# Patient Record
Sex: Male | Born: 1945 | Race: White | Hispanic: No | Smoking: Former smoker
Health system: Southern US, Community
[De-identification: ages and names within clinical notes are randomized; demographics above are authoritative.]

## PROBLEM LIST (undated history)

## (undated) DIAGNOSIS — I639 Cerebral infarction, unspecified: Secondary | ICD-10-CM

## (undated) DIAGNOSIS — F419 Anxiety disorder, unspecified: Secondary | ICD-10-CM

## (undated) DIAGNOSIS — G20 Parkinson's disease: Secondary | ICD-10-CM

## (undated) DIAGNOSIS — J449 Chronic obstructive pulmonary disease, unspecified: Secondary | ICD-10-CM

## (undated) DIAGNOSIS — G20A1 Parkinson's disease without dyskinesia, without mention of fluctuations: Secondary | ICD-10-CM

## (undated) DIAGNOSIS — F32A Depression, unspecified: Secondary | ICD-10-CM

## (undated) DIAGNOSIS — R569 Unspecified convulsions: Secondary | ICD-10-CM

## (undated) DIAGNOSIS — F329 Major depressive disorder, single episode, unspecified: Secondary | ICD-10-CM

## (undated) HISTORY — PX: SPINAL FUSION: SHX223

## (undated) HISTORY — PX: TOTAL KNEE ARTHROPLASTY: SHX125

## (undated) HISTORY — PX: OTHER SURGICAL HISTORY: SHX169

---

## 1999-01-18 ENCOUNTER — Inpatient Hospital Stay (HOSPITAL_COMMUNITY): Admission: EM | Admit: 1999-01-18 | Discharge: 1999-01-19 | Payer: Self-pay | Admitting: Emergency Medicine

## 1999-01-19 ENCOUNTER — Encounter: Payer: Self-pay | Admitting: Internal Medicine

## 1999-01-29 ENCOUNTER — Ambulatory Visit (HOSPITAL_COMMUNITY): Admission: RE | Admit: 1999-01-29 | Discharge: 1999-01-29 | Payer: Self-pay | Admitting: Internal Medicine

## 2001-04-30 ENCOUNTER — Inpatient Hospital Stay (HOSPITAL_COMMUNITY): Admission: RE | Admit: 2001-04-30 | Discharge: 2001-05-01 | Payer: Self-pay | Admitting: Neurosurgery

## 2001-04-30 ENCOUNTER — Encounter: Payer: Self-pay | Admitting: Neurosurgery

## 2008-08-29 DIAGNOSIS — N400 Enlarged prostate without lower urinary tract symptoms: Secondary | ICD-10-CM | POA: Diagnosis present

## 2009-02-01 DIAGNOSIS — F1020 Alcohol dependence, uncomplicated: Secondary | ICD-10-CM | POA: Diagnosis present

## 2013-11-28 DIAGNOSIS — G4733 Obstructive sleep apnea (adult) (pediatric): Secondary | ICD-10-CM | POA: Diagnosis present

## 2014-01-21 ENCOUNTER — Inpatient Hospital Stay: Payer: Self-pay | Admitting: Internal Medicine

## 2014-01-21 LAB — URINALYSIS, COMPLETE
Bacteria: NONE SEEN
Bilirubin,UR: NEGATIVE
Blood: NEGATIVE
Glucose,UR: NEGATIVE mg/dL (ref 0–75)
Ketone: NEGATIVE
Leukocyte Esterase: NEGATIVE
Nitrite: NEGATIVE
Ph: 6 (ref 4.5–8.0)
Protein: NEGATIVE
RBC,UR: <1 /HPF (ref 0–5)
Specific Gravity: 1.006 (ref 1.003–1.030)
Squamous Epithelial: NONE SEEN
WBC UR: 1 /HPF (ref 0–5)

## 2014-01-21 LAB — PROTIME-INR
INR: 1.1
Prothrombin Time: 14.1 secs (ref 11.5–14.7)

## 2014-01-21 LAB — CBC
HCT: 40.4 % (ref 40.0–52.0)
HGB: 13.6 g/dL (ref 13.0–18.0)
MCH: 31.9 pg (ref 26.0–34.0)
MCHC: 33.6 g/dL (ref 32.0–36.0)
MCV: 95 fL (ref 80–100)
Platelet: 168 10*3/uL (ref 150–440)
RBC: 4.25 10*6/uL — ABNORMAL LOW (ref 4.40–5.90)
RDW: 13.5 % (ref 11.5–14.5)
WBC: 7.5 10*3/uL (ref 3.8–10.6)

## 2014-01-21 LAB — COMPREHENSIVE METABOLIC PANEL
Albumin: 3.8 g/dL (ref 3.4–5.0)
Alkaline Phosphatase: 96 U/L
Anion Gap: 5 — ABNORMAL LOW (ref 7–16)
BUN: 14 mg/dL (ref 7–18)
Bilirubin,Total: 0.3 mg/dL (ref 0.2–1.0)
Calcium, Total: 8.7 mg/dL (ref 8.5–10.1)
Chloride: 107 mmol/L (ref 98–107)
Co2: 29 mmol/L (ref 21–32)
Creatinine: 1.24 mg/dL (ref 0.60–1.30)
EGFR (African American): >60
EGFR (Non-African Amer.): >60
Glucose: 143 mg/dL — ABNORMAL HIGH (ref 65–99)
Osmolality: 284 (ref 275–301)
Potassium: 3.8 mmol/L (ref 3.5–5.1)
SGOT(AST): 166 U/L — ABNORMAL HIGH (ref 15–37)
SGPT (ALT): 156 U/L — ABNORMAL HIGH
Sodium: 141 mmol/L (ref 136–145)
Total Protein: 7 g/dL (ref 6.4–8.2)

## 2014-01-21 LAB — TROPONIN I: Troponin-I: <0.02 ng/mL

## 2014-01-21 LAB — ETHANOL: Ethanol: <3 mg/dL

## 2014-01-21 IMAGING — CT CT HEAD WITHOUT CONTRAST
2 series · 16 of 30 positions shown, 20 images · non-contrast
Comparison: None.

CLINICAL DATA: Altered mental status, possible seizure.

EXAM:
CT HEAD WITHOUT CONTRAST
TECHNIQUE: Contiguous axial images were obtained from the base of the skull
through the vertex without intravenous contrast.

[Series 2: head wo · axial · 0.48mm/px · z∈[-142,-4]mm · 13 of 35 slices shown, 17 images (1 of 2)]
[im 3/35  brain]
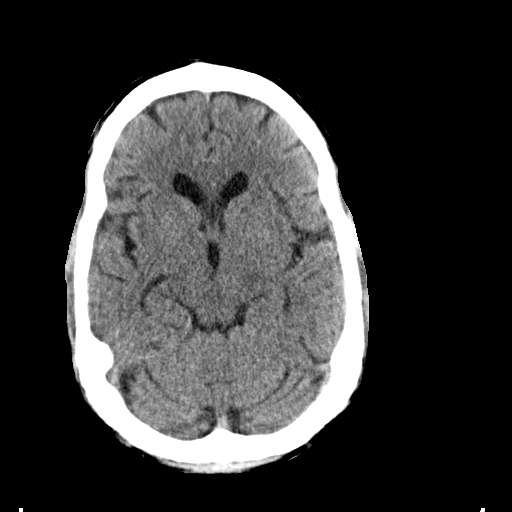
[im 3/35  bone]
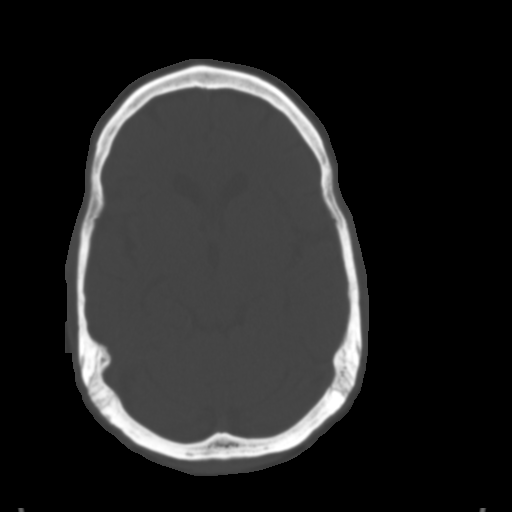
[im 5/35  brain]
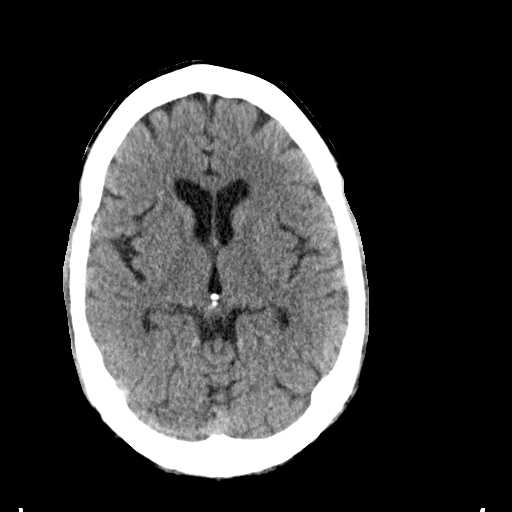
[im 8/35  brain]
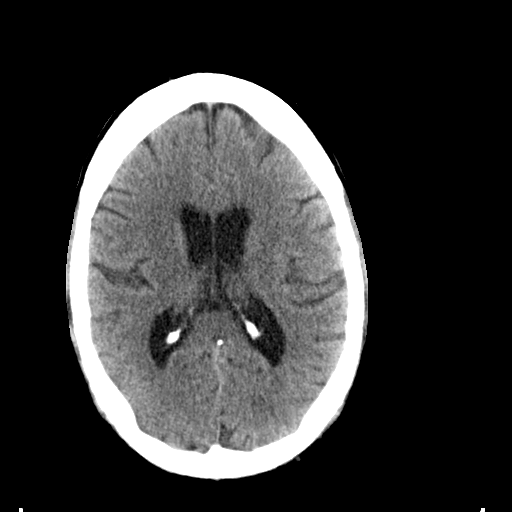
[im 10/35  brain]
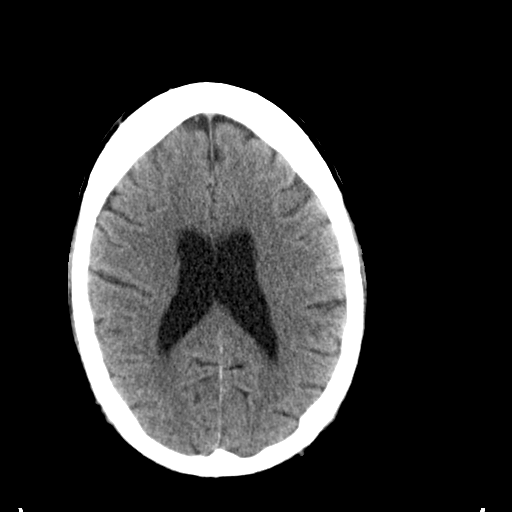
[im 13/35  brain]
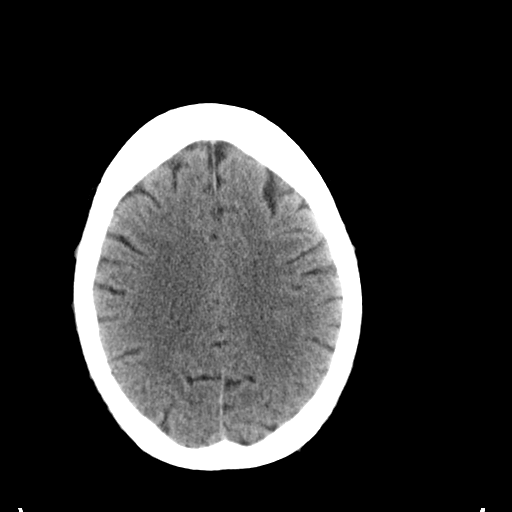
[im 13/35  bone]
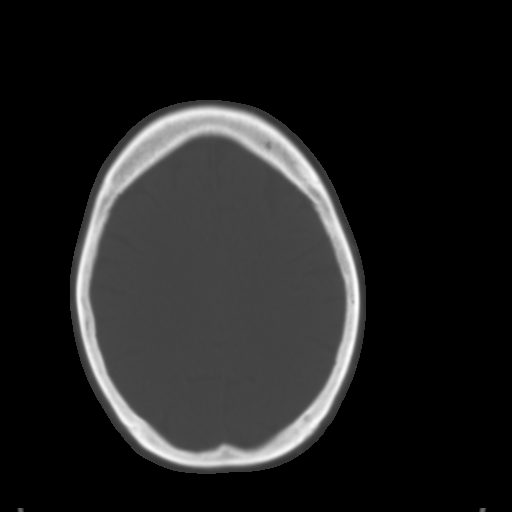
[im 15/35  brain]
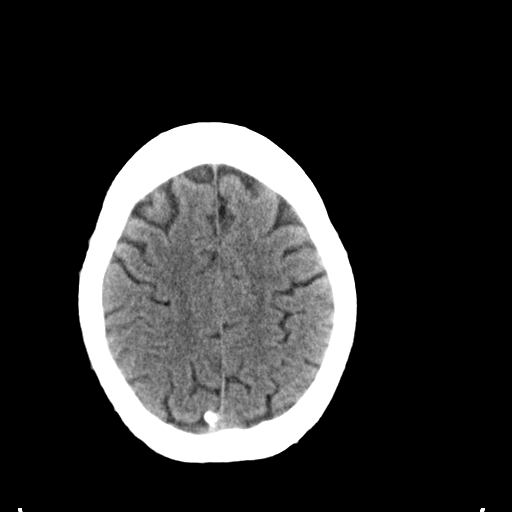
[im 18/35  brain]
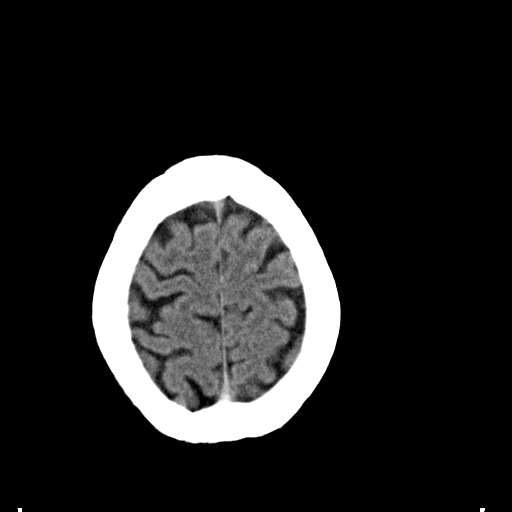
[im 20/35  brain]
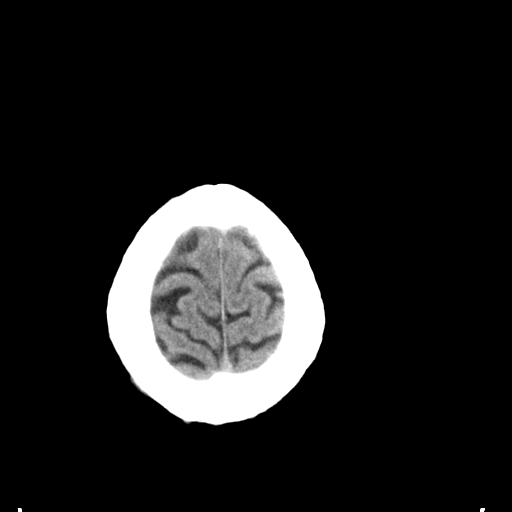
[im 22/35  brain]
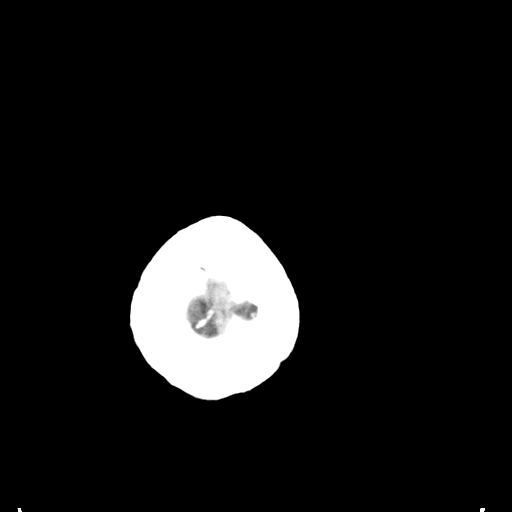
[im 22/35  bone]
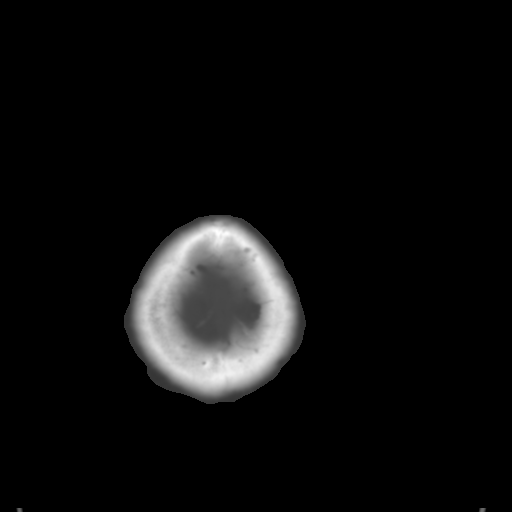
[im 25/35  brain]
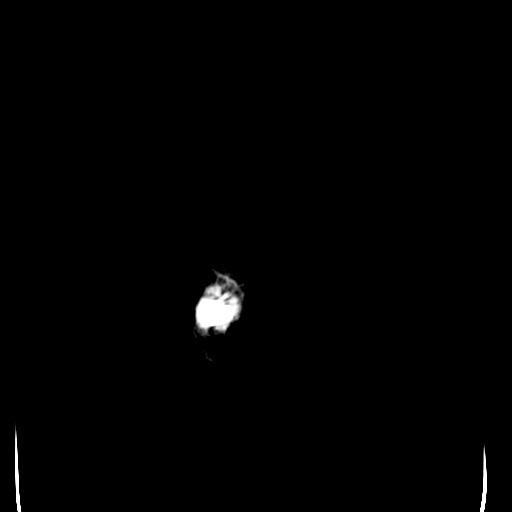
[im 27/35  brain]
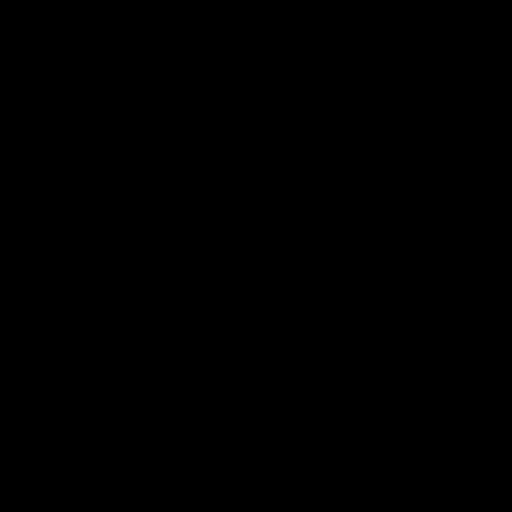
[im 30/35  brain]
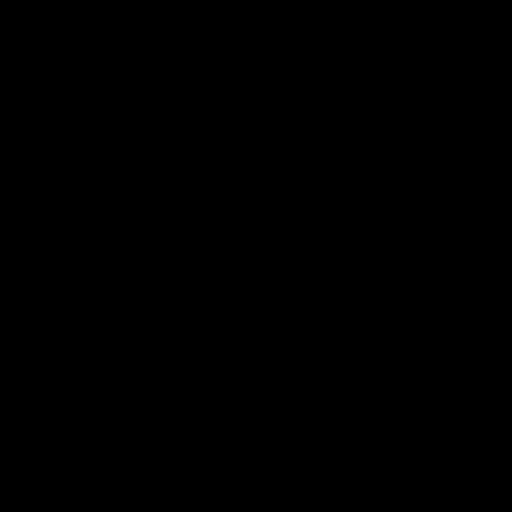
[im 32/35  brain]
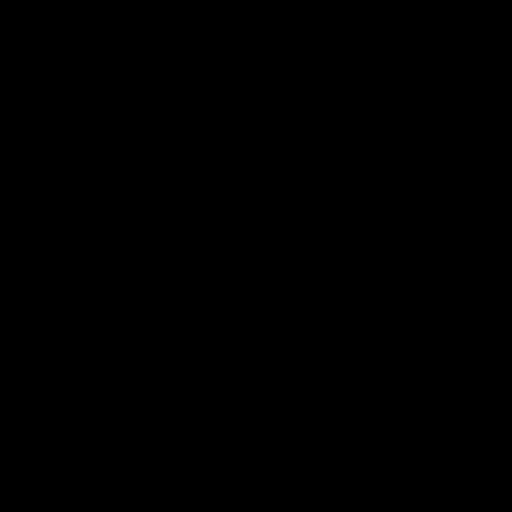
[im 32/35  bone]
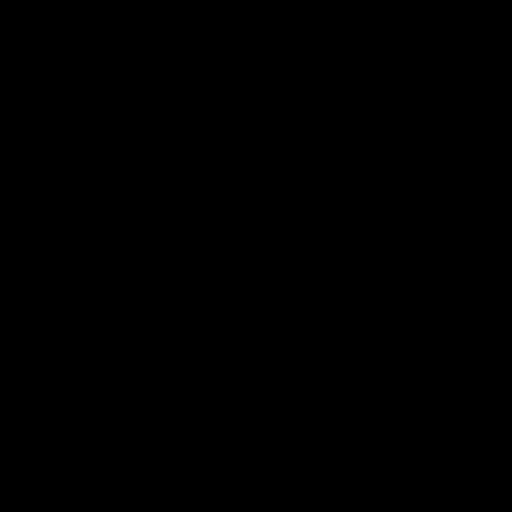

[Series 4: head wo · axial · 0.49mm/px · z∈[-196,-149]mm · 3 of 36 slices shown (2 of 2)]
[im 3/36  brain]
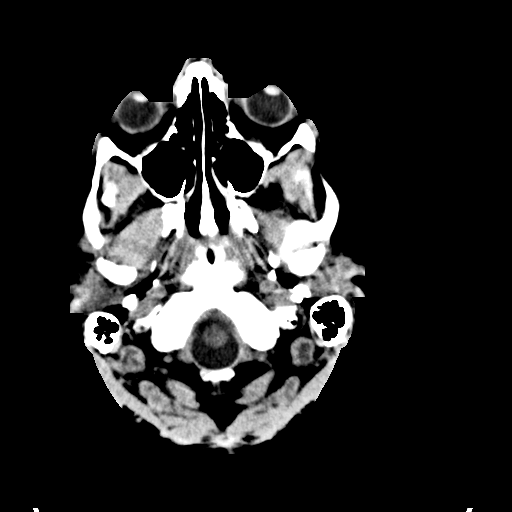
[im 8/36  brain]
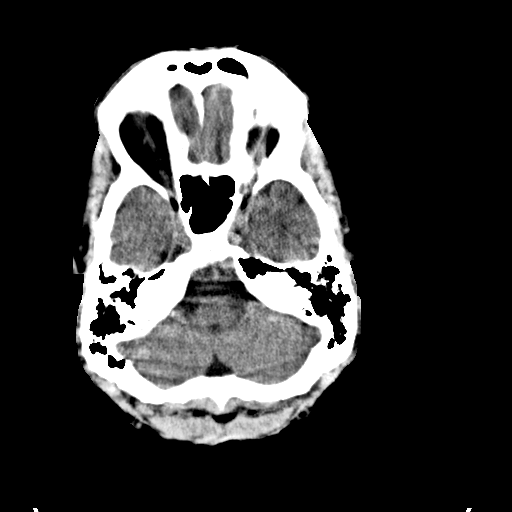
[im 13/36  brain]
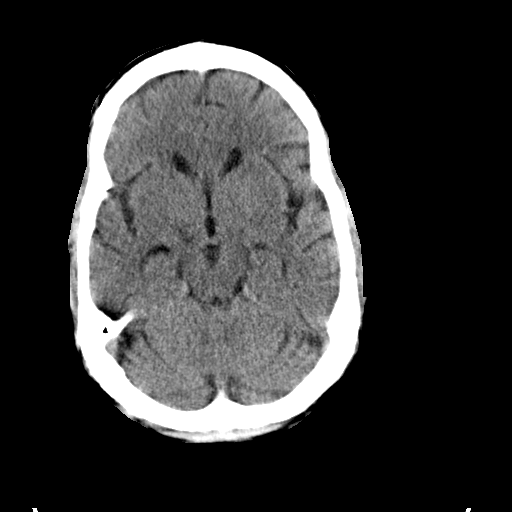

[16 of 30 positions shown; findings below may reference images not displayed]

FINDINGS: No acute intracranial abnormality. Specifically, no hemorrhage,
hydrocephalus, mass lesion, acute infarction, or significant
intracranial injury. No acute calvarial abnormality. Visualized
paranasal sinuses and mastoids clear. Orbital soft tissues
unremarkable.
IMPRESSION: Negative.

## 2014-01-22 LAB — CBC WITH DIFFERENTIAL/PLATELET
BASOS ABS: 0 10*3/uL (ref 0.0–0.1)
BASOS PCT: 0.3 %
EOS PCT: 0.4 %
Eosinophil #: 0 10*3/uL (ref 0.0–0.7)
HCT: 38.2 % — ABNORMAL LOW (ref 40.0–52.0)
HGB: 12.4 g/dL — ABNORMAL LOW (ref 13.0–18.0)
Lymphocyte #: 1.1 10*3/uL (ref 1.0–3.6)
Lymphocyte %: 10.4 %
MCH: 31.1 pg (ref 26.0–34.0)
MCHC: 32.4 g/dL (ref 32.0–36.0)
MCV: 96 fL (ref 80–100)
Monocyte #: 1.1 x10 3/mm — ABNORMAL HIGH (ref 0.2–1.0)
Monocyte %: 10.8 %
Neutrophil #: 8.1 10*3/uL — ABNORMAL HIGH (ref 1.4–6.5)
Neutrophil %: 78.1 %
Platelet: 152 10*3/uL (ref 150–440)
RBC: 3.97 10*6/uL — AB (ref 4.40–5.90)
RDW: 13.1 % (ref 11.5–14.5)
WBC: 10.4 10*3/uL (ref 3.8–10.6)

## 2014-01-22 LAB — CK TOTAL AND CKMB (NOT AT ARMC)
CK, Total: 205 U/L (ref 39–308)
CK, Total: 185 U/L (ref 39–308)
CK-MB: 5.3 ng/mL — ABNORMAL HIGH (ref 0.5–3.6)
CK-MB: 4.7 ng/mL — ABNORMAL HIGH (ref 0.5–3.6)

## 2014-01-22 LAB — BASIC METABOLIC PANEL
Anion Gap: 8 (ref 7–16)
BUN: 14 mg/dL (ref 7–18)
CALCIUM: 8.2 mg/dL — AB (ref 8.5–10.1)
CO2: 24 mmol/L (ref 21–32)
Chloride: 113 mmol/L — ABNORMAL HIGH (ref 98–107)
Creatinine: 0.94 mg/dL (ref 0.60–1.30)
EGFR (African American): >60
EGFR (Non-African Amer.): >60
GLUCOSE: 102 mg/dL — AB (ref 65–99)
Osmolality: 289 (ref 275–301)
POTASSIUM: 3.7 mmol/L (ref 3.5–5.1)
Sodium: 145 mmol/L (ref 136–145)

## 2014-01-22 LAB — HEPATIC FUNCTION PANEL A (ARMC)
Albumin: 3.1 g/dL — ABNORMAL LOW (ref 3.4–5.0)
Alkaline Phosphatase: 75 U/L
Bilirubin, Direct: <0.1 mg/dL (ref 0.0–0.2)
Bilirubin,Total: 0.4 mg/dL (ref 0.2–1.0)
SGOT(AST): 105 U/L — ABNORMAL HIGH (ref 15–37)
SGPT (ALT): 117 U/L — ABNORMAL HIGH
Total Protein: 5.8 g/dL — ABNORMAL LOW (ref 6.4–8.2)

## 2014-01-22 LAB — TROPONIN I: TROPONIN-I: 0.04 ng/mL

## 2014-01-22 LAB — AMMONIA: Ammonia, Plasma: 31 mcmol/L (ref 11–32)

## 2014-01-22 LAB — PHENYTOIN LEVEL, TOTAL: Dilantin: 10.4 ug/mL (ref 10.0–20.0)

## 2014-01-22 LAB — TSH: THYROID STIMULATING HORM: 2.33 u[IU]/mL

## 2014-01-22 LAB — CK: CK, Total: 255 U/L (ref 39–308)

## 2014-01-23 LAB — HEPATIC FUNCTION PANEL A (ARMC)
Albumin: 3 g/dL — ABNORMAL LOW (ref 3.4–5.0)
Alkaline Phosphatase: 69 U/L
Bilirubin, Direct: <0.1 mg/dL (ref 0.0–0.2)
Bilirubin,Total: 0.6 mg/dL (ref 0.2–1.0)
SGOT(AST): 84 U/L — ABNORMAL HIGH (ref 15–37)
SGPT (ALT): 86 U/L — ABNORMAL HIGH
Total Protein: 5.7 g/dL — ABNORMAL LOW (ref 6.4–8.2)

## 2014-01-23 LAB — CK TOTAL AND CKMB (NOT AT ARMC)
CK, Total: 193 U/L (ref 39–308)
CK-MB: 2.8 ng/mL (ref 0.5–3.6)

## 2014-01-23 LAB — TROPONIN I

## 2014-01-23 LAB — HEMOGLOBIN: HGB: 12.8 g/dL — ABNORMAL LOW (ref 13.0–18.0)

## 2014-01-23 IMAGING — NM NM LUNG SCAN
2 series · 16 of 16 positions shown · non-contrast
Comparison: Chest radiograph [DATE].

CLINICAL DATA: Hypoxia.

EXAM:
NUCLEAR MEDICINE VENTILATION - PERFUSION LUNG SCAN
TECHNIQUE: Ventilation images were obtained in multiple projections using
inhaled aerosol technetium 99 M DTPA. Perfusion images were obtained
in multiple projections after intravenous injection of [UH] MAA.
RADIOPHARMACEUTICALS:  40.5 mCi [UH] DTPA aerosol and 4.0 mCi
[UH] MAA

[Series 1000: lung perfusion · 1.95mm/px · 4 acquisitions, 8 frames shown]
[im 1/4]
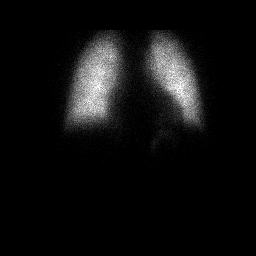
[im 1/4]
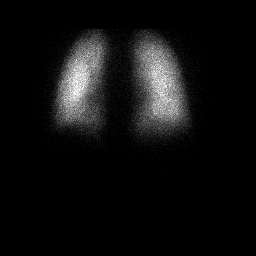
[im 2/4]
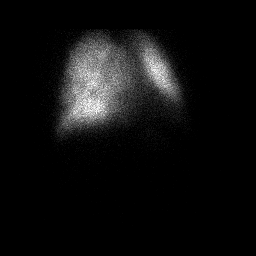
[im 2/4]
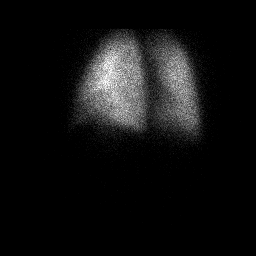
[im 3/4]
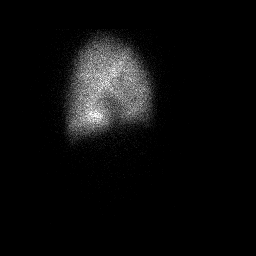
[im 3/4]
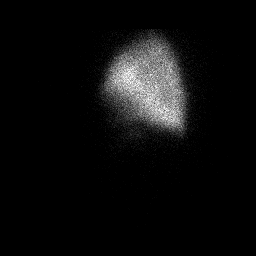
[im 4/4]
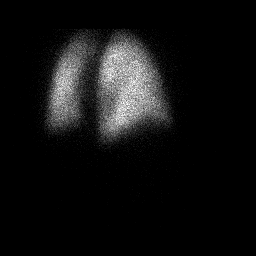
[im 4/4]
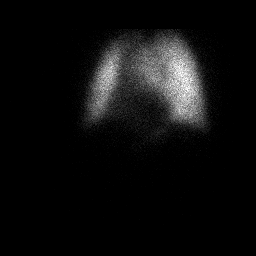

[Series 1000: lung ventilation · 3.90mm/px · 4 acquisitions, 8 frames shown]
[im 1/4]
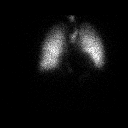
[im 1/4]
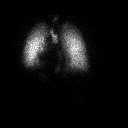
[im 2/4]
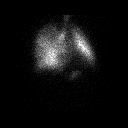
[im 2/4]
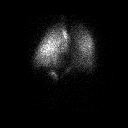
[im 3/4  full-range]
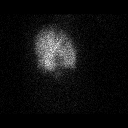
[im 3/4  full-range]
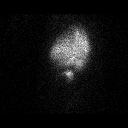
[im 4/4]
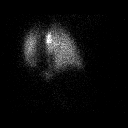
[im 4/4]
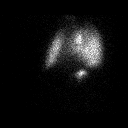

[16 of 16 positions shown; findings below may reference images not displayed]

FINDINGS: Ventilation: Mild patchiness without a focal ventilation defect.

Perfusion: Mild patchiness but no wedge shaped peripheral perfusion
defects to suggest acute pulmonary embolism.
IMPRESSION: Mild patchiness on ventilation and perfusion imaging is likely
related to COPD. No definite pulmonary embolus.

## 2014-01-23 IMAGING — CR DG CHEST 2V
1 series · 4 of 4 positions shown · non-contrast
Comparison: None.

CLINICAL DATA: Hypoxia.

EXAM:
CHEST  2 VIEW

[Series 1: dxr chest pa (or ap) and lateral · 0.14mm/px · 4 of 4 slices shown]
[im 1/4]
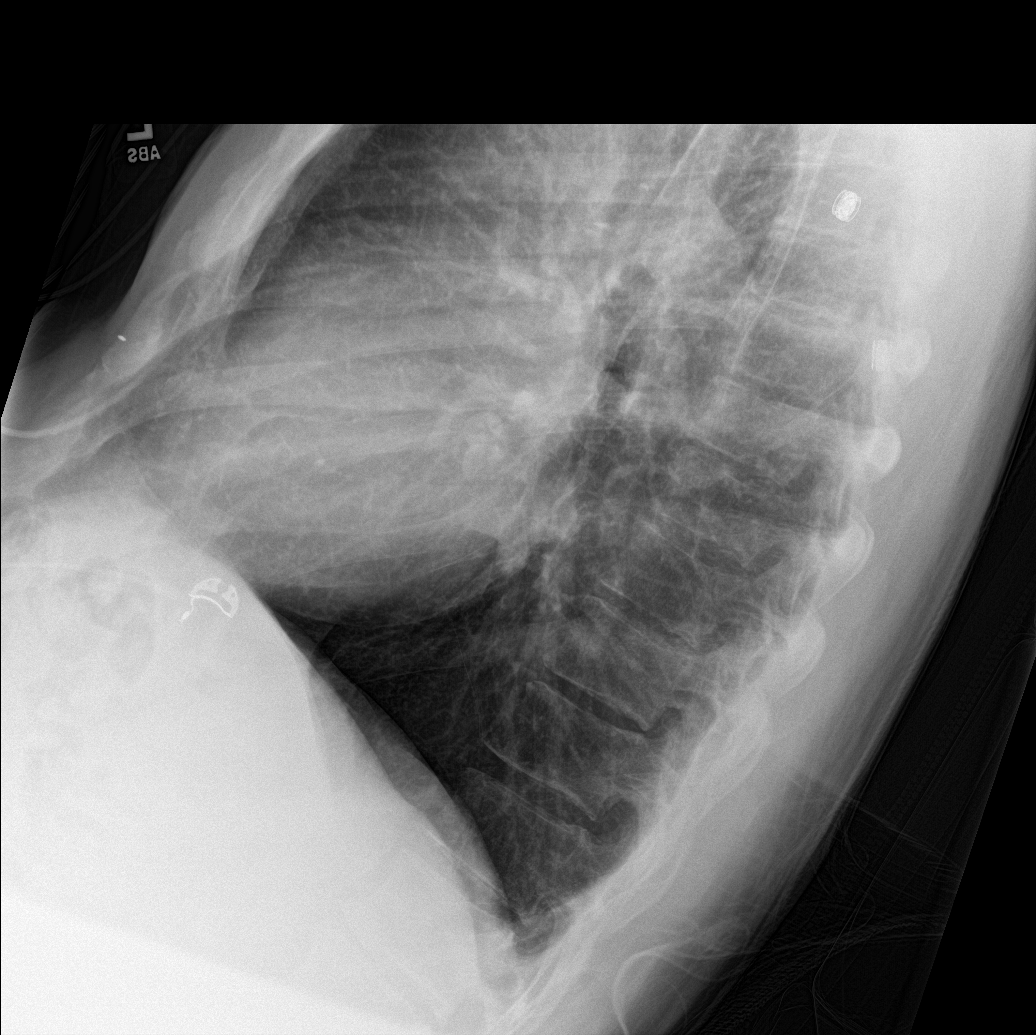
[im 2/4]
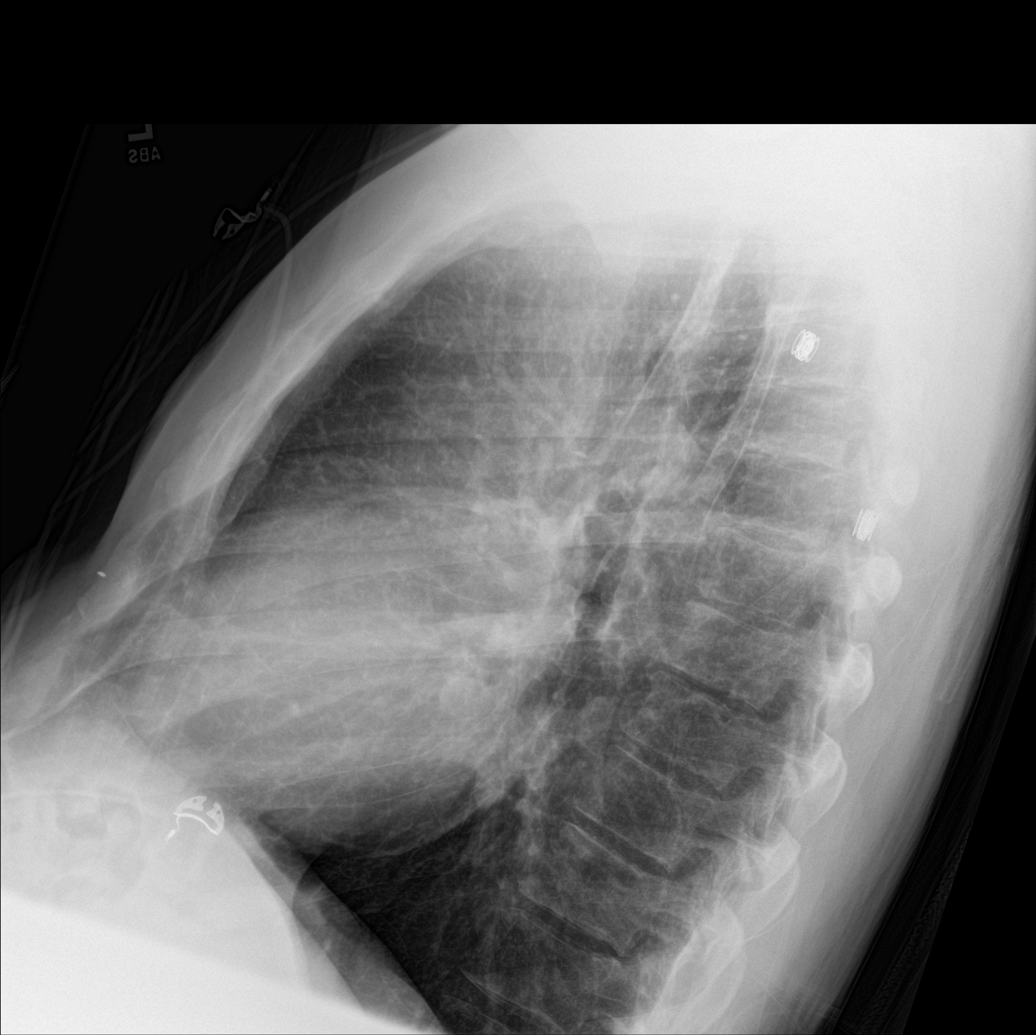
[im 3/4]
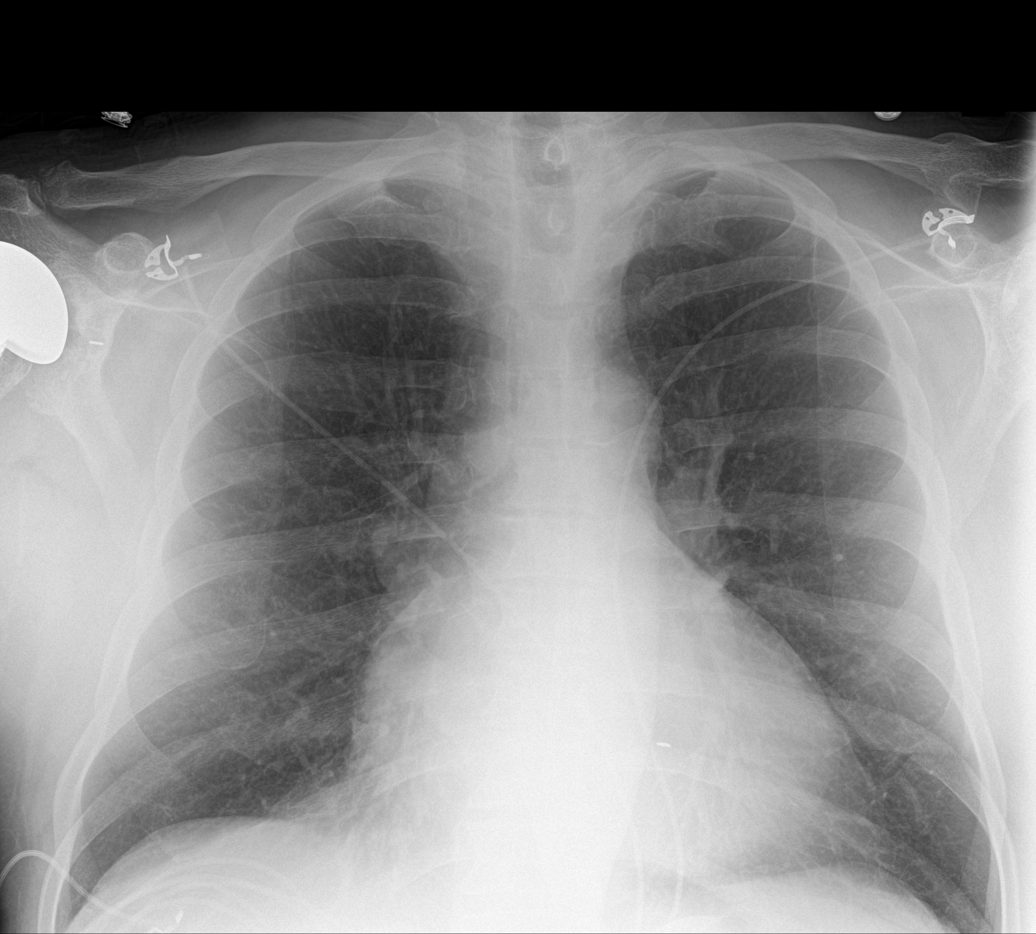
[im 4/4]
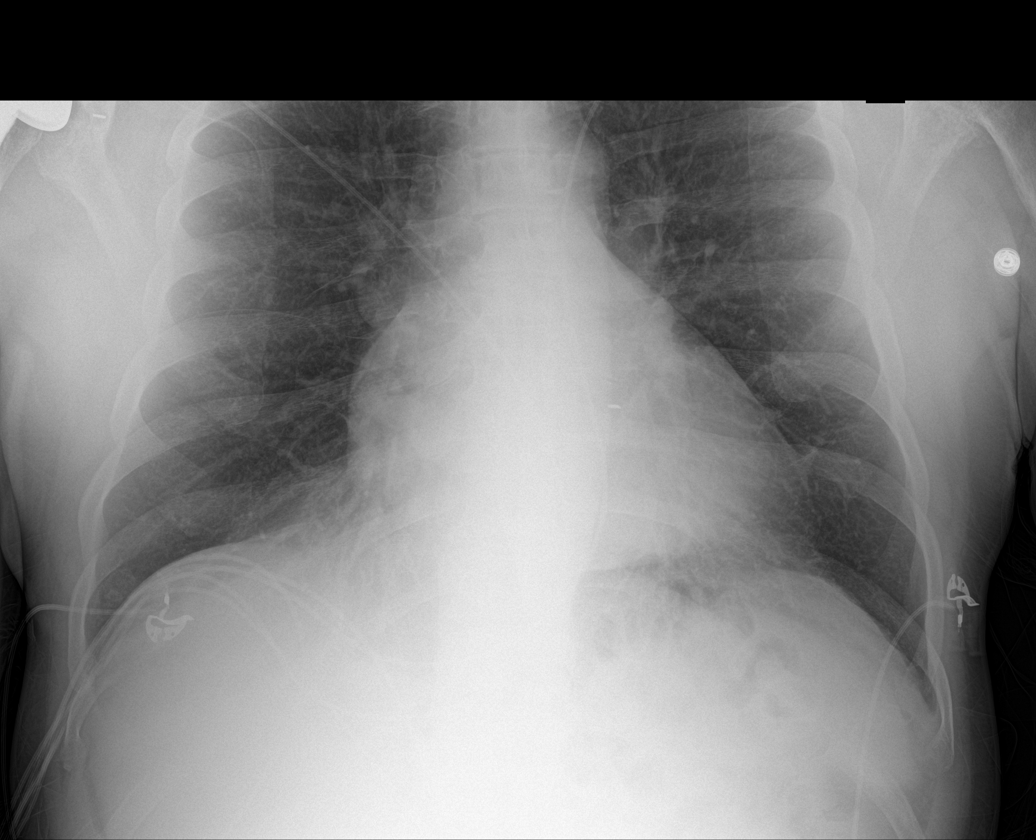

[4 of 4 positions shown; findings below may reference images not displayed]

FINDINGS: The cardiac silhouette is mildly enlarged. There is a 6 mm
radiopaque foreign body projecting in the anterior chest wall left
of midline. The lungs are hyperinflated. No confluent airspace
opacity, pulmonary edema, pleural effusion, or pneumothorax is
identified. Prior right shoulder arthroplasty is partially
visualized.
IMPRESSION: Hyperinflation without evidence of active cardiopulmonary disease.

## 2014-01-23 IMAGING — US ABDOMEN ULTRASOUND LIMITED
1 series · 14 of 25 positions shown · non-contrast
Comparison: None.

CLINICAL DATA: Elevated serum transaminase levels.

EXAM:
US ABDOMEN LIMITED - RIGHT UPPER QUADRANT

[Series 1: abdomen ultrasound limited · 0.28mm/px · 14 of 54 slices shown]
[im 1/54]
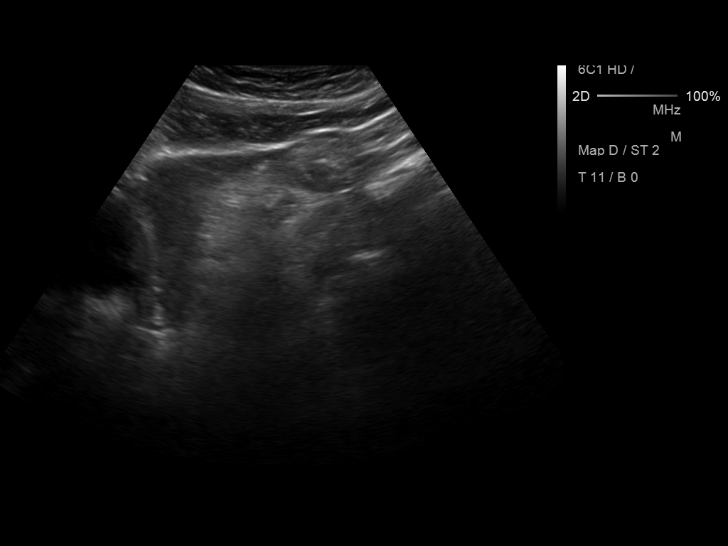
[im 5/54]
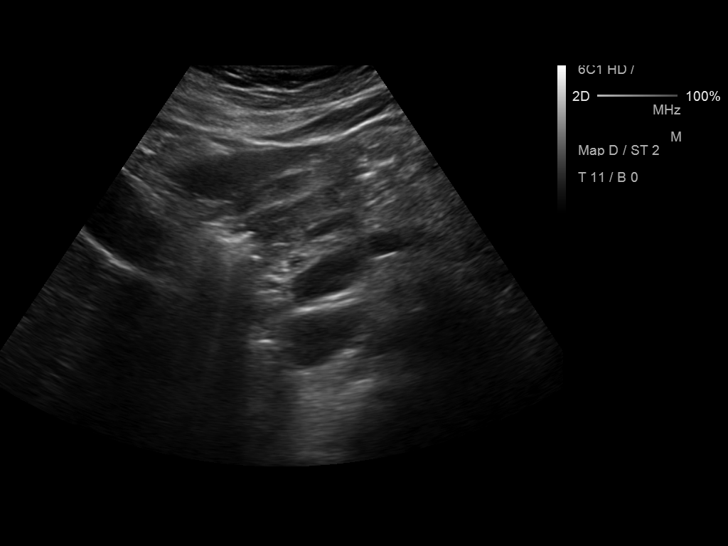
[im 9/54]
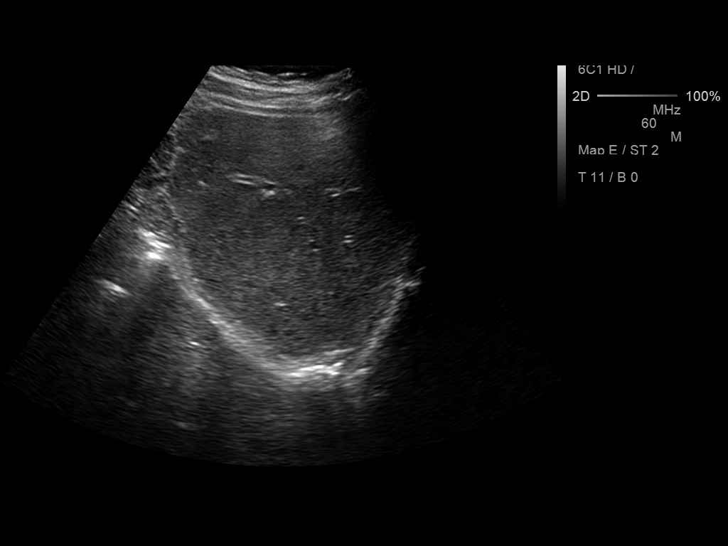
[im 14/54]
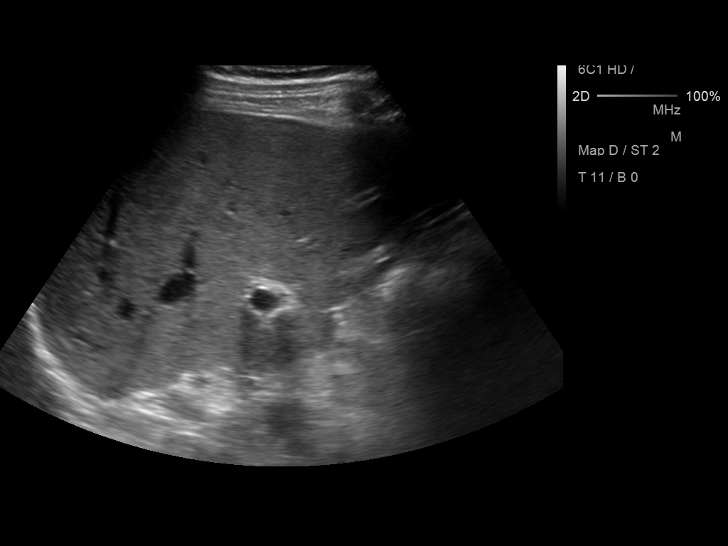
[im 18/54]
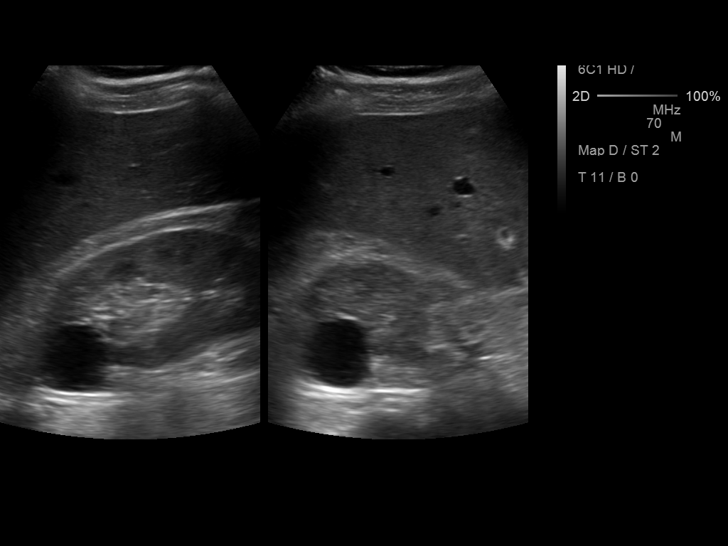
[im 20/54]
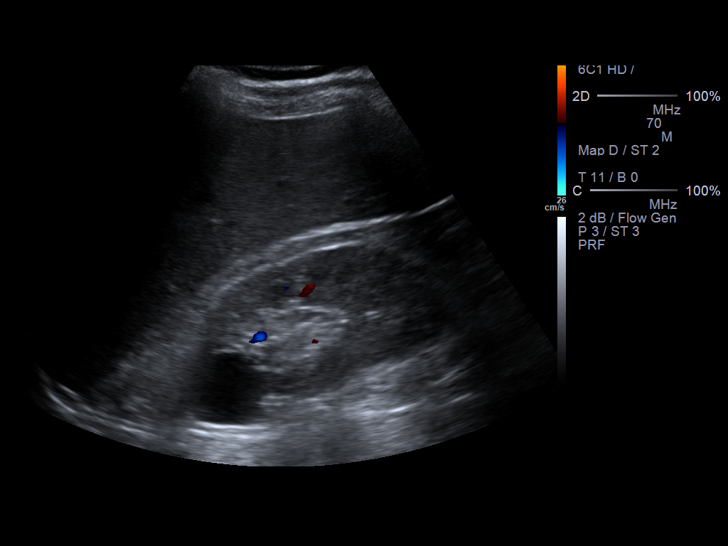
[im 25/54]
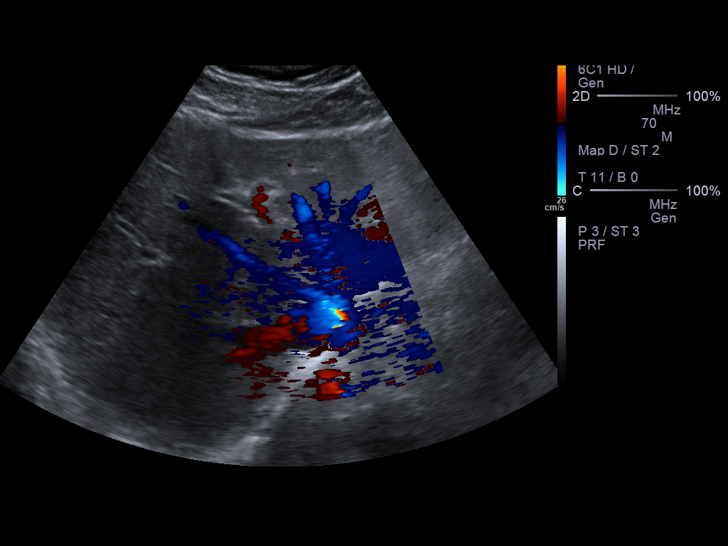
[im 29/54]
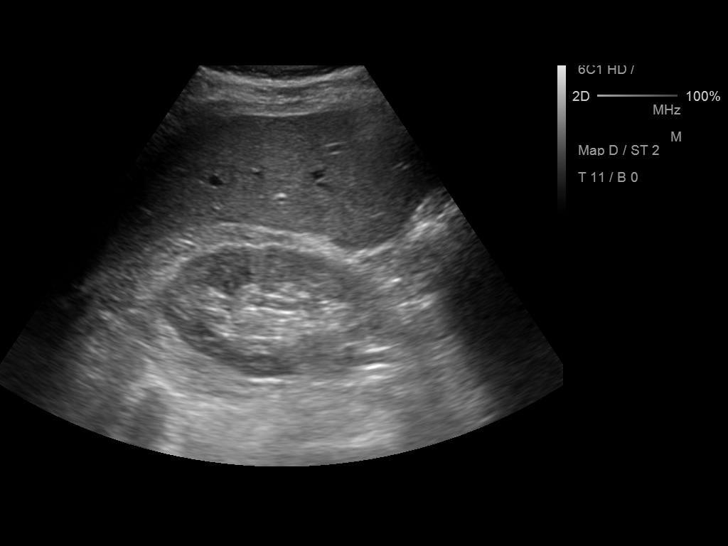
[im 34/54]
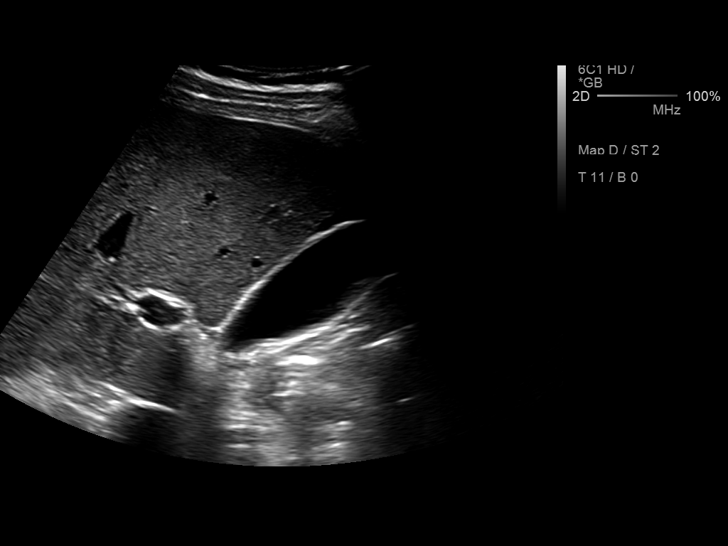
[im 36/54]
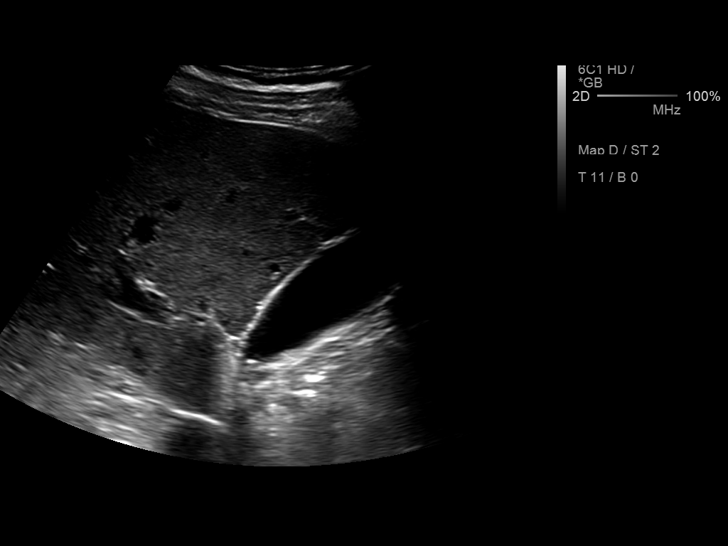
[im 40/54]
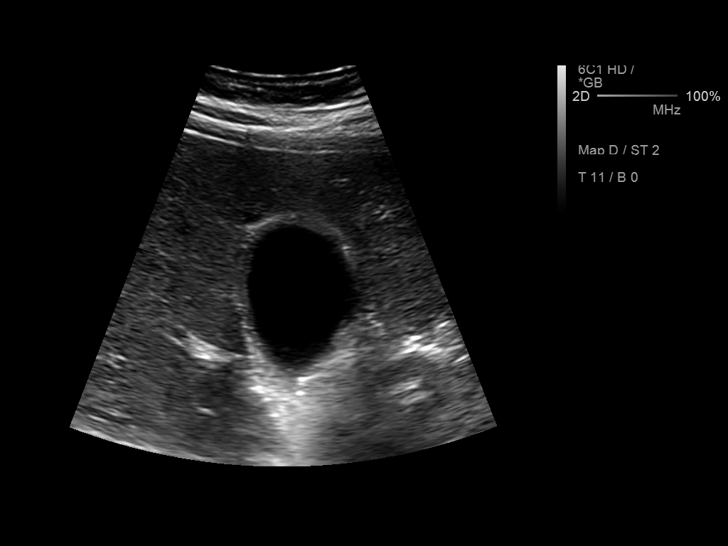
[im 45/54]
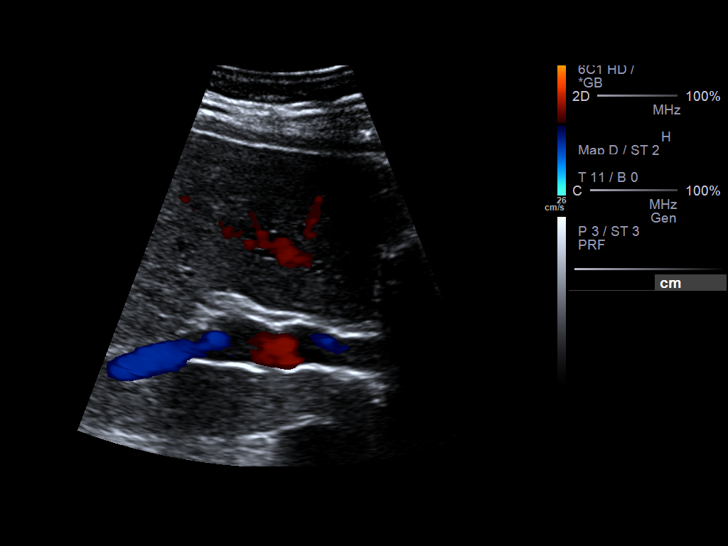
[im 49/54]
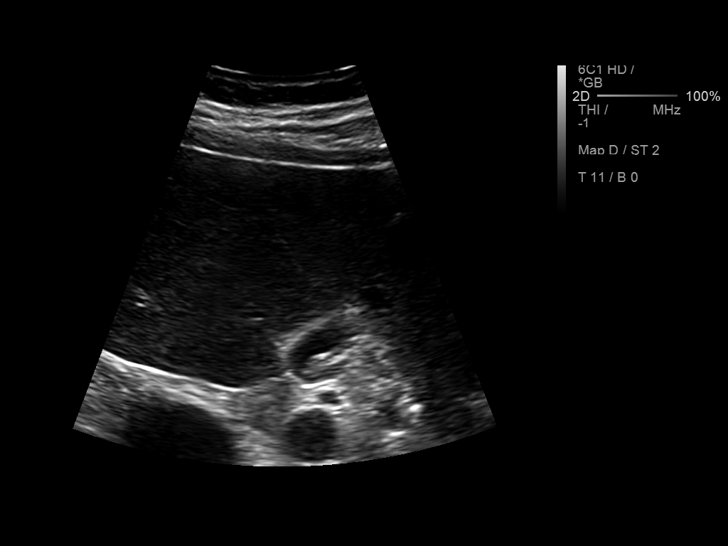
[im 54/54]
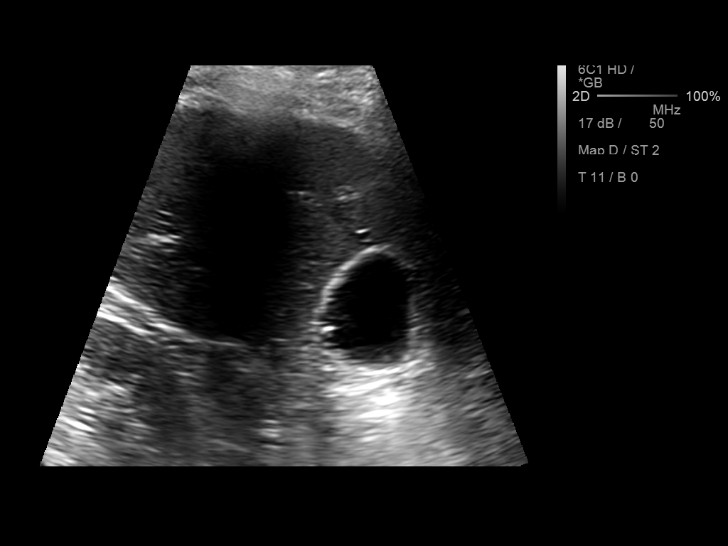

[14 of 25 positions shown; findings below may reference images not displayed]

FINDINGS: Gallbladder:

Multiple nonmobile echogenic foci involving the gallbladder wall,
the largest approximating 4 mm at the gallbladder neck. No shadowing
gallstones. No gallbladder wall thickening or pericholecystic fluid.
Negative sonographic Murphy sign according to the ultrasound
technologist.

Common bile duct:

Diameter: Approximately 3 mm.

Liver:

Normal size and echotexture without focal parenchymal abnormality.
Patent portal vein with hepatopetal flow.

Other findings:

3 cm simple cyst arising from the upper pole of the right kidney.
IMPRESSION: 1. Multiple gallbladder polyps. No evidence of cholelithiasis or
cholecystitis.
2. Incidental 3 cm simple cyst arising from the upper pole of the
right kidney.
3. Otherwise normal examination.

## 2014-02-04 ENCOUNTER — Emergency Department: Payer: Self-pay | Admitting: Emergency Medicine

## 2014-02-04 LAB — DRUG SCREEN, URINE

## 2014-02-04 LAB — CBC
HCT: 45.2 % (ref 40.0–52.0)
HGB: 14.7 g/dL (ref 13.0–18.0)
MCH: 31.1 pg (ref 26.0–34.0)
MCHC: 32.5 g/dL (ref 32.0–36.0)
MCV: 96 fL (ref 80–100)
PLATELETS: 167 10*3/uL (ref 150–440)
RBC: 4.72 10*6/uL (ref 4.40–5.90)
RDW: 13.3 % (ref 11.5–14.5)
WBC: 8.1 10*3/uL (ref 3.8–10.6)

## 2014-02-04 LAB — URINALYSIS, COMPLETE
Bacteria: NONE SEEN
Bilirubin,UR: NEGATIVE
Blood: NEGATIVE
Glucose,UR: NEGATIVE mg/dL (ref 0–75)
Ketone: NEGATIVE
LEUKOCYTE ESTERASE: NEGATIVE
NITRITE: NEGATIVE
PH: 6 (ref 4.5–8.0)
Protein: NEGATIVE
RBC,UR: NONE SEEN /HPF (ref 0–5)
Specific Gravity: 1.004 (ref 1.003–1.030)
Squamous Epithelial: NONE SEEN
WBC UR: <1 /HPF (ref 0–5)

## 2014-02-04 LAB — COMPREHENSIVE METABOLIC PANEL
ALBUMIN: 3.9 g/dL (ref 3.4–5.0)
Alkaline Phosphatase: 78 U/L
Anion Gap: 7 (ref 7–16)
BILIRUBIN TOTAL: 0.3 mg/dL (ref 0.2–1.0)
BUN: 15 mg/dL (ref 7–18)
CREATININE: 1.12 mg/dL (ref 0.60–1.30)
Calcium, Total: 8.6 mg/dL (ref 8.5–10.1)
Chloride: 107 mmol/L (ref 98–107)
Co2: 29 mmol/L (ref 21–32)
EGFR (African American): >60
EGFR (Non-African Amer.): >60
GLUCOSE: 97 mg/dL (ref 65–99)
OSMOLALITY: 286 (ref 275–301)
Potassium: 4 mmol/L (ref 3.5–5.1)
SGOT(AST): 58 U/L — ABNORMAL HIGH (ref 15–37)
SGPT (ALT): 42 U/L
SODIUM: 143 mmol/L (ref 136–145)
Total Protein: 7.1 g/dL (ref 6.4–8.2)

## 2014-02-04 LAB — TROPONIN I: Troponin-I: <0.02 ng/mL

## 2014-02-05 LAB — TSH: Thyroid Stimulating Horm: 2.37 u[IU]/mL

## 2014-02-05 LAB — ACETAMINOPHEN LEVEL

## 2014-02-05 LAB — SALICYLATE LEVEL: Salicylates, Serum: <1.7 mg/dL

## 2014-02-05 LAB — ETHANOL

## 2014-02-15 ENCOUNTER — Emergency Department: Payer: Self-pay | Admitting: Emergency Medicine

## 2014-02-15 LAB — COMPREHENSIVE METABOLIC PANEL
ALK PHOS: 71 U/L
AST: 76 U/L — AB (ref 15–37)
Albumin: 3.7 g/dL (ref 3.4–5.0)
Anion Gap: 5 — ABNORMAL LOW (ref 7–16)
BUN: 17 mg/dL (ref 7–18)
Bilirubin,Total: 0.2 mg/dL (ref 0.2–1.0)
Calcium, Total: 8.9 mg/dL (ref 8.5–10.1)
Chloride: 110 mmol/L — ABNORMAL HIGH (ref 98–107)
Co2: 28 mmol/L (ref 21–32)
Creatinine: 1.09 mg/dL (ref 0.60–1.30)
EGFR (African American): >60
EGFR (Non-African Amer.): >60
Glucose: 103 mg/dL — ABNORMAL HIGH (ref 65–99)
Osmolality: 287 (ref 275–301)
Potassium: 4.3 mmol/L (ref 3.5–5.1)
SGPT (ALT): 58 U/L
Sodium: 143 mmol/L (ref 136–145)
Total Protein: 6.8 g/dL (ref 6.4–8.2)

## 2014-02-15 LAB — CBC
HCT: 42.2 % (ref 40.0–52.0)
HGB: 14 g/dL (ref 13.0–18.0)
MCH: 31.2 pg (ref 26.0–34.0)
MCHC: 33.1 g/dL (ref 32.0–36.0)
MCV: 94 fL (ref 80–100)
Platelet: 160 10*3/uL (ref 150–440)
RBC: 4.48 10*6/uL (ref 4.40–5.90)
RDW: 13.2 % (ref 11.5–14.5)
WBC: 5.9 10*3/uL (ref 3.8–10.6)

## 2014-02-15 LAB — SALICYLATE LEVEL: Salicylates, Serum: <1.7 mg/dL

## 2014-02-15 LAB — ACETAMINOPHEN LEVEL: Acetaminophen: <2 ug/mL

## 2014-02-15 LAB — ETHANOL

## 2014-03-07 ENCOUNTER — Emergency Department: Payer: Self-pay | Admitting: Student

## 2014-03-07 IMAGING — CT CT HEAD WITHOUT CONTRAST
1 series · 16 of 30 positions shown, 20 images · non-contrast
Comparison: [DATE]

CLINICAL DATA: anxiety attack accompanied by son. Pt has a h/o
same. Pt has had Ativan 1mg an hour apart with no improvement. Pt
cannot answer questions app. diff with speech. Pt has a h/o same and
comes to ED for Ativan and Geodon and is d/c. hx: HTN, SZ, CVA.

EXAM:
CT HEAD WITHOUT CONTRAST
TECHNIQUE: Contiguous axial images were obtained from the base of the skull
through the vertex without intravenous contrast.

[Series 2: head wo · axial · 0.45mm/px · z∈[+240,+384]mm · 16 of 36 slices shown, 20 images]
[im 2/36  brain]
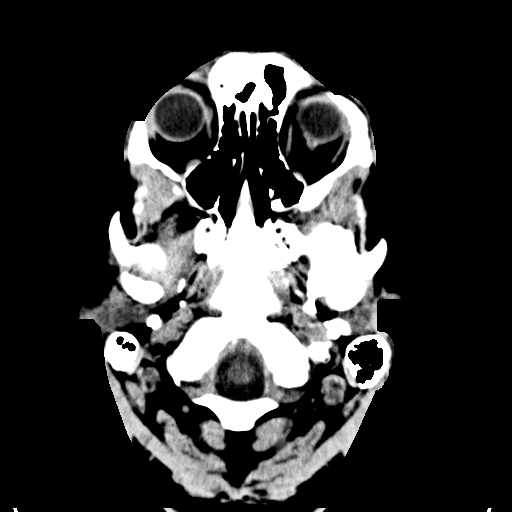
[im 2/36  bone]
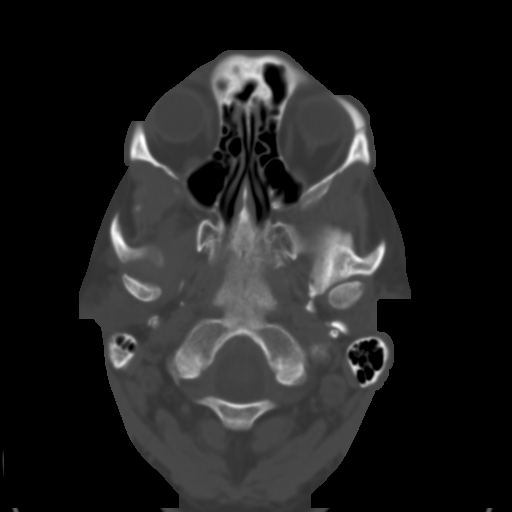
[im 4/36  brain]
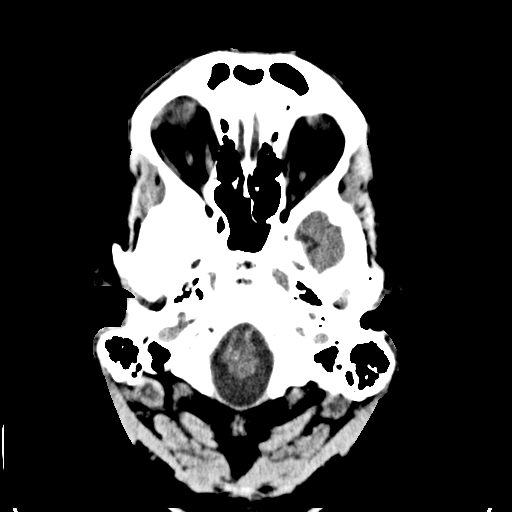
[im 7/36  brain]
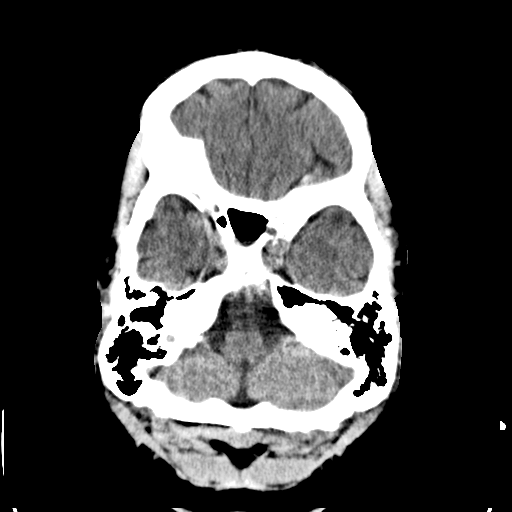
[im 9/36  brain]
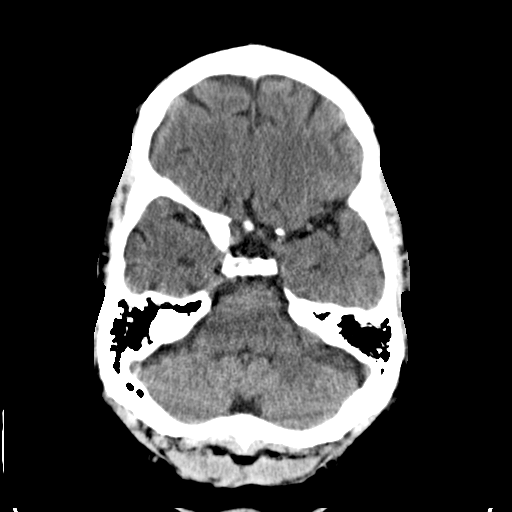
[im 10/36  brain]
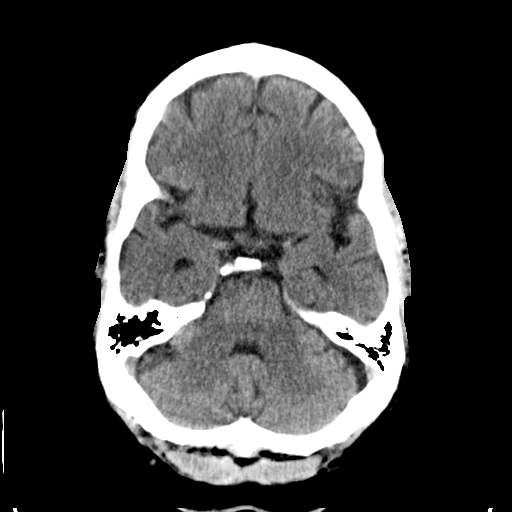
[im 10/36  bone]
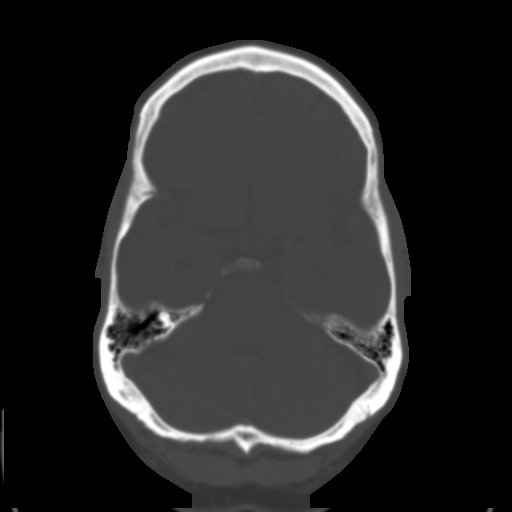
[im 13/36  brain]
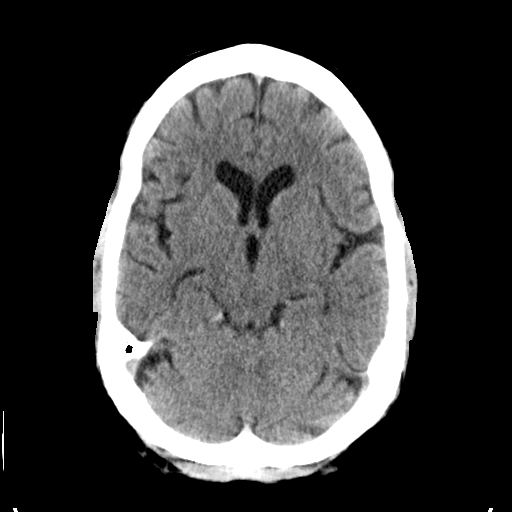
[im 15/36  brain]
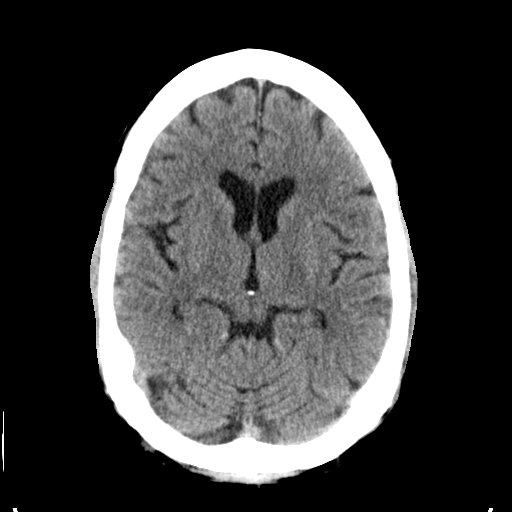
[im 17/36  brain]
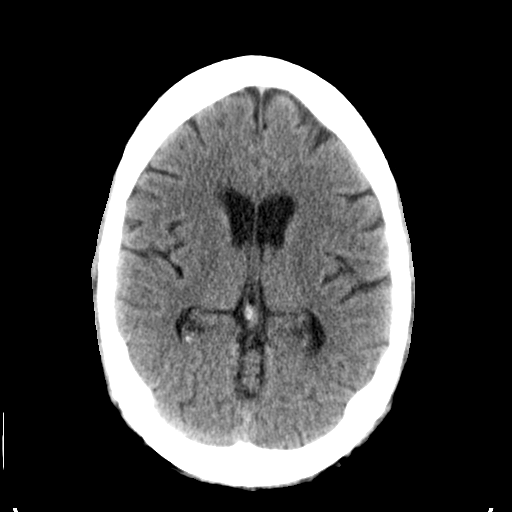
[im 19/36  brain]
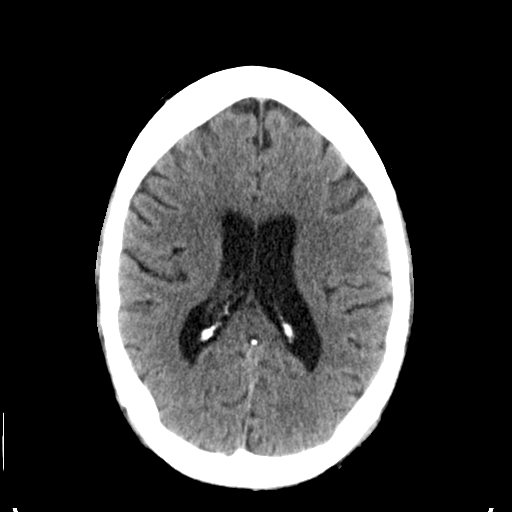
[im 19/36  bone]
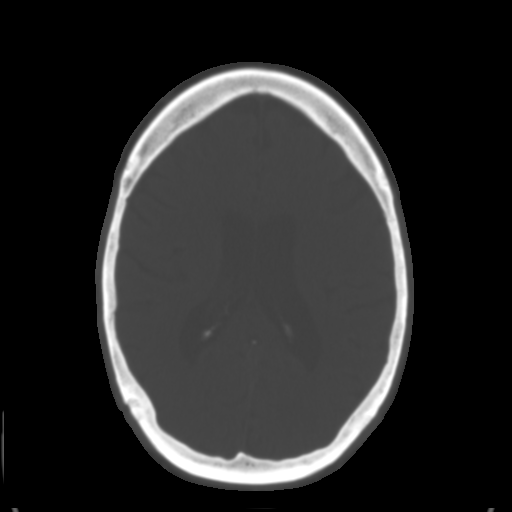
[im 21/36  brain]
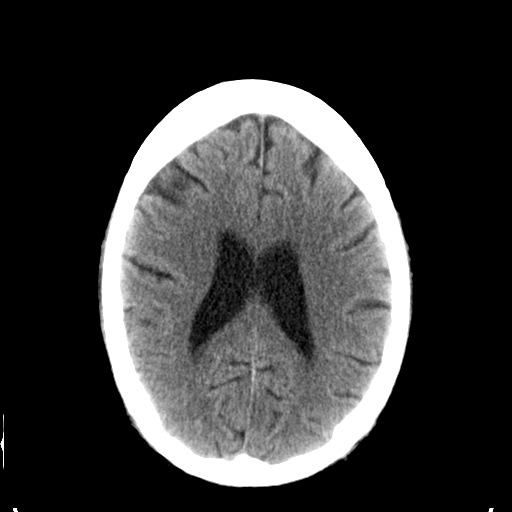
[im 23/36  brain]
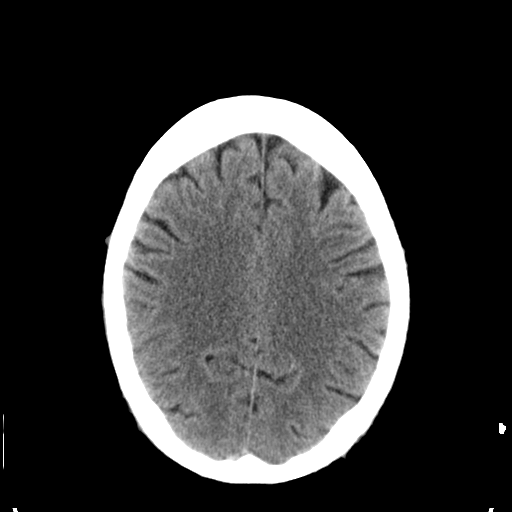
[im 26/36  brain]
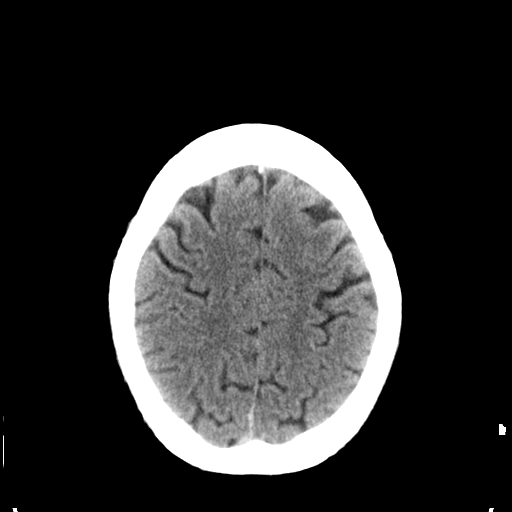
[im 27/36  brain]
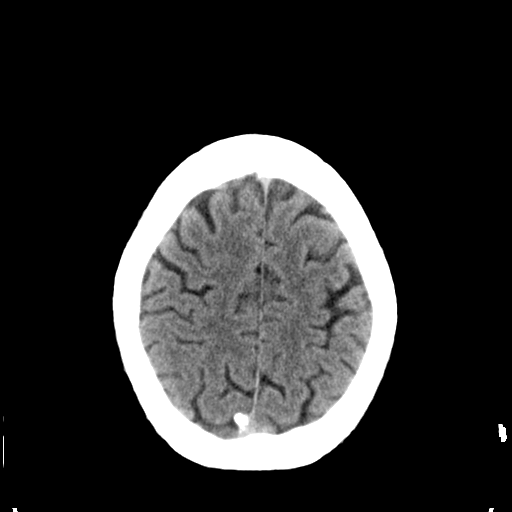
[im 27/36  bone]
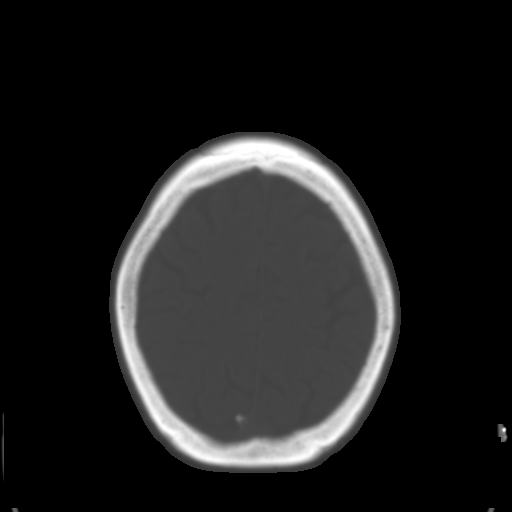
[im 29/36  brain]
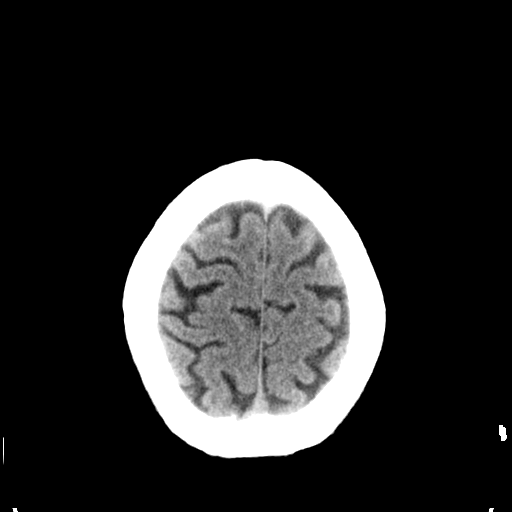
[im 32/36  brain]
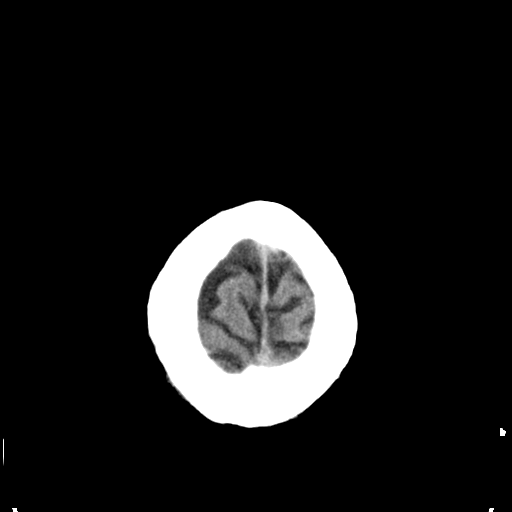
[im 34/36  brain]
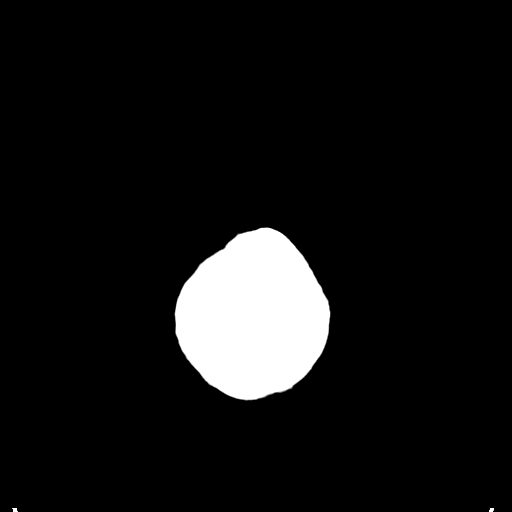

[16 of 30 positions shown; findings below may reference images not displayed]

FINDINGS: Atherosclerotic and physiologic intracranial calcifications. Mild
atrophy. There is no evidence of acute intracranial hemorrhage,
brain edema, mass lesion, acute infarction, mass effect, or midline
shift. Acute infarct may be inapparent on noncontrast CT. No other
intra-axial abnormalities are seen, and the ventricles and sulci are
within normal limits in size and symmetry. No abnormal extra-axial
fluid collections or masses are identified. No significant calvarial
abnormality.
IMPRESSION: 1. Negative for bleed or other acute intracranial process.

## 2014-05-20 NOTE — H&P (Signed)
PATIENT NAME:  Ian Gill, Ian Gill MR#:  [id number] DATE OF BIRTH:  1945-05-19  DATE OF ADMISSION:  01/21/2014  PRIMARY CARE PHYSICIAN: Nonlocal.  REFERRING PHYSICIAN: Kathreen Devoid. Paduchowski, MD  CHIEF COMPLAINT: Altered mental status.  HISTORY OF PRESENT ILLNESS: Ian Gill is a 69 year old male with a history of hypertension, hyperlipidemia, history of seizures since his childhood, had a recent stroke about 2-1/2 months back while living in [place]. The patient was taken to the local hospital, where the patient was diagnosed with a CVA. The patient's family does not know the details of the stroke. During that time, the patient had an episode of seizures. The patient was started on Keppra and Dilantin. The patient started having wobbly gait. Concerning this, the patient was taken back to the Emergency Department. The patient was found to have Dilantin toxicity. The patient was taken off of Dilantin and the patient was moved to live with his family in the [place] area. The patient is noted to have some mild memory issues; however, has been functioning well. In his usual state of health until today, 6 in the evening, when the patient started to complain of not feeling well. Found to be somewhat confused. Concerning this, the patient was brought to the Emergency Department. CT, head, without contrast was unremarkable. The patient has mild elevation of the LFTs of 150s and 160s. The patient unable to explain the discomfort; however, seems to be extremely uncomfortable. When patient came to the Emergency Department, received 1 mg of Ativan and continues to be uncomfortable, restless, holding his head. No obvious signs of infection are noted. The patient does not have any neurological deficits. Concerning this, the patient was admitted to medicine service and while transferring patient to the floor, patient had an episode of seizures and became completely unresponsive. Patient was brought back to the Emergency  Department. The patient had a tongue bite, was extremely agitated, required 3 mg of Ativan, 5 mg of Haldol, and was given 1 g of phenytoin, with improvement with the agitation. Per family, the patient has been on bupropion for unknown period of time. Per family, patient did not have any seizures in between for many years. However, in the last 5 years, patient started to have multiple episodes of seizures. As mentioned above, we do not have the duration. Patient is on buspirone.   PAST MEDICAL HISTORY: 1.  Gastroesophageal reflux disease. 2.  Hypertension. 3.  Depression.  4.  Seizures. 5.  History of CVA. 6.  Hyperlipidemia.  PAST SURGICAL HISTORY: 1.  Right shoulder surgery. 2.  Spinal fusion. 3.  Left knee surgery.  ALLERGIES: 1.  WELLBUTRIN. 2.  IVP DYE.   HOME MEDICATIONS: 1.  Venlafaxine extended release 75 mg daily.  2.  Ranitidine 150 mg daily. 3.  Quetiapine 25 mg daily. 4.  Multivitamin 1 tablet daily. 5.  Mirtazapine 15 mg daily. 6.  Keppra 1000 mg 2 times a day. 7.  Fish oil 1200 mg 2 times a day. 8.  Enalapril 20 mg once a day. 9.  Plavix 75 mg once a day. 10.  Clonazepam 0.5 mg 2 times a day. 11.  Cholecalciferol 2000 units 1 capsule once a day. 12.  Buspirone 5 mg 2 times a day. 13.  Atorvastatin 20 mg once a day. 14.  Atenolol 50 mg 2 times a day. 15.  Aspirin 81 mg once a day.  SOCIAL HISTORY: Quit smoking and drinking about 40 years back. Denies using any illicit drugs. Works as an  accountant. The patient was independent of ADLs and IADLs about 2-1/2 months back.  FAMILY HISTORY: Hypertension and cancers.  REVIEW OF SYSTEMS: Could not be obtained from the patient.  PHYSICAL EXAMINATION: GENERAL: This is a well-built, well-nourished, age-appropriate male lying down in the bed, restless. VITAL SIGNS: Temperature 98.7, pulse 94, blood pressure 127/105, respiratory rate of 22, oxygen saturation 95% on room air. HEENT: Head normocephalic, atraumatic. There is  no scleral icterus. Conjunctivae normal. Pupils equal and react to light. Mucous membranes are moist. Has a tongue bite with mild bleeding. No pharyngeal erythema. Ears: Does not have any ear infection in both ears.  NECK: Supple. No lymphadenopathy. No JVD. No carotid bruit. No thyromegaly.  CHEST: There is no focal tenderness. LUNGS: Bilaterally clear to auscultation. HEART: S1, S2 regular. No murmurs are heard. ABDOMEN: Bowel sounds plus. Soft, nontender, nondistended. No hepatosplenomegaly. EXTREMITIES: No pedal edema. Pulses 2+. SKIN: No rash or lesions. MUSCULOSKELETAL: Good range of motion in all extremities. NEUROLOGIC: Patient is alert restless, does not answer any of the questions, nodded appropriately to some of the questions. Moving all 4 extremities. No cranial nerve abnormalities. Could not examine the sensory. Could not examine the motor power in the extremities, but seems to be 5/5 as the patient is extremely agitated.   LABORATORY DATA: CBC is completely within normal limits. Troponin less than 0.02. CMP is completely within normal limits. CT, head, without contrast: Negative for any acute intracranial process. UA negative for nitrites and leukocyte esterase.  ASSESSMENT AND PLAN: Mr. Ian Gill, a 69 year old male, comes with altered mental status. 1.  Altered mental status. Highly concerning about this is from the seizures. The patient received 3 mg of Ativan, and started the patient on Dilantin. Keep the patient on seizure precautions. 2.  Seizures. Concern about buspirone decreasing the seizure threshold. Continue with the Dilantin. Continue with the Keppra. Consult neurology in the morning. Keep the patient on seizure precautions. 3.  Hypertension. Continue with atenolol. 4.  Anxiety. Continue the clonazepam and mirtazapine as well as quetiapine.  TIME SPENT: 70 minutes.   ____________________________ Susa GriffinsPadmaja Aravind Chrismer, MD pv:ST D: 01/21/2014 23:41:58 ET T: 01/21/2014  23:51:31 ET JOB#: [id number]  cc: Susa GriffinsPadmaja Tametra Ahart, MD, <Dictator> Susa GriffinsPADMAJA Jermany Rimel MD ELECTRONICALLY SIGNED 01/23/2014 22:13

## 2014-05-24 NOTE — Consult Note (Signed)
PATIENT NAME:  Ian Gill, Ian Gill MR#:  [id number] DATE OF BIRTH:  [birth year]  DATE OF CONSULTATION:  01/22/2014  REFERRING PHYSICIAN:   CONSULTING PHYSICIAN:  Weston SettleShervin Derold Dorsch, MD  REQUESTING PHYSICIAN: DrClerance Lav.. Padmaja Vasireddy.   REASON FOR CONSULTATION: Seizure.   HISTORY OF PRESENT ILLNESS: The patient is a 69 year old white male who was having couple of hours of confusion, disorientation, and then strange internal feeling causing him to rock back and forth at the house where he lives with his son. They brought him over to the hospital for mental status changes and while in the Emergency Room he had a generalized tonic-clonic seizure with a tongue bite. The patient has a history of seizures dating back to the age of 69. He claims that a neurologist in [place] by the name of Dr. Sandria ManlyLove had found some scar tissue in his brain but does not know the location or details. He had been seizure-free for quite a number of years and then had some breakthroughs over the last 10 years including recently having more frequent ones. At some point he was on a combination of Dilantin and Keppra and became Dilantin toxic with levels in the 40s and had ataxia, dizziness and nausea, so he was taken off the Dilantin. He was only on Keppra monotherapy, but his Keppra dose was not adjusted when they took him off the Dilantin.  Of note the patient was on buspirone for anxiety at home and not bupropion as asserted by the records which increases the seizure risk. He had been on clonazepam for anxiety as well and has admittedly reduced his dosage down several days before the seizure occurred. About two months ago he had an MRI of the brain by an outpatient neurologist. We do not have access to those records. This occurred in [place] when he was found down at home. No prior prodromal focal neurological symptoms before he was found down. He was told he had a possible stroke in [place]; however, CT scan of the brain on this admission  was reviewed be by me personally and there is no evidence of any old stroke of any kind including cortical or subcortical. I suspect that he had a postictal state and was found down at home due to a generalized seizure that was unwitnessed. The patient has never had coronary artery disease or stenting, and yet he is on the combination of aspirin plus Plavix.    LABORATORY DATA:  His labs upon admission included a normal CBC and basic metabolic panel. Urinalysis is normal. Coagulation profile is normal. AST and ALT were elevated at 166 and 156, and they have come down, but still slightly elevated at 105 and 117 respectively. Albumin is 3.1. CK level is 205 and then now is down to 185. Troponin I is negative. TSH is normal. They did load him with Dilantin after he had a seizure in the ER and his Dilantin level this morning is 10.4.   PAST MEDICAL HISTORY:  1.  Gastroesophageal reflux disease.  2.  Hypertension.  3.  Depression and anxiety.  4.  Seizure disorder.  5.  Hyperlipidemia.   PAST SURGICAL HISTORY:  1.  Right shoulder surgery.  2.  Spinal fusion.  3.  Left knee surgery.   CURRENT MEDICATIONS:  In the hospital:  1.  Aspirin 81 mg daily.  2.  Atenolol 50 mg daily.  3.  Lipitor 20 mg daily.  4.  Clonazepam 0.5 mg b.i.d.  5.  Plavix 75 mg daily.  6.  Enalapril 20 mg daily.  7.  Lovenox 40 mg subcutaneously daily.  8.  Keppra 1000 mg b.i.d.  9.  Remeron 15 mg daily.  10.  Seroquel 25 mg daily.  11.  Dilantin 100 mg IV q.12 hours.   ALLERGIES: INCLUDE: 1.  WELLBUTRIN. 2.  IV DYE.   SOCIAL HISTORY: Denies any alcohol, illicit drug use or smoking,   FAMILY HISTORY: Negative for any seizure disorder.   REVIEW OF SYSTEMS:  Reveals no fever, no meningismus. No diplopia. No dysphagia, no chest pain or shortness of breath. No diarrhea or constipation. All other review of systems are negative.   PHYSICAL EXAMINATION:  VITAL SIGNS: Blood pressure is 140/71, pulse of 70, temperature  99.1.  HEART: Regular rate and rhythm, S1, S2. No murmurs.  LUNGS: Clear to auscultation.  NECK: There are no carotid bruits.  NEUROLOGIC: He is awake and alert. Language is fluent. Comprehension, naming and repetition are intact. Pupils are equal and reactive. Extraocular movements are intact. Face is symmetrical. Tongue is midline with a laceration of the left side. Strength is 5/5 bilaterally in the upper and lower extremities in all muscle groups. Reflexes are +2 and symmetrical. Sensation is intact to all modalities. Coordination is fully intact. There is no Babinski sign. There is no Hoffmann sign. There are no skin rashes. Gait testing was deferred at this time because he is in the Intensive Care Unit.   IMPRESSION AND PLAN:  1.  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 I did ask the family to obtain the MRI of the brain's CD for review on an outpatient basis, which would be very useful. At this point, I will maximize his Keppra for better efficacy and I will increase it to 1500 mg b.i.d.  I will discontinue the Dilantin as it is not something that we want to continue long-term in this patient. I will also order an EEG to assess his cerebral activity.  His cutting back of clonazepam may have been a factor in having a breakthrough seizure as well. I do not see any evidence of an old stroke to cause seizures on his CT scan and I suspect that he simply had a postictal state and was found down in the apartment due to that and not due to a stroke. His neurological examination is not consistent with a stroke, either. The patient is on combination of two antiplatelet agents for secondary stroke prevention.  This is no better  than either agent alone and increases his risk of hemorrhage significantly. He does not have any history of coronary artery disease and stenting. At this point I will discontinue aspirin and continue Plavix monotherapy.   2.  I think the AST and ALT elevations may be related to muscle related enzyme elevation from seizure.     ____________________________ Weston Settle, MD se:at D: 01/22/2014 12:46:36 ET T: 01/22/2014 13:39:54 ET JOB#: [id number]  cc: Weston Settle, MD, <Dictator> Weston Settle MD ELECTRONICALLY SIGNED 03/08/2014 14:44

## 2014-05-28 NOTE — Discharge Summary (Signed)
PATIENT NAME:  Ian Gill, Amilio R MR#:  [id number] DATE OF BIRTH:  [birth year]  DATE OF ADMISSION:  01/21/2014 DATE OF DISCHARGE:  01/24/2014  ADMITTING DIAGNOSES:  1.  Altered mental status likely due to seizures.    2.  Seizures.  3.  Hypertension.  4.  Anxiety.   DISCHARGE DIAGNOSES:  1.  Recurrent seizures.  2.  Altered mental status, likely postictal, resolved.   3.  Elevated transaminases of unclear etiology at present.  4.  Bradycardia to atenolol, resolved.  5.  Hypertension.  6.  Gallbladder polyps on ultrasound.  7.  Acute respiratory failure with hypoxia due to chronic obstructive pulmonary disease exacerbation.  7.  Acute bronchitis, resolving.  8.  History of hypertension.  9.  GERD.  10.  Anxiety and depression.  11.  Seizure disorder.  12.  Cerebrovascular accident.  13.  Coronary artery disease, status post stent.  14.  Hyperlipidemia.  15.  Obstructive sleep apnea on continuous positive airway pressure at home.   DISCHARGE CONDITION: Stable.   DISCHARGE MEDICATIONS:  The patient is to continue:  1.  Atorvastatin 10 mg p.o. daily.  2.  Cholecalciferol 2000 units once daily.  3.  Clonazepam 0.5 mg twice daily.  4.  Plavix 75 mg p.o. daily.  5.  Fish oil 1200 mg p.o. daily.  6.  Multivitamin.  7.  Platinum 500/300/250 mcg 1 tablet once daily.  8.  Seroquel 25 mg at bedtime.  9.  Ranitidine 150 mg p.o. daily.  10.  Keppra 750 mg p.o. 2 tablets twice daily.  11.  Venlafaxine extended release 75 mg p.o. every second day.  12.  Remeron 15 mg 1/2 tablet once daily at bedtime.  13.  Buspirone 10 mg p.o. twice daily.   14.  Atenolol 25 mg p.o. twice daily.  15.  Prednisone taper 50 mg p.o. once on 01/25/2014, then taper x 10 mg once daily until stopped.  16.  Enalapril 20 mg p.o. twice daily.  17.  Budesonide/formoterol 160/4.5 two puffs twice daily.  18.  Tiotropium 1 inhalation once daily.  19.  Levofloxacin 500 mg once daily to complete course.  20.   Combivent Respimat 1 inhalation 4 times daily as needed.  21.  Ativan 1 mg 3 times daily as needed.   Patient is not to take aspirin unless recommended by primary care physician or cardiologist.   Home health with physical therapy nurse to help with inhalation therapy which is new for patient.   Home oxygen, none.   DIET: Salt 2 grams, low-fat, low-cholesterol, regular consistency.   ACTIVITY LIMITATIONS: As tolerated.   REFERRAL: To home health physical therapy.   FOLLOWUP APPOINTMENTS: With Dr. Beverely RisenFozia Khan, in 2 days after discharge; Dr. Marlowe KaysEly's group in 2 days after discharge, call for evaluation;  Dr. Reche Dixonharles Peterson, in 1 week after discharge.   CONSULTANTS: Care management, social work,  Dr. Nicholas LoseEshraghi, neurology, and Dr. Mellody DrownMatthew Smith, neurology.   RADIOLOGIC STUDIES: Had a positive CT of head without contrast on 01/21/2014, was negative, chest x-ray PA and lateral 01/23/2014, revealing hyperinflation without evidence of acute cardiopulmonary disease; lung VQ scan 01/23/2014, showed mild patchiness on ventilation and perfusion imaging, likely related to COPD, no definite pulmonary embolus; ultrasound of abdomen, limited survey, 01/23/2014, revealing multiple gallbladder polyps, no evidence of cholelithiasis or cholecystitis, incidental 3 cm simple cyst arising from the upper pole of the right kidney noted; otherwise normal examination.   HOSPITAL COURSE: The patient is 69 year old male with history  of seizures who presents to the hospital with complaints of altered mental status. Please refer to Dr. Heron Nay admission note on the 01/21/2014. Apparently in the beginning of admission, patient was complaining of not feeling well and was found to be somewhat confused and was brought to Emergency Room for further evaluation. In the Emergency Room, he had a seizure episode and was admitted to the hospital for further evaluation.   On arrival to the Emergency Room, the patient's vital signs,  temperature was 98.7, pulse was 94, respiration rate was 22, blood pressure 127/105, saturation was 95% on room air.   PHYSICAL EXAM: Unremarkable.   The patient's lab data done on arrival to the hospital showed the glucose level of 143, otherwise BMP was normal. The patient's alcohol level was less than 3. The patient's liver enzymes showed elevation of AST, as well as ALT to 166, and 156; otherwise, unremarkable. The patient's ammonia level was checked on the 01/22/2014, was normal at 31. Cardiac enzymes were normal. TSH was normal at 2.33. The patient's CBC, white blood cell count was 7.5, hemoglobin was 13.6, platelet count was 168,000; coagulation panel was unremarkable. Urinalysis was normal. EKG showed of poor data quality, sinus rhythm with marked sinus arrhythmia at 80 beats per minute, nonspecific T wave abnormality. The patient was admitted to the hospital for further evaluation.   He was consulted by neurologist, Dr. Nicholas Lose, who saw patient in consultation on the 01/22/2014. Dr. 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 He did not recommend repeat MRI since it was done recently just 2 months ago; he recommended to advance his Keppra to 105 mg twice daily dose, and discontinue Dilantin which was given in the Emergency Room since he did not want this to be continued long-term in this patient. He also recommended to get an encephalogram to assess his cerebral activity. He also recommended to cut back clonazepam as according to Dr. Nicholas Lose this may be a factor in having breakthrough seizures as well, and in regards to his altered mental status Dr. Nicholas Lose felt that patient very likely had a postictal state as he was found down in his apartment due to this, not due to seizures.  Neurological examination was not considering this a stroke either and so because of that his combination 2 antiplatelet agents was not necessary. They recommended to continue the Plavix monotherapy and discontinue aspirin therapy.   In regards to elevation in AST as well as ALT levels, he felt that it could have been related to muscle related enzyme elevation due to the seizure. The patient's medications were advanced to 105 mg p.o. daily dose of Keppra and Dilantin was discontinued. With this his seizures were well controlled and there no recorded instances. The patient did have an electroencephalogram while he was in the hospital and the electroencephalogram was read by Dr. Mellody Drown on the 01/23/2014. It read abnormal awake as well as drowsy and the encephalogram, there was evidence of epilepsy, bifrontal sharp waves seen during this EEG.  The patient's altered mental status was likely also posictal state.   In regards to elevated transaminases, they were also unclear etiology; however, patient's transaminases were followed while he was in the hospital, the patient's CK levels were checked, but they were completely within  normal limits. Not explaining his AST, as well as ALT elevation. With IV fluids and conservative therapy, the patient's AST, as well as ALT, improved from a level of 166 and 156, respectively; to 84 and 86 respectively. It is recommended to follow the patient's liver function tests and get a hepatologist  consultation for liver enzyme abnormalities, although we felt that the patient's mild elevation of AST, as well as ALT, could have been related to his medications such as atorvastatin. The patient was noted to be bradycardic to Tylenol and atenolol dose was decreased, and to have bradycardia resolved with decreasing doses of atenolol.   For hypertension, the patient's blood pressure was noted to be also elevated possibly due to staying in the hospital; however, Enalapril was advised  to be advanced and on the day of discharge patient is on 10 mg p.o. twice daily dose of enalapril dose.   In regards to gallbladder polyps, which were noted on ultrasound, the patient is to follow up with  surgeon to make decisions about a gallbladder surgery if needed.   The patient was noted to be hypoxic on the day of attempted discharge. Chest x-ray did not show any abnormalities although patient's lung exam revealed wheezes as well as some rhonchi. It was felt that the patient had acute respiratory failure with hypoxia due to COPD  exacerbation since his CT scan did not show any significant abnormalities. The patient was started on antibiotic therapy as well as inhalation therapy and steroids with which  he improved in the next few days. The patient is to continue steroids as well as inhalation therapy. He is being discharged in stable condition with the above-mentioned medications and follow-up.   In regards to hypoxia, it was felt that the patient's hypoxia should be very well controlled at nighttime especially, and so we advised patient's oxygenation to be checked as outpatient, in the outpatient setting, especially at nighttime, which could exacerbate his seizure activity.   On the day of discharge, temperature was 97.8, pulse was 75, respiration was 18, blood pressure 157/79, saturation was 92% to 97%, on room air at rest, as well as on exertion.   TIME SPENT: Was 50 minutes.    ____________________________ Katharina Caper, MD rv:nt D: 01/30/2014 16:13:03 ET T: 01/30/2014 22:21:16 ET JOB#: [id number]  cc: Katharina Caper, MD, <Dictator> Lyndon Code, MD Carmie End, MD Reche Dixon, MD  Parthenia Tellefsen MD ELECTRONICALLY SIGNED 02/09/2014 10:11

## 2014-10-14 ENCOUNTER — Encounter: Payer: Self-pay | Admitting: Emergency Medicine

## 2014-10-14 ENCOUNTER — Emergency Department
Admission: EM | Admit: 2014-10-14 | Discharge: 2014-10-14 | Disposition: A | Discharge status: Home / Self Care | Payer: Medicare Other | Attending: Emergency Medicine | Admitting: Emergency Medicine

## 2014-10-14 DIAGNOSIS — F1310 Sedative, hypnotic or anxiolytic abuse, uncomplicated: Secondary | ICD-10-CM | POA: Insufficient documentation

## 2014-10-14 DIAGNOSIS — R569 Unspecified convulsions: Secondary | ICD-10-CM | POA: Diagnosis present

## 2014-10-14 DIAGNOSIS — Z87891 Personal history of nicotine dependence: Secondary | ICD-10-CM | POA: Insufficient documentation

## 2014-10-14 HISTORY — DX: Major depressive disorder, single episode, unspecified: F32.9

## 2014-10-14 HISTORY — DX: Depression, unspecified: F32.A

## 2014-10-14 HISTORY — DX: Unspecified convulsions: R56.9

## 2014-10-14 HISTORY — DX: Chronic obstructive pulmonary disease, unspecified: J44.9

## 2014-10-14 HISTORY — DX: Anxiety disorder, unspecified: F41.9

## 2014-10-14 LAB — CBC WITH DIFFERENTIAL/PLATELET
Basophils Absolute: 0 10*3/uL (ref 0–0.1)
Basophils Relative: 1 %
EOS PCT: 3 %
Eosinophils Absolute: 0.2 10*3/uL (ref 0–0.7)
HCT: 42.3 % (ref 40.0–52.0)
Hemoglobin: 14.6 g/dL (ref 13.0–18.0)
LYMPHS ABS: 1.2 10*3/uL (ref 1.0–3.6)
LYMPHS PCT: 21 %
MCH: 32.2 pg (ref 26.0–34.0)
MCHC: 34.4 g/dL (ref 32.0–36.0)
MCV: 93.5 fL (ref 80.0–100.0)
Monocytes Absolute: 0.9 10*3/uL (ref 0.2–1.0)
Monocytes Relative: 16 %
Neutro Abs: 3.3 10*3/uL (ref 1.4–6.5)
Neutrophils Relative %: 59 %
PLATELETS: 131 10*3/uL — AB (ref 150–440)
RBC: 4.52 MIL/uL (ref 4.40–5.90)
RDW: 13 % (ref 11.5–14.5)
WBC: 5.6 10*3/uL (ref 3.8–10.6)

## 2014-10-14 LAB — COMPREHENSIVE METABOLIC PANEL
ALK PHOS: 43 U/L (ref 38–126)
ALT: 29 U/L (ref 17–63)
AST: 70 U/L — ABNORMAL HIGH (ref 15–41)
Albumin: 3.9 g/dL (ref 3.5–5.0)
Anion gap: 9 (ref 5–15)
BUN: 17 mg/dL (ref 6–20)
CALCIUM: 9.3 mg/dL (ref 8.9–10.3)
CO2: 25 mmol/L (ref 22–32)
CREATININE: 1.16 mg/dL (ref 0.61–1.24)
Chloride: 105 mmol/L (ref 101–111)
Glucose, Bld: 126 mg/dL — ABNORMAL HIGH (ref 65–99)
Potassium: 3.7 mmol/L (ref 3.5–5.1)
Sodium: 139 mmol/L (ref 135–145)
Total Bilirubin: 0.8 mg/dL (ref 0.3–1.2)
Total Protein: 6.5 g/dL (ref 6.5–8.1)

## 2014-10-14 LAB — URINALYSIS COMPLETE WITH MICROSCOPIC (ARMC ONLY)
BILIRUBIN URINE: NEGATIVE
Bacteria, UA: NONE SEEN
GLUCOSE, UA: NEGATIVE mg/dL
HGB URINE DIPSTICK: NEGATIVE
Leukocytes, UA: NEGATIVE
NITRITE: NEGATIVE
Protein, ur: NEGATIVE mg/dL
SPECIFIC GRAVITY, URINE: 1.017 (ref 1.005–1.030)
Squamous Epithelial / LPF: NONE SEEN
pH: 6 (ref 5.0–8.0)

## 2014-10-14 LAB — ETHANOL: Alcohol, Ethyl (B): <5 mg/dL (ref <5)

## 2014-10-14 LAB — URINE DRUG SCREEN, QUALITATIVE (ARMC ONLY)
Amphetamines, Ur Screen: NOT DETECTED
Barbiturates, Ur Screen: NOT DETECTED
Benzodiazepine, Ur Scrn: POSITIVE — AB
CANNABINOID 50 NG, UR ~~LOC~~: NOT DETECTED
COCAINE METABOLITE, UR ~~LOC~~: NOT DETECTED
MDMA (ECSTASY) UR SCREEN: NOT DETECTED
Methadone Scn, Ur: NOT DETECTED
Opiate, Ur Screen: NOT DETECTED
PHENCYCLIDINE (PCP) UR S: NOT DETECTED
Tricyclic, Ur Screen: NOT DETECTED

## 2014-10-14 MED ORDER — LEVETIRACETAM 500 MG PO TABS
1500.0000 mg | ORAL_TABLET | Freq: Once | ORAL | Status: AC
  Administered 2014-10-14: 1500 mg via ORAL
  Filled 2014-10-14: qty 3

## 2014-10-14 MED ORDER — CLONAZEPAM 0.5 MG PO TABS
0.5000 mg | ORAL_TABLET | Freq: Once | ORAL | Status: AC
  Administered 2014-10-14: 0.5 mg via ORAL

## 2014-10-14 MED ORDER — CLONAZEPAM 0.5 MG PO TABS
ORAL_TABLET | ORAL | Status: AC
  Administered 2014-10-14: 0.5 mg via ORAL
  Filled 2014-10-14: qty 1

## 2014-10-14 MED ORDER — LEVETIRACETAM 500 MG PO TABS: 1000.0000 mg | ORAL_TABLET | Freq: Once | ORAL | Status: DC

## 2014-10-14 NOTE — Discharge Instructions (Signed)

## 2014-10-14 NOTE — ED Provider Notes (Signed)
[facility] Emergency Department Provider Note  Time seen: 3:13 PM  I have reviewed the triage vital signs and the nursing notes.   HISTORY  Chief Complaint Seizures    HPI Ian Gill is a 69 y.o. male with a past medical history of depression, anxiety, COPD, seizure disorder on Keppra and Depakote who presents to the emergency department after a seizure. According to the son patient had what appears to be a tonic-clonic seizure with incontinence. Patient has a history of seizures and is usually postictal for several hours. Son states the patient has a history of prescription medication abuse, and they have a bed for the [facility] [place] [facility]. The son hopes that if everything checks out okay they will be able to take the patient to [facility]. Patient denies missing any medication doses, but is not completely sure. Denies any recent illnesses, nausea, vomiting, diarrhea, fever.     Past Medical History  Diagnosis Date  . Seizures   . Depression   . Anxiety   . COPD (chronic obstructive pulmonary disease)     There are no active problems to display for this patient.   Past Surgical History  Procedure Laterality Date  . Total knee arthroplasty    . Shoulder surgery    . Spinal fusion      No current outpatient prescriptions on file.  Allergies Wellbutrin  History reviewed. No pertinent family history.  Social History Social History  Substance Use Topics  . Smoking status: Former Games developer  . Smokeless tobacco: Never Used  . Alcohol Use: No    Review of Systems Constitutional: Negative for fever Cardiovascular: Negative for chest pain. Respiratory: Negative for shortness of breath. Gastrointestinal: Negative for abdominal pain Genitourinary: Negative for dysuria. Neurological: Negative for headache 10-point ROS otherwise  negative.  ____________________________________________   PHYSICAL EXAM:  VITAL SIGNS: ED Triage Vitals  Enc Vitals Group     BP 10/14/14 1458 122/73 mmHg     Pulse Rate 10/14/14 1458 85     Resp 10/14/14 1458 18     Temp 10/14/14 1458 98.6 F (37 C)     Temp Source 10/14/14 1458 Oral     SpO2 10/14/14 1458 88 %     Weight 10/14/14 1458 240 lb (108.863 kg)     Height 10/14/14 1458 6' (1.829 m)     Head Cir --      Peak Flow --      Pain Score --      Pain Loc --      Pain Edu? --      Excl. in GC? --     Constitutional: Alert and oriented. Well appearing and in no distress. Speaking without difficulty. Follows all commands. Eyes: Normal exam, PERRL, EOMI. ENT   Head: Normocephalic and atraumatic   Mouth/Throat: Mucous membranes are moist. Cardiovascular: Normal rate, regular rhythm.  Respiratory: Normal respiratory effort without tachypnea nor retractions. Breath sounds are clear and equal bilaterally. No wheezes/rales/rhonchi. Gastrointestinal: Soft and nontender. No distention.  Musculoskeletal: Nontender with normal range of motion in all extremities.  Neurologic:  Normal speech and language. No gross focal neurologic deficits are appreciated. Speech is normal. Skin:  Skin is warm, dry and intact.  Psychiatric: Mood and affect are normal. Speech and behavior are normal. Patient exhibits appropriate insight and judgment.  ____________________________________________    EKG   EKG reviewed and interpreted by myself shows sinus rhythm at  81 bpm, narrow QRS, normal axis, normal intervals, nonspecific ST changes are present. No ST elevations noted. Occasional PACs.  ____________________________________________    INITIAL IMPRESSION / ASSESSMENT AND PLAN / ED COURSE  Pertinent labs & imaging results that were available during my care of the patient were reviewed by me and considered in my medical decision making (see chart for details).  Patient status post  likely tonic-clonic seizure. Appears well currently, following commands, answering all questions appropriately. His only complaint is of fatigue. We will check labs, load with Keppra, and monitor closely in the emergency department.  Labs are largely within normal limits. We'll discharge patient home into the care of his son who is going to bring the patient a [facility] where he has a bed in a rehabilitation facility tomorrow morning.  ____________________________________________   FINAL CLINICAL IMPRESSION(S) / ED DIAGNOSES  Seizure   Minna Antis, MD 10/14/14 1821

## 2014-10-14 NOTE — ED Notes (Signed)
Per EMS pt had full tonic clonic seizure with loss of bowel and bladder control. EMS states patient has a hx of seizures and is usually postictal for approx 3-4 hrs where he is monitored at the hospital. EMS states per family is it normal for pt to be moaning and grimacing during his post-ictal state. Pt states he is taking Depakote and Keppra and has been compliant with his medications.

## 2014-10-14 NOTE — ED Notes (Signed)
NAD noted at this time. Pt taken to lobby via wheelchair by his son. Pt denies comments/concerns at this time.

## 2015-07-11 ENCOUNTER — Emergency Department: Payer: Medicare Other

## 2015-07-11 ENCOUNTER — Encounter: Payer: Self-pay | Admitting: Emergency Medicine

## 2015-07-11 ENCOUNTER — Emergency Department
Admission: EM | Admit: 2015-07-11 | Discharge: 2015-07-11 | Disposition: A | Discharge status: Home / Self Care | Payer: Medicare Other | Attending: Emergency Medicine | Admitting: Emergency Medicine

## 2015-07-11 DIAGNOSIS — I951 Orthostatic hypotension: Secondary | ICD-10-CM | POA: Diagnosis not present

## 2015-07-11 DIAGNOSIS — Y939 Activity, unspecified: Secondary | ICD-10-CM | POA: Insufficient documentation

## 2015-07-11 DIAGNOSIS — S92302A Fracture of unspecified metatarsal bone(s), left foot, initial encounter for closed fracture: Secondary | ICD-10-CM | POA: Diagnosis not present

## 2015-07-11 DIAGNOSIS — Z8669 Personal history of other diseases of the nervous system and sense organs: Secondary | ICD-10-CM | POA: Diagnosis not present

## 2015-07-11 DIAGNOSIS — Z87891 Personal history of nicotine dependence: Secondary | ICD-10-CM | POA: Insufficient documentation

## 2015-07-11 DIAGNOSIS — R296 Repeated falls: Secondary | ICD-10-CM

## 2015-07-11 DIAGNOSIS — J449 Chronic obstructive pulmonary disease, unspecified: Secondary | ICD-10-CM | POA: Insufficient documentation

## 2015-07-11 DIAGNOSIS — R42 Dizziness and giddiness: Secondary | ICD-10-CM | POA: Insufficient documentation

## 2015-07-11 DIAGNOSIS — F329 Major depressive disorder, single episode, unspecified: Secondary | ICD-10-CM | POA: Insufficient documentation

## 2015-07-11 DIAGNOSIS — W1839XA Other fall on same level, initial encounter: Secondary | ICD-10-CM | POA: Insufficient documentation

## 2015-07-11 DIAGNOSIS — Y999 Unspecified external cause status: Secondary | ICD-10-CM | POA: Insufficient documentation

## 2015-07-11 DIAGNOSIS — Y92009 Unspecified place in unspecified non-institutional (private) residence as the place of occurrence of the external cause: Secondary | ICD-10-CM | POA: Insufficient documentation

## 2015-07-11 DIAGNOSIS — Z8673 Personal history of transient ischemic attack (TIA), and cerebral infarction without residual deficits: Secondary | ICD-10-CM | POA: Insufficient documentation

## 2015-07-11 DIAGNOSIS — S99922A Unspecified injury of left foot, initial encounter: Secondary | ICD-10-CM | POA: Diagnosis present

## 2015-07-11 HISTORY — DX: Cerebral infarction, unspecified: I63.9

## 2015-07-11 LAB — BASIC METABOLIC PANEL
Anion gap: 9 (ref 5–15)
BUN: 26 mg/dL — ABNORMAL HIGH (ref 6–20)
CALCIUM: 9.1 mg/dL (ref 8.9–10.3)
CHLORIDE: 107 mmol/L (ref 101–111)
CO2: 24 mmol/L (ref 22–32)
CREATININE: 1.07 mg/dL (ref 0.61–1.24)
GFR calc Af Amer: >60 mL/min (ref >60)
GFR calc non Af Amer: >60 mL/min (ref >60)
GLUCOSE: 96 mg/dL (ref 65–99)
Potassium: 4.3 mmol/L (ref 3.5–5.1)
Sodium: 140 mmol/L (ref 135–145)

## 2015-07-11 LAB — CBC WITH DIFFERENTIAL/PLATELET
BASOS PCT: 0 %
Basophils Absolute: 0 10*3/uL (ref 0–0.1)
EOS ABS: 0 10*3/uL (ref 0–0.7)
Eosinophils Relative: 1 %
HEMATOCRIT: 42.8 % (ref 40.0–52.0)
HEMOGLOBIN: 14.7 g/dL (ref 13.0–18.0)
LYMPHS ABS: 1 10*3/uL (ref 1.0–3.6)
Lymphocytes Relative: 15 %
MCH: 32.1 pg (ref 26.0–34.0)
MCHC: 34.3 g/dL (ref 32.0–36.0)
MCV: 93.7 fL (ref 80.0–100.0)
MONO ABS: 1.6 10*3/uL — AB (ref 0.2–1.0)
MONOS PCT: 22 %
NEUTROS PCT: 62 %
Neutro Abs: 4.4 10*3/uL (ref 1.4–6.5)
Platelets: 126 10*3/uL — ABNORMAL LOW (ref 150–440)
RBC: 4.57 MIL/uL (ref 4.40–5.90)
RDW: 14.1 % (ref 11.5–14.5)
WBC: 7.1 10*3/uL (ref 3.8–10.6)

## 2015-07-11 LAB — TROPONIN I: Troponin I: <0.03 ng/mL (ref <0.031)

## 2015-07-11 LAB — URINALYSIS COMPLETE WITH MICROSCOPIC (ARMC ONLY)
Bacteria, UA: NONE SEEN
Bilirubin Urine: NEGATIVE
Glucose, UA: NEGATIVE mg/dL
Hgb urine dipstick: NEGATIVE
Leukocytes, UA: NEGATIVE
Nitrite: NEGATIVE
PH: 5 (ref 5.0–8.0)
Protein, ur: NEGATIVE mg/dL
RBC / HPF: NONE SEEN RBC/hpf (ref 0–5)
Specific Gravity, Urine: 1.026 (ref 1.005–1.030)
Squamous Epithelial / LPF: NONE SEEN

## 2015-07-11 IMAGING — CT CT HEAD W/O CM
3 series · 16 of 47 positions shown, 19 images · non-contrast
Comparison: CT scan dated [DATE]

CLINICAL DATA: Multiple recent falls. History of stroke.
Progressive balance difficulty.

EXAM:
CT HEAD WITHOUT CONTRAST
TECHNIQUE: Contiguous axial images were obtained from the base of the skull
through the vertex without intravenous contrast.

[Series 2: head wo · axial · 0.42mm/px · z∈[+2,+142]mm · 10 of 34 slices shown, 13 images]
[im 3/34  brain]
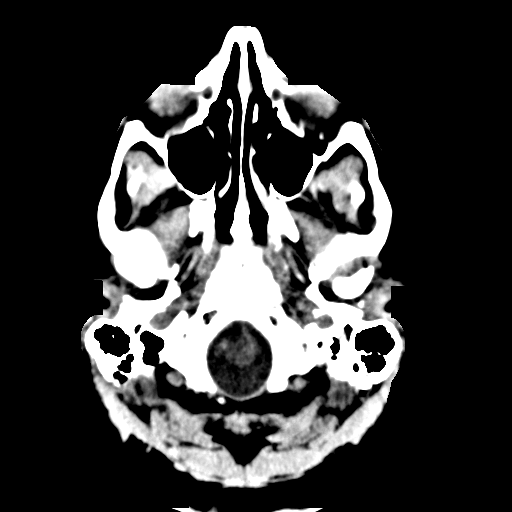
[im 3/34  bone]
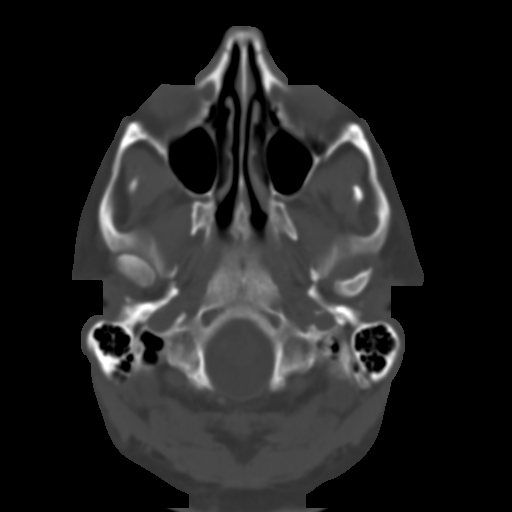
[im 6/34  brain]
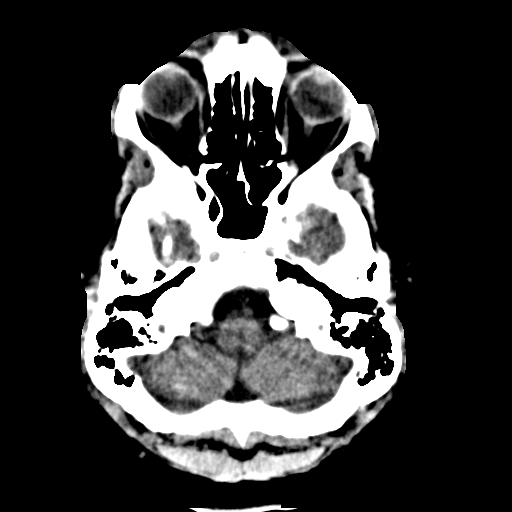
[im 10/34  brain]
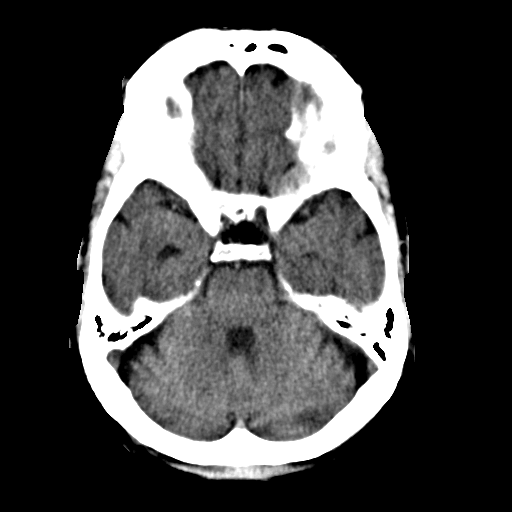
[im 12/34  brain]
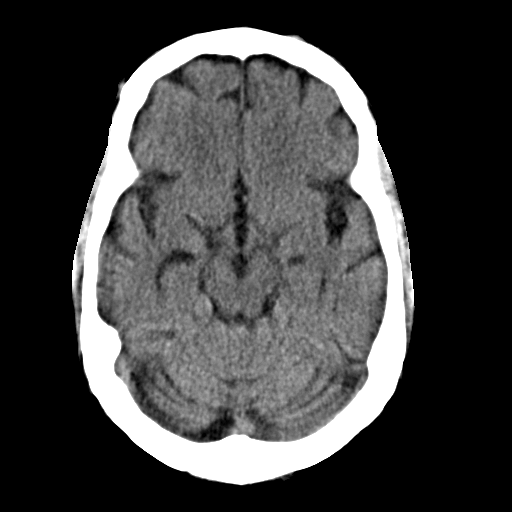
[im 15/34  brain]
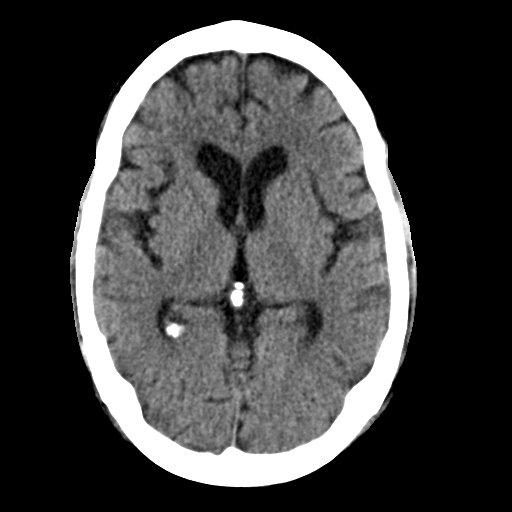
[im 15/34  bone]
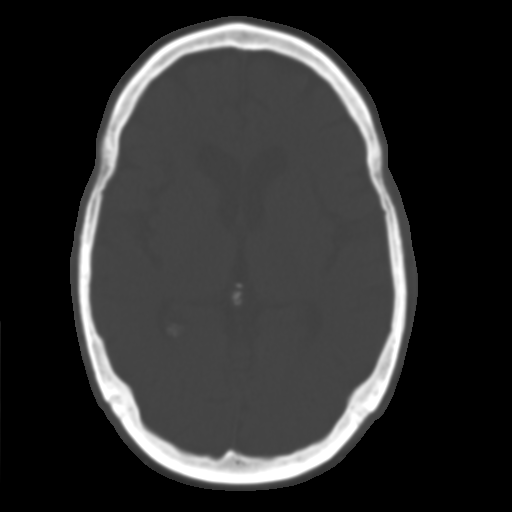
[im 19/34  brain]
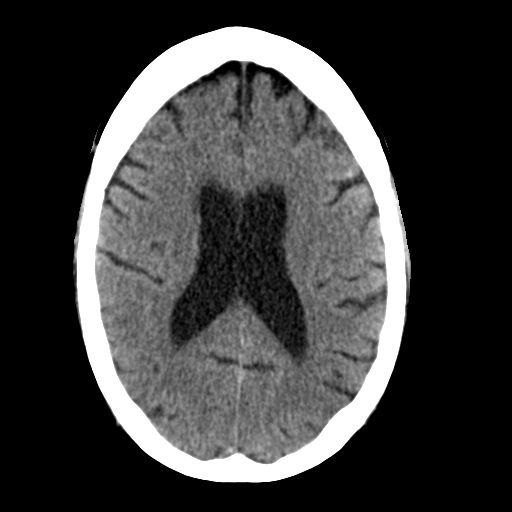
[im 22/34  brain]
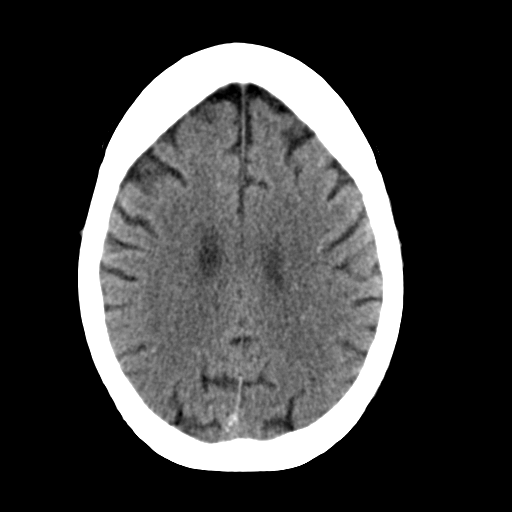
[im 26/34  brain]
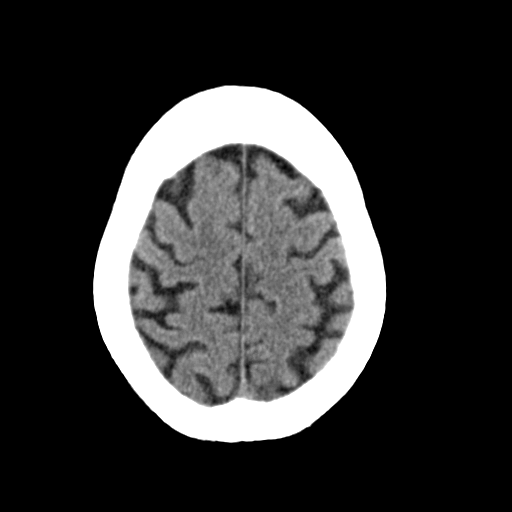
[im 28/34  brain]
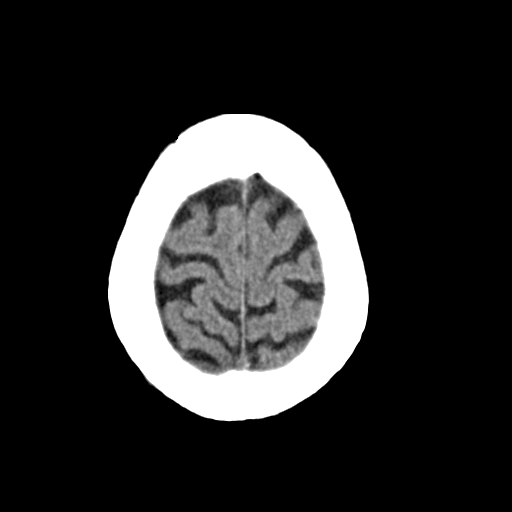
[im 28/34  bone]
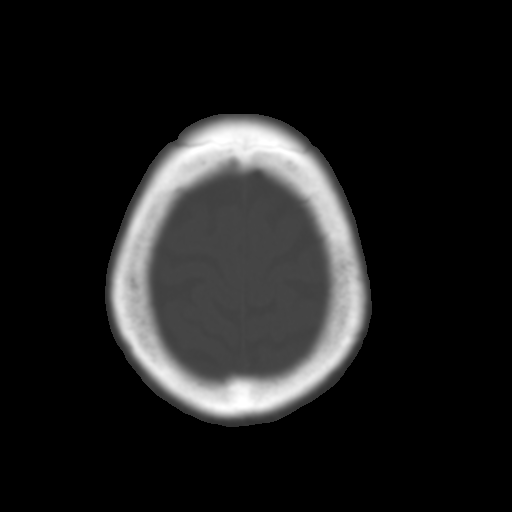
[im 31/34  brain]
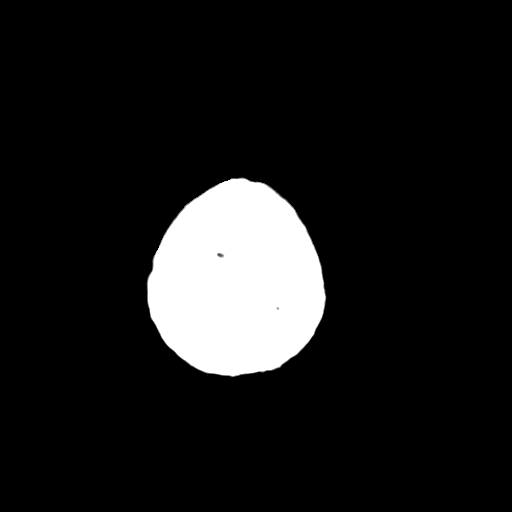

[Series 4: coronal soft tissue · coronal · 0.31mm/px · 3 of 66 slices shown]
[im 22/66  brain]
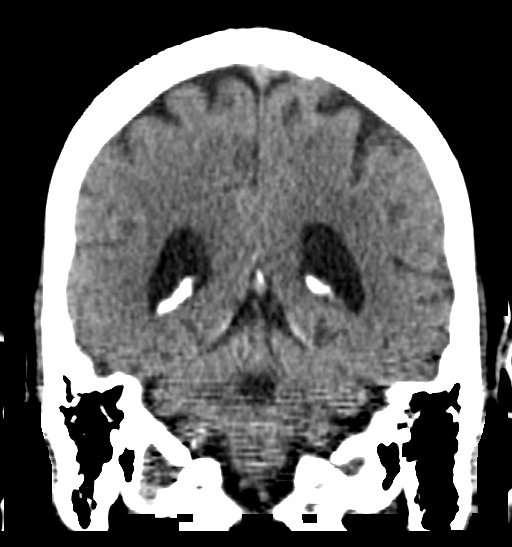
[im 29/66  brain]
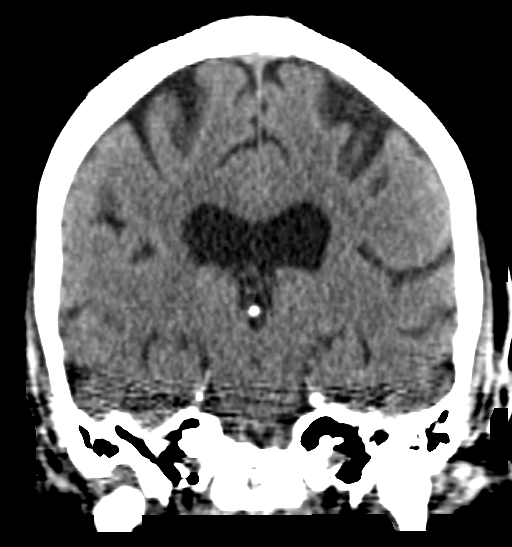
[im 37/66  brain]
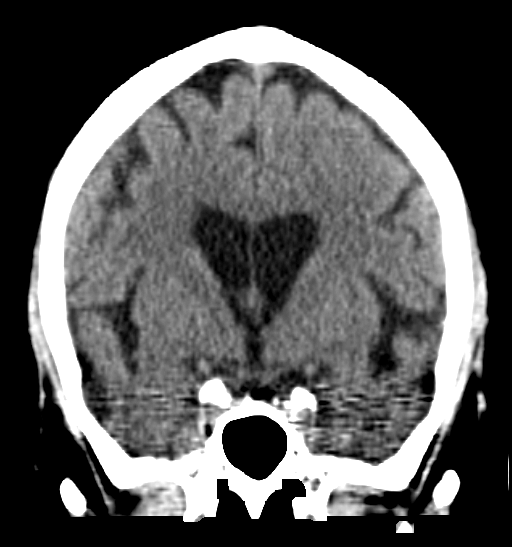

[Series 5: sagittal soft tissue · sagittal · 0.33mm/px · 3 of 66 slices shown]
[im 22/66  brain]
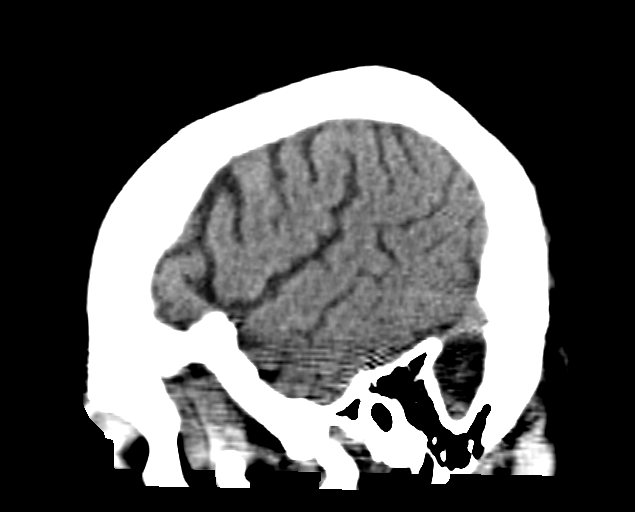
[im 33/66  brain]
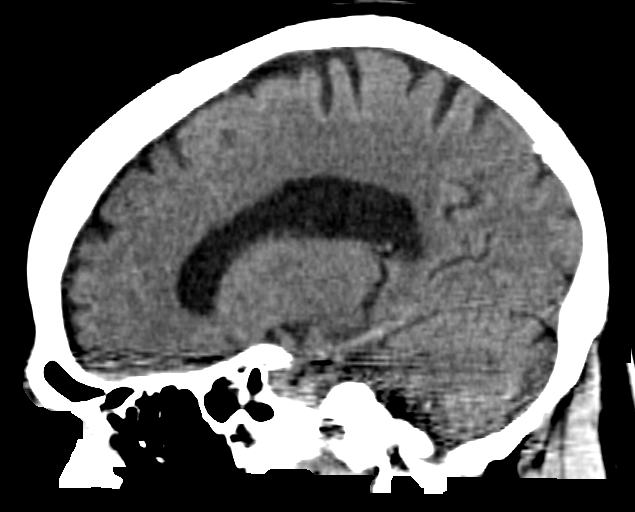
[im 44/66  brain]
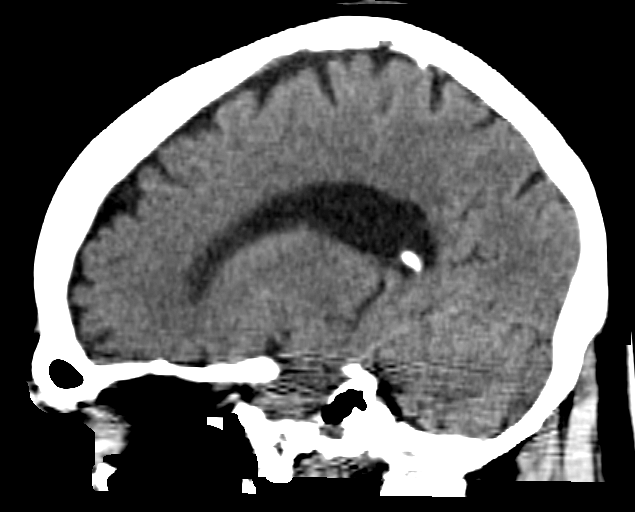

[16 of 47 positions shown; findings below may reference images not displayed]

FINDINGS: No mass lesion. No midline shift. No acute hemorrhage or hematoma.
No extra-axial fluid collections. No evidence of acute infarction.
IMPRESSION: Normal exam.  No change since the prior study.

## 2015-07-11 IMAGING — CR DG FOOT COMPLETE 3+V*L*
3 series · 3 of 3 positions shown · non-contrast
Comparison: None.

CLINICAL DATA: Fall at home.  Pain.

EXAM:
LEFT FOOT - COMPLETE 3+ VIEW

[foot ap]
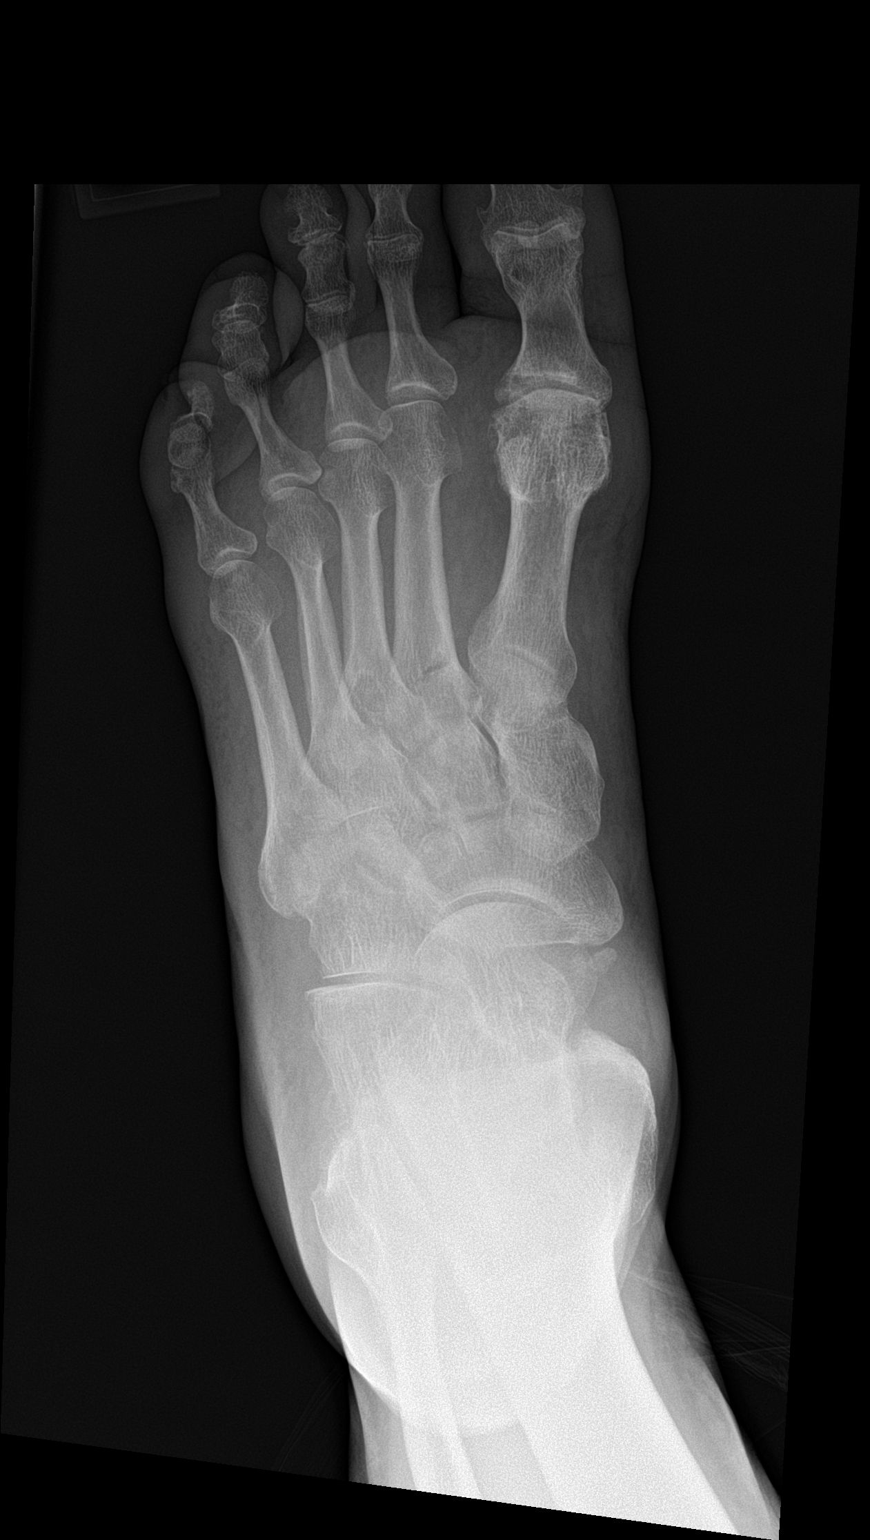

[foot obl]
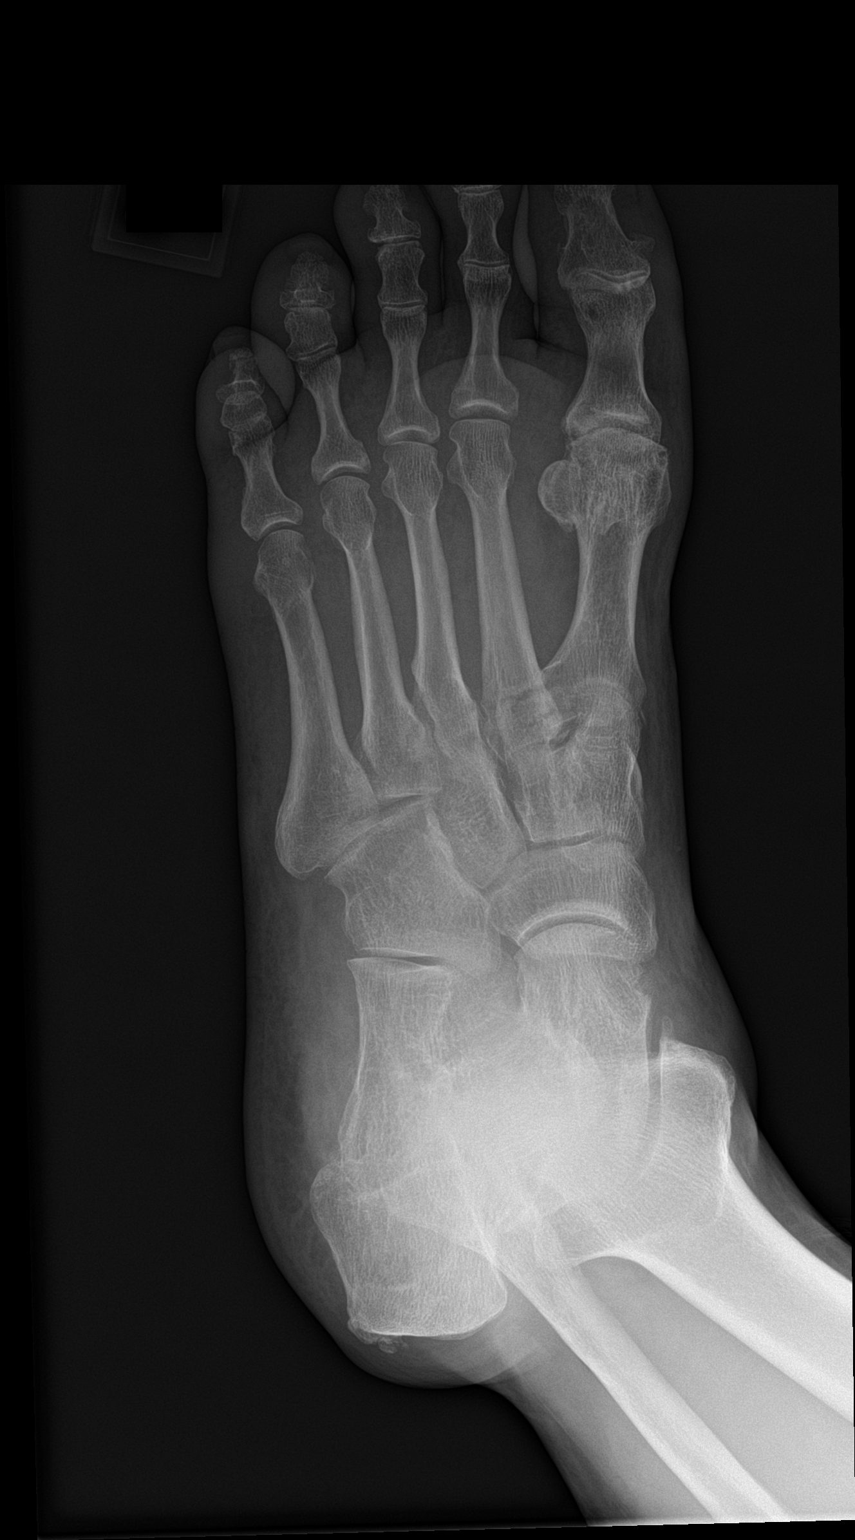

[foot lat]
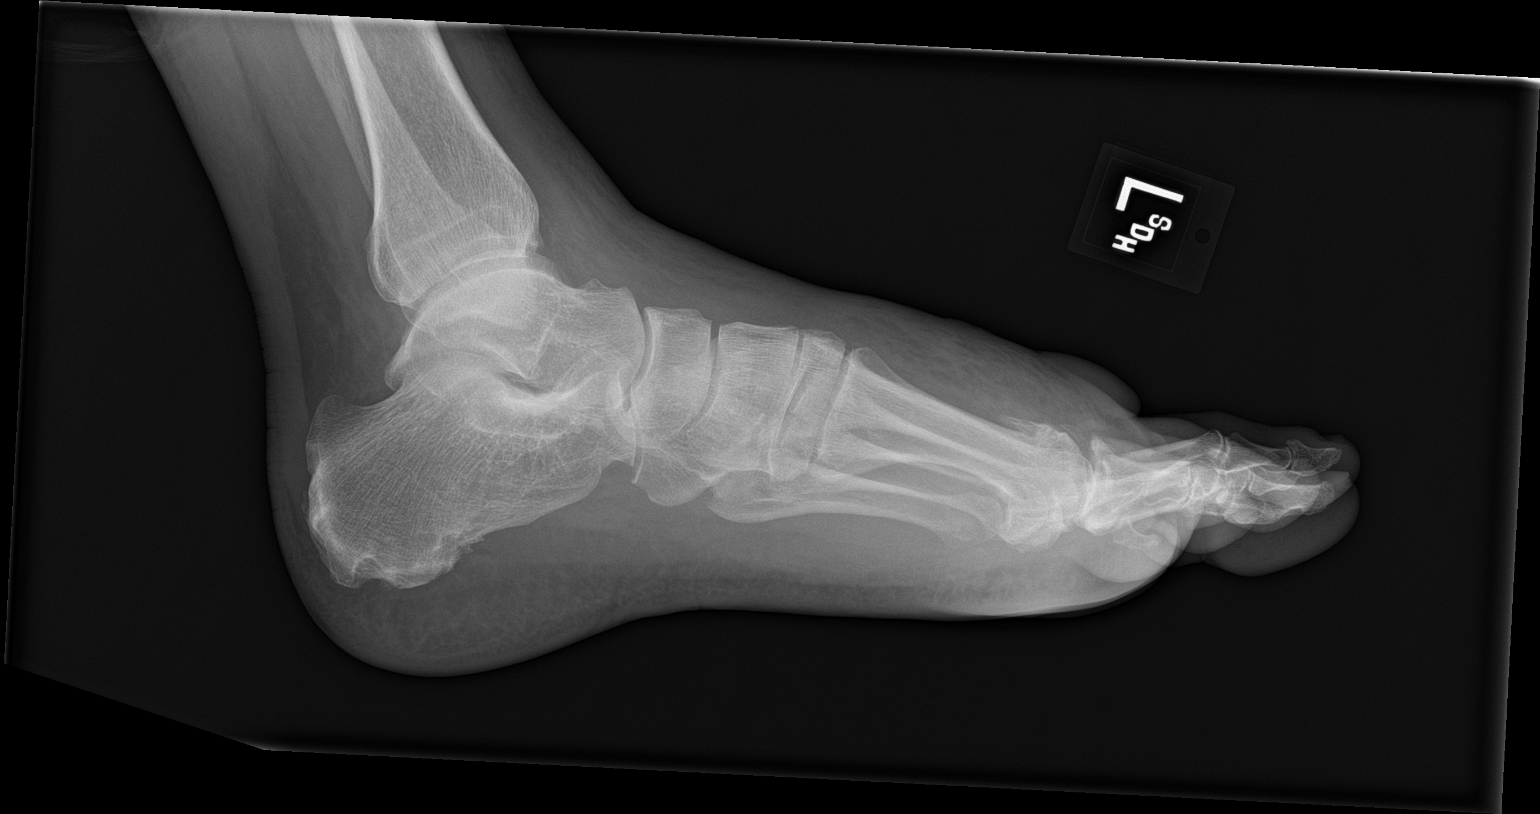

[3 of 3 positions shown; findings below may reference images not displayed]

FINDINGS: Soft tissue swelling dorsally. Suspected proximal second, fourth,
and possibly third metatarsal fractures. These appear nondisplaced.
IMPRESSION: Suspected proximal metatarsal fractures. These appear nondisplaced.
Soft tissue swelling.

## 2015-07-11 MED ORDER — VITAMIN D (CHOLECALCIFEROL) 25 MCG (1000 UT) PO TABS: 1.0000 | ORAL_TABLET | Freq: Every day | ORAL | Status: DC

## 2015-07-11 MED ORDER — CLOPIDOGREL BISULFATE 75 MG PO TABS: 75.0000 mg | ORAL_TABLET | Freq: Every day | ORAL | Status: AC

## 2015-07-11 MED ORDER — ATORVASTATIN CALCIUM 20 MG PO TABS: 20.0000 mg | ORAL_TABLET | Freq: Every day | ORAL | Status: DC

## 2015-07-11 MED ORDER — ATENOLOL 50 MG PO TABS: 50.0000 mg | ORAL_TABLET | Freq: Every day | ORAL | Status: DC

## 2015-07-11 MED ORDER — OMEGA 3 1000 MG PO CAPS: 1.0000 | ORAL_CAPSULE | Freq: Every day | ORAL | Status: DC

## 2015-07-11 MED ORDER — MELATONIN 10 MG PO TABS: 1.0000 | ORAL_TABLET | Freq: Every day | ORAL | Status: DC

## 2015-07-11 MED ORDER — DIVALPROEX SODIUM 250 MG PO DR TAB: 750.0000 mg | DELAYED_RELEASE_TABLET | Freq: Two times a day (BID) | ORAL | Status: DC

## 2015-07-11 MED ORDER — SODIUM CHLORIDE 0.9 % IV BOLUS (SEPSIS)
1000.0000 mL | Freq: Once | INTRAVENOUS | Status: AC
  Administered 2015-07-11: 1000 mL via INTRAVENOUS

## 2015-07-11 MED ORDER — RANITIDINE HCL 150 MG PO TABS: 150.0000 mg | ORAL_TABLET | Freq: Every day | ORAL | Status: DC

## 2015-07-11 MED ORDER — LISINOPRIL 30 MG PO TABS: 30.0000 mg | ORAL_TABLET | Freq: Every day | ORAL | Status: DC

## 2015-07-11 MED ORDER — LEVETIRACETAM 500 MG PO TABS: 1500.0000 mg | ORAL_TABLET | Freq: Two times a day (BID) | ORAL | Status: DC

## 2015-07-11 MED ORDER — SERTRALINE HCL 100 MG PO TABS: 150.0000 mg | ORAL_TABLET | Freq: Every day | ORAL | Status: DC

## 2015-07-11 NOTE — Progress Notes (Signed)
CSW met with patient and his son. Introduced herself and her role. CSW informed patient and family that patient does not qualify for SNF placement due to Medicare requirements of having a 3 night inpatient qualifying stay. Inquired if they could afford private pay for SNF ($8,000 per month fee). Per son they cannot afford to pay for SNF privately. Reported they would explore options RNCM provided. There are no other needs at this time. CSW is signing off.   Ernest Pine, MSW, [place] Social Work Department [phone]

## 2015-07-11 NOTE — Progress Notes (Signed)
PT Cancellation Note  Patient Details Name: Ian MortonWilliam R Runnels MRN: [id number] DOB: 30-Sep-1945   Cancelled Treatment:    Reason Eval/Treat Not Completed: Other (comment). PT discussed with CSW and referring MD. Patient does not qualify for admission and will be discharged home regardless of recommendation from PT. MD and CSW informed PT that evaluation was not needed. PT will sign off.   Kerin RansomPatrick A Jenavi Beedle, PT, DPT    07/11/2015, 4:00 PM

## 2015-07-11 NOTE — ED Notes (Signed)
Pt presents to ED after falling at home yesterday. Pt presents with swollen, blistered and bruised left foot. Pt states his twisted his foot when he fell. Pt states he lost his balance. Pt son states pt balance has been worse the past couple of days. Pt also fell this morning and hit back. Pt denies hitting head.

## 2015-07-11 NOTE — ED Provider Notes (Signed)
[facility] Emergency Department Provider Note   ____________________________________________  Time seen:  I have reviewed the triage vital signs and the triage nursing note.  HISTORY  Chief Complaint Fall   Historian Patient and his son  HPI Ian Gill is a 70 y.o. male who has had multiple falls at home since he used to live in [place] alone, several weeks ago his son moved him down in with his family, however he does waiver gone during the day. The patient has had multiple falls. He states that he gets dizzy lightheaded and his legs give out. No reported head injuries, does have a history of a stroke several years ago. Denies urinary symptoms. He did fall onto his left foot a few days ago and it is very swollen and painful. No recent seizures. No trouble breathing. No fevers.    Past Medical History  Diagnosis Date  . Seizures (HCC)   . Depression   . Anxiety   . COPD (chronic obstructive pulmonary disease) (HCC)   . Stroke [facility])     There are no active problems to display for this patient.   Past Surgical History  Procedure Laterality Date  . Total knee arthroplasty    . Shoulder surgery    . Spinal fusion      Current Outpatient Rx  Name  Route  Sig  Dispense  Refill  . atenolol (TENORMIN) 50 MG tablet   Oral   Take 50 mg by mouth daily.         Marland Kitchen atorvastatin (LIPITOR) 20 MG tablet   Oral   Take 1 tablet by mouth daily.         . clopidogrel (PLAVIX) 75 MG tablet   Oral   Take 1 tablet by mouth daily.         . divalproex (DEPAKOTE) 250 MG DR tablet   Oral   Take 3-4 tablets by mouth 2 (two) times daily. 3 tablets in the morning and 4 tablets at night.         . levETIRAcetam (KEPPRA) 500 MG tablet   Oral   Take 3 tablets by mouth 2 (two) times daily.         Marland Kitchen lisinopril (PRINIVIL,ZESTRIL) 30 MG tablet   Oral   Take 1 tablet by mouth daily.         . Melatonin 10 MG TABS   Oral   Take 1 tablet by  mouth at bedtime.         . Omega 3 1000 MG CAPS   Oral   Take 1 capsule by mouth daily.         . ranitidine (ZANTAC) 150 MG tablet   Oral   Take 150 mg by mouth daily.          . sertraline (ZOLOFT) 100 MG tablet   Oral   Take 1.5 tablets by mouth daily.         . traMADol (ULTRAM) 50 MG tablet   Oral   Take 1 tablet by mouth daily.         . Vitamin D, Cholecalciferol, 1000 units TABS   Oral   Take 1 tablet by mouth daily.           Allergies Wellbutrin  No family history on file.  Social History Social History  Substance Use Topics  . Smoking status: Former Games developer  . Smokeless tobacco: Never Used  . Alcohol Use: No    Review  of Systems  Constitutional: Negative for fever. Eyes: Negative for visual changes. ENT: Negative for sore throat. Cardiovascular: Negative for chest pain. Respiratory: Negative for shortness of breath. Gastrointestinal: Negative for abdominal pain, vomiting and diarrhea. Genitourinary: Negative for dysuria. Musculoskeletal: Negative for back pain.Left foot pain and swelling. Skin: Negative for rash. Neurological: Negative for headache. 10 point Review of Systems otherwise negative ____________________________________________   PHYSICAL EXAM:  VITAL SIGNS: ED Triage Vitals  Enc Vitals Group     BP 07/11/15 0843 130/72 mmHg     Pulse Rate 07/11/15 0843 72     Resp 07/11/15 0843 18     Temp 07/11/15 0843 98.5 F (36.9 C)     Temp Source 07/11/15 0843 Oral     SpO2 07/11/15 0843 96 %     Weight 07/11/15 0843 230 lb (104.327 kg)     Height 07/11/15 0843 6' (1.829 m)     Head Cir --      Peak Flow --      Pain Score 07/11/15 0849 10     Pain Loc --      Pain Edu? --      Excl. in GC? --      Constitutional: Alert and oriented. Well appearing and in no distress. HEENT   Head: Normocephalic and atraumatic.      Eyes: Conjunctivae are normal. PERRL. Normal extraocular movements.      Ears:         Nose: No  congestion/rhinnorhea.   Mouth/Throat: Mucous membranes are moist.   Neck: No stridor. Cardiovascular/Chest: Normal rate, regular rhythm.  No murmurs, rubs, or gallops. Respiratory: Normal respiratory effort without tachypnea nor retractions. Breath sounds are clear and equal bilaterally. No wheezes/rales/rhonchi. Gastrointestinal: Soft. No distention, no guarding, no rebound. Nontender.    Genitourinary/rectal:Deferred Musculoskeletal: Left foot swelling and ecchymosis, normal cap refill however, motor and sensory intact. There is a blister on the skin. Tender left foot. Neurologic:  The bit of a poor historian. Normal speech and language. No gross or focal neurologic deficits are appreciated. Skin:  Skin is warm, dry and intact. No rash noted. Psychiatric: Mood and affect are normal. Speech and behavior are normal. Patient exhibits appropriate insight and judgment.  ____________________________________________   EKG I, Governor Rooksebecca Jena Tegeler, MD, the attending physician have personally viewed and interpreted all ECGs.  64 bpm. Normal sinus rhythm. Narrow QRS. Normal axis. Nonspecific T-wave ____________________________________________  LABS (pertinent positives/negatives)  Labs Reviewed  URINALYSIS COMPLETEWITH MICROSCOPIC ([facility] ONLY) - Abnormal; Notable for the following:    Color, Urine AMBER (*)    APPearance HAZY (*)    Ketones, ur TRACE (*)    All other components within normal limits  BASIC METABOLIC PANEL - Abnormal; Notable for the following:    BUN 26 (*)    All other components within normal limits  CBC WITH DIFFERENTIAL/PLATELET - Abnormal; Notable for the following:    Platelets 126 (*)    Monocytes Absolute 1.6 (*)    All other components within normal limits  TROPONIN I    ____________________________________________  RADIOLOGY All Xrays were viewed by me. Imaging interpreted by Radiologist.  CT head noncontrast: Normal exam. No change since the prior  study.   Left foot complete:  IMPRESSION: Suspected proximal metatarsal fractures. These appear nondisplaced. Soft tissue swelling. __________________________________________  PROCEDURES  Procedure(s) performed: None  Critical Care performed: None  ____________________________________________   ED COURSE / ASSESSMENT AND PLAN  Pertinent labs & imaging results that were available during  my care of the patient were reviewed by me and considered in my medical decision making (see chart for details).   This patient is new to the area, due to the family feels like he is not really safe to be living on his own in [place] anymore. It sounds like he doesn't really have dementia, but is a bit of a poor historian and has a history of a stroke.  I think the source of his falling is orthostatic hypotension and he was given IV fluids here and his repeat orthostatics are normal.  In terms of trauma from the left foot injury, the x-ray does show likely metatarsal fractures, and clinically this fits and he will be placed with a postop shoe. I discussed with Dr. Rosita Kea orthopedics who recommended follow-up with podiatry.   The family is concerned that they are gone during the day and the patient is having trouble walking and may need nursing home placement.  Social work did discuss with the family that he does not have a qualifying admission that would ensure payment, and so any nursing home placement would be out of pocket.  The patient has had physical therapy it sounds like, and is referred to follow up with primary care physician Dr.Hande.  I did discuss with them potentially using diapers during the days that he is not getting up on his own to minimize chance of falls. I did fill out a prescription for a wheelchair which may help him somewhat at home.  Home health information was provided by social work which they can look into it and follow-up with a primary care physician as  well.    CONSULTATIONS:  Social work  Patient / Family / Caregiver informed of clinical course, medical decision-making process, and agree with plan.   I discussed return precautions, follow-up instructions, and discharged instructions with patient and/or family.   ___________________________________________   FINAL CLINICAL IMPRESSION(S) / ED DIAGNOSES   Final diagnoses:  Orthostatic hypotension  Metatarsal fracture, left, closed, initial encounter  Falls frequently              Note: This dictation was prepared with Dragon dictation. Any transcriptional errors that result from this process are unintentional   Governor Rooks, MD 07/11/15 [phone]

## 2015-07-11 NOTE — ED Notes (Signed)
Report given to Jerrie, RN.  

## 2015-07-11 NOTE — ED Provider Notes (Signed)
Patient evaluated and plan for discharge by Dr. Shaune PollackLord on previous shift. However prior to leaving, the patient reports that he just moved from [place] and that Dr. Shaune PollackLord and had planned to write him a new prescriptions for his home medications she has run out of her to establish primary care in this area. I confirmed this with Dr. Shaune PollackLord. The pharmacy technician has verified his home prescriptions and updated them in the computer, and so I have reprinted these for the patient so he can continue managing his chronic medical issues without interruption while he awaits primary care follow-up here.  Ian CheekPhillip Keyvin Rison, MD 07/11/15 [phone]

## 2015-07-11 NOTE — ED Notes (Signed)
Pt taken to CT at this time.

## 2015-07-11 NOTE — Care Management Note (Signed)
Case Management Note  Patient Details  Name: Ian Gill MRN: [id number] Date of Birth: [birth year]  Subjective/Objective:   Spoke to pt. And his son at bedside. The son is a Chartered certified accountantlocal police officer who has the pt. Staying at his home. The pt. Was in [place] last year and they were hoping for the same. I have explained to them [facility] is all that Medicare will cover since the pt. Does not have a 3 day qualifying stay. He currently gets PT at the son's home, but they do not remember the company.  I suspect it is [employer]. I have supplied them with a copy of the [facility] personal [facility], and let them know that medicare will not cover the personal care.  It would be an out of pocket expense.       CSW is here in the ER and will reinforce this information to the pt. And his son.         Action/Plan:   Expected Discharge Date:                  Expected Discharge Plan:     In-House Referral:     Discharge planning Services     Post Acute Care Choice:    Choice offered to:     DME Arranged:    DME Agency:     HH Arranged:    HH Agency:     Status of Service:     Medicare Important Message Given:    Date Medicare IM Given:    Medicare IM give by:    Date Additional Medicare IM Given:    Additional Medicare Important Message give by:     If discussed at Long Length of Stay Meetings, dates discussed:    Additional Comments:  Berna BueCheryl Makinzi Prieur, RN 07/11/2015, 2:03 PM

## 2015-07-11 NOTE — Care Management Note (Signed)
Case Management Note  Patient Details  Name: Ian Gill MRN: [id number] Date of Birth: December 23, 1945  Subjective/Objective:      Conferrred with Dr Shaune PollackLord who confirms there is no reason to qualify the pt. Even to admit in Observation status. Reaffirmed this with the pt. And his son. The son says [facility] as well as another facility. There is a wait apparently for the type of acomadation they are looking for.     I have made a f/u appt. With Dr Saunders Glancehandes office for Tuesday next week at 2pm, as this was the earliest available.           Action/Plan:   Expected Discharge Date:                  Expected Discharge Plan:     In-House Referral:     Discharge planning Services     Post Acute Care Choice:    Choice offered to:     DME Arranged:    DME Agency:     HH Arranged:    HH Agency:     Status of Service:     Medicare Important Message Given:    Date Medicare IM Given:    Medicare IM give by:    Date Additional Medicare IM Given:    Additional Medicare Important Message give by:     If discussed at Long Length of Stay Meetings, dates discussed:    Additional Comments:  Berna BueCheryl Yulianna Folse, RN 07/11/2015, 2:23 PM

## 2015-07-11 NOTE — Discharge Instructions (Signed)
You were evaluated for multiple falls and found to have low blood pressure upon standing called orthostatic hypotension. He was given IV fluids and blood pressures were improved. Make sure you drinking plenty fluids to avoid dehydration.  Your left foot x-ray shows 2 metatarsal fractures, and you can be weightbearing as tolerated, and continue to wear your postop shoe. Recommend follow-up with podiatry, call Dr. Ether Griffins.  The rest of your exam and evaluation are reassuring.  We discussed, I understand there is risk of falling when your home. When family members are not home, you should probably wear a diaper and not get up on your own. I am going to write a prescription for a wheelchair which may help getting around at home.  Return to emergency department for any worsening condition: Confusion, altered mental status, new pain, passing out, or any other symptoms concerning to you.   Metatarsal Fracture A metatarsal fracture is a break in a metatarsal bone. Metatarsal bones connect your toe bones to your ankle bones. CAUSES This type of fracture may be caused by:  A sudden twisting of your foot.  A fall onto your foot.  Overuse or repetitive exercise. RISK FACTORS This condition is more likely to develop in people who:  Play contact sports.  Have a bone disease.  Have a low calcium level. SYMPTOMS Symptoms of this condition include:  Pain that is worse when walking or standing.  Pain when pressing on the foot or moving the toes.  Swelling.  Bruising on the top or bottom of the foot.  A foot that appears shorter than the other one. DIAGNOSIS This condition is diagnosed with a physical exam. You may also have imaging tests, such as:  X-rays.  A CT scan.  MRI. TREATMENT Treatment for this condition depends on its severity and whether a bone has moved out of place. Treatment may involve:  Rest.  Wearing foot support such as a cast, splint, or boot for several  weeks.  Using crutches.  Surgery to move bones back into the right position. Surgery is usually needed if there are many pieces of broken bone or bones that are very out of place (displaced fracture).  Physical therapy. This may be needed to help you regain full movement and strength in your foot. You will need to return to your health care provider to have X-rays taken until your bones heal. Your health care provider will look at the X-rays to make sure that your foot is healing well. HOME CARE INSTRUCTIONS  If You Have a Cast:  Do not stick anything inside the cast to scratch your skin. Doing that increases your risk of infection.  Check the skin around the cast every day. Report any concerns to your health care provider. You may put lotion on dry skin around the edges of the cast. Do not apply lotion to the skin underneath the cast.  Keep the cast clean and dry. If You Have a Splint or a Supportive Boot:  Wear it as directed by your health care provider. Remove it only as directed by your health care provider.  Loosen it if your toes become numb and tingle, or if they turn cold and blue.  Keep it clean and dry. Bathing  Do not take baths, swim, or use a hot tub until your health care provider approves. Ask your health care provider if you can take showers. You may only be allowed to take sponge baths for bathing.  If your health care provider  approves bathing and showering, cover the cast or splint with a watertight plastic bag to protect it from water. Do not let the cast or splint get wet. Managing Pain, Stiffness, and Swelling  If directed, apply ice to the injured area (if you have a splint, not a cast).  Put ice in a plastic bag.  Place a towel between your skin and the bag.  Leave the ice on for 20 minutes, 2-3 times per day.  Move your toes often to avoid stiffness and to lessen swelling.  Raise (elevate) the injured area above the level of your heart while you are  sitting or lying down. Driving  Do not drive or operate heavy machinery while taking pain medicine.  Do not drive while wearing foot support on a foot that you use for driving. Activity  Return to your normal activities as directed by your health care provider. Ask your health care provider what activities are safe for you.  Perform exercises as directed by your health care provider or physical therapist. Safety  Do not use the injured foot to support your body weight until your health care provider says that you can. Use crutches as directed by your health care provider. General Instructions  Do not put pressure on any part of the cast or splint until it is fully hardened. This may take several hours.  Do not use any tobacco products, including cigarettes, chewing tobacco, or e-cigarettes. Tobacco can delay bone healing. If you need help quitting, ask your health care provider.  Take medicines only as directed by your health care provider.  Keep all follow-up visits as directed by your health care provider. This is important. SEEK MEDICAL CARE IF:  You have a fever.  Your cast, splint, or boot is too loose or too tight.  Your cast, splint, or boot is damaged.  Your pain medicine is not helping.  You have pain, tingling, or numbness in your foot that is not going away. SEEK IMMEDIATE MEDICAL CARE IF:  You have severe pain.  You have tingling or numbness in your foot that is getting worse.  Your foot feels cold or becomes numb.  Your foot changes color.   This information is not intended to replace advice given to you by your health care provider. Make sure you discuss any questions you have with your health care provider.   Document Released: 10/05/2001 Document Revised: 05/30/2014 Document Reviewed: 11/09/2013 Elsevier Interactive Patient Education 2016 Elsevier Inc.  Hypotension As your heart beats, it forces blood through your body. This force is called blood  pressure. If you have hypotension, you have low blood pressure. When your blood pressure is too low, you may not get enough blood to your brain. You may feel weak, feel lightheaded, have a fast heartbeat, or even pass out (faint). HOME CARE  Drink enough fluids to keep your pee (urine) clear or pale yellow.  Take all medicines as told by your doctor.  Get up slowly after sitting or lying down.  Wear support stockings as told by your doctor.  Maintain a healthy diet by including foods such as fruits, vegetables, nuts, whole grains, and lean meats. GET HELP IF:  You are throwing up (vomiting) or have watery poop (diarrhea).  You have a fever for more than 2-3 days.  You feel more thirsty than usual.  You feel weak and tired. GET HELP RIGHT AWAY IF:   You pass out (faint).  You have chest pain or a fast or  irregular heartbeat.  You lose feeling in part of your body.  You cannot move your arms or legs.  You have trouble speaking.  You get sweaty or feel lightheaded. MAKE SURE YOU:   Understand these instructions.  Will watch your condition.  Will get help right away if you are not doing well or get worse.   This information is not intended to replace advice given to you by your health care provider. Make sure you discuss any questions you have with your health care provider.   Document Released: 04/09/2009 Document Revised: 09/15/2012 Document Reviewed: 07/16/2012 Elsevier Interactive Patient Education [employer] Elsevier Inc.

## 2015-07-29 ENCOUNTER — Emergency Department
Admission: EM | Admit: 2015-07-29 | Discharge: 2015-07-29 | Disposition: A | Discharge status: Home / Self Care | Payer: Medicare Other | Attending: Emergency Medicine | Admitting: Emergency Medicine

## 2015-07-29 DIAGNOSIS — G40909 Epilepsy, unspecified, not intractable, without status epilepticus: Secondary | ICD-10-CM | POA: Insufficient documentation

## 2015-07-29 DIAGNOSIS — F329 Major depressive disorder, single episode, unspecified: Secondary | ICD-10-CM | POA: Insufficient documentation

## 2015-07-29 DIAGNOSIS — G20 Parkinson's disease: Secondary | ICD-10-CM | POA: Insufficient documentation

## 2015-07-29 DIAGNOSIS — J449 Chronic obstructive pulmonary disease, unspecified: Secondary | ICD-10-CM | POA: Insufficient documentation

## 2015-07-29 DIAGNOSIS — R569 Unspecified convulsions: Secondary | ICD-10-CM | POA: Diagnosis present

## 2015-07-29 DIAGNOSIS — Z8673 Personal history of transient ischemic attack (TIA), and cerebral infarction without residual deficits: Secondary | ICD-10-CM | POA: Diagnosis not present

## 2015-07-29 DIAGNOSIS — Z87891 Personal history of nicotine dependence: Secondary | ICD-10-CM | POA: Diagnosis not present

## 2015-07-29 HISTORY — DX: Parkinson's disease: G20

## 2015-07-29 HISTORY — DX: Parkinson's disease without dyskinesia, without mention of fluctuations: G20.A1

## 2015-07-29 LAB — CBC WITH DIFFERENTIAL/PLATELET
BASOS ABS: 0 10*3/uL (ref 0–0.1)
Basophils Relative: 0 %
EOS ABS: 0.1 10*3/uL (ref 0–0.7)
EOS PCT: 1 %
HCT: 43.5 % (ref 40.0–52.0)
Hemoglobin: 15.1 g/dL (ref 13.0–18.0)
LYMPHS PCT: 17 %
Lymphs Abs: 1.1 10*3/uL (ref 1.0–3.6)
MCH: 32.8 pg (ref 26.0–34.0)
MCHC: 34.7 g/dL (ref 32.0–36.0)
MCV: 94.4 fL (ref 80.0–100.0)
MONO ABS: 1 10*3/uL (ref 0.2–1.0)
Monocytes Relative: 15 %
Neutro Abs: 4.3 10*3/uL (ref 1.4–6.5)
Neutrophils Relative %: 67 %
PLATELETS: 143 10*3/uL — AB (ref 150–440)
RBC: 4.61 MIL/uL (ref 4.40–5.90)
RDW: 14.4 % (ref 11.5–14.5)
WBC: 6.5 10*3/uL (ref 3.8–10.6)

## 2015-07-29 LAB — BASIC METABOLIC PANEL
ANION GAP: 10 (ref 5–15)
BUN: 28 mg/dL — AB (ref 6–20)
CALCIUM: 9 mg/dL (ref 8.9–10.3)
CO2: 23 mmol/L (ref 22–32)
Chloride: 105 mmol/L (ref 101–111)
Creatinine, Ser: 1.2 mg/dL (ref 0.61–1.24)
GFR calc Af Amer: >60 mL/min (ref >60)
GFR, EST NON AFRICAN AMERICAN: 59 mL/min — AB (ref >60)
GLUCOSE: 116 mg/dL — AB (ref 65–99)
Potassium: 3.8 mmol/L (ref 3.5–5.1)
SODIUM: 138 mmol/L (ref 135–145)

## 2015-07-29 LAB — VALPROIC ACID LEVEL: VALPROIC ACID LVL: 99 ug/mL (ref 50.0–100.0)

## 2015-07-29 NOTE — ED Notes (Signed)
Witnessed seizure at home. Per family lasted about 8 minutes. They state this one lasted longer than usual.

## 2015-07-29 NOTE — ED Provider Notes (Signed)
[facility] Emergency Department Provider Note   ____________________________________________  Time seen: Approximately 450 PM  I have reviewed the triage vital signs and the nursing notes.   HISTORY  Chief Complaint Seizures    HPI Ian Gill is a 70 y.o. male with a history of seizures and Parkinson's disease was presenting to emergency Department after seizure. The seizure was witnessed by his family was described as generalized tonic-clonic seizure which lasted up to 8 minutes. The appearance of his seizure was characteristic of his typical seizures except for lasted slightly longer. His last seizure was 2 months ago. He says that he has "several seizures a year." He says the last time he had an adjustment of his medications for his seizures was about 8 months ago. [facility]. Denies feeling ill lately. Denies any pain at this time. Says that he just feels "drained." Family reports the patient did not fall or suffer any trauma as a result of the seizure. They also report that he has a history of COPD and has been on oxygen in the past but says has been "borderline" with his oxygen saturations and so does not use oxygen anymore.   Past Medical History  Diagnosis Date  . Seizures (HCC)   . Depression   . Anxiety   . COPD (chronic obstructive pulmonary disease) (HCC)   . Stroke (HCC)   . Parkinson's disease (HCC)     There are no active problems to display for this patient.   Past Surgical History  Procedure Laterality Date  . Total knee arthroplasty    . Shoulder surgery    . Spinal fusion      Current Outpatient Rx  Name  Route  Sig  Dispense  Refill  . atenolol (TENORMIN) 50 MG tablet   Oral   Take 1 tablet (50 mg total) by mouth daily.   60 tablet   0   . atorvastatin (LIPITOR) 20 MG tablet   Oral   Take 1 tablet (20 mg total) by mouth daily.   60 tablet   0   . clopidogrel (PLAVIX) 75 MG tablet    Oral   Take 1 tablet (75 mg total) by mouth daily.   60 tablet   0   . divalproex (DEPAKOTE) 250 MG DR tablet   Oral   Take 3-4 tablets (750-1,000 mg total) by mouth 2 (two) times daily. 3 tablets in the morning and 4 tablets at night.   220 tablet   2   . levETIRAcetam (KEPPRA) 500 MG tablet   Oral   Take 3 tablets (1,500 mg total) by mouth 2 (two) times daily.   180 tablet   2   . lisinopril (PRINIVIL,ZESTRIL) 30 MG tablet   Oral   Take 1 tablet (30 mg total) by mouth daily.   60 tablet   0   . Melatonin 10 MG TABS   Oral   Take 1 tablet by mouth at bedtime.   60 tablet   0   . Omega 3 1000 MG CAPS   Oral   Take 1 capsule (1,000 mg total) by mouth daily.   90 each   0   . ranitidine (ZANTAC) 150 MG tablet   Oral   Take 1 tablet (150 mg total) by mouth daily.   60 tablet   0   . sertraline (ZOLOFT) 100 MG tablet   Oral   Take 1.5 tablets (150 mg  total) by mouth daily.   90 tablet   0   . traMADol (ULTRAM) 50 MG tablet   Oral   Take 1 tablet by mouth daily.         . Vitamin D, Cholecalciferol, 1000 units TABS   Oral   Take 1 tablet by mouth daily.   60 tablet   0     Allergies Wellbutrin  No family history on file.  Social History Social History  Substance Use Topics  . Smoking status: Former Games developermoker  . Smokeless tobacco: Never Used  . Alcohol Use: No    Review of Systems Constitutional: No fever/chills Eyes: No visual changes. ENT: No sore throat. Cardiovascular: Denies chest pain. Respiratory: Denies shortness of breath. Gastrointestinal: No abdominal pain.  No nausea, no vomiting.  No diarrhea.  No constipation. Genitourinary: Negative for dysuria. Musculoskeletal: Negative for back pain. Skin: Negative for rash. Neurological: Negative for headaches, focal weakness or numbness.  10-point ROS otherwise negative.  ____________________________________________   PHYSICAL EXAM:  VITAL SIGNS: ED Triage Vitals  Enc Vitals  Group     BP 07/29/15 1650 149/75 mmHg     Pulse Rate 07/29/15 1650 91     Resp 07/29/15 1650 16     Temp 07/29/15 1650 98.1 F (36.7 C)     Temp Source 07/29/15 1650 Oral     SpO2 07/29/15 1650 91 %     Weight 07/29/15 1650 227 lb (102.967 kg)     Height 07/29/15 1650 6' (1.829 m)     Head Cir --      Peak Flow --      Pain Score 07/29/15 1651 0     Pain Loc --      Pain Edu? --      Excl. in GC? --     Constitutional: Alert and oriented. Well appearing and in no acute distress. Eyes: Conjunctivae are normal. PERRL. EOMI. Head: Atraumatic. Nose: No congestion/rhinnorhea. Mouth/Throat: Mucous membranes are moist.   Neck: No stridor.   Cardiovascular: Normal rate, regular rhythm. Grossly normal heart sounds.   Respiratory: Normal respiratory effort.  No retractions. Lungs CTAB. Gastrointestinal: Soft and nontender. No distention.  Musculoskeletal: No lower extremity tenderness nor edema.  No joint effusions. Neurologic:  Normal speech and language. No gross focal neurologic deficits are appreciated.  Skin:  Skin is warm, dry and intact. No rash noted. Psychiatric: Mood and affect are normal. Speech and behavior are normal.  ____________________________________________   LABS (all labs ordered are listed, but only abnormal results are displayed)  Labs Reviewed  CBC WITH DIFFERENTIAL/PLATELET - Abnormal; Notable for the following:    Platelets 143 (*)    All other components within normal limits  BASIC METABOLIC PANEL - Abnormal; Notable for the following:    Glucose, Bld 116 (*)    BUN 28 (*)    GFR calc non Af Amer 59 (*)    All other components within normal limits  VALPROIC ACID LEVEL   ____________________________________________  EKG  ED ECG REPORT I, Arelia LongestSchaevitz,  David M, the attending physician, personally viewed and interpreted this ECG.   Date: 07/29/2015  EKG Time: 1647  Rate: 92  Rhythm: normal sinus rhythm  Axis: Normal  Intervals:none  ST&T  Change: No ST segment elevation or depression. Biphasic T waves in lead V2 through V4. No significant change from previous EKG of this past June 2017.Marland Kitchen. No ST segment elevation or depression.  ____________________________________________  RADIOLOGY   ____________________________________________  PROCEDURES   ____________________________________________   INITIAL IMPRESSION / ASSESSMENT AND PLAN / ED COURSE  Pertinent labs & imaging results that were available during my care of the patient were reviewed by me and considered in my medical decision making (see chart for details).  ----------------------------------------- 7:12 PM on 07/29/2015 -----------------------------------------  Patient found to be therapeutic on his valproic acid. Pulse rate of 77. I believe the pulse document notation of 30 is an error. Patient continues without any pain. Says that he feels a strange aura but denies any confusion at this time. I spoke with neurology, Dr. Loretha Brasil, says did not change any of the patient's medications at this time. Recommends follow-up as an outpatient with his neurologist at [facility]. I did speak with the patient and offered him admission because of the strange aura he is feeling. Denies any focal weakness at this time. He says that he would rather go home and return for any worsening of his symptoms or repeat seizures. Unclear cause of the patient's seizure beyond his ongoing seizure disease. Family at the bedside. Family as well as the patient understand the plan and are willing to comply. ____________________________________________   FINAL CLINICAL IMPRESSION(S) / ED DIAGNOSES  Seizure.    NEW MEDICATIONS STARTED DURING THIS VISIT:  New Prescriptions   No medications on file     Note:  This document was prepared using Dragon voice recognition software and may include unintentional dictation errors.    Myrna Blazer, MD 07/29/15 [id number]

## 2015-07-29 NOTE — Discharge Instructions (Signed)

## 2016-05-14 ENCOUNTER — Ambulatory Visit: Payer: Medicare Other | Attending: Internal Medicine

## 2016-05-14 DIAGNOSIS — G4733 Obstructive sleep apnea (adult) (pediatric): Secondary | ICD-10-CM | POA: Diagnosis not present

## 2016-05-14 DIAGNOSIS — G4761 Periodic limb movement disorder: Secondary | ICD-10-CM | POA: Insufficient documentation

## 2016-05-14 DIAGNOSIS — G4710 Hypersomnia, unspecified: Secondary | ICD-10-CM | POA: Diagnosis present

## 2016-06-02 ENCOUNTER — Ambulatory Visit: Payer: Medicare Other | Attending: Neurology | Admitting: Speech Pathology

## 2016-06-02 ENCOUNTER — Encounter: Payer: Self-pay | Admitting: Physical Therapy

## 2016-06-02 ENCOUNTER — Ambulatory Visit: Payer: Medicare Other | Admitting: Occupational Therapy

## 2016-06-02 ENCOUNTER — Ambulatory Visit: Payer: Medicare Other | Admitting: Physical Therapy

## 2016-06-02 DIAGNOSIS — G20A1 Parkinson's disease without dyskinesia, without mention of fluctuations: Secondary | ICD-10-CM

## 2016-06-02 DIAGNOSIS — M6281 Muscle weakness (generalized): Secondary | ICD-10-CM | POA: Insufficient documentation

## 2016-06-02 DIAGNOSIS — G20 Parkinson's disease: Secondary | ICD-10-CM | POA: Diagnosis not present

## 2016-06-02 DIAGNOSIS — R262 Difficulty in walking, not elsewhere classified: Secondary | ICD-10-CM

## 2016-06-02 NOTE — Therapy (Signed)
[place] [facility] [address] [place], [place], [address] Phone: [phone]   Fax:  [phone]  Patient Details  Name: Ian MortonWilliam R Nessler MRN: [id number] Date of Birth: 01-26-1946 Referring Provider:  Lonell FaceShah, Hemang K, MD  Encounter Date: 06/02/2016   Leandrew KoyanagiAbernathy, Susie 06/02/2016, 2:36 PM  [place] [facility] [address] [place], [place], [address] Phone: [phone]   Fax:  [phone]   SPEECH LANGUAGE PATHOLOGY SCREENING  The patient was scheduled for LSVT LOUD and BIG.  The patient is not demonstrating need for LSVT LOUD.  His speech is fully intelligible and audible.  He is able to project his voice with loudness when needed.  He reports no difficulty being understood by others.  The patient was informed of vocal symptoms to be aware of.  The patient does not require skilled speech therapy at this time.  Dollene PrimroseSusan G Cia Garretson, MS/CCC- SLP

## 2016-06-02 NOTE — Therapy (Signed)
[place] [facility] MAIN [facility] SERVICES [address] [place], [place], [address] Phone: [phone]   Fax:  [phone]  Physical Therapy Evaluation  Patient Details  Name: Ian Gill MRN: [id number] Date of Birth: 1945-12-29 Referring Provider: Cristopher Peru K  Encounter Date: 06/02/2016      PT End of Session - 06/02/16 1320    Visit Number 1   Number of Visits 17   Date for PT Re-Evaluation 06/30/16   PT Start Time 1300   PT Stop Time 1400   PT Time Calculation (min) 60 min   Equipment Utilized During Treatment Gait belt   Activity Tolerance Patient tolerated treatment well;Patient limited by fatigue      Past Medical History:  Diagnosis Date  . Anxiety   . COPD (chronic obstructive pulmonary disease) (HCC)   . Depression   . Parkinson's disease (HCC)   . Seizures (HCC)   . Stroke [facility])     Past Surgical History:  Procedure Laterality Date  . Shoulder surgery    . SPINAL FUSION    . TOTAL KNEE ARTHROPLASTY      There were no vitals filed for this visit.       Subjective Assessment - 06/02/16 1310    Subjective Patient has a history of falls and his balance is unsteady.    Pertinent History Patient is having falls that started 2 years ago. Patient moved into [place] apartments in August 2018. He began using the rollator 1 year ago. He has had falls and stumbles and walks too fast and runs into things. He has episods of stumbling 3- 4 times a week.    Limitations Standing;Walking   How long can you stand comfortably? 10 minutes   How long can you walk comfortably? 10 minutes   Patient Stated Goals to walk without getting fatigued .    Currently in Pain? No/denies   Multiple Pain Sites No            [facility] PT Assessment - 06/02/16 1315      Assessment   Medical Diagnosis LSVT BIG AND LOUD   Referring Provider [facility], [facility] K   Onset Date/Surgical Date 03/24/16   Hand Dominance Right   Prior Therapy no     Precautions   Precautions Fall     Restrictions   Weight Bearing Restrictions No     Balance Screen   Has the patient fallen in the past 6 months Yes   Has the patient had a decrease in activity level because of a fear of falling?  Yes   Is the patient reluctant to leave their home because of a fear of falling?  No     Home Environment   Living Environment Assisted living   Home Biochemist, clinical - 4 wheels     Prior Function   Level of Independence Independent with household mobility with device   Vocation Retired   Leisure TV     Cognition   Overall Cognitive Status Within Functional Limits for tasks assessed   Attention Focused       PAIN: Patient has no reports of pain  POSTURE: forward flexed psoture   PROM/AROM: WFL BUE and BLE  STRENGTH:  Graded on a 0-5 scale Muscle Group Left Right                          Hip Flex 5/5 4/5  Hip Abd 5/5 4/5  Hip  Add 5/5 4/5  Hip Ext 5/5 4/5  Hip IR/ER 5/5 4/5  Knee Flex 5/5 5/5  Knee Ext 5/5 5/5  Ankle DF 5/5 5/5  Ankle PF 5/5 5/5   SENSATION: WFL BUE and BLE    FUNCTIONAL MOBILITY: independent but not safe with use of hands for ascending or descending and has uncontrolled sit to stand transfers    BALANCE: Poor dynamic standing balance and fair static standing balance   GAIT: Ambulates with rollator   OUTCOME MEASURES: TEST Outcome Interpretation  5 times sit<>stand 26.80sec >43 yo, >15 sec indicates increased risk for falls  10 meter walk test       1.01          m/s <1.0 m/s indicates increased risk for falls; limited community ambulator  Timed up and Go   20.69              sec <14 sec indicates increased risk for falls  6 minute walk test 500 feet               Feet 1000 feet is community Financial controller 38/56 <36/56 (100% risk for falls), 37-45 (80% risk for falls); 46-51 (>50% risk for falls); 52-55 (lower risk <25% of falls)  9 Hole Peg Test L:  35.86              R: 37/94  Normal  for >71 years male:  Left 25.79  Right 25.95                         PT Education - 06/02/16 1319    Education provided Yes   Education Details plan of care   Person(s) Educated Patient   Methods Explanation   Comprehension Verbalized understanding             PT Long Term Goals - 06/02/16 1411      PT LONG TERM GOAL #1   Title Patient will be independent in home exercise program to improve strength/mobility for better functional independence with ADLs.   Time 4   Period Weeks   Status New     PT LONG TERM GOAL #2   Title Patient (> 15 years old) will complete five times sit to stand test in < 15 seconds indicating an increased LE strength and improved balance.   Baseline 26.80 sec   Time 4   Period Weeks   Status New     PT LONG TERM GOAL #3   Title Patient will increase Berg Balance score by > 6 points to demonstrate decreased fall risk during functional activities.   Baseline 38/56   Time 4   Period Weeks   Status New     PT LONG TERM GOAL #4   Title Patient will increase six minute walk test distance to >1000 for progression to community ambulator and improve gait ability   Baseline 500 feet   Time 4   Period Weeks   Status New     PT LONG TERM GOAL #5   Title Patient will increase 10 meter walk test to >1.67m/s as to improve gait speed for better community ambulation and to reduce fall risk   Baseline 1.01 m/sec   Time 4   Period Weeks   Status New     Additional Long Term Goals   Additional Long Term Goals Yes     PT LONG TERM GOAL #7   Title Patient will  improve coordination B hands to improve ability to use phone and computer indicated by a decrease of 9 hole peg test of 5 seconds.   Baseline left 35.86, 37. 94 right sec   Time 4   Period Weeks   Status New               Plan - 06/02/16 1404    Clinical Impression Statement Patient presents with history of frequent falls, and  stumbling with ambulation using a  rollator. He has poor dynamic standing balance and fair static standing balance. He transfers from sit to stand with unsafe mobiility using rollator and his legs for stability to control his speed. He has difficulty with gait distances including intermediate and long distances and has uneven path, poor posture with flexed trunk and shuffling feet with increased distances due to fatigue. He has decreased coordinaiton of BUE hands with decreased 9 hole peg test; and also has increased falls risk indicated by decreased outcome measures. He will benefit from skilled PT to improve balance and coordination and safety with mobility.    Rehab Potential Good   Clinical Impairments Affecting Rehab Potential This patient presents with 2, personal factors/ comorbidities including falls, and current situation., and 4  body elements including body structures and functions, activity limitations and or participation restrictions:  unsteady gait, unsteady transfers, decreased standing balance, decreased gait.  Patient's condition is evolving.   PT Frequency 4x / week   PT Duration 4 weeks   PT Treatment/Interventions Gait training;Stair training;Neuromuscular re-education;Balance training;Therapeutic exercise;Therapeutic activities;Patient/family education   PT Next Visit Plan LSVT BIG   PT Home Exercise Plan LSVT BIG   Consulted and Agree with Plan of Care Patient      Patient will benefit from skilled therapeutic intervention in order to improve the following deficits and impairments:  Abnormal gait, Decreased balance, Decreased endurance, Decreased mobility, Difficulty walking, Cardiopulmonary status limiting activity, Decreased activity tolerance, Decreased safety awareness, Decreased strength, Postural dysfunction, Decreased coordination, Obesity  Visit Diagnosis: Difficulty in walking, not elsewhere classified - Plan: PT plan of care cert/re-cert  Muscle weakness (generalized) - Plan: PT plan of care  cert/re-cert      G-Codes - 06/02/16 1413    Functional Assessment Tool Used (Outpatient Only) 5 x sit tostand, tug, 6 MW, berg balance   Functional Limitation Mobility: Walking and moving around   Mobility: Walking and Moving Around Current Status ([id number]) At least 60 percent but less than 80 percent impaired, limited or restricted   Mobility: Walking and Moving Around Goal Status ([id number]) At least 40 percent but less than 60 percent impaired, limited or restricted       Problem List There are no active problems to display for this patient.  Ezekiel InaKristine S Mashonda Broski, PT, DPT [place], [place] 06/02/2016, 4:02 PM  [facility] [address] [place], [place], [address] Phone: [phone]   Fax:  [phone]  Name: Hedwig MortonWilliam R Salvetti MRN: [id number] Date of Birth: [birth year]

## 2016-06-09 ENCOUNTER — Encounter: Payer: No Typology Code available for payment source | Admitting: Speech Pathology

## 2016-06-09 ENCOUNTER — Ambulatory Visit: Payer: Medicare Other | Admitting: Physical Therapy

## 2016-06-09 ENCOUNTER — Encounter: Payer: No Typology Code available for payment source | Admitting: Occupational Therapy

## 2016-06-09 ENCOUNTER — Encounter: Payer: Self-pay | Admitting: Physical Therapy

## 2016-06-09 DIAGNOSIS — R262 Difficulty in walking, not elsewhere classified: Secondary | ICD-10-CM

## 2016-06-09 DIAGNOSIS — M6281 Muscle weakness (generalized): Secondary | ICD-10-CM

## 2016-06-09 DIAGNOSIS — G20 Parkinson's disease: Secondary | ICD-10-CM | POA: Diagnosis not present

## 2016-06-09 NOTE — Therapy (Addendum)
[facility] MAIN [facility] SERVICES [address] [place], [place], [address] Phone: [phone]   Fax:  [phone]  Physical Therapy Treatment  Patient Details  Name: Ian Gill MRN: [id number] Date of Birth: Oct 20, 1945 Referring Provider: Cristopher Peru K  Encounter Date: 06/09/2016      PT End of Session - 06/09/16 1314    Visit Number 2   Number of Visits 17   Date for PT Re-Evaluation 06/30/16   PT Start Time 0103   PT Stop Time 0200   PT Time Calculation (min) 57 min   Equipment Utilized During Treatment Gait belt   Activity Tolerance Patient tolerated treatment well;Patient limited by fatigue   Behavior During Therapy [facility] for tasks assessed/performed      Past Medical History:  Diagnosis Date  . Anxiety   . COPD (chronic obstructive pulmonary disease) (HCC)   . Depression   . Parkinson's disease (HCC)   . Seizures (HCC)   . Stroke [facility])     Past Surgical History:  Procedure Laterality Date  . Shoulder surgery    . SPINAL FUSION    . TOTAL KNEE ARTHROPLASTY      There were no vitals filed for this visit.      Subjective Assessment - 06/09/16 1312    Subjective Patient has a history of falls and his balance is unsteady. He is ready to begin his LSVT BIG exercises today.    Pertinent History Patient is having falls that started 2 years ago. Patient moved into [place] apartments in August 2018. He began using the rollator 1 year ago. He has had falls and stumbles and walks too fast and runs into things. He has episods of stumbling 3- 4 times a week.    Limitations Standing;Walking   How long can you stand comfortably? 10 minutes   How long can you walk comfortably? 10 minutes   Patient Stated Goals to walk without getting fatigued .    Currently in Pain? No/denies   Multiple Pain Sites No     Treatment:   Patient seen for Adapted LSVT Daily Session Maximal Daily Exercises for facilitation/coordination of movement  Sustained movements are designed to rescale the amplitude of movement output for generalization to daily functional activities .Performed as follows for 1 set of 10 repetitions each multidirectional sustained movements  1) Floor to ceiling  2) Side to side multidirectional Repetitive movements performed in standing and are designed to provide retraining effort needed for sustained muscle activation in tasks  3) Step and reach forward  4) Step and reach backwards  5) Step and reach sideways 6) Rock and reach forward/backward  7) Rock and reach sideways Functional tasks: 1. Sit to stand functional component task with supervision 5 reps and simulated activities for:  2. Coordination activities for BUE hands to perform more accurate fine motor tasks such as brushing his teeth and phone use including pegs in peg board 3. Putting on and off coat with cues to push his left UE with increased amphlitude 4. Discussed buttons with shirt but unable to perform due to time constraint 5. Discussed stepping activity for raising his legs up high for improved dynamic standing balance but unable to perform due to time constraint.  Gait training with Rollator and cues for correct distance from AD and correct posture for 200 feet.  Patient responds well to verbal and tactile cues to correct form and technique and needs adapted program for stability and  CGA  for safety with activities.  Cues to increase intensity and amplitude of movements throughout session. Patient has many loss of balance and sits without planning to sit due to loss of balance.                            PT Education - 06/09/16 1313    Education provided Yes   Education Details LSVT BIG   Person(s) Educated Patient   Methods Explanation   Comprehension Verbalized understanding             PT Long Term Goals - 06/02/16 1411      PT LONG TERM GOAL #1   Title Patient will be independent in home exercise program  to improve strength/mobility for better functional independence with ADLs.   Time 4   Period Weeks   Status New     PT LONG TERM GOAL #2   Title Patient (> 54 years old) will complete five times sit to stand test in < 15 seconds indicating an increased LE strength and improved balance.   Baseline 26.80 sec   Time 4   Period Weeks   Status New     PT LONG TERM GOAL #3   Title Patient will increase Berg Balance score by > 6 points to demonstrate decreased fall risk during functional activities.   Baseline 38/56   Time 4   Period Weeks   Status New     PT LONG TERM GOAL #4   Title Patient will increase six minute walk test distance to >1000 for progression to community ambulator and improve gait ability   Baseline 500 feet   Time 4   Period Weeks   Status New     PT LONG TERM GOAL #5   Title Patient will increase 10 meter walk test to >1.66m/s as to improve gait speed for better community ambulation and to reduce fall risk   Baseline 1.01 m/sec   Time 4   Period Weeks   Status New     Additional Long Term Goals   Additional Long Term Goals Yes     PT LONG TERM GOAL #7   Title Patient will improve coordination B hands to improve ability to use phone and computer indicated by a decrease of 9 hole peg test of 5 seconds.   Baseline left 35.86, 37. 94 right sec   Time 4   Period Weeks   Status New               Plan - 06/09/16 1315    Clinical Impression Statement Patient seen for Adapted LSVT Daily Session Maximal Daily Exercises for facilitation/coordination of movement Sustained movements are designed to rescale the amplitude of movement output for generalization to daily functional activities .Performed as follows for 1 set of 10 repetitions each multidirectional sustained movements Max cueing needed to appropriately perform LSVT tasks with leg, hand, and head position. Decreased coordination demonstrated requiring consistent verbal cueing to correct form. Cognitive  understanding of task was delayed. Patient will continue to benefti from skilled PT for LSVT BIG to reach goals.    Rehab Potential Good   Clinical Impairments Affecting Rehab Potential This patient presents with 2, personal factors/ comorbidities including falls, and current situation., and 4  body elements including body structures and functions, activity limitations and or participation restrictions:  unsteady gait, unsteady transfers, decreased standing balance, decreased gait.  Patient's condition is evolving.   PT Frequency  4x / week   PT Duration 4 weeks   PT Treatment/Interventions Gait training;Stair training;Neuromuscular re-education;Balance training;Therapeutic exercise;Therapeutic activities;Patient/family education   PT Next Visit Plan LSVT BIG   PT Home Exercise Plan LSVT BIG   Consulted and Agree with Plan of Care Patient      Patient will benefit from skilled therapeutic intervention in order to improve the following deficits and impairments:  Abnormal gait, Decreased balance, Decreased endurance, Decreased mobility, Difficulty walking, Cardiopulmonary status limiting activity, Decreased activity tolerance, Decreased safety awareness, Decreased strength, Postural dysfunction, Decreased coordination, Obesity  Visit Diagnosis: Difficulty in walking, not elsewhere classified  Muscle weakness (generalized)  Parkinson's disease (HCC)     Problem List There are no active problems to display for this patient. Ezekiel InaKristine S Dionisios Ricci, PT, DPT  [place] 06/09/2016, 1:39 PM  [facility] MAIN [facility] [address] [place], [place], [address] Phone: [phone]   Fax:  [phone]  Name: Hedwig MortonWilliam R Discher MRN: [id number] Date of Birth: 05/29/1945

## 2016-06-10 ENCOUNTER — Encounter: Payer: No Typology Code available for payment source | Admitting: Speech Pathology

## 2016-06-10 ENCOUNTER — Encounter: Payer: Self-pay | Admitting: Physical Therapy

## 2016-06-10 ENCOUNTER — Encounter: Payer: No Typology Code available for payment source | Admitting: Occupational Therapy

## 2016-06-10 ENCOUNTER — Ambulatory Visit: Payer: Medicare Other | Admitting: Physical Therapy

## 2016-06-10 DIAGNOSIS — G20 Parkinson's disease: Secondary | ICD-10-CM | POA: Diagnosis not present

## 2016-06-10 DIAGNOSIS — M6281 Muscle weakness (generalized): Secondary | ICD-10-CM

## 2016-06-10 DIAGNOSIS — R262 Difficulty in walking, not elsewhere classified: Secondary | ICD-10-CM

## 2016-06-10 NOTE — Therapy (Addendum)
[facility] MAIN [facility] SERVICES [address] [place], [place], [address] Phone: [phone]   Fax:  [phone]  Physical Therapy Treatment  Patient Details  Name: Ian Gill MRN: [id number] Date of Birth: [birth year] Referring Provider: Lonell Face  Encounter Date: 06/10/2016      PT End of Session - 06/10/16 1310    Visit Number 3   Number of Visits 17   Date for PT Re-Evaluation 06/30/16   PT Start Time 0102   PT Stop Time 0200   PT Time Calculation (min) 58 min   Equipment Utilized During Treatment Gait belt   Activity Tolerance Patient tolerated treatment well;Patient limited by fatigue   Behavior During Therapy [facility] for tasks assessed/performed      Past Medical History:  Diagnosis Date  . Anxiety   . COPD (chronic obstructive pulmonary disease) (HCC)   . Depression   . Parkinson's disease (HCC)   . Seizures (HCC)   . Stroke [facility])     Past Surgical History:  Procedure Laterality Date  . Shoulder surgery    . SPINAL FUSION    . TOTAL KNEE ARTHROPLASTY      There were no vitals filed for this visit.      Subjective Assessment - 06/10/16 1309    Subjective Patient has a history of falls and his balance is unsteady. He is ready to begin his LSVT BIG exercises today. He forgot to do his HEP last night.    Pertinent History Patient is having falls that started 2 years ago. Patient moved into [place] apartments in August 2018. He began using the rollator 1 year ago. He has had falls and stumbles and walks too fast and runs into things. He has episods of stumbling 3- 4 times a week.    Limitations Standing;Walking   How long can you stand comfortably? 10 minutes   How long can you walk comfortably? 10 minutes   Patient Stated Goals to walk without getting fatigued .    Currently in Pain? No/denies   Multiple Pain Sites No      Treatment:   Patient seen for Adapted LSVT Daily Session Maximal Daily Exercises for  facilitation/coordination of movement Sustained movements are designed to rescale the amplitude of movement output for generalization to daily functional activities .Performed as follows for 1 set of 10 repetitions each multidirectional sustained movements  1) Floor to ceiling  2) Side to side multidirectional Repetitive movements performed in standing and are designed to provide retraining effort needed for sustained muscle activation in tasks  3) Step and reach forward  4) Step and reach backwards  5) Step and reach sideways 6) Rock and reach forward/backward  7) Rock and reach sideways Functional tasks: 1. Sit to stand functional component task with supervision 5 reps and simulated activities for:  2. Coordination activities for BUE hands to perform more accurate fine motor tasks such as brushing his teeth and phone use including pegs in peg board 3. Putting on and off shirt with cues to push his left UE with increased amphlitude, and to begin with LUE into sleeve 4. Performed buttons with shirt x 5 with finger flicks 5. Discussed stepping activity for raising his legs up high for improved dynamic standing balance but unable to perform due to time constraint.  Gait training with Rollator and cues for correct distance from AD and correct posture for 200 feet.  Cuing is needed to stretch the leg out in seated  side reaching.Fatigue with sit to stand but demonstrating more control Max cueing needed to appropriately perform LSVT tasks with leg, hand, and head position.                           PT Education - 06/10/16 1310    Education provided Yes   Education Details LSVT BIG HEP   Person(s) Educated Patient   Methods Explanation   Comprehension Verbalized understanding             PT Long Term Goals - 06/02/16 1411      PT LONG TERM GOAL #1   Title Patient will be independent in home exercise program to improve strength/mobility for better functional  independence with ADLs.   Time 4   Period Weeks   Status New     PT LONG TERM GOAL #2   Title Patient (> 72 years old) will complete five times sit to stand test in < 15 seconds indicating an increased LE strength and improved balance.   Baseline 26.80 sec   Time 4   Period Weeks   Status New     PT LONG TERM GOAL #3   Title Patient will increase Berg Balance score by > 6 points to demonstrate decreased fall risk during functional activities.   Baseline 38/56   Time 4   Period Weeks   Status New     PT LONG TERM GOAL #4   Title Patient will increase six minute walk test distance to >1000 for progression to community ambulator and improve gait ability   Baseline 500 feet   Time 4   Period Weeks   Status New     PT LONG TERM GOAL #5   Title Patient will increase 10 meter walk test to >1.4848m/s as to improve gait speed for better community ambulation and to reduce fall risk   Baseline 1.01 m/sec   Time 4   Period Weeks   Status New     Additional Long Term Goals   Additional Long Term Goals Yes     PT LONG TERM GOAL #7   Title Patient will improve coordination B hands to improve ability to use phone and computer indicated by a decrease of 9 hole peg test of 5 seconds.   Baseline left 35.86, 37. 94 right sec   Time 4   Period Weeks   Status New               Plan - 06/10/16 1310    Clinical Impression Statement Patient seen for Adapted LSVT Daily Session Maximal Daily Exercises for facilitation/coordination of movement Sustained movements are designed to rescale the amplitude of movement output for generalization to daily functional activities .Performed as follows for 1 set of 10 repetitions each multidirectional sustained movements Max cueing needed to appropriately perform LSVT tasks with leg, hand, and head position. Decreased coordination demonstrated requiring consistent verbal cueing to correct form. Cognitive understanding of task was delayed. Patient will  continue to benefit from skilled PT for LSVT BIG to reach goals.   Rehab Potential Good   Clinical Impairments Affecting Rehab Potential This patient presents with 2, personal factors/ comorbidities including falls, and current situation., and 4  body elements including body structures and functions, activity limitations and or participation restrictions:  unsteady gait, unsteady transfers, decreased standing balance, decreased gait.  Patient's condition is evolving.   PT Frequency 4x / week   PT Duration 4 weeks  PT Treatment/Interventions Gait training;Stair training;Neuromuscular re-education;Balance training;Therapeutic exercise;Therapeutic activities;Patient/family education   PT Next Visit Plan LSVT BIG   PT Home Exercise Plan LSVT BIG   Consulted and Agree with Plan of Care Patient      Patient will benefit from skilled therapeutic intervention in order to improve the following deficits and impairments:  Abnormal gait, Decreased balance, Decreased endurance, Decreased mobility, Difficulty walking, Cardiopulmonary status limiting activity, Decreased activity tolerance, Decreased safety awareness, Decreased strength, Postural dysfunction, Decreased coordination, Obesity  Visit Diagnosis: Difficulty in walking, not elsewhere classified  Muscle weakness (generalized)  Parkinson's disease (HCC)     Problem List There are no active problems to display for this patient. Ezekiel Ina, PT, DPT [place], [place] 06/10/2016, 1:14 PM  [facility] MAIN [facility] SERVICES [address] [place], [place], [address] Phone: [phone]   Fax:  [phone]  Name: ACEYN KATHOL MRN: [id number] Date of Birth: January 06, 1946

## 2016-06-11 ENCOUNTER — Encounter: Payer: Self-pay | Admitting: Physical Therapy

## 2016-06-11 ENCOUNTER — Encounter: Payer: No Typology Code available for payment source | Admitting: Occupational Therapy

## 2016-06-11 ENCOUNTER — Encounter: Payer: No Typology Code available for payment source | Admitting: Speech Pathology

## 2016-06-11 ENCOUNTER — Ambulatory Visit: Payer: Medicare Other | Admitting: Physical Therapy

## 2016-06-11 DIAGNOSIS — M6281 Muscle weakness (generalized): Secondary | ICD-10-CM

## 2016-06-11 DIAGNOSIS — G20 Parkinson's disease: Secondary | ICD-10-CM

## 2016-06-11 DIAGNOSIS — R262 Difficulty in walking, not elsewhere classified: Secondary | ICD-10-CM

## 2016-06-11 NOTE — Therapy (Addendum)
[place] [facility] [address] [place], [place], [address] Phone: [phone]   Fax:  [phone]  Physical Therapy Treatment  Patient Details  Name: Ian MortonWilliam R Gill MRN: [id number] Date of Birth: 02/18/1945 Referring Provider: Lonell FaceSHAH, HEMANG K  Encounter Date: 06/11/2016      PT End of Session - 06/11/16 1305    Visit Number 4   Number of Visits 17   Date for PT Re-Evaluation 06/30/16   PT Start Time 0101   PT Stop Time 0200   PT Time Calculation (min) 59 min   Equipment Utilized During Treatment Gait belt   Activity Tolerance Patient tolerated treatment well;Patient limited by fatigue   Behavior During Therapy [facility] for tasks assessed/performed      Past Medical History:  Diagnosis Date  . Anxiety   . COPD (chronic obstructive pulmonary disease) (HCC)   . Depression   . Parkinson's disease (HCC)   . Seizures (HCC)   . Stroke [facility](HCC)     Past Surgical History:  Procedure Laterality Date  . Shoulder surgery    . SPINAL FUSION    . TOTAL KNEE ARTHROPLASTY      There were no vitals filed for this visit.      Subjective Assessment - 06/11/16 1304    Subjective Patient has a history of falls and his balance is unsteady. He is ready to begin his LSVT BIG exercises today. He did  his HEP last night.    Pertinent History Patient is having falls that started 2 years ago. Patient moved into [place] apartments in August 2018. He began using the rollator 1 year ago. He has had falls and stumbles and walks too fast and runs into things. He has episods of stumbling 3- 4 times a week.    Limitations Standing;Walking   How long can you stand comfortably? 10 minutes   How long can you walk comfortably? 10 minutes   Patient Stated Goals to walk without getting fatigued .    Currently in Pain? No/denies   Multiple Pain Sites No      Treatment:  Patient seen for Adapted  LSVT Daily Session Maximal Daily Exercises for  facilitation/coordination of movement Sustained movements are designed to rescale the amplitude of movement output for generalization to daily functional activities .Performed as follows for 1 set of 10 repetitions each multidirectional sustained movements  1) Floor to ceiling  2) Side to side multidirectional Repetitive movements performed in standing and are designed to provide retraining effort needed for sustained muscle activation in tasks  3) Step and reach forward  4) Step and reach backwards  5) Step and reach sideways 6) Rock and reach forward/backward  7) Rock and reach sideways Functional tasks: 1. Sit to stand functional component task with supervision 5 reps, cues to stand up straight and begin with hands up;   Simulated activities for:  2. Coordination activities for BUE hands to perform more accurate fine motor tasks such as brushing his teeth and phone use including pegs in peg board, unable to perform due to time constraints 3. Putting on and off shirt with cues to push his left UE with increased amphlitude, and to begin with LUE into sleeve and "whip shirt around back" and mod assist from PT , except one without assistance 4. Performed buttons with shirt x 5 with finger flicks x 3 5. TAPPING  activity for raising his legs up high for improved dynamic standing balance and getting legs  up on his step stool for getting into bed  Gait training with Rollator and cues for correct distance from AD and correct posture for 200 feet.  Cuing is needed to stretch the leg out in seated side reaching, patient was able to identify when he did not have correct positions and correct start and finish positions. Fatigue with sit to stand but demonstrating more control .  Patient needs 2 - 3 repetitions before he is able to perform the exercises with correct technique. Max cueing needed to appropriately perform LSVT tasks with leg, hand, and head  position.                            PT Education - 06/11/16 1305    Education provided Yes   Education Details LSVT BIG   Person(s) Educated Patient   Methods Explanation   Comprehension Verbalized understanding;Returned demonstration;Verbal cues required             PT Long Term Goals - 06/02/16 1411      PT LONG TERM GOAL #1   Title Patient will be independent in home exercise program to improve strength/mobility for better functional independence with ADLs.   Time 4   Period Weeks   Status New     PT LONG TERM GOAL #2   Title Patient (> 62 years old) will complete five times sit to stand test in < 15 seconds indicating an increased LE strength and improved balance.   Baseline 26.80 sec   Time 4   Period Weeks   Status New     PT LONG TERM GOAL #3   Title Patient will increase Berg Balance score by > 6 points to demonstrate decreased fall risk during functional activities.   Baseline 38/56   Time 4   Period Weeks   Status New     PT LONG TERM GOAL #4   Title Patient will increase six minute walk test distance to >1000 for progression to community ambulator and improve gait ability   Baseline 500 feet   Time 4   Period Weeks   Status New     PT LONG TERM GOAL #5   Title Patient will increase 10 meter walk test to >1.15m/s as to improve gait speed for better community ambulation and to reduce fall risk   Baseline 1.01 m/sec   Time 4   Period Weeks   Status New     Additional Long Term Goals   Additional Long Term Goals Yes     PT LONG TERM GOAL #7   Title Patient will improve coordination B hands to improve ability to use phone and computer indicated by a decrease of 9 hole peg test of 5 seconds.   Baseline left 35.86, 37. 94 right sec   Time 4   Period Weeks   Status New               Plan - 06/11/16 1306    Clinical Impression Statement Patient continues to demonstrates some incoordination of movement with select  exercises such as side stepping and stepping backwards with adapted LSVT BIG. Patient responds well to verbal and tactile cues to correct form and technique. Patient is able to catch mistakes in technique with incorrect positions and needs cues to  remember the start and finish positions. Patient's motor control of LE is improving with muscle fatigue. He has complaints of left shoulder tightness present and mild pain  in back and left shoulder   Patient will continue to benefit from skilled PT to improve strength, balance and gait.    Rehab Potential Good   Clinical Impairments Affecting Rehab Potential This patient presents with 2, personal factors/ comorbidities including falls, and current situation., and 4  body elements including body structures and functions, activity limitations and or participation restrictions:  unsteady gait, unsteady transfers, decreased standing balance, decreased gait.  Patient's condition is evolving.   PT Frequency 4x / week   PT Duration 4 weeks   PT Treatment/Interventions Gait training;Stair training;Neuromuscular re-education;Balance training;Therapeutic exercise;Therapeutic activities;Patient/family education   PT Next Visit Plan LSVT BIG   PT Home Exercise Plan LSVT BIG   Consulted and Agree with Plan of Care Patient      Patient will benefit from skilled therapeutic intervention in order to improve the following deficits and impairments:  Abnormal gait, Decreased balance, Decreased endurance, Decreased mobility, Difficulty walking, Cardiopulmonary status limiting activity, Decreased activity tolerance, Decreased safety awareness, Decreased strength, Postural dysfunction, Decreased coordination, Obesity  Visit Diagnosis: Difficulty in walking, not elsewhere classified  Muscle weakness (generalized)  Parkinson's disease (HCC)     Problem List There are no active problems to display for this patient.  Ezekiel Ina, PT, DPT [place], [place]  S 06/11/2016, 1:08 PM  [facility] MAIN [facility] SERVICES [address] [place], [place], [address] Phone: [phone]   Fax:  [phone]  Name: Ian Gill MRN: [id number] Date of Birth: [birth year]

## 2016-06-12 ENCOUNTER — Encounter: Payer: No Typology Code available for payment source | Admitting: Speech Pathology

## 2016-06-12 ENCOUNTER — Encounter: Payer: Self-pay | Admitting: Physical Therapy

## 2016-06-12 ENCOUNTER — Ambulatory Visit: Payer: Medicare Other | Admitting: Physical Therapy

## 2016-06-12 ENCOUNTER — Encounter: Payer: No Typology Code available for payment source | Admitting: Occupational Therapy

## 2016-06-12 DIAGNOSIS — R262 Difficulty in walking, not elsewhere classified: Secondary | ICD-10-CM

## 2016-06-12 DIAGNOSIS — M6281 Muscle weakness (generalized): Secondary | ICD-10-CM

## 2016-06-12 DIAGNOSIS — G20 Parkinson's disease: Secondary | ICD-10-CM | POA: Diagnosis not present

## 2016-06-12 NOTE — Therapy (Signed)
[facility] MAIN [facility] SERVICES [address] [place], [place], [address] Phone: [phone]   Fax:  [phone]  Physical Therapy Treatment  Patient Details  Name: Ian Gill MRN: [id number] Date of Birth: 1945-04-28 Referring Provider: Cristopher Peru K  Encounter Date: 06/11/2016      PT End of Session - 06/12/16 1311    Visit Number 5   Number of Visits 17   Date for PT Re-Evaluation 06/30/16   Authorization Type 5/10   PT Start Time 0104   PT Stop Time 0200   PT Time Calculation (min) 56 min   Equipment Utilized During Treatment Gait belt   Activity Tolerance Patient tolerated treatment well;Patient limited by fatigue   Behavior During Therapy [facility] for tasks assessed/performed      Past Medical History:  Diagnosis Date  . Anxiety   . COPD (chronic obstructive pulmonary disease) (HCC)   . Depression   . Parkinson's disease (HCC)   . Seizures (HCC)   . Stroke [facility])     Past Surgical History:  Procedure Laterality Date  . Shoulder surgery    . SPINAL FUSION    . TOTAL KNEE ARTHROPLASTY      There were no vitals filed for this visit.      Subjective Assessment - 06/12/16 1310    Subjective Patient has a history of falls and his balance is unsteady. He is ready to begin his LSVT BIG exercises today. He did  his HEP last night.    Pertinent History Patient is having falls that started 2 years ago. Patient moved into [place] apartments in August 2018. He began using the rollator 1 year ago. He has had falls and stumbles and walks too fast and runs into things. He has episods of stumbling 3- 4 times a week.    Limitations Standing;Walking   How long can you stand comfortably? 10 minutes   How long can you walk comfortably? 10 minutes   Patient Stated Goals to walk without getting fatigued .    Currently in Pain? No/denies   Multiple Pain Sites No                                 PT Education -  06/12/16 1311    Education provided Yes   Education Details LSVT BIG   Person(s) Educated Patient   Methods Explanation   Comprehension Verbalized understanding             PT Long Term Goals - 06/02/16 1411      PT LONG TERM GOAL #1   Title Patient will be independent in home exercise program to improve strength/mobility for better functional independence with ADLs.   Time 4   Period Weeks   Status New     PT LONG TERM GOAL #2   Title Patient (> 60 years old) will complete five times sit to stand test in < 15 seconds indicating an increased LE strength and improved balance.   Baseline 26.80 sec   Time 4   Period Weeks   Status New     PT LONG TERM GOAL #3   Title Patient will increase Berg Balance score by > 6 points to demonstrate decreased fall risk during functional activities.   Baseline 38/56   Time 4   Period Weeks   Status New     PT LONG TERM GOAL #4   Title  Patient will increase six minute walk test distance to >1000 for progression to community ambulator and improve gait ability   Baseline 500 feet   Time 4   Period Weeks   Status New     PT LONG TERM GOAL #5   Title Patient will increase 10 meter walk test to >1.2750m/s as to improve gait speed for better community ambulation and to reduce fall risk   Baseline 1.01 m/sec   Time 4   Period Weeks   Status New     Additional Long Term Goals   Additional Long Term Goals Yes     PT LONG TERM GOAL #7   Title Patient will improve coordination B hands to improve ability to use phone and computer indicated by a decrease of 9 hole peg test of 5 seconds.   Baseline left 35.86, 37. 94 right sec   Time 4   Period Weeks   Status New               Plan - 06/12/16 1312    Clinical Impression Statement Patient is able to get his coat on correctly and faster with new method and using large amplitude motions. Patient is able to perform his exercises with modeling and verbal correction and cueing to step in  the correct position. Patient need cueing for BIG arm swing, Patient needs cueing for BIG swing and adding dual cognitive loads and distracters.Patient will benefit from continued skilled PT to improve balance, and mobility.    Rehab Potential Good   Clinical Impairments Affecting Rehab Potential This patient presents with 2, personal factors/ comorbidities including falls, and current situation., and 4  body elements including body structures and functions, activity limitations and or participation restrictions:  unsteady gait, unsteady transfers, decreased standing balance, decreased gait.  Patient's condition is evolving.   PT Frequency 4x / week   PT Duration 4 weeks   PT Treatment/Interventions Gait training;Stair training;Neuromuscular re-education;Balance training;Therapeutic exercise;Therapeutic activities;Patient/family education   PT Next Visit Plan LSVT BIG   PT Home Exercise Plan LSVT BIG   Consulted and Agree with Plan of Care Patient      Patient will benefit from skilled therapeutic intervention in order to improve the following deficits and impairments:  Abnormal gait, Decreased balance, Decreased endurance, Decreased mobility, Difficulty walking, Cardiopulmonary status limiting activity, Decreased activity tolerance, Decreased safety awareness, Decreased strength, Postural dysfunction, Decreased coordination, Obesity  Visit Diagnosis: Difficulty in walking, not elsewhere classified  Muscle weakness (generalized)  Parkinson's disease (HCC)     Problem List There are no active problems to display for this patient.   Ezekiel InaMansfield, Lanisa Ishler S 06/12/2016, 1:21 PM  [facility] [address] [place], [place], [address] Phone: [phone]   Fax:  [phone]  Name: Ian Gill MRN: [id number] Date of Birth: July 14, 1945

## 2016-06-12 NOTE — Therapy (Signed)
[facility] MAIN [facility] SERVICES [address] [place], [place], [address] Phone: [phone]   Fax:  [phone]  Physical Therapy Treatment  Patient Details  Name: Ian Gill MRN: [id number] Date of Birth: November 16, 1945 Referring Provider: Cristopher Peru K  Encounter Date: 06/12/2016      PT End of Session - 06/12/16 1311    Visit Number 5   Number of Visits 17   Date for PT Re-Evaluation 06/30/16   Authorization Type 5/10   PT Start Time 0104   PT Stop Time 0200   PT Time Calculation (min) 56 min   Equipment Utilized During Treatment Gait belt   Activity Tolerance Patient tolerated treatment well;Patient limited by fatigue   Behavior During Therapy [facility] for tasks assessed/performed      Past Medical History:  Diagnosis Date  . Anxiety   . COPD (chronic obstructive pulmonary disease) (HCC)   . Depression   . Parkinson's disease (HCC)   . Seizures (HCC)   . Stroke [facility])     Past Surgical History:  Procedure Laterality Date  . Shoulder surgery    . SPINAL FUSION    . TOTAL KNEE ARTHROPLASTY      There were no vitals filed for this visit.      Subjective Assessment - 06/12/16 1310    Subjective Patient has a history of falls and his balance is unsteady. He is ready to begin his LSVT BIG exercises today. He did  his HEP last night. He feels that he is able to get up  off the couch better.   Pertinent History Patient is having falls that started 2 years ago. Patient moved into [place] apartments in August 2018. He began using the rollator 1 year ago. He has had falls and stumbles and walks too fast and runs into things. He has episods of stumbling 3- 4 times a week.    Limitations Standing;Walking   How long can you stand comfortably? 10 minutes   How long can you walk comfortably? 10 minutes   Patient Stated Goals to walk without getting fatigued .    Currently in Pain? No/denies   Multiple Pain Sites No       Treatment   Patient seen for Adapted LSVT Daily Session Maximal Daily Exercises for facilitation/coordination of movement Sustained movements are designed to rescale the amplitude of movement output for generalization to daily functional activities .Performed as follows for 1 set of 10 repetitions each multidirectional sustained movements  1) Floor to ceiling , needs cues to count to 10 2) Side to side multidirectional Repetitive movements performed in standing and are designed to provide retraining effort needed for sustained muscle activation in tasks , needs cues to rotate forearm palm up 3) Step and reach forward , needs cues to reach UE out to the side and not forward 4) Step and reach backwards , needs cues to step backwards and not forward with LE and to start and finish with UE in correct position 5) Step and reach sideways, needs cues to turn his head 6) Rock and reach forward/backward , needs cues for start position and to not move his LE's in this exercise 7) Rock and reach sideways, needs cues to not move his feet and to rotate backwards Functional tasks: 1. Sit to stand functional component task with supervision 5 reps, cues to stand up straight and begin with hands up and not to use LE's to stabilize on the back of the chair  Simulated activities for:  2. Coordination activities for BUE hands to perform more accurate fine motor tasks such as brushing his teeth and phone use including turning small pegs in peg board 3. Putting on  shirt with cues to push his left UE with increased amphlitude, and to begin with LUE into sleeve and "whip shirt around back" and no assist from PT 5 out of 5 times Patient is having an easier time finding the RUE arm sleeve.  4. Performedbuttons with shirt x 5 with finger flicks x 3 5. TAPPING  activity for raising his legs up high for improved dynamic standing balance and getting legs up on his step stool for getting into bed, practicing on first  step and second step of stairs x 10 x 2 repetitions     Cuing is needed to stretch the leg out in seated side reaching, turning his head with side stepping, swinging his arm in rock and reach, keeping his feet even for stepping backwards, and cuing for correct body position to be safe while standing and holding onto plinth.  Fatigue with sit to stand but demonstrating more control .  Patient needs 2 - 3 repetitions before he is able to perform the exercises with correct technique. Patient needs max cueing needed to appropriately perform LSVT tasks with leg, hand, and head position. Patient needs safety cues for safe sitting and standing transfers during treatment due to frequent seated rest periods due to his need to rest his back . Patient has great difficulty today getting his shirt off each time before he attempts to put it back on.                         PT Education - 06/12/16 1311    Education provided Yes   Education Details LSVT BIG   Person(s) Educated Patient   Methods Explanation   Comprehension Verbalized understanding             PT Long Term Goals - 06/02/16 1411      PT LONG TERM GOAL #1   Title Patient will be independent in home exercise program to improve strength/mobility for better functional independence with ADLs.   Time 4   Period Weeks   Status New     PT LONG TERM GOAL #2   Title Patient (> 72 years old) will complete five times sit to stand test in < 15 seconds indicating an increased LE strength and improved balance.   Baseline 26.80 sec   Time 4   Period Weeks   Status New     PT LONG TERM GOAL #3   Title Patient will increase Berg Balance score by > 6 points to demonstrate decreased fall risk during functional activities.   Baseline 38/56   Time 4   Period Weeks   Status New     PT LONG TERM GOAL #4   Title Patient will increase six minute walk test distance to >1000 for progression to community ambulator and improve gait  ability   Baseline 500 feet   Time 4   Period Weeks   Status New     PT LONG TERM GOAL #5   Title Patient will increase 10 meter walk test to >1.39m/s as to improve gait speed for better community ambulation and to reduce fall risk   Baseline 1.01 m/sec   Time 4   Period Weeks   Status New     Additional Long Term  Goals   Additional Long Term Goals Yes     PT LONG TERM GOAL #7   Title Patient will improve coordination B hands to improve ability to use phone and computer indicated by a decrease of 9 hole peg test of 5 seconds.   Baseline left 35.86, 37. 94 right sec   Time 4   Period Weeks   Status New               Plan - 06/12/16 1312    Clinical Impression Statement Patient ambulated into the clinic with spc. He used a plinth to support himself today with adapted LSVT BIG exercises, instead of the rollator for support. Patient is able to get his shirt on correctly and a little bit faster with new method and using large amplitude motions and putting in the LUE first 5 out of 5 times. Last visit was 1 out of 5 times.  Patient is able to perform his exercises with modeling, verbal correction and cueing to step in the correct position and to remember start and finish positions. .Patient will benefit from continued skilled PT to improve balance, and mobility.    Rehab Potential Good   Clinical Impairments Affecting Rehab Potential This patient presents with 2, personal factors/ comorbidities including falls, and current situation., and 4  body elements including body structures and functions, activity limitations and or participation restrictions:  unsteady gait, unsteady transfers, decreased standing balance, decreased gait.  Patient's condition is evolving.   PT Frequency 4x / week   PT Duration 4 weeks   PT Treatment/Interventions Gait training;Stair training;Neuromuscular re-education;Balance training;Therapeutic exercise;Therapeutic activities;Patient/family education   PT  Next Visit Plan LSVT BIG   PT Home Exercise Plan LSVT BIG   Consulted and Agree with Plan of Care Patient      Patient will benefit from skilled therapeutic intervention in order to improve the following deficits and impairments:  Abnormal gait, Decreased balance, Decreased endurance, Decreased mobility, Difficulty walking, Cardiopulmonary status limiting activity, Decreased activity tolerance, Decreased safety awareness, Decreased strength, Postural dysfunction, Decreased coordination, Obesity  Visit Diagnosis: Difficulty in walking, not elsewhere classified  Muscle weakness (generalized)     Problem List There are no active problems to display for this patient.  Ezekiel InaKristine S Henretter Piekarski, PT, DPT [place], Barkley BrunsKristine S 06/12/2016, 1:15 PM  [facility] [address] [place], [place], [address] Phone: [phone]   Fax:  [phone]  Name: Ian Gill MRN: [id number] Date of Birth: 1945/08/10

## 2016-06-16 ENCOUNTER — Encounter: Payer: No Typology Code available for payment source | Admitting: Speech Pathology

## 2016-06-16 ENCOUNTER — Ambulatory Visit: Payer: No Typology Code available for payment source | Admitting: Physical Therapy

## 2016-06-16 ENCOUNTER — Encounter: Payer: No Typology Code available for payment source | Admitting: Occupational Therapy

## 2016-06-17 ENCOUNTER — Ambulatory Visit: Payer: Medicare Other | Admitting: Physical Therapy

## 2016-06-17 ENCOUNTER — Encounter: Payer: No Typology Code available for payment source | Admitting: Speech Pathology

## 2016-06-17 ENCOUNTER — Encounter: Payer: Self-pay | Admitting: Physical Therapy

## 2016-06-17 ENCOUNTER — Encounter: Payer: No Typology Code available for payment source | Admitting: Occupational Therapy

## 2016-06-17 DIAGNOSIS — G20 Parkinson's disease: Secondary | ICD-10-CM | POA: Diagnosis not present

## 2016-06-17 DIAGNOSIS — R262 Difficulty in walking, not elsewhere classified: Secondary | ICD-10-CM

## 2016-06-17 DIAGNOSIS — M6281 Muscle weakness (generalized): Secondary | ICD-10-CM

## 2016-06-17 NOTE — Therapy (Signed)
[facility] MAIN [facility] SERVICES [address] [place], [place], [address] Phone: [phone]   Fax:  [phone]  Physical Therapy Treatment  Patient Details  Name: Ian Gill MRN: [id number] Date of Birth: September 18, 1945 Referring Provider: Lonell Face  Encounter Date: 06/17/2016      PT End of Session - 06/17/16 1312    Visit Number 6   Number of Visits 17   Date for PT Re-Evaluation 06/30/16   Authorization Type 6/10   PT Start Time 0105   PT Stop Time 0200   PT Time Calculation (min) 55 min   Equipment Utilized During Treatment Gait belt   Activity Tolerance Patient tolerated treatment well;Patient limited by fatigue   Behavior During Therapy [facility] for tasks assessed/performed      Past Medical History:  Diagnosis Date  . Anxiety   . COPD (chronic obstructive pulmonary disease) (HCC)   . Depression   . Parkinson's disease (HCC)   . Seizures (HCC)   . Stroke [facility])     Past Surgical History:  Procedure Laterality Date  . Shoulder surgery    . SPINAL FUSION    . TOTAL KNEE ARTHROPLASTY      There were no vitals filed for this visit.      Subjective Assessment - 06/17/16 1311    Subjective Patient has a history of falls and his balance is unsteady.  He did  his HEP over the weekend.     Pertinent History Patient is having falls that started 2 years ago. Patient moved into [place] apartments in August 2018. He began using the rollator 1 year ago. He has had falls and stumbles and walks too fast and runs into things. He has episods of stumbling 3- 4 times a week.    Limitations Standing;Walking   How long can you stand comfortably? 10 minutes   How long can you walk comfortably? 10 minutes   Patient Stated Goals to walk without getting fatigued .    Currently in Pain? No/denies   Multiple Pain Sites No      Treatment   Patient seen for Adapted LSVT Daily Session Maximal Daily Exercises for  facilitation/coordination of movement Sustained movements are designed to rescale the amplitude of movement output for generalization to daily functional activities .Performed as follows for 1 set of 10 repetitions each multidirectional sustained movements  1) Floor to ceiling , needs cues to count to 10 2) Side to side multidirectional Repetitive movements performed in standing and are designed to provide retraining effort needed for sustained muscle activation in tasks , needs cues to rotate forearm palm up 3) Step and reach forward , needs cues to reach UE out to the side and not forward 4) Step and reach backwards , needs cues to step backwards and not forward with LE and to start and finish with UE in correct position 5) Step and reach sideways, needs cues to turn his head 6) Rock and reach forward/backward , needs cues for start position and to not move his LE's in this exercise 7) Rock and reach sideways, needs cues to not move his feet and to rotate backwards Functional tasks: 1. Sit to stand functional component task with supervision 5 reps, cues to stand up straight and begin with hands up and not to use LE's to stabilize on the back of the chair Simulated activities for:  2. Coordination activities for BUE hands to perform more accurate fine motor tasks such as brushing  his teeth and phone use including turning small pegs in peg board 3. Putting on  shirt with cues to push his left UE with increased amphlitude, and to begin with LUE into sleeve and "whip shirt around back" and no assist from PT 5 out of 5 times Patient is having an easier time finding the RUE arm sleeve. He is able to perform task independently and also in seated position. The shirt gets stuck to him due to sweating and he has found a way to get his shirt off easier, over his head.  4. Performedbuttons with shirt x 5 with finger flicks x 3, much quicker and with much less difficulty.  5. TAPPING activity for raising his  legs up high for improved dynamic standing balance and getting legs up on his step stool for getting into bed, practicing on first step and second step of stairs x 10 x 2 repetitions  Gait training with BIG arm swing x 200 feet and cues for big arm swing with cues to not go so fast Patient is more comfortable with sitting during functional task using a chair with a back to support his low back. He is able to perform buttons and putting on and off his shirt without help, using finger flicks . He is needing less modelying during standing and seated exercises but does need initial modelying for first repetition and then he is able to correctly perform the exercise with minimal verbal cuing.                            PT Education - 06/17/16 1312    Education provided Yes   Education Details start and end positions for LSVT BIG   Person(s) Educated Patient   Methods Explanation   Comprehension Verbalized understanding             PT Long Term Goals - 06/02/16 1411      PT LONG TERM GOAL #1   Title Patient will be independent in home exercise program to improve strength/mobility for better functional independence with ADLs.   Time 4   Period Weeks   Status New     PT LONG TERM GOAL #2   Title Patient (> 72 years old) will complete five times sit to stand test in < 15 seconds indicating an increased LE strength and improved balance.   Baseline 26.80 sec   Time 4   Period Weeks   Status New     PT LONG TERM GOAL #3   Title Patient will increase Berg Balance score by > 6 points to demonstrate decreased fall risk during functional activities.   Baseline 38/56   Time 4   Period Weeks   Status New     PT LONG TERM GOAL #4   Title Patient will increase six minute walk test distance to >1000 for progression to community ambulator and improve gait ability   Baseline 500 feet   Time 4   Period Weeks   Status New     PT LONG TERM GOAL #5   Title Patient will  increase 10 meter walk test to >1.53m/s as to improve gait speed for better community ambulation and to reduce fall risk   Baseline 1.01 m/sec   Time 4   Period Weeks   Status New     Additional Long Term Goals   Additional Long Term Goals Yes     PT LONG TERM GOAL #7  Title Patient will improve coordination B hands to improve ability to use phone and computer indicated by a decrease of 9 hole peg test of 5 seconds.   Baseline left 35.86, 37. 94 right sec   Time 4   Period Weeks   Status New               Plan - 06/17/16 1314    Clinical Impression Statement Patient needs cues for correct positions of standing exercises; palm up for side stepping, head turns with side stepping, toe up during backward stepping, and correct start and finish positions. His performance improves with practice.  He continues to need assist with which exercises are held for a count of 10. Patient is able to get his shirt on correctly and faster with new method and using large amplitude motions. Patient is able to perform his exercises with modeling and verbal correction and cueing to step in the correct position. .Patient will benefit from continued skilled PT to improve balance, and mobility.    Rehab Potential Good   Clinical Impairments Affecting Rehab Potential This patient presents with 2, personal factors/ comorbidities including falls, and current situation., and 4  body elements including body structures and functions, activity limitations and or participation restrictions:  unsteady gait, unsteady transfers, decreased standing balance, decreased gait.  Patient's condition is evolving.   PT Frequency 4x / week   PT Duration 4 weeks   PT Treatment/Interventions Gait training;Stair training;Neuromuscular re-education;Balance training;Therapeutic exercise;Therapeutic activities;Patient/family education   PT Next Visit Plan LSVT BIG   PT Home Exercise Plan LSVT BIG   Consulted and Agree with Plan of  Care Patient      Patient will benefit from skilled therapeutic intervention in order to improve the following deficits and impairments:  Abnormal gait, Decreased balance, Decreased endurance, Decreased mobility, Difficulty walking, Cardiopulmonary status limiting activity, Decreased activity tolerance, Decreased safety awareness, Decreased strength, Postural dysfunction, Decreased coordination, Obesity  Visit Diagnosis: Difficulty in walking, not elsewhere classified  Muscle weakness (generalized)     Problem List There are no active problems to display for this patient. Ezekiel InaKristine S Audyn Dimercurio, PT, DPT  [place], [place] S 06/17/2016, 1:48 PM  [place] [facility] [address] [place], [place], [address] Phone: [phone]   Fax:  [phone]  Name: Ian Gill MRN: [id number] Date of Birth: [birth year]

## 2016-06-18 ENCOUNTER — Encounter: Payer: No Typology Code available for payment source | Admitting: Occupational Therapy

## 2016-06-18 ENCOUNTER — Encounter: Payer: Self-pay | Admitting: Physical Therapy

## 2016-06-18 ENCOUNTER — Ambulatory Visit: Payer: Medicare Other | Admitting: Physical Therapy

## 2016-06-18 ENCOUNTER — Encounter: Payer: No Typology Code available for payment source | Admitting: Speech Pathology

## 2016-06-18 DIAGNOSIS — G20 Parkinson's disease: Secondary | ICD-10-CM | POA: Diagnosis not present

## 2016-06-18 DIAGNOSIS — M6281 Muscle weakness (generalized): Secondary | ICD-10-CM

## 2016-06-18 DIAGNOSIS — R262 Difficulty in walking, not elsewhere classified: Secondary | ICD-10-CM

## 2016-06-18 NOTE — Therapy (Signed)
[facility] MAIN [facility] [address] [place], [place], [address] Phone: [phone]   Fax:  [phone]  Physical Therapy Treatment  Patient Details  Name: Ian Gill MRN: [id number] Date of Birth: Oct 17, 1945 Referring Provider: Cristopher PeruSHAH, HEMANG K  Encounter Date: 06/18/2016      PT End of Session - 06/18/16 1245    Visit Number 7   Number of Visits 17   Date for PT Re-Evaluation 06/30/16   Authorization Type 7/10   PT Start Time 1248   PT Stop Time 1405   PT Time Calculation (min) 77 min   Equipment Utilized During Treatment Gait belt   Activity Tolerance Patient tolerated treatment well;Patient limited by fatigue   Behavior [facility] Therapy [facility] for tasks assessed/performed      Past Medical History:  Diagnosis Date  . Anxiety   . COPD (chronic obstructive pulmonary disease) (HCC)   . Depression   . Parkinson's disease (HCC)   . Seizures (HCC)   . Stroke [facility](HCC)     Past Surgical History:  Procedure Laterality Date  . Shoulder surgery    . SPINAL FUSION    . TOTAL KNEE ARTHROPLASTY      There were no vitals filed for this visit.      Subjective Assessment - 06/18/16 1244    Subjective Patient states his back hurts if he walks too much but states he has not had an increase in back pain since starting this program. Patient states he has not been using his walker this week. (Patient arrives to clinic ambulating without AD.)   Pertinent History Patient is having falls that started 2 years ago. Patient moved into [place] apartments in August 2018. He began using the rollator 1 year ago. He has had falls and stumbles and walks too fast and runs into things. He has episods of stumbling 3- 4 times a week.    Limitations Standing;Walking   How long can you stand comfortably? 10 minutes   How long can you walk comfortably? 10 minutes   Patient Stated Goals to walk without getting fatigued .    Currently in Pain? No/denies     Patient arrives to clinic this date ambulating without AD.    LSVT: Patient seen for adapted LSVT Daily Session Maximal Daily Exercises for facilitation/coordination of movement Maximum Sustained Movements are designed to rescale the amplitude of movement output for generalization to daily functional activities. Performed as follows for 1 set of 10 repetitions each multidirectional sustained movements: 1) Floor to ceiling - patient needed occasional modeling cue to reach forward 2) Side to side multidirectional movement performed in sitting- modeling and tactile for left forearm supination able to improve somewhat with cuing 3) Step and reach forward- patient did well with this exercise 4) Step and reach backwards- patient had difficulty with this activity as he was very limited in the amount of forward trunk flexion and with bringing his arm back into extension.  5) Step and reach sideways- modeling for supination of forearm and for initial sequencing but then patient was able to perform without further cuing 6) Rock and reach forward/backward- patient needed modeling with this activity.  7) Rock and reach sideways- patient needed help with initial set-up for this exercise as he was trying to hold onto chair in front of himself instead of behind him. Patient needed modeling and cuing for head turn and for supination of forearm especially on the left.   Note: Patient needed several  short, sitting rest breaks during above exercises secondary to fatigue. Patient did well with counting out loud for exercise reps. His voice was clear and audible.   Functional Component Task: 1.Sit to stand functional component task with supervision 10 reps; modeling and cuing for hand placement/movement but patient had difficulty with proper arm/hand movements Simulated activities for:  2. Coordination activities for BUE hands to perform more accurate fine motor tasks such as brushing his teeth and phone use  including turning small pegs in peg board; patient did fairly well with this task. Patient did drop pegs multiple times but was able to self-correct. 3. Putting on shirt with cues to push his left UE with increased amplitude, and to begin with LUE into sleeve and "whip shirt around back" and assist from PT 2 out of 5 times Patient struggled the most with being able to bring the shirt across his back and then with placing his right arm initially into the sleeve. Once his fingers were at the top opening of the sleeve he did well with pushing his arm through.  He is able to perform task with supervision to CGA in unsupported sitting on the mat table. Patient pulled shirt over his head to doff shirt and this method consistently worked well for the patient and he was able to perform doffing independently but still needs some assist with donning. Attempted alternate method of putting left arm in sleeve and then he would hold the shirt material by the opposite shoulder, tuck his head and pull the shirt over the back of his head and that way he was able to get the shirt farther across his back and neck so that he had an easier time grabbing the shirt on the opposite side.  4. Performedbuttons with shirt x 5 with  finger flicks once. Patient performed this task in arm chair with back support.  5. TAPPING activity for raising his legs up high for improved dynamic standing balance and getting legs up on his step stool for getting into bed, practicing on first step and second step of stairs x 10 x 2 repetitions without UEs support with one small loss of balance that he was able to self-correct.   Gait Training: Functional mobility without assistive device with BIG arm swing x 340 feet and cues for big arm swing with cues to maintain erect posture and to not lean forward and accelerate when he gets fatigued. Patient required one seated rest break secondary to fatigue.   Daily Homework Activity: 5 reps of don/doff a  jacket instead of a shirt trying to flip opposite shoulder of shirt over back of his flexed head instead of trying to swing his body to the opposite side and fling the shirt over the back.          PT Education - 06/18/16 1245    Education provided Yes   Education Details Discussed LSVT BIG homework activity, reviewed BIG exercises   Person(s) Educated Patient   Methods Explanation;Demonstration;Verbal cues   Comprehension Verbalized understanding;Returned demonstration             PT Long Term Goals - 06/02/16 1411      PT LONG TERM GOAL #1   Title Patient will be independent in home exercise program to improve strength/mobility for better functional independence with ADLs.   Time 4   Period Weeks   Status New     PT LONG TERM GOAL #2   Title Patient (> 72 years old) will  complete five times sit to stand test in < 15 seconds indicating an increased LE strength and improved balance.   Baseline 26.80 sec   Time 4   Period Weeks   Status New     PT LONG TERM GOAL #3   Title Patient will increase Berg Balance score by > 6 points to demonstrate decreased fall risk during functional activities.   Baseline 38/56   Time 4   Period Weeks   Status New     PT LONG TERM GOAL #4   Title Patient will increase six minute walk test distance to >1000 for progression to community ambulator and improve gait ability   Baseline 500 feet   Time 4   Period Weeks   Status New     PT LONG TERM GOAL #5   Title Patient will increase 10 meter walk test to >1.67m/s as to improve gait speed for better community ambulation and to reduce fall risk   Baseline 1.01 m/sec   Time 4   Period Weeks   Status New     Additional Long Term Goals   Additional Long Term Goals Yes     PT LONG TERM GOAL #7   Title Patient will improve coordination B hands to improve ability to use phone and computer indicated by a decrease of 9 hole peg test of 5 seconds.   Baseline left 35.86, 37. 94 right sec    Time 4   Period Weeks   Status New               Plan - 06/18/16 1245    Clinical Impression Statement Patient continues to require modeling and limited verbal cuing to perform LSVT BIG exercises. Patient has difficulty with right forearm supination during majority of the exercises and with forward trunk bending during backwards step exercise. Patient did well with buttoning activity but struggled with donning shirt. Patient does well with doffing shirt. Patient tried a variation to help with donning shirt and this method seemed to work well for patient and this was given for his daily homework task this date. Patient would benefit from contined participation in the LSVT BIG program to further address goals and functional deficits.    Rehab Potential Good   Clinical Impairments Affecting Rehab Potential This patient presents with 2, personal factors/ comorbidities including falls, and current situation., and 4  body elements including body structures and functions, activity limitations and or participation restrictions:  unsteady gait, unsteady transfers, decreased standing balance, decreased gait.  Patient's condition is evolving.   PT Frequency 4x / week   PT Duration 4 weeks   PT Treatment/Interventions Gait training;Stair training;Neuromuscular re-education;Balance training;Therapeutic exercise;Therapeutic activities;Patient/family education   PT Next Visit Plan LSVT BIG   PT Home Exercise Plan LSVT BIG   Consulted and Agree with Plan of Care Patient      Patient will benefit from skilled therapeutic intervention in order to improve the following deficits and impairments:  Abnormal gait, Decreased balance, Decreased endurance, Decreased mobility, Difficulty walking, Cardiopulmonary status limiting activity, Decreased activity tolerance, Decreased safety awareness, Decreased strength, Postural dysfunction, Decreased coordination, Obesity  Visit Diagnosis: Difficulty in walking, not  elsewhere classified  Muscle weakness (generalized)     Problem List There are no active problems to display for this patient.   Murphy,Dorriea 06/18/2016, 3:17 PM  [place] [facility] MAIN [facility] SERVICES [address] [place], [place], [address] Phone: [phone]   Fax:  [phone]  Name: Ian Gill MRN: [id number] Date of Birth: 1945/02/04

## 2016-06-19 ENCOUNTER — Encounter: Payer: No Typology Code available for payment source | Admitting: Occupational Therapy

## 2016-06-19 ENCOUNTER — Ambulatory Visit: Payer: No Typology Code available for payment source | Admitting: Physical Therapy

## 2016-06-19 ENCOUNTER — Encounter: Payer: No Typology Code available for payment source | Admitting: Speech Pathology

## 2016-06-24 ENCOUNTER — Encounter: Payer: Self-pay | Admitting: Physical Therapy

## 2016-06-24 ENCOUNTER — Encounter: Payer: No Typology Code available for payment source | Admitting: Speech Pathology

## 2016-06-24 ENCOUNTER — Ambulatory Visit: Payer: Medicare Other | Admitting: Physical Therapy

## 2016-06-24 DIAGNOSIS — M6281 Muscle weakness (generalized): Secondary | ICD-10-CM

## 2016-06-24 DIAGNOSIS — G20 Parkinson's disease: Secondary | ICD-10-CM | POA: Diagnosis not present

## 2016-06-24 DIAGNOSIS — R262 Difficulty in walking, not elsewhere classified: Secondary | ICD-10-CM

## 2016-06-24 NOTE — Therapy (Signed)
[facility] MAIN [facility] SERVICES [address] [place], [place], [address] Phone: [phone]   Fax:  [phone]  Physical Therapy Treatment  Patient Details  Name: Ian Gill MRN: [id number] Date of Birth: 1945-10-24 Referring Provider: Cristopher Peru K  Encounter Date: 06/24/2016      PT End of Session - 06/24/16 1309    Visit Number 8   Number of Visits 17   Date for PT Re-Evaluation 06/30/16   Authorization Type 8/10   PT Start Time 0101   PT Stop Time 0200   PT Time Calculation (min) 59 min   Equipment Utilized During Treatment Gait belt   Activity Tolerance Patient tolerated treatment well;Patient limited by fatigue   Behavior During Therapy [facility] for tasks assessed/performed      Past Medical History:  Diagnosis Date  . Anxiety   . COPD (chronic obstructive pulmonary disease) (HCC)   . Depression   . Parkinson's disease (HCC)   . Seizures (HCC)   . Stroke [facility])     Past Surgical History:  Procedure Laterality Date  . Shoulder surgery    . SPINAL FUSION    . TOTAL KNEE ARTHROPLASTY      There were no vitals filed for this visit.      Subjective Assessment - 06/24/16 1308    Subjective Patient states his back hurts if he walks too much but states he has not had an increase in back pain since starting this program. Patient states he has not been using his walker this week. (Patient arrives to clinic ambulating without AD.)   Pertinent History Patient is having falls that started 2 years ago. Patient moved into [place] apartments in August 2018. He began using the rollator 1 year ago. He has had falls and stumbles and walks too fast and runs into things. He has episods of stumbling 3- 4 times a week.    Limitations Standing;Walking   How long can you stand comfortably? 10 minutes   How long can you walk comfortably? 10 minutes   Patient Stated Goals to walk without getting fatigued .    Currently in Pain? No/denies    Multiple Pain Sites No        Treatment   Patient seen for Adapted LSVT Daily Session Maximal Daily Exercises for facilitation/coordination of movement Sustained movements are designed to rescale the amplitude of movement output for generalization to daily functional activities .Performed as follows for 1 set of 10 repetitions each multidirectional sustained movements  1) Floor to ceiling , needs cues to count to 10 and to hold for 10 counts 2) Side to side multidirectional Repetitive movements performed in standing and are designed to provide retraining effort needed for sustained muscle activation in tasks , needs cues to rotate forearm palm up 3) Step and reach forward , needs cues to reach UE out to the side and not forward 4) Step and reach backwards , needs cues to step backwards and not forward with LE and to start and finish with UE in correct position 5) Step and reach sideways, needs cues to turn his head and not to rotate his body but only to step sideways, and cues to keep palm up 6) Rock and reach forward/backward , needs cues for start position and to not move his LE's in this exercise and only to swing his ue's after he has shifted his weight. 7) Rock and reach sideways, needs cues to not move his feet and to  rotate backwards Functional tasks: 1. Sit to stand functional component task with supervision 5 reps, cues to stand up straight and begin with hands up and not to use LE's to stabilize on the back of the chair; and cues to stand all the way up straight. Simulated activities for:  2. Coordination activities for BUE hands to perform more accurate fine motor tasks such as brushing his teeth and phone use including turning small pegs in peg board 3. Putting on  shirt with cues to push his left UE with increased amphlitude, and to begin with LUE into sleeve and "whip shirt around back" and no assist from PT 5 out of 5 times Patient is having an easier time finding the RUE arm  sleeve. He is having difficulty with his shirt sticking to the other shirt 4. Performedbuttons with shirt x 5 with finger flicks x 3 5. TAPPING activity for raising his legs up high for improved dynamic standing balance and getting legs up on his step stool for getting into bed, practicing on first step and second step of stairs x 10 x 2 repetitions ; cues not to go to fast and to regain his balance after each set of tapping.    Cuing is needed to stretch the leg out in seated side reaching, turning his head with side stepping, swinging his arm in rock and reach, keeping his feet even for stepping backwards, and cuing for correct body position to be safe while standing and holding onto plinth. Patient has discontinues steps with stepping backwards and with side stepping. Fatigue with sit to stand but demonstrating more control. Patient needs seated rest periods due to back tiredness and back pain that decreases with sitting and time.  Patient needs 2 - 3 repetitions before he is able to perform the exercises with correct technique. Patient needs max cueing needed to appropriately perform LSVT tasks with leg, hand, and head position. Patient needs safety cues for safe sitting and standing transfers during treatment due to frequent seated rest periods due to his need to rest his back . Patient has great difficulty today getting his shirt off each time before he attempts to put it back on.                          PT Education - 06/24/16 1309    Education provided Yes   Education Details discussed frequency of LSVT and intensity   Person(s) Educated Patient   Methods Explanation   Comprehension Verbalized understanding;Returned demonstration;Verbal cues required             PT Long Term Goals - 06/02/16 1411      PT LONG TERM GOAL #1   Title Patient will be independent in home exercise program to improve strength/mobility for better functional independence with ADLs.    Time 4   Period Weeks   Status New     PT LONG TERM GOAL #2   Title Patient (> 72 years old) will complete five times sit to stand test in < 15 seconds indicating an increased LE strength and improved balance.   Baseline 26.80 sec   Time 4   Period Weeks   Status New     PT LONG TERM GOAL #3   Title Patient will increase Berg Balance score by > 6 points to demonstrate decreased fall risk during functional activities.   Baseline 38/56   Time 4   Period Weeks   Status  New     PT LONG TERM GOAL #4   Title Patient will increase six minute walk test distance to >1000 for progression to community ambulator and improve gait ability   Baseline 500 feet   Time 4   Period Weeks   Status New     PT LONG TERM GOAL #5   Title Patient will increase 10 meter walk test to >1.66m/s as to improve gait speed for better community ambulation and to reduce fall risk   Baseline 1.01 m/sec   Time 4   Period Weeks   Status New     Additional Long Term Goals   Additional Long Term Goals Yes     PT LONG TERM GOAL #7   Title Patient will improve coordination B hands to improve ability to use phone and computer indicated by a decrease of 9 hole peg test of 5 seconds.   Baseline left 35.86, 37. 94 right sec   Time 4   Period Weeks   Status New               Plan - 06/24/16 1310    Clinical Impression Statement Patient is able to button his shirt correctly and faster with new method of finger flick motions. Patient is able to perform his exercises with modeling and verbal correction and cueing to step in the correct position. Patient need cueing for BIG arm swing, Patient needs cueing for BIG swing. , Patient needs cueing for BIG swing and adding dual cognitive loads and distracters.Patient will benefit from continued skilled PT to improve balance, and mobility.ding dual cognitive loads and distracters.Patient will benefit from continued skilled PT to improve balance, and mobility.   Rehab  Potential Good   Clinical Impairments Affecting Rehab Potential This patient presents with 2, personal factors/ comorbidities including falls, and current situation., and 4  body elements including body structures and functions, activity limitations and or participation restrictions:  unsteady gait, unsteady transfers, decreased standing balance, decreased gait.  Patient's condition is evolving.   PT Frequency 4x / week   PT Duration 4 weeks   PT Treatment/Interventions Gait training;Stair training;Neuromuscular re-education;Balance training;Therapeutic exercise;Therapeutic activities;Patient/family education   PT Next Visit Plan LSVT BIG   PT Home Exercise Plan LSVT BIG   Consulted and Agree with Plan of Care Patient      Patient will benefit from skilled therapeutic intervention in order to improve the following deficits and impairments:  Abnormal gait, Decreased balance, Decreased endurance, Decreased mobility, Difficulty walking, Cardiopulmonary status limiting activity, Decreased activity tolerance, Decreased safety awareness, Decreased strength, Postural dysfunction, Decreased coordination, Obesity  Visit Diagnosis: Difficulty in walking, not elsewhere classified  Muscle weakness (generalized)     Problem List There are no active problems to display for this patient. Ezekiel Ina, PT, DPT  [place], [place] 06/24/2016, 1:13 PM  [place] [facility] MAIN [facility] SERVICES [address] [place], [place], [address] Phone: [phone]   Fax:  [phone]  Name: Ian Gill MRN: [id number] Date of Birth: Jul 22, 1945

## 2016-06-25 ENCOUNTER — Encounter: Payer: No Typology Code available for payment source | Admitting: Speech Pathology

## 2016-06-25 ENCOUNTER — Encounter: Payer: Self-pay | Admitting: Physical Therapy

## 2016-06-25 ENCOUNTER — Ambulatory Visit: Payer: Medicare Other | Admitting: Physical Therapy

## 2016-06-25 DIAGNOSIS — R262 Difficulty in walking, not elsewhere classified: Secondary | ICD-10-CM

## 2016-06-25 DIAGNOSIS — G20 Parkinson's disease: Secondary | ICD-10-CM | POA: Diagnosis not present

## 2016-06-25 DIAGNOSIS — M6281 Muscle weakness (generalized): Secondary | ICD-10-CM

## 2016-06-25 NOTE — Therapy (Signed)
[facility] MAIN [facility] [address] [place], [place], [address] Phone: [phone]   Fax:  [phone]  Physical Therapy Treatment  Patient Details  Name: Ian Gill MRN: [id number] Date of Birth: February 01, 1945 Referring Provider: Lonell FaceSHAH, HEMANG K  Encounter Date: 06/25/2016      PT End of Session - 06/25/16 1311    Visit Number 9   Number of Visits 17   Date for PT Re-Evaluation 06/30/16   Authorization Type 9/10   PT Start Time 0102   PT Stop Time 0159   PT Time Calculation (min) 57 min   Equipment Utilized During Treatment Gait belt   Activity Tolerance Patient tolerated treatment well;Patient limited by fatigue   Behavior [facility] for tasks assessed/performed      Past Medical History:  Diagnosis Date  . Anxiety   . COPD (chronic obstructive pulmonary disease) (HCC)   . Depression   . Parkinson's disease (HCC)   . Seizures (HCC)   . Stroke [facility](HCC)     Past Surgical History:  Procedure Laterality Date  . Shoulder surgery    . SPINAL FUSION    . TOTAL KNEE ARTHROPLASTY      There were no vitals filed for this visit.      Subjective Assessment - 06/25/16 1309    Subjective Patient reports that he feels that his walking is getting much better.    Pertinent History Patient is having falls that started 2 years ago. Patient moved into [address] [place] apartments in August 2018. He began using the rollator 1 year ago. He has had falls and stumbles and walks too fast and runs into things. He has episods of stumbling 3- 4 times a week.    Limitations Standing;Walking   How long can you stand comfortably? 10 minutes   How long can you walk comfortably? 10 minutes   Patient Stated Goals to walk without getting fatigued .    Currently in Pain? No/denies   Multiple Pain Sites No      Treatment   Patient seen for  LSVT Daily Session Maximal Daily Exercises due to improving standing balance,  for  facilitation/coordination of movement Sustained movements are designed to rescale the amplitude of movement output for generalization to daily functional activities .Performed as follows for 1 set of 10 repetitions each multidirectional sustained movements. He is able to perform exercises better after several modeling and VC for correct technique and he is not needing the adapted exercises today  1) Floor to ceiling , needs cues to count to 10 and to hold for 10 counts 2) Side to side multidirectional Repetitive movements performed in standing and are designed to provide retraining effort needed for sustained muscle activation in tasks , needs cues to rotate forearm palm up 3) Step and reach forward , needs cues to reach UE out to the side and not forward,  (not adapted ) 4) Step and reach backwards , needs cues to step backwards and not forward with LE and to start and finish with UE in correct position, ( not adapted) 5) Step and reach sideways, needs cues to turn his head and not to rotate his body but only to step sideways, and cues to keep palm up, (not adapted) 6) Rock and reach forward/backward , needs cues for start position and to not move his LE's in this exercise and only to swing his ue's after he has shifted his weight.(not adapted) 7) Rock and reach sideways, needs  cues to not move his feet and to rotate backwards Functional tasks:(not adapted) 1. Sit to stand functional component task with supervision 5 reps, cues to stand up straight and begin with hands up and not to use LE's to stabilize on the back of the chair; and cues to stand all the way up straight. Simulated activities for:  2. Coordination activities for BUE hands to perform more accurate fine motor tasks such as brushing his teeth and phone use including turning small pegs in peg board 3. Putting on shirt with cues to push his left UE with increased amphlitude, and to begin with LUE into sleeve and "whip shirt around back"  and noassist from PT 5 out of 5 times Patient is having an easier time finding the RUE arm sleeve. He is having difficulty with his shirt sticking to the other shirt 4. Performedbuttons with shirt x 5 with finger flicks x 3 5. TAPPING activity for raising his legs up high for improved dynamic standing balance and getting legs up on his step stool for getting into bed, practicing on first step and second step of stairs x 10 x 2 repetitions ; cues not to go to fast and to regain his balance after each set of tapping.    Cuing is needed to stretch the leg out in seated side reaching, turning his head with side stepping, swinging his arm in rock and reach, keeping his feet even for stepping backwards, and cuing for correct body position to be safe while standing and holding onto plinth. Patient has discontinues steps with stepping backwards and with side stepping. Fatigue with sit to stand but demonstrating more control. Patient needs seated rest periods due to back tiredness and back pain that decreases with sitting and time.  Patient needs 2 - 3 repetitions before he is able to perform the exercises with correct technique. Patient needs max cueing needed to appropriately perform LSVT tasks with leg, hand, and head position. Patient needs safety cues for safe sitting and standing transfers during treatment due to frequent seated rest periods due to his need to rest his back . Patient has great difficulty today getting his shirt off each time before he attempts to put it back on.                           PT Education - 06/25/16 1310    Education provided Yes   Education Details not walking too fast   Person(s) Educated Patient   Methods Explanation   Comprehension Verbalized understanding             PT Long Term Goals - 06/02/16 1411      PT LONG TERM GOAL #1   Title Patient will be independent in home exercise program to improve strength/mobility for better  functional independence with ADLs.   Time 4   Period Weeks   Status New     PT LONG TERM GOAL #2   Title Patient (> 28 years old) will complete five times sit to stand test in < 15 seconds indicating an increased LE strength and improved balance.   Baseline 26.80 sec   Time 4   Period Weeks   Status New     PT LONG TERM GOAL #3   Title Patient will increase Berg Balance score by > 6 points to demonstrate decreased fall risk during functional activities.   Baseline 38/56   Time 4   Period Weeks  Status New     PT LONG TERM GOAL #4   Title Patient will increase six minute walk test distance to >1000 for progression to community ambulator and improve gait ability   Baseline 500 feet   Time 4   Period Weeks   Status New     PT LONG TERM GOAL #5   Title Patient will increase 10 meter walk test to >1.13m/s as to improve gait speed for better community ambulation and to reduce fall risk   Baseline 1.01 m/sec   Time 4   Period Weeks   Status New     Additional Long Term Goals   Additional Long Term Goals Yes     PT LONG TERM GOAL #7   Title Patient will improve coordination B hands to improve ability to use phone and computer indicated by a decrease of 9 hole peg test of 5 seconds.   Baseline left 35.86, 37. 94 right sec   Time 4   Period Weeks   Status New               Plan - 06/25/16 1311    Clinical Impression Statement Patient continues to demonstrates less incoordination of movement with select exercises such as rock and reach and stepping backwards. Patient responds well to verbal and tactile cues to correct form and technique. Patient is able to catch mistakes in technique with incorrect positions and is able remember the start and finish positions. Motor control of LE much improved. Muscle fatiguPatient continues to demonstrates less incoordination of movement with select exercises such as rock and reach and stepping backwards. Patient responds well to verbal and  tactile cues to correct form and technique. Patient is able to catch mistakes in technique with incorrect positions and is able remember the start and finish positions. Motor control of LE much improved. Muscle fatigue but no major pain complaints.. Patient will continue to benefit from skilled PT to improve strength, balance and gait.e but no major pain complaints.. Patient will continue to benefit from skilled PT to improve strength, balance and gait.   Rehab Potential Good   Clinical Impairments Affecting Rehab Potential This patient presents with 2, personal factors/ comorbidities including falls, and current situation., and 4  body elements including body structures and functions, activity limitations and or participation restrictions:  unsteady gait, unsteady transfers, decreased standing balance, decreased gait.  Patient's condition is evolving.   PT Frequency 4x / week   PT Duration 4 weeks   PT Treatment/Interventions Gait training;Stair training;Neuromuscular re-education;Balance training;Therapeutic exercise;Therapeutic activities;Patient/family education   PT Next Visit Plan LSVT BIG   PT Home Exercise Plan LSVT BIG   Consulted and Agree with Plan of Care Patient      Patient will benefit from skilled therapeutic intervention in order to improve the following deficits and impairments:  Abnormal gait, Decreased balance, Decreased endurance, Decreased mobility, Difficulty walking, Cardiopulmonary status limiting activity, Decreased activity tolerance, Decreased safety awareness, Decreased strength, Postural dysfunction, Decreased coordination, Obesity  Visit Diagnosis: Difficulty in walking, not elsewhere classified  Muscle weakness (generalized)     Problem List There are no active problems to display for this patient. Ezekiel Ina, PT, DPT  [place], [place] 06/25/2016, 1:57 PM  [facility] MAIN [facility] SERVICES [address]  [place], [place], [address] Phone: [phone]   Fax:  [phone]  Name: Ian Gill MRN: [id number] Date of Birth: December 14, 1945

## 2016-06-26 ENCOUNTER — Ambulatory Visit: Payer: Medicare Other | Admitting: Physical Therapy

## 2016-06-26 ENCOUNTER — Encounter: Payer: No Typology Code available for payment source | Admitting: Speech Pathology

## 2016-06-26 ENCOUNTER — Encounter: Payer: No Typology Code available for payment source | Admitting: Occupational Therapy

## 2016-06-26 ENCOUNTER — Encounter: Payer: Self-pay | Admitting: Physical Therapy

## 2016-06-26 DIAGNOSIS — G20 Parkinson's disease: Secondary | ICD-10-CM | POA: Diagnosis not present

## 2016-06-26 DIAGNOSIS — M6281 Muscle weakness (generalized): Secondary | ICD-10-CM

## 2016-06-26 DIAGNOSIS — R262 Difficulty in walking, not elsewhere classified: Secondary | ICD-10-CM

## 2016-06-26 NOTE — Therapy (Signed)
[place] MAIN [facility] SERVICES [address] [place], [place], [address] Phone: [phone]   Fax:  [phone]  Physical Therapy Treatment/ Progress Note  Patient Details  Name: Ian Gill MRN: [id number] Date of Birth: 1945-08-15 Referring Provider: Vladimir Crofts  Encounter Date: 06/26/2016      PT End of Session - 06/26/16 1306    Visit Number 10   Number of Visits 17   Date for PT Re-Evaluation 06/30/16   Authorization Type 10/10   PT Start Time 0102   PT Stop Time 0200   PT Time Calculation (min) 58 min   Equipment Utilized During Treatment Gait belt   Activity Tolerance Patient tolerated treatment well;Patient limited by fatigue   Behavior During Therapy WFL for tasks assessed/performed      Past Medical History:  Diagnosis Date  . Anxiety   . COPD (chronic obstructive pulmonary disease) ([place])   . Depression   . Parkinson's disease ([place])   . Seizures ([place])   . Stroke [facility])     Past Surgical History:  Procedure Laterality Date  . Shoulder surgery    . SPINAL FUSION    . TOTAL KNEE ARTHROPLASTY      There were no vitals filed for this visit.      Subjective Assessment - 06/26/16 1307    Subjective Patient reports that he feels that his walking is getting much better.    Pertinent History Patient is having falls that started 2 years ago. Patient moved into [facility] apartments in August 2018. He began using the rollator 1 year ago. He has had falls and stumbles and walks too fast and runs into things. He has episods of stumbling 3- 4 times a week.    Limitations Standing;Walking   How long can you stand comfortably? 10 minutes   How long can you walk comfortably? 10 minutes   Patient Stated Goals to walk without getting fatigued .    Currently in Pain? No/denies   Multiple Pain Sites No      Treatment  Outcome measures performed and goals reassessed with all goals improving  Patient seen for  LSVT  Daily Session Maximal Daily Exercises due to improving standing balance,  for facilitation/coordination of movement Sustained movements are designed to rescale the amplitude of movement output for generalization to daily functional activities .Performed as follows for 1 set of 10 repetitions each multidirectional sustained movements. He is able to perform exercises better after several modeling and VC for correct technique and he is not needing the adapted exercises today  1) Floor to ceiling , needs cues to count to 10 and to hold for 10 counts 2) Side to side multidirectional Repetitive movements performed in standing and are designed to provide retraining effort needed for sustained muscle activation in tasks , needs cues to rotate forearm palm up 3) Step and reach forward , needs cues to reach UE out to the side and not forward,  (not adapted ) 4) Step and reach backwards , needs cues to step backwards and not forward with LE and to start and finish with UE in correct position, ( not adapted) 5) Step and reach sideways, needs cues to turn his head and not to rotate his body but only to step sideways, and cues to keep palm up, (not adapted) 6) Rock and reach forward/backward , needs cues for start position and to not move his LE's in this exercise and only to swing his ue's after  he has shifted his weight.(not adapted) 7) Rock and reach sideways, needs cues to not move his feet and to rotate backwards Functional tasks:(not adapted) 1. Sit to stand functional component task with supervision 5 reps, cues to stand up straight and begin with hands up and not to use LE's to stabilize on the back of the chair; and cues to stand all the way up straight. Simulated activities for:  2. Coordination activities for BUE hands to perform more accurate fine motor tasks such as brushing his teeth and phone use including turning small pegs in peg board 3. Putting on shirt with cues to push his left UE with  increased amphlitude, and to begin with LUE into sleeve and "whip shirt around back" and noassist from PT 5 out of 5 times Patient is having an easier time finding the RUE arm sleeve. He is having difficulty with his shirt sticking to the other shirt 4. Performedbuttons with shirt x 5 with finger flicks x 3 5. TAPPING activity for raising his legs up high for improved dynamic standing balance and getting legs up on his step stool for getting into bed, practicing on first step and second step of stairs x 10 x 2 repetitions ; cues not to go to fast and to regain his balance after each set of tapping.    Patient is able to perform the LSVT BIG without using UE support. He made progress towards all goals. He did have one loss of balance with fwd stepping and was able to catch himself on the wall with UE.                            PT Education - 06/26/16 1307    Education provided Yes   Education Details walking with Big arm swing   Person(s) Educated Patient   Methods Explanation   Comprehension Verbalized understanding             PT Long Term Goals - 06/26/16 1308      PT LONG TERM GOAL #1   Title Patient will be independent in home exercise program to improve strength/mobility for better functional independence with ADLs.   Baseline patient is performing LSVT BIG   Time 4   Period Weeks   Status On-going     PT LONG TERM GOAL #2   Title Patient (> 50 years old) will complete five times sit to stand test in < 15 seconds indicating an increased LE strength and improved balance.   Baseline 17.29 sec   Time 4   Period Weeks   Status Partially Met     PT LONG TERM GOAL #3   Title Patient will increase Berg Balance score by > 6 points to demonstrate decreased fall risk during functional activities.   Baseline 42/56 berg   Time 4   Period Weeks   Status Partially Met     PT LONG TERM GOAL #4   Title Patient will increase six minute walk test  distance to >1000 for progression to community ambulator and improve gait ability   Baseline 800 feet   Time 4   Period Weeks   Status Partially Met     PT LONG TERM GOAL #5   Title Patient will increase 10 meter walk test to >1.55m/s as to improve gait speed for better community ambulation and to reduce fall risk   Baseline 1.25 m/ sec   Time 4   Period  Weeks   Status Partially Met     PT LONG TERM GOAL #7   Title Patient will improve coordination B hands to improve ability to use phone and computer indicated by a decrease of 9 hole peg test of 5 seconds.   Baseline left 34.73 sec, right 27.72   Period Weeks   Status Partially Met               Plan - 07-06-2016 1330    Clinical Impression Statement Patient is able to perform outcome measures and is making progress towards goals and has decreased his falls risk. Patient is able to perform LSVT BIG exercises and dynamic standing balance exercises. Patient will benefit from skilled PT to improve falls risk and mobility   Rehab Potential Good   Clinical Impairments Affecting Rehab Potential This patient presents with 2, personal factors/ comorbidities including falls, and current situation., and 4  body elements including body structures and functions, activity limitations and or participation restrictions:  unsteady gait, unsteady transfers, decreased standing balance, decreased gait.  Patient's condition is evolving.   PT Frequency 4x / week   PT Duration 4 weeks   PT Treatment/Interventions Gait training;Stair training;Neuromuscular re-education;Balance training;Therapeutic exercise;Therapeutic activities;Patient/family education   PT Next Visit Plan LSVT BIG   PT Home Exercise Plan LSVT BIG   Consulted and Agree with Plan of Care Patient      Patient will benefit from skilled therapeutic intervention in order to improve the following deficits and impairments:  Abnormal gait, Decreased balance, Decreased endurance, Decreased  mobility, Difficulty walking, Cardiopulmonary status limiting activity, Decreased activity tolerance, Decreased safety awareness, Decreased strength, Postural dysfunction, Decreased coordination, Obesity  Visit Diagnosis: Difficulty in walking, not elsewhere classified  Muscle weakness (generalized)       G-Codes - 2016/07/06 1332    Functional Assessment Tool Used (Outpatient Only) 5 x sit tostand, tug, 6 MW, berg balance   Functional Limitation Mobility: Walking and moving around   Mobility: Walking and Moving Around Current Status (P9509) At least 60 percent but less than 80 percent impaired, limited or restricted   Mobility: Walking and Moving Around Goal Status (T2671) At least 40 percent but less than 60 percent impaired, limited or restricted      Problem List There are no active problems to display for this patient.  Alanson Puls, PT, DPT [place], [place] S 2016/07/06, 1:38 PM  [place] MAIN [facility] SERVICES [address] [place], [place], [address] Phone: [phone]   Fax:  [phone]  Name: SHAAN RHOADS MRN: [id number] Date of Birth: August 14, 1945

## 2016-06-27 ENCOUNTER — Encounter: Payer: No Typology Code available for payment source | Admitting: Occupational Therapy

## 2016-06-27 ENCOUNTER — Encounter: Payer: No Typology Code available for payment source | Admitting: Speech Pathology

## 2016-06-30 ENCOUNTER — Encounter: Payer: Self-pay | Admitting: Physical Therapy

## 2016-06-30 ENCOUNTER — Ambulatory Visit: Payer: Medicare Other | Attending: Neurology | Admitting: Physical Therapy

## 2016-06-30 ENCOUNTER — Encounter: Payer: No Typology Code available for payment source | Admitting: Occupational Therapy

## 2016-06-30 ENCOUNTER — Encounter: Payer: No Typology Code available for payment source | Admitting: Speech Pathology

## 2016-06-30 DIAGNOSIS — G20 Parkinson's disease: Secondary | ICD-10-CM | POA: Insufficient documentation

## 2016-06-30 DIAGNOSIS — M6281 Muscle weakness (generalized): Secondary | ICD-10-CM | POA: Insufficient documentation

## 2016-06-30 DIAGNOSIS — R262 Difficulty in walking, not elsewhere classified: Secondary | ICD-10-CM | POA: Diagnosis not present

## 2016-06-30 NOTE — Therapy (Signed)
[place] MAIN [facility] [place] SERVICES [address] [place], [place], [address] Phone: [phone]   Fax:  [phone]  Physical Therapy Treatment  Patient Details  Name: Ian Gill MRN: [id number] Date of Birth: [birth year] Referring Provider: Vladimir Crofts  Encounter Date: 06/30/2016      PT End of Session - 06/30/16 1306    Visit Number 11   Number of Visits 17   Date for PT Re-Evaluation 06/30/16   Authorization Type 1/10   PT Start Time 0100   PT Stop Time 0200   PT Time Calculation (min) 60 min   Equipment Utilized During Treatment Gait belt   Activity Tolerance Patient tolerated treatment well;Patient limited by fatigue   Behavior During Therapy [facility] for tasks assessed/performed      Past Medical History:  Diagnosis Date  . Anxiety   . COPD (chronic obstructive pulmonary disease) ([place])   . Depression   . Parkinson's disease ([place])   . Seizures ([place])   . Stroke [facility])     Past Surgical History:  Procedure Laterality Date  . Shoulder surgery    . SPINAL FUSION    . TOTAL KNEE ARTHROPLASTY      There were no vitals filed for this visit.      Subjective Assessment - 06/30/16 1304    Subjective Patient states his back hurts if he walks too much but states he has not had an increase in back pain since. Patient states he has not been using his walker this week.   Pertinent History Patient is having falls that started 2 years ago. Patient moved into [address] in August 2018. He began using the rollator 1 year ago. He has had falls and stumbles and walks too fast and runs into things. He has episods of stumbling 3- 4 times a week.    Limitations Standing;Walking   How long can you stand comfortably? 10 minutes   How long can you walk comfortably? 10 minutes   Patient Stated Goals to walk without getting fatigued .    Currently in Pain? Yes   Pain Score 4    Pain Location Back   Pain Orientation Left   Pain  Descriptors / Indicators Aching   Pain Type Chronic pain   Pain Onset More than a month ago   Pain Frequency Constant   Multiple Pain Sites No        LSVT: Patient seen forLSVT Daily Session Maximal Daily Exercises for facilitation/coordination of movement Maximum Sustained Movements are designed to rescale the amplitude of movement output for generalization to daily functional activities. Performed as follows for 1 set of 10 repetitions each multidirectional sustained movements: 1) Floor to ceiling - patient needed occasional modeling cue to reach forward 2) Side to side multidirectional movement performed in sitting- modeling and tactile for left forearm supination and to stretch out LE 3) Step and reach forward- patient needed modeling cue to reach out to the side and not forward 4) Step and reach backwards- patient  was very limited in the amount of forward trunk flexion and with bringing his arm back into extension, and raised his toe 50% of the time 5) Step and reach sideways- modeling for supination of forearm and for initial sequencing but then patient was able to perform without further cuing 6) Rock and reach forward/backward- patient needed modeling with this activity. Patient needs cues to alternate his UE swinging movements 7) Rock and reach sideways- Patient needed modeling and  cuing for head turn and for supination of forearm especially on the left. He needs cues to finish this exercise.   Patient needed several short, sitting rest breaks during above exercises secondary to fatigue. Patient did well with counting out loud for exercise reps. His voice was clear and audible. Patient is trying to perform exercises by memory and not look at his written hand out.   Functional Component Task: 1.Sit to stand functional component task with supervision 10 reps; modeling and cuing for hand placement/movement but patient had difficulty with proper arm/hand movements Simulated  activities for:  2. Coordination activities for BUE hands to perform more accurate fine motor tasks such as brushing his teeth and phone use including turning small pegs in peg board; patient did fairly well with this task. Patient did drop pegs multiple times but was able to self-correct. 3. Putting on shirt with cues to push his right  UE with increased amplitude, and to begin with RUE into sleeve and "whip shirt around back" and no assist from PT  5 times Patient struggled the most with being able to bring the shirt across his back and then with placing his right arm initially into the sleeve. Once his fingers were at the top opening of the sleeve he did well with pushing his arm through.  He is able to perform task with supervision in unsupported sitting on the mat table. Patient pulled shirt over his head to doff shirt and this method consistently worked well for the patient and he was able to perform doffing independently but still needs some assist with donning. Attempted alternate method of putting left arm in sleeve and then he would hold the shirt material by the opposite shoulder, tuck his head and pull the shirt over the back of his head and that way he was able to get the shirt farther across his back and neck so that he had an easier time grabbing the shirt on the opposite side.He is able to wear his watch and he has learned to rotate his wrist 90 deg to get past the snag of watch catching in the shirt.   4. Performedbuttons with shirt x 5 with  finger flicks once. Patient performed this task in arm chair with back support.  5. Tapping activity for raising his legs up high for improved dynamic standing balance and getting legs up on his step stool for getting into bed, practicing on first step and second step of stairs x 10 x 2 repetitions without UEs support with one small loss of balance that he was able to self-correct.   Gait Training: Functional mobility without assistive device with  BIG arm swing x 300 feet and cues for big arm swing with cues to maintain erect posture and to not lean forward and accelerate when he gets fatigued. Patient required one seated rest break secondary to fatigue.   Daily Homework Activity: reaching big to open doors                            PT Education - 06/30/16 1305    Education provided Yes   Education Details LSVT BIG   Person(s) Educated Patient   Methods Explanation   Comprehension Verbalized understanding;Returned demonstration;Verbal cues required             PT Long Term Goals - 06/26/16 1308      PT LONG TERM GOAL #1   Title Patient will be  independent in home exercise program to improve strength/mobility for better functional independence with ADLs.   Baseline patient is performing LSVT BIG   Time 4   Period Weeks   Status On-going     PT LONG TERM GOAL #2   Title Patient (> 24 years old) will complete five times sit to stand test in < 15 seconds indicating an increased LE strength and improved balance.   Baseline 17.29 sec   Time 4   Period Weeks   Status Partially Met     PT LONG TERM GOAL #3   Title Patient will increase Berg Balance score by > 6 points to demonstrate decreased fall risk during functional activities.   Baseline 42/56 berg   Time 4   Period Weeks   Status Partially Met     PT LONG TERM GOAL #4   Title Patient will increase six minute walk test distance to >1000 for progression to community ambulator and improve gait ability   Baseline 800 feet   Time 4   Period Weeks   Status Partially Met     PT LONG TERM GOAL #5   Title Patient will increase 10 meter walk test to >1.61ms as to improve gait speed for better community ambulation and to reduce fall risk   Baseline 1.25 m/ sec   Time 4   Period Weeks   Status Partially Met     PT LONG TERM GOAL #7   Title Patient will improve coordination B hands to improve ability to use phone and computer indicated by a  decrease of 9 hole peg test of 5 seconds.   Baseline left 34.73 sec, right 27.72   Period Weeks   Status Partially Met               Plan - 06/30/16 1306    Clinical Impression Statement Min cueing needed to appropriately perform LSVT tasks with leg, hand, and head position. Decreased coordination demonstrated requiring consistent verbal cueing to correct form.. Patient continues to demonstrate some in coordination of movement with select exercises such as rock side ways and stepping backwards. Patient responds well to verbal and tactile cues to correct form and technique. SBA for safety with activities and cues to increase intensity and amplitude of movements throughout session.   Rehab Potential Good   Clinical Impairments Affecting Rehab Potential This patient presents with 2, personal factors/ comorbidities including falls, and current situation., and 4  body elements including body structures and functions, activity limitations and or participation restrictions:  unsteady gait, unsteady transfers, decreased standing balance, decreased gait.  Patient's condition is evolving.   PT Frequency 4x / week   PT Duration 4 weeks   PT Treatment/Interventions Gait training;Stair training;Neuromuscular re-education;Balance training;Therapeutic exercise;Therapeutic activities;Patient/family education   PT Next Visit Plan LSVT BIG   PT Home Exercise Plan LSVT BIG   Consulted and Agree with Plan of Care Patient      Patient will benefit from skilled therapeutic intervention in order to improve the following deficits and impairments:  Abnormal gait, Decreased balance, Decreased endurance, Decreased mobility, Difficulty walking, Cardiopulmonary status limiting activity, Decreased activity tolerance, Decreased safety awareness, Decreased strength, Postural dysfunction, Decreased coordination, Obesity  Visit Diagnosis: Difficulty in walking, not elsewhere classified  Muscle weakness  (generalized)     Problem List There are no active problems to display for this patient. KAlanson Puls PT, DPT  [place] 06/30/2016, 1:20 PM  [facility]  SERVICES [place], [place], [address] Phone: [phone]   Fax:  [phone]  Name: Ian Gill MRN: [id number] Date of Birth: 13-Feb-1945

## 2016-07-01 ENCOUNTER — Encounter: Payer: No Typology Code available for payment source | Admitting: Occupational Therapy

## 2016-07-01 ENCOUNTER — Ambulatory Visit: Payer: Medicare Other | Admitting: Physical Therapy

## 2016-07-01 ENCOUNTER — Encounter: Payer: Self-pay | Admitting: Physical Therapy

## 2016-07-01 ENCOUNTER — Encounter: Payer: No Typology Code available for payment source | Admitting: Speech Pathology

## 2016-07-01 DIAGNOSIS — R262 Difficulty in walking, not elsewhere classified: Secondary | ICD-10-CM

## 2016-07-01 DIAGNOSIS — M6281 Muscle weakness (generalized): Secondary | ICD-10-CM

## 2016-07-01 NOTE — Therapy (Addendum)
[place] MAIN [facility] SERVICES [address] [place], [place], [address] Phone: [phone]   Fax:  [phone]  Physical Therapy Treatment  Patient Details  Name: Ian Gill MRN: [id number] Date of Birth: [birth year] Referring Provider: Vladimir Crofts  Encounter Date: 07/01/2016      PT End of Session - 07/01/16 1308    Visit Number 12   Number of Visits 17   Date for PT Re-Evaluation 06/30/16   Authorization Type 2/10   PT Start Time 0102   PT Stop Time 0200   PT Time Calculation (min) 58 min   Equipment Utilized During Treatment Gait belt   Activity Tolerance Patient tolerated treatment well;Patient limited by fatigue   Behavior During Therapy [facility] for tasks assessed/performed      Past Medical History:  Diagnosis Date  . Anxiety   . COPD (chronic obstructive pulmonary disease) ([place])   . Depression   . Parkinson's disease ([place])   . Seizures ([place])   . Stroke [facility])     Past Surgical History:  Procedure Laterality Date  . Shoulder surgery    . SPINAL FUSION    . TOTAL KNEE ARTHROPLASTY      There were no vitals filed for this visit.      Subjective Assessment - 07/01/16 1307    Subjective Patient states his back hurts mildly today,  Patient states he has not been using his walker this week.   Pertinent History Patient is having falls that started 2 years ago. Patient moved into [place] apartments in August 2018. He began using the rollator 1 year ago. He has had falls and stumbles and walks too fast and runs into things. He has episods of stumbling 3- 4 times a week.    Limitations Standing;Walking   How long can you stand comfortably? 10 minutes   How long can you walk comfortably? 10 minutes   Patient Stated Goals to walk without getting fatigued .    Currently in Pain? Yes   Pain Score 1    Pain Location Back   Pain Orientation Lower;Left   Pain Descriptors / Indicators Sore   Pain Type Chronic pain   Pain  Onset More than a month ago   Pain Frequency Constant   Multiple Pain Sites No      Treatment: Patient seen forLSVT Daily Session Maximal Daily Exercises for facilitation/coordination of movement Maximum Sustained Movements are designed to rescale the amplitude of movement output for generalization to daily functional activities. Performed as follows for 1 set of 10 repetitions each multidirectional sustained movements: 1) Floor to ceiling - patient needed occasional modeling cue to reach forward 2) Side to side multidirectional movementperformed insitting-modeling and tactile for left forearm supination and to stretch out LE 3) Step and reach forward- patient needed modeling cue to reach out to the side and not forward 4) Step and reach backwards-patient  was very limited in the amount of forward trunk flexion and with bringing his arm back into extension, and raised his toe 50% of the time, needs cues for start position 5) Step and reach sideways-modeling for supination of forearm and for initial sequencing but then patient was able to perform without further cuing, needs cues to turn his head 6) Rock and reach forward/backward-patient needed modeling with this activity. Patient needs cues to alternate his UE swinging movements  7) Rock and reach sideways-Patient needed modeling and cuing for head turn and for supination of forearm especially on  the left. He needs cues to finish this exercise.   Patient needed several short, sitting rest breaks during above exercises secondary to fatigue. Patient did well with counting out loud for exercise reps. His voice was clear and audible. Patient is trying to perform exercises by memory and not look at his written hand out.   Functional Component Task: 1.Sit to stand functional component task with supervision 10 reps; modeling and cuing for hand placement/movement but patient had difficulty with proper arm/hand movements Simulated activities  for:  2. Coordination activities for BUE hands to perform more accurate fine motor tasks such as brushing his teeth and phone use including turning small pegs in peg board; patient did fairly well with this task. Patient did drop pegs multiple times but was able to self-correct. 3. Putting on shirt with cues to push his right  UE with increased amplitude, and to begin with RUE into sleeve and "whip shirt around back" and no assist from PT  5 times Patient struggled the most with being able to bring the shirt across his back and then with placing his right arm initially into the sleeve. Once his fingers were at the top opening of the sleeve he did well with pushing his arm through. He is able to perform task with supervision in unsupported sitting on the mat table. Patient pulled shirt over his head to doff shirt and this method consistently worked well for the patient and he was able to perform doffing independently but still needs some assist with donning. Attempted alternate method of putting left arm in sleeve and then he would hold the shirt material by the opposite shoulder, tuck his head and pull the shirt over the back of his head and that way he was able to get the shirt farther across his back and neck so that he had an easier time grabbing the shirt on the opposite side.He is able to wear his watch and he has learned to rotate his wrist 90 deg to get past the snag of watch catching in the shirt.   4. Performedbuttons with shirt x 5 with finger flicks once. Patient performed this task in arm chair with back support.  5. Tapping activity for raising his legs up high for improved dynamic standing balance and getting legs up on his step stool for getting into bed, practicing on first step and second step of stairs x 10 x 2 repetitions without UEs support with one small loss of balance that he was able to self-correct.   Gait Training: Functional mobility without assistive device with BIG arm swing  x 300 feet and cues for big arm swing with cues to maintain erect posture and to not lean forward and accelerate when he gets fatigued. Patient required one seated rest break secondary to fatigue.   Daily Homework Activity: turning papers Big                           PT Education - 07/01/16 1308    Education provided Yes   Education Details LSVT BIG   Person(s) Educated Patient   Methods Explanation   Comprehension Verbalized understanding             PT Long Term Goals - 06/26/16 1308      PT LONG TERM GOAL #1   Title Patient will be independent in home exercise program to improve strength/mobility for better functional independence with ADLs.   Baseline patient is  performing LSVT BIG   Time 4   Period Weeks   Status On-going     PT LONG TERM GOAL #2   Title Patient (> 26 years old) will complete five times sit to stand test in < 15 seconds indicating an increased LE strength and improved balance.   Baseline 17.29 sec   Time 4   Period Weeks   Status Partially Met     PT LONG TERM GOAL #3   Title Patient will increase Berg Balance score by > 6 points to demonstrate decreased fall risk during functional activities.   Baseline 42/56 berg   Time 4   Period Weeks   Status Partially Met     PT LONG TERM GOAL #4   Title Patient will increase six minute walk test distance to >1000 for progression to community ambulator and improve gait ability   Baseline 800 feet   Time 4   Period Weeks   Status Partially Met     PT LONG TERM GOAL #5   Title Patient will increase 10 meter walk test to >1.20ms as to improve gait speed for better community ambulation and to reduce fall risk   Baseline 1.25 m/ sec   Time 4   Period Weeks   Status Partially Met     PT LONG TERM GOAL #7   Title Patient will improve coordination B hands to improve ability to use phone and computer indicated by a decrease of 9 hole peg test of 5 seconds.   Baseline left 34.73 sec,  right 27.72   Period Weeks   Status Partially Met               Plan - 07/01/16 1309    Clinical Impression Statement Patient is able to get his coat on correctly and faster with new method and using large amplitude motions. Patient is able to perform his exercises with modeling and verbal correction and cueing to step in the correct position. Patient need cueing for BIG arm swing, Patient needs cueing for BIG swing and adding dual cognitive loads and distracters.Patient will benefit from continued skilled PT to improve balance, and mobility.    Rehab Potential Good   Clinical Impairments Affecting Rehab Potential This patient presents with 2, personal factors/ comorbidities including falls, and current situation., and 4  body elements including body structures and functions, activity limitations and or participation restrictions:  unsteady gait, unsteady transfers, decreased standing balance, decreased gait.  Patient's condition is evolving.   PT Frequency 4x / week   PT Duration 4 weeks   PT Treatment/Interventions Gait training;Stair training;Neuromuscular re-education;Balance training;Therapeutic exercise;Therapeutic activities;Patient/family education   PT Next Visit Plan LSVT BIG   PT Home Exercise Plan LSVT BIG   Consulted and Agree with Plan of Care Patient      Patient will benefit from skilled therapeutic intervention in order to improve the following deficits and impairments:  Abnormal gait, Decreased balance, Decreased endurance, Decreased mobility, Difficulty walking, Cardiopulmonary status limiting activity, Decreased activity tolerance, Decreased safety awareness, Decreased strength, Postural dysfunction, Decreased coordination, Obesity  Visit Diagnosis: Difficulty in walking, not elsewhere classified  Muscle weakness (generalized)     Problem List There are no active problems to display for this patient.  KAlanson Puls PT, DPT [place] 07/01/2016, 1:11 PM  [facility]SERVICES [address] StreetRNolic NAl[place] [address]Phone: [phone]  Fax:  [phone] Name: Ian THRUNMRN:  [id number] Date of Birth: 07-12-1945

## 2016-07-02 ENCOUNTER — Encounter: Payer: No Typology Code available for payment source | Admitting: Speech Pathology

## 2016-07-02 ENCOUNTER — Encounter: Payer: No Typology Code available for payment source | Admitting: Occupational Therapy

## 2016-07-02 ENCOUNTER — Ambulatory Visit: Payer: Medicare Other | Admitting: Physical Therapy

## 2016-07-02 ENCOUNTER — Encounter: Payer: Self-pay | Admitting: Physical Therapy

## 2016-07-02 DIAGNOSIS — R262 Difficulty in walking, not elsewhere classified: Secondary | ICD-10-CM | POA: Diagnosis not present

## 2016-07-02 DIAGNOSIS — M6281 Muscle weakness (generalized): Secondary | ICD-10-CM

## 2016-07-02 NOTE — Therapy (Signed)
[place] MAIN [facility] SERVICES [address] [place], [place], [address] Phone: [phone]   Fax:  [phone]  Physical Therapy Treatment  Patient Details  Name: Ian Gill MRN: [id number] Date of Birth: August 16, 1945 Referring Provider: Vladimir Crofts  Encounter Date: 07/02/2016      PT End of Session - 07/02/16 1314    Visit Number 13   Number of Visits 17   Date for PT Re-Evaluation 06/30/16   Authorization Type 3/10   PT Start Time 0100   PT Stop Time 0200   PT Time Calculation (min) 60 min   Equipment Utilized During Treatment Gait belt   Activity Tolerance Patient tolerated treatment well;Patient limited by fatigue   Behavior During Therapy [facility] for tasks assessed/performed      Past Medical History:  Diagnosis Date  . Anxiety   . COPD (chronic obstructive pulmonary disease) ([place])   . Depression   . Parkinson's disease ([place])   . Seizures ([place])   . Stroke [facility])     Past Surgical History:  Procedure Laterality Date  . Shoulder surgery    . SPINAL FUSION    . TOTAL KNEE ARTHROPLASTY      There were no vitals filed for this visit.      Subjective Assessment - 07/02/16 1312    Subjective Patient states his back does not hurt today.  Patient states he has not been using his walker this week.   Pertinent History Patient is having falls that started 2 years ago. Patient moved into [address] [place] apartments in August 2018. He began using the rollator 1 year ago. He has had falls and stumbles and walks too fast and runs into things. He has episods of stumbling 3- 4 times a week.    Limitations Standing;Walking   How long can you stand comfortably? 10 minutes   How long can you walk comfortably? 10 minutes   Patient Stated Goals to walk without getting fatigued .    Currently in Pain? No/denies   Pain Score 0-No pain   Pain Onset More than a month ago   Multiple Pain Sites No      Treatment: Patient seen forLSVT Daily  Session Maximal Daily Exercises for facilitation/coordination of movement Maximum Sustained Movements are designed to rescale the amplitude of movement output for generalization to daily functional activities. Performed as follows for 1 set of 10 repetitions each multidirectional sustained movements: 1) Floor to ceiling - patient needed occasional modeling cue to reach forward 2) Side to side multidirectional movementperformed insitting-modeling and tactile for left forearm supination and to stretch out LE 3) Step and reach forward- patient needed modeling cue to reach out to the side and not forward 4) Step and reach backwards-patient was very limited in the amount of forward trunk flexion and with bringing his arm back into extension, and raised his toe 50% of the time, needs cues for start position 5) Step and reach sideways-modeling for supination of forearm and for initial sequencing but then patient was able to perform without further cuing, needs cues to turn his head 6) Rock and reach forward/backward-patient needed modeling with this activity. Patient needs cues to alternate his UE swinging movements  7) Rock and reach sideways-Patient needed modeling and cuing for head turn and for supination of forearm especially on the left. He needs cues to finish this exercise.  Patient needed several short, sitting rest breaks during above exercises secondary to fatigue. Patient did well with counting  out loud for exercise reps. His voice was clear and audible. Patient is trying to perform exercises by memory and not look at his written hand out.   Functional Component Task: 1.Sit to stand functional component task with supervision 10 reps; modeling and cuing for hand placement/movement but patient had difficulty with proper arm/hand movements Simulated activities for:  2. Coordination activities for BUE hands to perform more accurate fine motor tasks such as brushing his teeth and phone use  including turning small pegs in peg board; patient did fairly well with this task. Patient did drop pegs multiple times but was able to self-correct. 3. Putting on shirt with cues to push his right UE with increased amplitude, and to begin with RUE into sleeve and "whip shirt around back" and no assist from PT 5 times Patient struggled the most with being able to bring the shirt across his back and then with placing his right arm initially into the sleeve. Once his fingers were at the top opening of the sleeve he did well with pushing his arm through. He is able to perform task with supervision in unsupported sitting on the mat table. Patient pulled shirt over his head to doff shirt and this method consistently worked well for the patient and he was able to perform doffing independently but still needs some assist with donning. He had trouble with his shirt getting tangled up he has learned to rotate his wrist 90 deg to get past the snag of watch catching in the shirt.  4. Performedbuttons with shirt x 5 with finger flicks once. Patient performed this task in arm chair with back support.  5. Tappingactivity for raising his legs up high for improved dynamic standing balance and getting legs up on his step stool for getting into bed, practicing on first step and second step of stairs x 10 x 2 repetitions without UEs support with one small loss of balance that he was able to self-correct.   Gait Training: Functional mobility without assistive device with BIG arm swing x 39fet and cues for big arm swing with cues to maintain erect posture and to not lean forward and accelerate when he gets fatigued. Patient required one seated rest break secondary to fatigue.   Daily Homework Activity: carry items big                            PT Education - 07/02/16 1313    Education provided Yes   Education Details LSVT BIG   Person(s) Educated Patient   Methods Explanation    Comprehension Verbalized understanding;Returned demonstration;Verbal cues required             PT Long Term Goals - 06/26/16 1308      PT LONG TERM GOAL #1   Title Patient will be independent in home exercise program to improve strength/mobility for better functional independence with ADLs.   Baseline patient is performing LSVT BIG   Time 4   Period Weeks   Status On-going     PT LONG TERM GOAL #2   Title Patient (> 630years old) will complete five times sit to stand test in < 15 seconds indicating an increased LE strength and improved balance.   Baseline 17.29 sec   Time 4   Period Weeks   Status Partially Met     PT LONG TERM GOAL #3   Title Patient will increase Berg Balance score by > 6 points  to demonstrate decreased fall risk during functional activities.   Baseline 42/56 berg   Time 4   Period Weeks   Status Partially Met     PT LONG TERM GOAL #4   Title Patient will increase six minute walk test distance to >1000 for progression to community ambulator and improve gait ability   Baseline 800 feet   Time 4   Period Weeks   Status Partially Met     PT LONG TERM GOAL #5   Title Patient will increase 10 meter walk test to >1.37ms as to improve gait speed for better community ambulation and to reduce fall risk   Baseline 1.25 m/ sec   Time 4   Period Weeks   Status Partially Met     PT LONG TERM GOAL #7   Title Patient will improve coordination B hands to improve ability to use phone and computer indicated by a decrease of 9 hole peg test of 5 seconds.   Baseline left 34.73 sec, right 27.72   Period Weeks   Status Partially Met               Plan - 07/02/16 1314    Clinical Impression Statement Patient continues to require modeling and limited verbal cuing to perform LSVT BIG exercises. Patient has difficulty with right forearm supination during majority of the exercises and with forward trunk bending during backwards step exercise. Patient did well  with buttoning activity but struggled with donning shirt. Patient does well with doffing shirt. Patient tried a variation to help with donning shirt and this method seemed to work well for patient and this was given for his daily homework task this date. Patient would benefit from contined participation in the [place] to further address goals and functional deficits.    Rehab Potential Good   Clinical Impairments Affecting Rehab Potential This patient presents with 2, personal factors/ comorbidities including falls, and current situation., and 4  body elements including body structures and functions, activity limitations and or participation restrictions:  unsteady gait, unsteady transfers, decreased standing balance, decreased gait.  Patient's condition is evolving.   PT Frequency 4x / week   PT Duration 4 weeks   PT Treatment/Interventions Gait training;Stair training;Neuromuscular re-education;Balance training;Therapeutic exercise;Therapeutic activities;Patient/family education   PT Next Visit Plan LSVT BIG   PT Home Exercise Plan LSVT BIG   Consulted and Agree with Plan of Care Patient      Patient will benefit from skilled therapeutic intervention in order to improve the following deficits and impairments:  Abnormal gait, Decreased balance, Decreased endurance, Decreased mobility, Difficulty walking, Cardiopulmonary status limiting activity, Decreased activity tolerance, Decreased safety awareness, Decreased strength, Postural dysfunction, Decreased coordination, Obesity  Visit Diagnosis: Difficulty in walking, not elsewhere classified  Muscle weakness (generalized)     Problem List There are no active problems to display for this patient.  KAlanson Puls PT, DPT [place] 07/02/2016, 1:16 PM  CNorth StarMAIN [facility]ERVICES [address] [address]Phone: [phone]  Fax:  [phone] Name:  Ian MCCUBBINMRN: [id number] of Birth: 124-Feb-1947

## 2016-07-03 ENCOUNTER — Ambulatory Visit: Payer: Medicare Other | Admitting: Physical Therapy

## 2016-07-03 ENCOUNTER — Encounter: Payer: No Typology Code available for payment source | Admitting: Speech Pathology

## 2016-07-03 ENCOUNTER — Encounter: Payer: No Typology Code available for payment source | Admitting: Occupational Therapy

## 2016-07-03 ENCOUNTER — Encounter: Payer: Self-pay | Admitting: Physical Therapy

## 2016-07-03 DIAGNOSIS — R262 Difficulty in walking, not elsewhere classified: Secondary | ICD-10-CM

## 2016-07-03 DIAGNOSIS — M6281 Muscle weakness (generalized): Secondary | ICD-10-CM

## 2016-07-03 DIAGNOSIS — G20 Parkinson's disease: Secondary | ICD-10-CM

## 2016-07-03 NOTE — Therapy (Signed)
[place] MAIN [facility] SERVICES [address] [place], [place], [address] Phone: [phone]   Fax:  [phone]  Physical Therapy Treatment  Patient Details  Name: Ian Gill MRN: [id number] Date of Birth: 1945/12/19 Referring Provider: Vladimir Crofts  Encounter Date: 07/03/2016      PT End of Session - 07/03/16 1312    Visit Number 14   Number of Visits 17   Date for PT Re-Evaluation 07/14/16   Authorization Type 4/10   PT Start Time 0100   PT Stop Time 0200   PT Time Calculation (min) 60 min   Equipment Utilized During Treatment Gait belt   Activity Tolerance Patient tolerated treatment well;Patient limited by fatigue   Behavior During Therapy [facility] for tasks assessed/performed      Past Medical History:  Diagnosis Date  . Anxiety   . COPD (chronic obstructive pulmonary disease) ([place])   . Depression   . Parkinson's disease ([place])   . Seizures ([place])   . Stroke [facility])     Past Surgical History:  Procedure Laterality Date  . Shoulder surgery    . SPINAL FUSION    . TOTAL KNEE ARTHROPLASTY      There were no vitals filed for this visit.      Subjective Assessment - 07/03/16 1312    Subjective Patient states his back does not hurt today.  Patient states he has not been using his walker this week.He says that he is walking better and not running into things.   Pertinent History Patient is having falls that started 2 years ago. Patient moved into [address] apartments in August 2018. He began using the rollator 1 year ago. He has had falls and stumbles and walks too fast and runs into things. He has episods of stumbling 3- 4 times a week.    Limitations Standing;Walking   How long can you stand comfortably? 10 minutes   How long can you walk comfortably? 10 minutes   Patient Stated Goals to walk without getting fatigued .    Currently in Pain? No/denies   Pain Score 0-No pain   Pain Onset More than a month ago   Multiple Pain  Sites No      Treatment: Patient seen forLSVT Daily Session Maximal Daily Exercises for facilitation/coordination of movement Maximum Sustained Movements are designed to rescale the amplitude of movement output for generalization to daily functional activities. Performed as follows for 1 set of 10 repetitions each multidirectional sustained movements: 1) Floor to ceiling - patient needed occasional modeling cue to reach forward 2) Side to side multidirectional movementperformed insitting-modeling and tactile for left forearm supination and to stretch out LE 3) Step and reach forward- patient needed modeling cue to reach out to the side and not forward 4) Step and reach backwards-patient was very limited in the amount of forward trunk flexion and with bringing his arm back into extension, and raised his toe 50% of the time, needs cues for start position 5) Step and reach sideways-modeling for supination of forearm and for initial sequencing but then patient was able to perform without further cuing, needs cues to turn his head 6) Rock and reach forward/backward-patient needed modeling with this activity. Patient needs cues to alternate his UE swinging movements  7) Rock and reach sideways-Patient needed modeling and cuing for head turn and for supination of forearm especially on the left. He needs cues to finish this exercise.  Patient needed several short, sitting rest breaks  during above exercises secondary to fatigue. Patient did well with counting out loud for exercise reps. His voice was clear and audible. Patient is trying to perform exercises by memory and not look at his written hand out.   Functional Component Task: 1.Sit to stand functional component task with supervision 10 reps; modeling and cuing for hand placement/movement but patient had difficulty with proper arm/hand movements Simulated activities for:  2. Coordination activities for BUE hands to perform more accurate  fine motor tasks such as brushing his teeth and phone use including turning small pegs in peg board; patient did fairly well with this task. Patient did drop pegs multiple times but was able to self-correct. 3. Putting on shirt with cues to push his right UE with increased amplitude, and to begin with RUE into sleeve and "whip shirt around back" and no assist from PT 5 times Patient struggled the most with being able to bring the shirt across his back and then with placing his right arm initially into the sleeve. Once his fingers were at the top opening of the sleeve he did well with pushing his arm through. He is able to perform task with supervision in unsupported sitting on the mat table. Patient pulled shirt over his head to doff shirt and this method consistently worked well for the patient and he was able to perform doffing independently but still needs some assist with donning. He had trouble with his shirt getting tangled up he has learned to rotate his wrist 90 deg to get past the snag of watch catching in the shirt.  4. Performedbuttons with shirt x 5 with finger flicks once. Patient performed this task in arm chair with back support.  5. Tappingactivity for raising his legs up high for improved dynamic standing balance and getting legs up on his step stool for getting into bed, practicing on first step and second step of stairs x 10 x 2 repetitions without UEs support with one small loss of balance that he was able to self-correct.   Gait Training: Functional mobility without assistive device with BIG arm swing x 383fet and cues for big arm swing with cues to maintain erect posture and to not lean forward and accelerate when he gets fatigued. Patient required one seated rest break secondary to fatigue.   Daily Homework Activity: stop big when stopping                            PT Education - 07/03/16 1312    Education provided Yes   Education Details LSVT  BIG   Person(s) Educated Patient   Methods Explanation;Demonstration;Verbal cues   Comprehension Verbalized understanding;Returned demonstration;Verbal cues required             PT Long Term Goals - 06/26/16 1308      PT LONG TERM GOAL #1   Title Patient will be independent in home exercise program to improve strength/mobility for better functional independence with ADLs.   Baseline patient is performing LSVT BIG   Time 4   Period Weeks   Status On-going     PT LONG TERM GOAL #2   Title Patient (> 625years old) will complete five times sit to stand test in < 15 seconds indicating an increased LE strength and improved balance.   Baseline 17.29 sec   Time 4   Period Weeks   Status Partially Met     PT LONG TERM GOAL #3  Title Patient will increase Berg Balance score by > 6 points to demonstrate decreased fall risk during functional activities.   Baseline 42/56 berg   Time 4   Period Weeks   Status Partially Met     PT LONG TERM GOAL #4   Title Patient will increase six minute walk test distance to >1000 for progression to community ambulator and improve gait ability   Baseline 800 feet   Time 4   Period Weeks   Status Partially Met     PT LONG TERM GOAL #5   Title Patient will increase 10 meter walk test to >1.2ms as to improve gait speed for better community ambulation and to reduce fall risk   Baseline 1.25 m/ sec   Time 4   Period Weeks   Status Partially Met     PT LONG TERM GOAL #7   Title Patient will improve coordination B hands to improve ability to use phone and computer indicated by a decrease of 9 hole peg test of 5 seconds.   Baseline left 34.73 sec, right 27.72   Period Weeks   Status Partially Met               Plan - 07/03/16 1314    Clinical Impression Statement Min cueing needed to appropriately perform LSVT tasks with leg, hand, and head position. Decreased coordination demonstrated requiring consistent verbal cueing to correct form.  Cognitive understanding of task was delayed. Patient continues to demonstrate some in coordination of movement with select exercises such as rock and reach and stepping backwards. Patient responds well to verbal and tactile cues to correct form and technique. CGA to SBA for safety with activities. Cues to increase intensity and amplitude of movements throughout session   Rehab Potential Good   Clinical Impairments Affecting Rehab Potential This patient presents with 2, personal factors/ comorbidities including falls, and current situation., and 4  body elements including body structures and functions, activity limitations and or participation restrictions:  unsteady gait, unsteady transfers, decreased standing balance, decreased gait.  Patient's condition is evolving.   PT Frequency 4x / week   PT Duration 4 weeks   PT Treatment/Interventions Gait training;Stair training;Neuromuscular re-education;Balance training;Therapeutic exercise;Therapeutic activities;Patient/family education   PT Next Visit Plan LSVT BIG   PT Home Exercise Plan LSVT BIG   Consulted and Agree with Plan of Care Patient      Patient will benefit from skilled therapeutic intervention in order to improve the following deficits and impairments:  Abnormal gait, Decreased balance, Decreased endurance, Decreased mobility, Difficulty walking, Cardiopulmonary status limiting activity, Decreased activity tolerance, Decreased safety awareness, Decreased strength, Postural dysfunction, Decreased coordination, Obesity  Visit Diagnosis: Difficulty in walking, not elsewhere classified  Muscle weakness (generalized)  Parkinson's disease ([place]     Problem List There are no active problems to display for this patient. KAlanson Puls PT, DPT  [place] 07/03/2016, 1:18 PM  CKooskiaMAIN [facility]Trust[facility] Lub[facility]ockSERVICES [address] [address]Phone: [phone]  Fax:   [phone] Name: WCANDICE LUNNEYMRN: [id number] of Birth: 1Apr 25, 1947

## 2016-07-07 ENCOUNTER — Encounter: Payer: Self-pay | Admitting: Physical Therapy

## 2016-07-07 ENCOUNTER — Encounter: Payer: No Typology Code available for payment source | Admitting: Speech Pathology

## 2016-07-07 ENCOUNTER — Ambulatory Visit: Payer: Medicare Other | Admitting: Physical Therapy

## 2016-07-07 DIAGNOSIS — M6281 Muscle weakness (generalized): Secondary | ICD-10-CM

## 2016-07-07 DIAGNOSIS — R262 Difficulty in walking, not elsewhere classified: Secondary | ICD-10-CM

## 2016-07-07 NOTE — Therapy (Signed)
[place] MAIN [facility] SERVICES [address] [place], [place], [address] Phone: [phone]   Fax:  [phone]  Physical Therapy Treatment  Patient Details  Name: Ian Gill MRN: [id number] Date of Birth: November 13, 1945 Referring Provider: Vladimir Crofts  Encounter Date: 07/07/2016      PT End of Session - 07/07/16 1319    Visit Number 15   Number of Visits 17   Date for PT Re-Evaluation 07/14/16   Authorization Type 5/10   PT Start Time 0102   PT Stop Time 0200   PT Time Calculation (min) 58 min   Equipment Utilized During Treatment Gait belt   Activity Tolerance Patient tolerated treatment well;Patient limited by fatigue   Behavior During Therapy [facility] for tasks assessed/performed      Past Medical History:  Diagnosis Date  . Anxiety   . COPD (chronic obstructive pulmonary disease) ([place])   . Depression   . Parkinson's disease ([place])   . Seizures ([place])   . Stroke [facility])     Past Surgical History:  Procedure Laterality Date  . Shoulder surgery    . SPINAL FUSION    . TOTAL KNEE ARTHROPLASTY      There were no vitals filed for this visit.      Subjective Assessment - 07/07/16 1318    Subjective Patient states his back does not hurt today.  Patient states he has not been using his walker this week.He says that he is walking better and not running into things.   Pertinent History Patient is having falls that started 2 years ago. Patient moved into [place] apartments in August 2018. He began using the rollator 1 year ago. He has had falls and stumbles and walks too fast and runs into things. He has episods of stumbling 3- 4 times a week.    Limitations Standing;Walking   How long can you stand comfortably? 10 minutes   How long can you walk comfortably? 10 minutes   Patient Stated Goals to walk without getting fatigued .    Currently in Pain? No/denies   Pain Score 0-No pain   Pain Onset More than a month ago       Treatment: Patient seen for  LSVT Daily Session Maximal Daily Exercises for facilitation/coordination of movement Maximum Sustained Movements are designed to rescale the amplitude of movement output for generalization to daily functional activities. Performed as follows for 1 set of 10 repetitions each multidirectional sustained movements: 1) Floor to ceiling - patient needed occasional modeling cue to reach forward 2) Side to side multidirectional movementperformed insitting-modeling and tactile for left forearm supination and to stretch out LE 3) Step and reach forward- patient needed modeling cue to reach out to the side and not forward 4) Step and reach backwards-patient was very limited in the amount of forward trunk flexion and with bringing his arm back into extension, and raised his toe 50% of the time, needs cues for start position 5) Step and reach sideways-modeling for supination of forearm and for initial sequencing but then patient was able to perform without further cuing, needs cues to turn his head 6) Rock and reach forward/backward-patient needed modeling with this activity. Patient needs cues to alternate his UE swinging movements  7) Rock and reach sideways-Patient needed modeling and cuing for head turn and for supination of forearm especially on the left. He needs cues to finish this exercise.  Patient needed several short, sitting rest breaks during above exercises secondary  to fatigue. Patient did well with counting out loud for exercise reps. His voice was clear and audible. Patient is trying to perform exercises by memory and not look at his written hand out.   Functional Component Task: 1.Sit to stand functional component task with supervision 10 reps; modeling and cuing for hand placement/movement but patient had difficulty with proper arm/hand movements Simulated activities for:  2. Coordination activities for BUE hands to perform more accurate fine motor tasks  such as brushing his teeth and phone use including turning small pegs in peg board; patient did fairly well with this task. Patient did drop pegs multiple times but was able to self-correct. 3. Putting on shirt with cues to push his right UE with increased amplitude, and to begin with RUE into sleeve and "whip shirt around back" and no assist from PT 5 times Patient struggled the most with being able to bring the shirt across his back and then with placing his right arm initially into the sleeve. Once his fingers were at the top opening of the sleeve he did well with pushing his arm through. . Patient pulled shirt over his head to doff shirt and this method. His watch got stuck today when putting in LUE into sleeve.   He had trouble with his shirt getting tangled up he has learned to rotate his wrist 90 deg to get past the snag of watch catching in the shirt. He had a thinner shirt which was more difficult. 4. Performedbuttons with shirt x 5 with finger flicks once. Patient performed this task in arm chair with back support.  5. Tappingactivity for raising his legs up high for improved dynamic standing balance and getting legs up on his step stool for getting into bed, practicing on first step and second step of stairs x 10 x 2 repetitions without UEs support with one small loss of balance that he was able to self-correct.   Gait Training: Functional mobility without assistive device with BIG arm swing x 396fet and cues for big arm swing with cues to maintain erect posture and to not lean forward and accelerate when he gets fatigued. Patient required one seated rest break secondary to fatigue.   Daily Homework Activity: sitting and standing BIG                            PT Education - 07/07/16 1319    Education provided Yes   Education Details LSVT BIG   Person(s) Educated Patient   Methods Explanation   Comprehension Verbalized understanding              PT Long Term Goals - 06/26/16 1308      PT LONG TERM GOAL #1   Title Patient will be independent in home exercise program to improve strength/mobility for better functional independence with ADLs.   Baseline patient is performing LSVT BIG   Time 4   Period Weeks   Status On-going     PT LONG TERM GOAL #2   Title Patient (> 534years old) will complete five times sit to stand test in < 15 seconds indicating an increased LE strength and improved balance.   Baseline 17.29 sec   Time 4   Period Weeks   Status Partially Met     PT LONG TERM GOAL #3   Title Patient will increase Berg Balance score by > 6 points to demonstrate decreased fall risk during functional activities.  Baseline 42/56 berg   Time 4   Period Weeks   Status Partially Met     PT LONG TERM GOAL #4   Title Patient will increase six minute walk test distance to >1000 for progression to community ambulator and improve gait ability   Baseline 800 feet   Time 4   Period Weeks   Status Partially Met     PT LONG TERM GOAL #5   Title Patient will increase 10 meter walk test to >1.36ms as to improve gait speed for better community ambulation and to reduce fall risk   Baseline 1.25 m/ sec   Time 4   Period Weeks   Status Partially Met     PT LONG TERM GOAL #7   Title Patient will improve coordination B hands to improve ability to use phone and computer indicated by a decrease of 9 hole peg test of 5 seconds.   Baseline left 34.73 sec, right 27.72   Period Weeks   Status Partially Met               Plan - 07/07/16 1320    Clinical Impression Statement Patient continues to require modeling and limited verbal cuing to perform LSVT BIG exercises. Patient has difficulty with right forearm supination during majority of the exercises and with forward trunk bending during backwards step exercise. Patient did well with buttoning activity but struggled with donning shirt. Patient does well with doffing shirt. Patient  tried a variation to help with donning shirt and this method seemed to work well for patient and this was given for his daily homework task this date. Patient would benefit from contined participation [place] the [place] to further address goals and functional deficits.    Rehab Potential Good   Clinical Impairments Affecting Rehab Potential This patient presents with 2, personal factors/ comorbidities including falls, and current situation., and 4  body elements including body structures and functions, activity limitations and or participation restrictions:  unsteady gait, unsteady transfers, decreased standing balance, decreased gait.  Patient's condition is evolving.   PT Frequency 4x / week   PT Duration 4 weeks   PT Treatment/Interventions Gait training;Stair training;Neuromuscular re-education;Balance training;Therapeutic exercise;Therapeutic activities;Patient/family education   PT Next Visit Plan LSVT BIG   PT Home Exercise Plan LSVT BIG   Consulted and Agree with Plan of Care Patient      Patient will benefit from skilled therapeutic intervention in order to improve the following deficits and impairments:  Abnormal gait, Decreased balance, Decreased endurance, Decreased mobility, Difficulty walking, Cardiopulmonary status limiting activity, Decreased activity tolerance, Decreased safety awareness, Decreased strength, Postural dysfunction, Decreased coordination, Obesity  Visit Diagnosis: Difficulty in walking, not elsewhere classified  Muscle weakness (generalized)     Problem List There are no active problems to display for this patient. KAlanson Puls PT, DPT  [place] 07/07/2016, 1:22 PM  [place] [facility] [address] [address]Phone: [phone]  Fax:  [phone] Name: WLANDIN TALLONMRN: [id number] of Birth: 108-Dec-1947

## 2016-07-08 ENCOUNTER — Ambulatory Visit: Payer: Medicare Other | Admitting: Physical Therapy

## 2016-07-08 ENCOUNTER — Encounter: Payer: Self-pay | Admitting: Physical Therapy

## 2016-07-08 DIAGNOSIS — R262 Difficulty in walking, not elsewhere classified: Secondary | ICD-10-CM

## 2016-07-08 DIAGNOSIS — M6281 Muscle weakness (generalized): Secondary | ICD-10-CM

## 2016-07-08 NOTE — Therapy (Signed)
[place] MAIN [facility] SERVICES [address] [place], [place], [address] Phone: [phone]   Fax:  [phone]  Physical Therapy Treatment  Patient Details  Name: Ian Gill MRN: [id number] Date of Birth: 09-Nov-1945 Referring Provider: Vladimir Crofts  Encounter Date: 07/08/2016      PT End of Session - 07/08/16 1439    Visit Number 16   Number of Visits 17   Date for PT Re-Evaluation 07/14/16   Authorization Type 6/10   PT Start Time 0215   PT Stop Time 0315   PT Time Calculation (min) 60 min   Equipment Utilized During Treatment Gait belt   Activity Tolerance Patient tolerated treatment well;Patient limited by fatigue   Behavior During Therapy [facility] for tasks assessed/performed      Past Medical History:  Diagnosis Date  . Anxiety   . COPD (chronic obstructive pulmonary disease) ([place])   . Depression   . Parkinson's disease ([place])   . Seizures ([place])   . Stroke [facility])     Past Surgical History:  Procedure Laterality Date  . Shoulder surgery    . SPINAL FUSION    . TOTAL KNEE ARTHROPLASTY      There were no vitals filed for this visit.      Subjective Assessment - 07/08/16 1438    Subjective Patient reports that it is easier to get dressed now.    Pertinent History Patient is having falls that started 2 years ago. Patient moved into [place] apartments in August 2018. He began using the rollator 1 year ago. He has had falls and stumbles and walks too fast and runs into things. He has episods of stumbling 3- 4 times a week.    Limitations Standing;Walking   How long can you stand comfortably? 10 minutes   How long can you walk comfortably? 10 minutes   Patient Stated Goals to walk without getting fatigued .    Currently in Pain? No/denies   Pain Score 0-No pain   Pain Onset More than a month ago   Multiple Pain Sites No      Treatment: Patient seen for  LSVT Daily Session Maximal Daily Exercises for  facilitation/coordination of movement Maximum Sustained Movements are designed to rescale the amplitude of movement output for generalization to daily functional activities. Performed as follows for 1 set of 10 repetitions each multidirectional sustained movements: 1) Floor to ceiling - patient needed occasional modeling cue to reach forward 2) Side to side multidirectional movementperformed insitting-modeling and tactile for left forearm supination and to stretch out LE 3) Step and reach forward- patient needed modeling cue to reach out to the side and not forward 4) Step and reach backwards-patient was very limited in the amount of forward trunk flexion and with bringing his arm back into extension, and raised his toe 50% of the time, needs cues for start position 5) Step and reach sideways-modeling for supination of forearm and for initial sequencing but then patient was able to perform without further cuing, needs cues to turn his head 6) Rock and reach forward/backward-patient needed modeling with this activity. Patient needs cues to alternate his UE swinging movements  7) Rock and reach sideways-Patient needed modeling and cuing for head turn and for supination of forearm especially on the left. He needs cues to finish this exercise. Patient is mixing up his UE movemets during standing exericises and needs cues to perform the exercises correctly  Patient needed several short, sitting rest breaks  during above exercises secondary to fatigue. Patient did well with counting out loud for exercise reps. His voice was clear and audible. Patient is trying to perform exercises by memory and not look at his written hand out.   Functional Component Task: 1.Sit to stand functional component task with supervision 10 reps; modeling and cuing for hand placement/movement but patient had difficulty with proper arm/hand movements Simulated activities for:  2. Coordination activities for BUE hands to  perform more accurate fine motor tasks such as brushing his teeth and phone use including turning small pegs in peg board; patient did fairly well with this task. Patient did drop pegs multiple times but was able to self-correct. 3. Putting on shirt with cues to push his right UE with increased amplitude, and to begin with RUE into sleeve and "whip shirt around back" and no assist from PT 5 times Patient struggled the most with being able to bring the shirt across his back and then with placing his right arm initially into the sleeve. Once his fingers were at the top opening of the sleeve he did well with pushing his arm through. . Patient pulled shirt over his head to doff shirt and this method. His watch got stuck today when putting in LUE into sleeve.   He had trouble with his shirt getting tangled up he has learned to rotate his wrist 90 deg to get past the snag of watch catching in the shirt. He had a thinner shirt which was more difficult. 4. Performedbuttons with shirt x 5 with finger flicks once. Patient performed this task in arm chair with back support.  5. Tappingactivity for raising his legs up high for improved dynamic standing balance and getting legs up on his step stool for getting into bed, practicing on first step and second step of stairs x 10 x 2 repetitions without UEs support with one small loss of balance that he was able to self-correct.   Gait Training: Functional mobility without assistive device with BIG arm swing x 361fet x 2  and cues for big arm swing with cues to maintain erect posture and to not lean forward and accelerate when he gets fatigued. Patient required one seated rest break secondary to fatigue.   Daily Homework Activity: turning pages BIG                           PT Education - 07/08/16 1439    Education provided Yes   Education Details LSVT BIG   Person(s) Educated Patient   Methods Explanation   Comprehension Verbalized  understanding             PT Long Term Goals - 06/26/16 1308      PT LONG TERM GOAL #1   Title Patient will be independent in home exercise program to improve strength/mobility for better functional independence with ADLs.   Baseline patient is performing LSVT BIG   Time 4   Period Weeks   Status On-going     PT LONG TERM GOAL #2   Title Patient (> 539years old) will complete five times sit to stand test in < 15 seconds indicating an increased LE strength and improved balance.   Baseline 17.29 sec   Time 4   Period Weeks   Status Partially Met     PT LONG TERM GOAL #3   Title Patient will increase Berg Balance score by > 6 points to demonstrate decreased fall  risk during functional activities.   Baseline 42/56 berg   Time 4   Period Weeks   Status Partially Met     PT LONG TERM GOAL #4   Title Patient will increase six minute walk test distance to >1000 for progression to community ambulator and improve gait ability   Baseline 800 feet   Time 4   Period Weeks   Status Partially Met     PT LONG TERM GOAL #5   Title Patient will increase 10 meter walk test to >1.23ms as to improve gait speed for better community ambulation and to reduce fall risk   Baseline 1.25 m/ sec   Time 4   Period Weeks   Status Partially Met     PT LONG TERM GOAL #7   Title Patient will improve coordination B hands to improve ability to use phone and computer indicated by a decrease of 9 hole peg test of 5 seconds.   Baseline left 34.73 sec, right 27.72   Period Weeks   Status Partially Met               Plan - 07/08/16 1441    Clinical Impression Statement Patient is able to get his coat on correctly and faster with new method and using large amplitude motions. Patient is able to perform his exercises with modeling and verbal correction and cueing to step in the correct position. Patient need cueing for BIG arm swing, Patient needs cueing for BIG swing and adding dual cognitive  loads and distracters.Patient will benefit from continued skilled PT to improve balance, and mobility   Rehab Potential Good   Clinical Impairments Affecting Rehab Potential This patient presents with 2, personal factors/ comorbidities including falls, and current situation., and 4  body elements including body structures and functions, activity limitations and or participation restrictions:  unsteady gait, unsteady transfers, decreased standing balance, decreased gait.  Patient's condition is evolving.   PT Frequency 4x / week   PT Duration 4 weeks   PT Treatment/Interventions Gait training;Stair training;Neuromuscular re-education;Balance training;Therapeutic exercise;Therapeutic activities;Patient/family education   PT Next Visit Plan LSVT BIG   PT Home Exercise Plan LSVT BIG   Consulted and Agree with Plan of Care Patient      Patient will benefit from skilled therapeutic intervention in order to improve the following deficits and impairments:  Abnormal gait, Decreased balance, Decreased endurance, Decreased mobility, Difficulty walking, Cardiopulmonary status limiting activity, Decreased activity tolerance, Decreased safety awareness, Decreased strength, Postural dysfunction, Decreased coordination, Obesity  Visit Diagnosis: Difficulty in walking, not elsewhere classified  Muscle weakness (generalized)     Problem List There are no active problems to display for this patient.  KAlanson Puls PT, DPT [place] 07/08/2016, 3:04 PM  [place] RWakemed NorthSERVICES [address]RO[place] [place] [address]Phone: [phone]  Fax:  [phone] Name: Ian AZIZIMRN: [id number] of Birth: [birth year]

## 2016-07-09 ENCOUNTER — Ambulatory Visit: Payer: Medicare Other | Admitting: Physical Therapy

## 2016-07-09 ENCOUNTER — Encounter: Payer: Self-pay | Admitting: Physical Therapy

## 2016-07-09 DIAGNOSIS — R262 Difficulty in walking, not elsewhere classified: Secondary | ICD-10-CM | POA: Diagnosis not present

## 2016-07-09 DIAGNOSIS — M6281 Muscle weakness (generalized): Secondary | ICD-10-CM

## 2016-07-09 NOTE — Therapy (Signed)
[place] MAIN [facility] SERVICES [address] [place], [place], [address] Phone: [phone]   Fax:  [phone]  Physical Therapy Treatment/ Discharge Summary  Patient Details  Name: Ian Gill MRN: [id number] Date of Birth: [birth year] Referring Provider: Vladimir Crofts  Encounter Date: 07/09/2016      PT End of Session - 07/09/16 1308    Visit Number 17   Number of Visits 17   Date for PT Re-Evaluation 07/14/16   Authorization Type 7/10   PT Start Time 0103   PT Stop Time 0202   PT Time Calculation (min) 59 min   Equipment Utilized During Treatment Gait belt   Activity Tolerance Patient tolerated treatment well;Patient limited by fatigue   Behavior During Therapy [facility] for tasks assessed/performed      Past Medical History:  Diagnosis Date  . Anxiety   . COPD (chronic obstructive pulmonary disease) ([place])   . Depression   . Parkinson's disease ([place])   . Seizures ([place])   . Stroke [facility])     Past Surgical History:  Procedure Laterality Date  . Shoulder surgery    . SPINAL FUSION    . TOTAL KNEE ARTHROPLASTY      There were no vitals filed for this visit.      Subjective Assessment - 07/09/16 1307    Subjective Patient reports that it is easier to get dressed now. Patient states his back does not hurt today. Patient states he has not been using his walker this week.He says that he is walking better and not running into things.   Pertinent History Patient is having falls that started 2 years ago. Patient moved into [place] apartments in August 2018. He began using the rollator 1 year ago. He has had falls and stumbles and walks too fast and runs into things. He has episods of stumbling 3- 4 times a week.    Limitations Standing;Walking   How long can you stand comfortably? 10 minutes   How long can you walk comfortably? 10 minutes   Patient Stated Goals to walk without getting fatigued .    Currently in Pain? No/denies    Pain Score 0-No pain   Pain Onset More than a month ago   Multiple Pain Sites No      Treatment: Patient seen for LSVT Daily Session Maximal Daily Exercises for facilitation/coordination of movement Maximum Sustained Movements are designed to rescale the amplitude of movement output for generalization to daily functional activities. Performed as follows for 1 set of 10 repetitions each multidirectional sustained movements: 1) Floor to ceiling - patient needed occasional modeling cue to reach forward 2) Side to side multidirectional movementperformed insitting-modeling and tactile for left forearm supination and to stretch out LE 3) Step and reach forward- patient needed modeling cue to reach out to the side and not forward 4) Step and reach backwards-patient was very limited in the amount of forward trunk flexion and with bringing his arm back into extension, and raised his toe 50% of the time, needs cues for start position 5) Step and reach sideways-modeling for supination of forearm and for initial sequencing but then patient was able to perform without further cuing, needs cues to turn his head 6) Rock and reach forward/backward-patient needed modeling with this activity. Patient needs cues to alternate his UE swinging movements  7) Rock and reach sideways-Patient needed modeling and cuing for head turn and for supination of forearm especially on the left. He needs cues  to finish this exercise. Patient is mixing up his UE movemets during standing exericises and needs cues to perform the exercises correctly  Patient needed several short, sitting rest breaks during above exercises secondary to fatigue. Patient did well with counting out loud for exercise reps. His voice was clear and audible. Patient is trying to perform exercises by memory and not look at his written hand out. He has trouble with hand position for side stepping and fwd stepping and back stepping and needs verbal  cuing  Patient met his goals for therapy and will be DCed to HEP.                           PT Education - 07/09/16 1308    Education provided Yes   Education Details LSVT BIG   Person(s) Educated Patient   Methods Explanation;Demonstration;Verbal cues   Comprehension Verbalized understanding;Returned demonstration;Verbal cues required             PT Long Term Goals - 07/09/16 1315      PT LONG TERM GOAL #1   Title Patient will be independent in home exercise program to improve strength/mobility for better functional independence with ADLs.   Baseline patient is performing LSVT BIG   Time 4   Period Weeks   Status Achieved     PT LONG TERM GOAL #2   Title Patient (> 39 years old) will complete five times sit to stand test in < 15 seconds indicating an increased LE strength and improved balance.   Baseline 17.29 sec, 12.16   Time 4   Period Weeks   Status Achieved     PT LONG TERM GOAL #3   Title Patient will increase Berg Balance score by > 6 points to demonstrate decreased fall risk during functional activities.   Baseline 42/56 berg,48/56   Time 4   Period Weeks   Status Achieved     PT LONG TERM GOAL #4   Title Patient will increase six minute walk test distance to >1000 for progression to community ambulator and improve gait ability   Baseline 800 feet, 1140 feet   Time 4   Period Weeks   Status Achieved     PT LONG TERM GOAL #5   Title Patient will increase 10 meter walk test to >1.31m/s as to improve gait speed for better community ambulation and to reduce fall risk   Baseline 1.25 m/ sec,1.32 msec   Time 4   Period Weeks   Status Achieved     PT LONG TERM GOAL #7   Title Patient will improve coordination B hands to improve ability to use phone and computer indicated by a decrease of 9 hole peg test of 5 seconds.   Baseline left 34.73 sec, right 27.72, left 26.19     right 24.25   Period Weeks   Status Achieved                Plan - 07/09/16 1309    Clinical Impression Statement Min cueing needed to appropriately perform LSVT tasks with leg, hand, and head position. Decreased coordination demonstrated requiring consistent verbal cueing to correct form. Patient continues to demonstrate some in coordination of movement with select exercises such as rock and reach and stepping and was given written documentation to correct this.He will be DC from PT to HEP.    Rehab Potential Good   Clinical Impairments Affecting Rehab Potential This patient presents with 2,  personal factors/ comorbidities including falls, and current situation., and 4  body elements including body structures and functions, activity limitations and or participation restrictions:  unsteady gait, unsteady transfers, decreased standing balance, decreased gait.  Patient's condition is evolving.   PT Frequency 4x / week   PT Duration 4 weeks   PT Treatment/Interventions Gait training;Stair training;Neuromuscular re-education;Balance training;Therapeutic exercise;Therapeutic activities;Patient/family education   PT Next Visit Plan LSVT BIG   PT Home Exercise Plan LSVT BIG   Consulted and Agree with Plan of Care Patient      Patient will benefit from skilled therapeutic intervention in order to improve the following deficits and impairments:  Abnormal gait, Decreased balance, Decreased endurance, Decreased mobility, Difficulty walking, Cardiopulmonary status limiting activity, Decreased activity tolerance, Decreased safety awareness, Decreased strength, Postural dysfunction, Decreased coordination, Obesity  Visit Diagnosis: Difficulty in walking, not elsewhere classified  Muscle weakness (generalized)     Problem List There are no active problems to display for this patient.  Alanson Puls, PT, DPT [place], [place] S 07/09/2016, 2:09 PM  [place] MAIN [facility] SERVICES [address] [place],  [place], [address] Phone: [phone]   Fax:  [phone]  Name: Ian Gill MRN: [id number] Date of Birth: [birth year]

## 2019-05-30 ENCOUNTER — Other Ambulatory Visit: Payer: Self-pay

## 2019-05-30 ENCOUNTER — Emergency Department: Payer: Medicare Other

## 2019-05-30 ENCOUNTER — Inpatient Hospital Stay
Admission: EM | Admit: 2019-05-30 | Discharge: 2019-06-04 | DRG: 101 | Disposition: A | Discharge status: Home Health | Payer: Medicare Other | Attending: Internal Medicine | Admitting: Internal Medicine

## 2019-05-30 DIAGNOSIS — Z789 Other specified health status: Secondary | ICD-10-CM

## 2019-05-30 DIAGNOSIS — Z9114 Patient's other noncompliance with medication regimen: Secondary | ICD-10-CM

## 2019-05-30 DIAGNOSIS — Z7289 Other problems related to lifestyle: Secondary | ICD-10-CM

## 2019-05-30 DIAGNOSIS — Z888 Allergy status to other drugs, medicaments and biological substances status: Secondary | ICD-10-CM

## 2019-05-30 DIAGNOSIS — R569 Unspecified convulsions: Secondary | ICD-10-CM | POA: Diagnosis not present

## 2019-05-30 DIAGNOSIS — Z7902 Long term (current) use of antithrombotics/antiplatelets: Secondary | ICD-10-CM

## 2019-05-30 DIAGNOSIS — G40409 Other generalized epilepsy and epileptic syndromes, not intractable, without status epilepticus: Secondary | ICD-10-CM | POA: Diagnosis not present

## 2019-05-30 DIAGNOSIS — Z79890 Hormone replacement therapy (postmenopausal): Secondary | ICD-10-CM

## 2019-05-30 DIAGNOSIS — G20 Parkinson's disease: Secondary | ICD-10-CM | POA: Diagnosis present

## 2019-05-30 DIAGNOSIS — Z981 Arthrodesis status: Secondary | ICD-10-CM

## 2019-05-30 DIAGNOSIS — Z20822 Contact with and (suspected) exposure to covid-19: Secondary | ICD-10-CM | POA: Diagnosis present

## 2019-05-30 DIAGNOSIS — Z79899 Other long term (current) drug therapy: Secondary | ICD-10-CM

## 2019-05-30 DIAGNOSIS — Z825 Family history of asthma and other chronic lower respiratory diseases: Secondary | ICD-10-CM

## 2019-05-30 DIAGNOSIS — F419 Anxiety disorder, unspecified: Secondary | ICD-10-CM | POA: Diagnosis present

## 2019-05-30 DIAGNOSIS — R4182 Altered mental status, unspecified: Secondary | ICD-10-CM

## 2019-05-30 DIAGNOSIS — F329 Major depressive disorder, single episode, unspecified: Secondary | ICD-10-CM | POA: Diagnosis present

## 2019-05-30 DIAGNOSIS — I10 Essential (primary) hypertension: Secondary | ICD-10-CM | POA: Diagnosis present

## 2019-05-30 DIAGNOSIS — Z8673 Personal history of transient ischemic attack (TIA), and cerebral infarction without residual deficits: Secondary | ICD-10-CM

## 2019-05-30 DIAGNOSIS — J449 Chronic obstructive pulmonary disease, unspecified: Secondary | ICD-10-CM | POA: Diagnosis present

## 2019-05-30 DIAGNOSIS — Z87891 Personal history of nicotine dependence: Secondary | ICD-10-CM

## 2019-05-30 DIAGNOSIS — F1090 Alcohol use, unspecified, uncomplicated: Secondary | ICD-10-CM

## 2019-05-30 LAB — COMPREHENSIVE METABOLIC PANEL
ALT: 27 U/L (ref 0–44)
AST: 62 U/L — ABNORMAL HIGH (ref 15–41)
Albumin: 5.1 g/dL — ABNORMAL HIGH (ref 3.5–5.0)
Alkaline Phosphatase: 49 U/L (ref 38–126)
Anion gap: 11 (ref 5–15)
BUN: 15 mg/dL (ref 8–23)
CO2: 24 mmol/L (ref 22–32)
Calcium: 9.7 mg/dL (ref 8.9–10.3)
Chloride: 100 mmol/L (ref 98–111)
Creatinine, Ser: 0.82 mg/dL (ref 0.61–1.24)
GFR calc Af Amer: >60 mL/min (ref >60)
GFR calc non Af Amer: >60 mL/min (ref >60)
Glucose, Bld: 130 mg/dL — ABNORMAL HIGH (ref 70–99)
Potassium: 3.8 mmol/L (ref 3.5–5.1)
Sodium: 135 mmol/L (ref 135–145)
Total Bilirubin: 0.9 mg/dL (ref 0.3–1.2)
Total Protein: 7.9 g/dL (ref 6.5–8.1)

## 2019-05-30 LAB — RESPIRATORY PANEL BY RT PCR (FLU A&B, COVID)
Influenza A by PCR: NEGATIVE
Influenza B by PCR: NEGATIVE
SARS Coronavirus 2 by RT PCR: NEGATIVE

## 2019-05-30 LAB — CBC WITH DIFFERENTIAL/PLATELET
Abs Immature Granulocytes: 0.05 10*3/uL (ref 0.00–0.07)
Basophils Absolute: 0 10*3/uL (ref 0.0–0.1)
Basophils Relative: 0 %
Eosinophils Absolute: 0 10*3/uL (ref 0.0–0.5)
Eosinophils Relative: 0 %
HCT: 47.2 % (ref 39.0–52.0)
Hemoglobin: 17.6 g/dL — ABNORMAL HIGH (ref 13.0–17.0)
Immature Granulocytes: 0 %
Lymphocytes Relative: 5 %
Lymphs Abs: 0.7 10*3/uL (ref 0.7–4.0)
MCH: 34.3 pg — ABNORMAL HIGH (ref 26.0–34.0)
MCHC: 37.3 g/dL — ABNORMAL HIGH (ref 30.0–36.0)
MCV: 92 fL (ref 80.0–100.0)
Monocytes Absolute: 1 10*3/uL (ref 0.1–1.0)
Monocytes Relative: 8 %
Neutro Abs: 11.3 10*3/uL — ABNORMAL HIGH (ref 1.7–7.7)
Neutrophils Relative %: 87 %
Platelets: 173 10*3/uL (ref 150–400)
RBC: 5.13 MIL/uL (ref 4.22–5.81)
RDW: 11.5 % (ref 11.5–15.5)
WBC: 13.1 10*3/uL — ABNORMAL HIGH (ref 4.0–10.5)
nRBC: 0 % (ref 0.0–0.2)

## 2019-05-30 LAB — GLUCOSE, CAPILLARY: Glucose-Capillary: 123 mg/dL — ABNORMAL HIGH (ref 70–99)

## 2019-05-30 LAB — BRAIN NATRIURETIC PEPTIDE: B Natriuretic Peptide: 98 pg/mL (ref 0.0–100.0)

## 2019-05-30 LAB — TROPONIN I (HIGH SENSITIVITY)
Troponin I (High Sensitivity): 18 ng/L — ABNORMAL HIGH (ref <18)
Troponin I (High Sensitivity): 20 ng/L — ABNORMAL HIGH (ref <18)

## 2019-05-30 LAB — ETHANOL
Alcohol, Ethyl (B): <10 mg/dL (ref <10)
Alcohol, Ethyl (B): <10 mg/dL (ref <10)

## 2019-05-30 LAB — MRSA PCR SCREENING: MRSA by PCR: NEGATIVE

## 2019-05-30 IMAGING — DX DG CHEST 1V PORT
1 series · 1 of 1 positions shown · non-contrast
Comparison: [DATE]

CLINICAL DATA: Syncope

EXAM:
PORTABLE CHEST 1 VIEW

[chest ap]
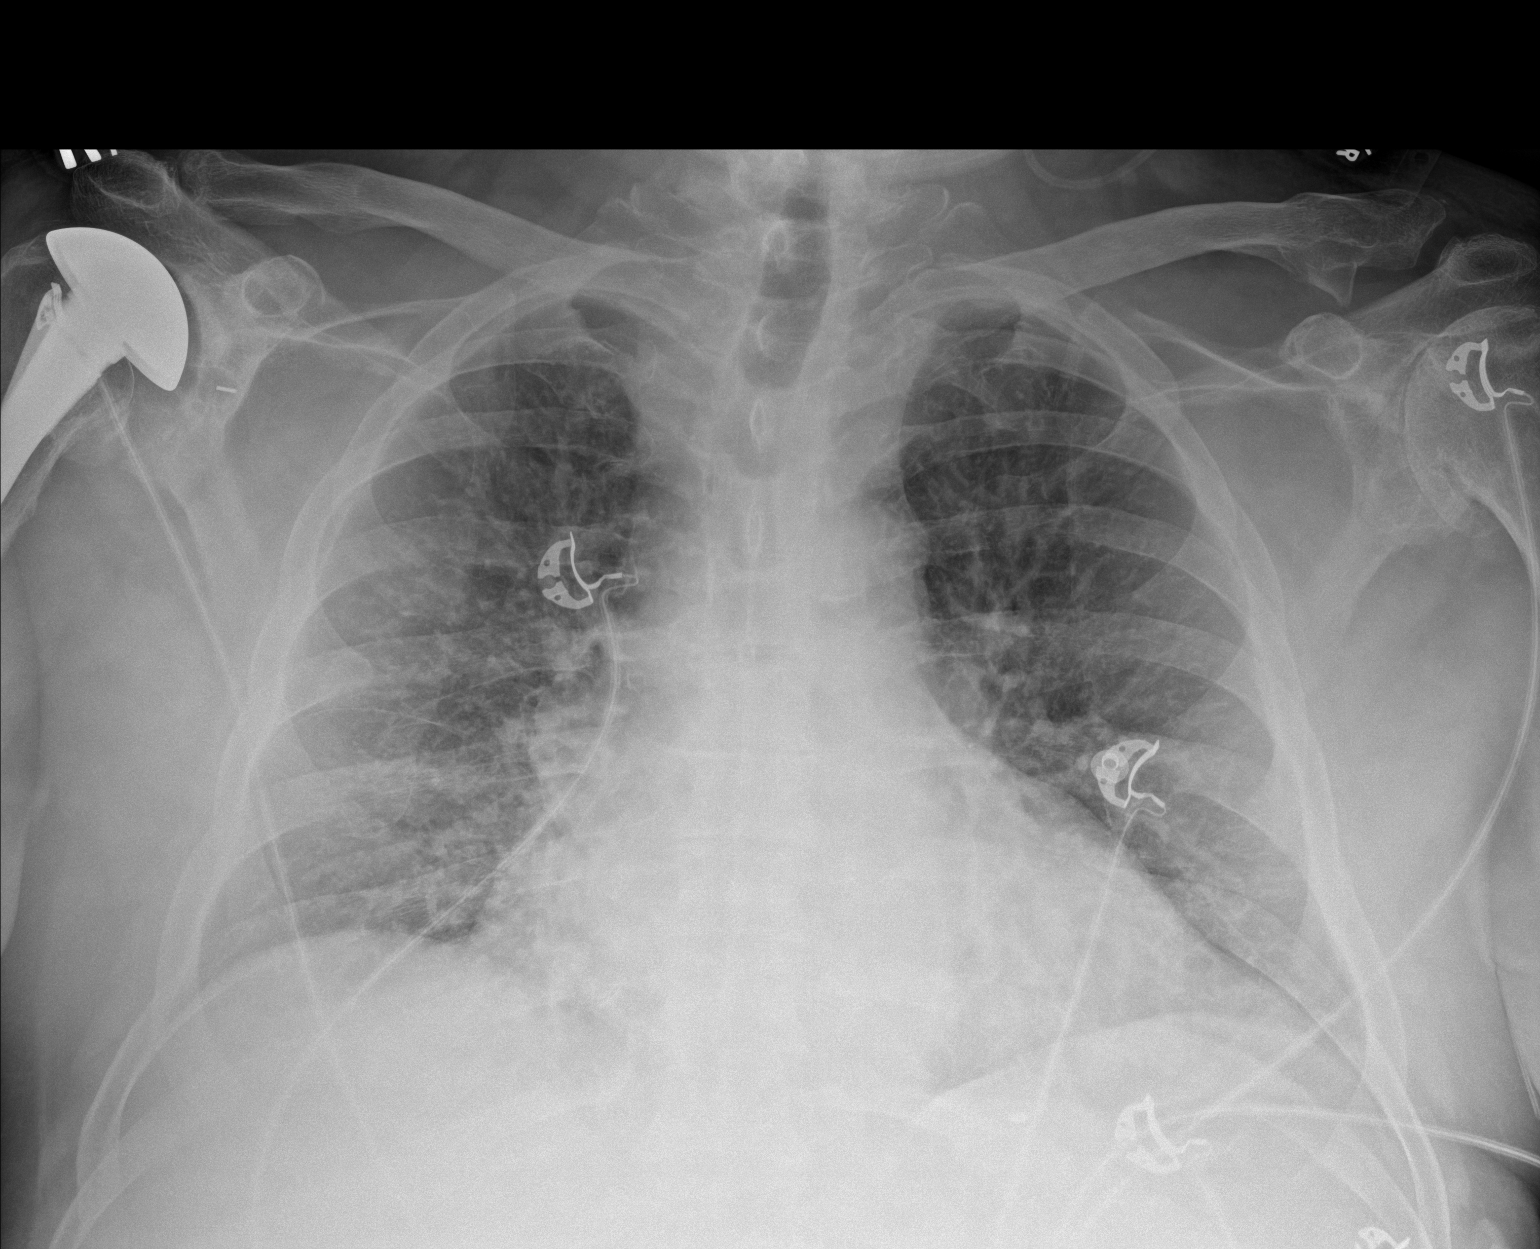

[1 of 1 positions shown; findings below may reference images not displayed]

FINDINGS: There is mild interstitial thickening. There is no frank edema or
consolidation. Heart is upper normal in size with pulmonary
vascularity normal. No adenopathy. There is a total shoulder
replacement on the right. There is degenerative change in the left
shoulder. There is postoperative change in the mid to lower cervical
spine.
IMPRESSION: Interstitial thickening without edema or consolidation. Question a
degree of underlying inflammatory change/fibrosis. Heart upper
normal in size. No adenopathy appreciable.

## 2019-05-30 IMAGING — CT CT HEAD W/O CM
3 series · 16 of 47 positions shown, 19 images · non-contrast
Comparison: Head CT [DATE]

CLINICAL DATA: Seizure, nontraumatic. Additional history provided:
Seizure with fall today, history of seizures.

EXAM:
CT HEAD WITHOUT CONTRAST
TECHNIQUE: Contiguous axial images were obtained from the base of the skull
through the vertex without intravenous contrast.

[Series 2: head wo · axial · 0.47mm/px · z∈[-148,-8]mm · 10 of 34 slices shown, 13 images]
[im 3/34  brain]
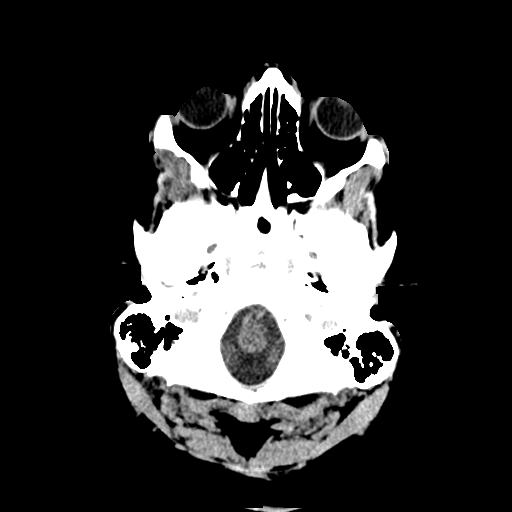
[im 3/34  bone]
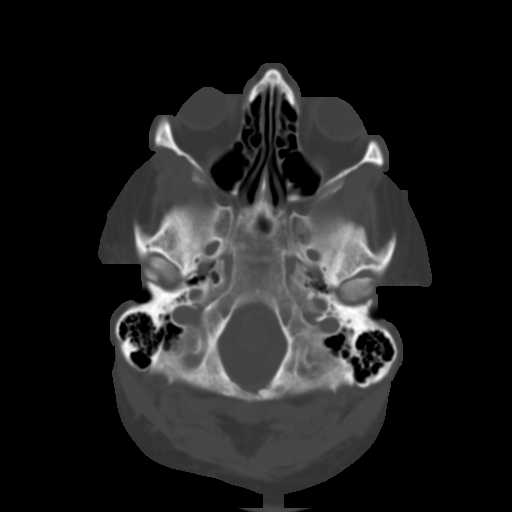
[im 6/34  brain]
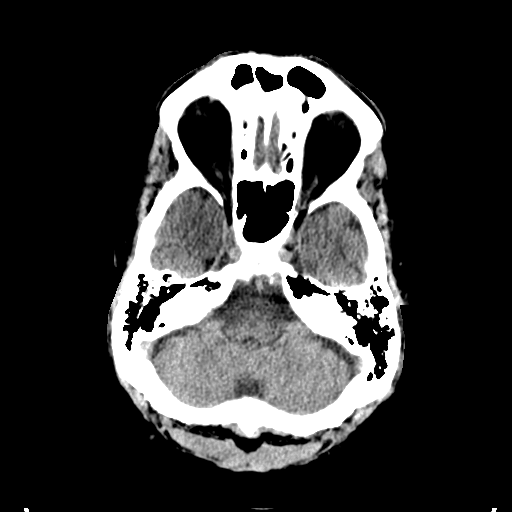
[im 10/34  brain]
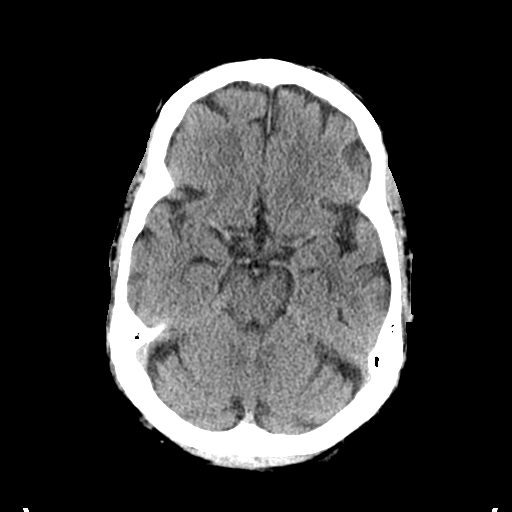
[im 12/34  brain]
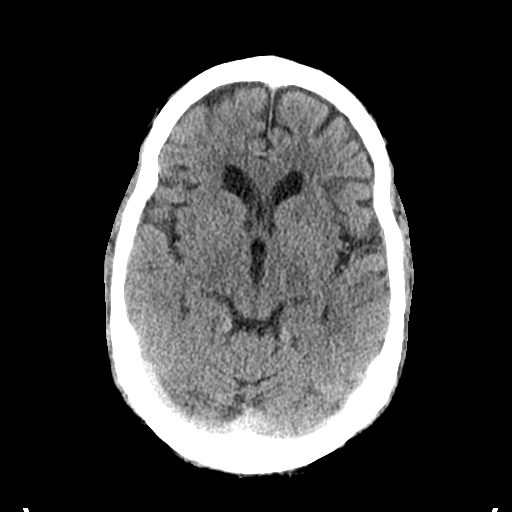
[im 15/34  brain]
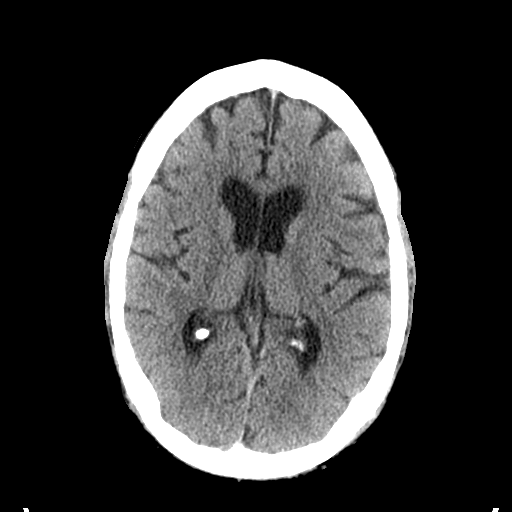
[im 15/34  bone]
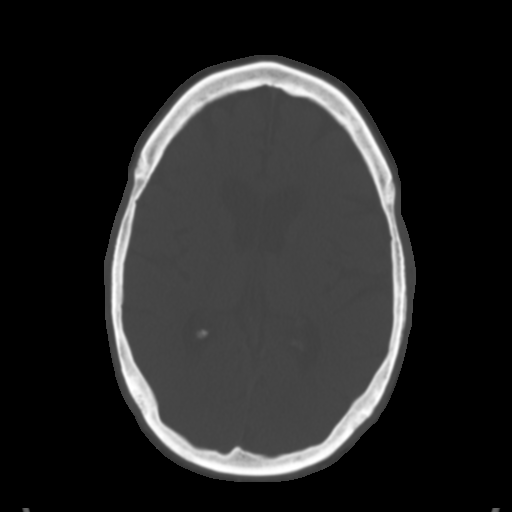
[im 19/34  brain]
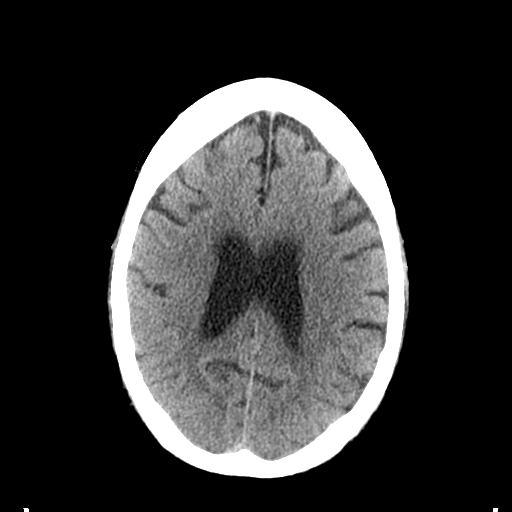
[im 22/34  brain]
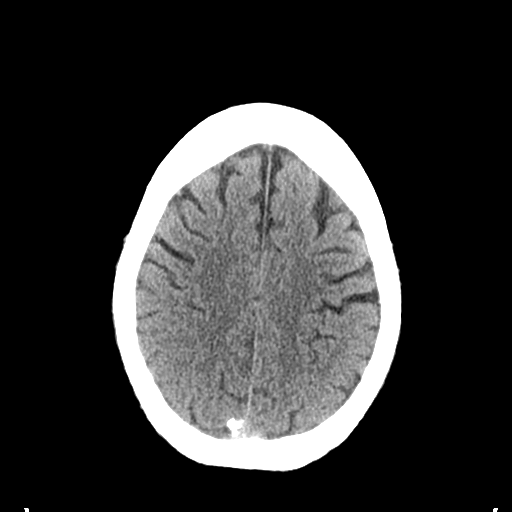
[im 26/34  brain]
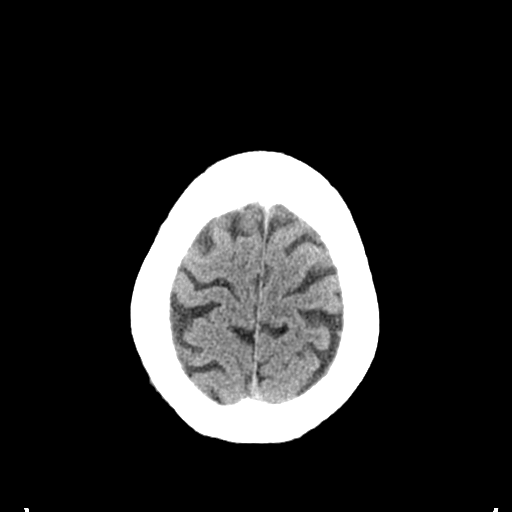
[im 28/34  brain]
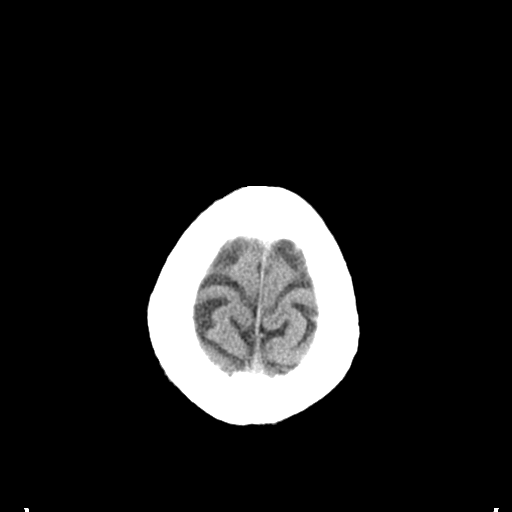
[im 28/34  bone]
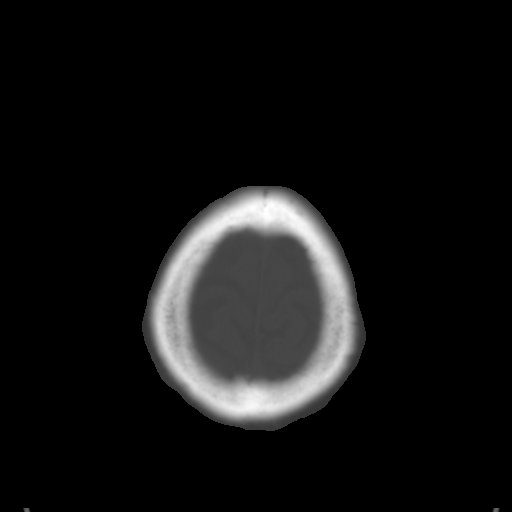
[im 31/34  brain]
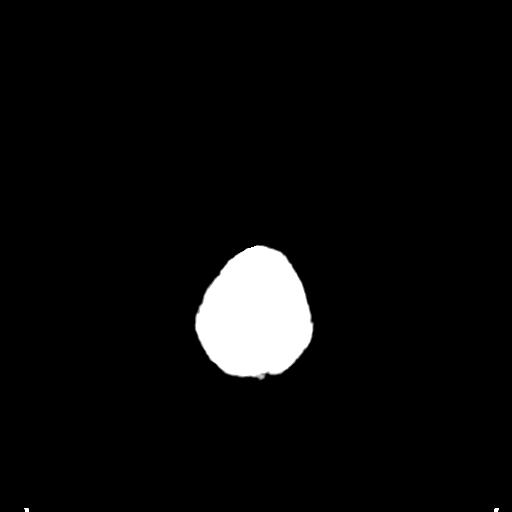

[Series 4: coronal soft tissue · coronal · 0.34mm/px · 3 of 71 slices shown]
[im 24/71  brain]
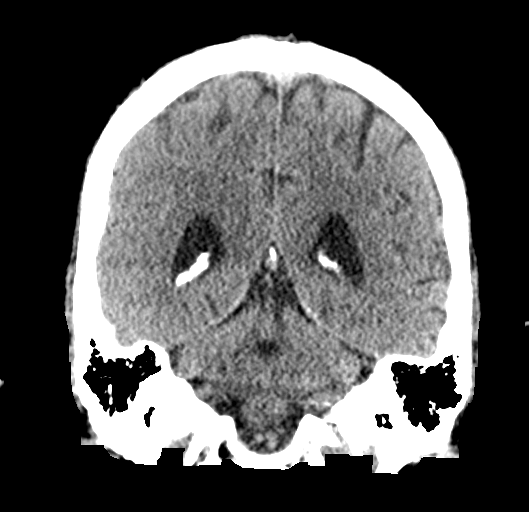
[im 32/71  brain]
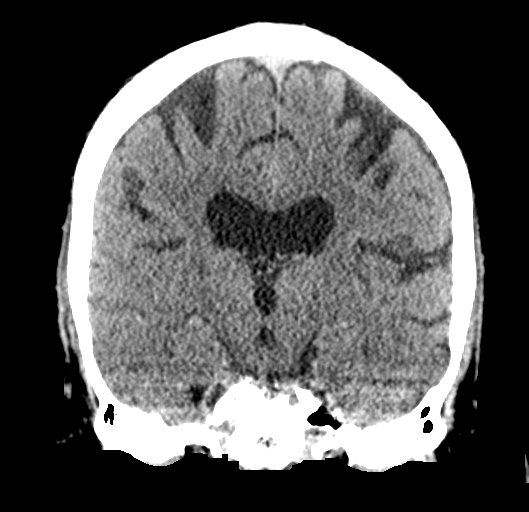
[im 39/71  brain]
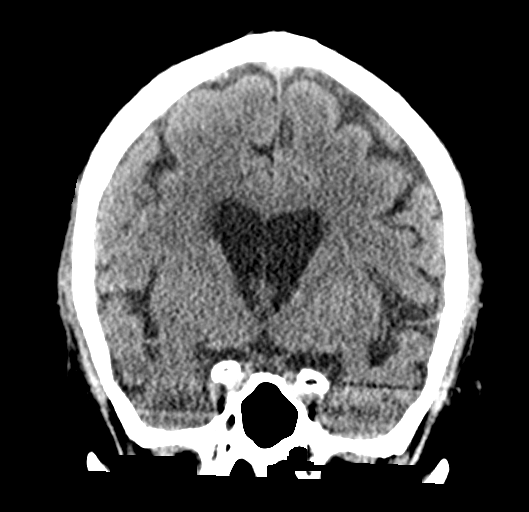

[Series 5: sagittal soft tissue · sagittal · 0.34mm/px · 3 of 56 slices shown]
[im 19/56  brain]
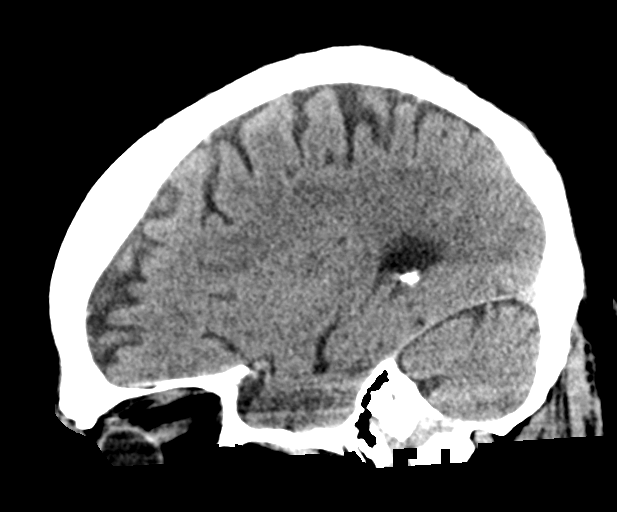
[im 28/56  brain]
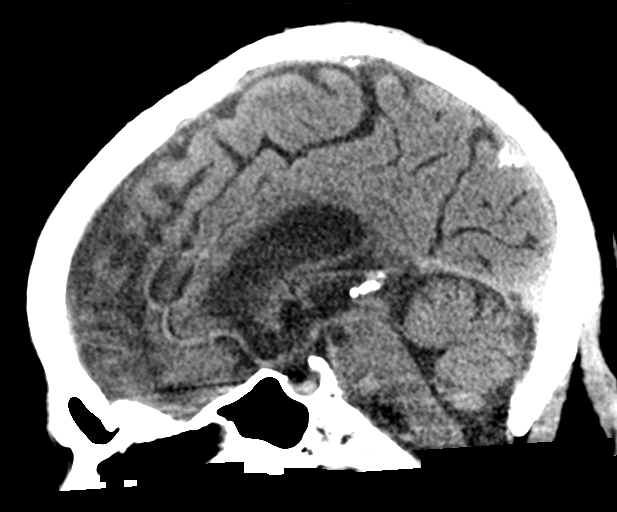
[im 37/56  brain]
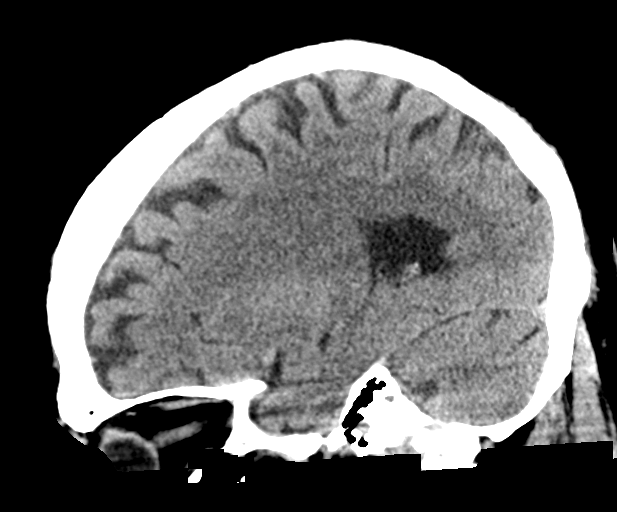

[16 of 47 positions shown; findings below may reference images not displayed]

FINDINGS: Brain:

Stable, mild generalized parenchymal atrophy.

There is no acute intracranial hemorrhage.

No demarcated cortical infarct.

No extra-axial fluid collection.

No evidence of intracranial mass.

No midline shift.

Vascular: No hyperdense vessel.  Atherosclerotic calcifications.

Skull: Normal. Negative for fracture or focal lesion.

Sinuses/Orbits: Visualized orbits show no acute finding. Mild
ethmoid and sphenoid sinus mucosal thickening at the imaged levels.
No significant mastoid effusion.
IMPRESSION: No evidence of acute intracranial abnormality.

Stable, mild generalized parenchymal atrophy.

Mild ethmoid and sphenoid sinus mucosal thickening at the imaged
levels.

## 2019-05-30 MED ORDER — LEVETIRACETAM IN NACL 500 MG/100ML IV SOLN
500.0000 mg | Freq: Two times a day (BID) | INTRAVENOUS | Status: DC
  Administered 2019-05-31: 03:00:00 500 mg via INTRAVENOUS
  Filled 2019-05-30 (×2): qty 100

## 2019-05-30 MED ORDER — SODIUM CHLORIDE 0.9 % IV SOLN
75.0000 mL/h | INTRAVENOUS | Status: DC
  Administered 2019-05-31 – 2019-06-02 (×3): 75 mL/h via INTRAVENOUS

## 2019-05-30 MED ORDER — LORAZEPAM 2 MG/ML IJ SOLN
1.0000 mg | INTRAMUSCULAR | Status: DC | PRN
  Administered 2019-05-30 – 2019-05-31 (×2): 2 mg via INTRAVENOUS
  Filled 2019-05-30 (×2): qty 1

## 2019-05-30 MED ORDER — LORAZEPAM 2 MG PO TABS: 0.0000 mg | ORAL_TABLET | Freq: Two times a day (BID) | ORAL | Status: AC

## 2019-05-30 MED ORDER — LABETALOL HCL 5 MG/ML IV SOLN
10.0000 mg | INTRAVENOUS | Status: DC | PRN
  Administered 2019-06-01 – 2019-06-02 (×5): 10 mg via INTRAVENOUS
  Filled 2019-05-30 (×6): qty 4

## 2019-05-30 MED ORDER — SODIUM CHLORIDE 0.9 % IV SOLN: Freq: Once | INTRAVENOUS | Status: AC

## 2019-05-30 MED ORDER — LORAZEPAM 2 MG/ML IJ SOLN
0.0000 mg | Freq: Two times a day (BID) | INTRAMUSCULAR | Status: AC
  Administered 2019-06-02: 01:00:00 2 mg via INTRAVENOUS
  Filled 2019-05-30: qty 1

## 2019-05-30 MED ORDER — THIAMINE HCL 100 MG PO TABS
100.0000 mg | ORAL_TABLET | Freq: Every day | ORAL | Status: DC
  Administered 2019-06-02 – 2019-06-04 (×3): 100 mg via ORAL
  Filled 2019-05-30 (×3): qty 1

## 2019-05-30 MED ORDER — ACETAMINOPHEN 325 MG PO TABS: 650.0000 mg | ORAL_TABLET | ORAL | Status: DC | PRN

## 2019-05-30 MED ORDER — THIAMINE HCL 100 MG/ML IJ SOLN
100.0000 mg | Freq: Every day | INTRAMUSCULAR | Status: DC
  Administered 2019-05-31 – 2019-06-01 (×2): 100 mg via INTRAVENOUS
  Filled 2019-05-30 (×2): qty 2

## 2019-05-30 MED ORDER — LORAZEPAM 2 MG/ML IJ SOLN
0.0000 mg | Freq: Four times a day (QID) | INTRAMUSCULAR | Status: AC
  Administered 2019-05-30 – 2019-06-01 (×3): 2 mg via INTRAVENOUS
  Administered 2019-06-01: 1 mg via INTRAVENOUS
  Filled 2019-05-30 (×5): qty 1

## 2019-05-30 MED ORDER — CLOPIDOGREL BISULFATE 75 MG PO TABS
75.0000 mg | ORAL_TABLET | Freq: Every day | ORAL | Status: DC
  Administered 2019-06-02 – 2019-06-04 (×3): 75 mg via ORAL
  Filled 2019-05-30 (×3): qty 1

## 2019-05-30 MED ORDER — LORAZEPAM 2 MG PO TABS: 0.0000 mg | ORAL_TABLET | Freq: Four times a day (QID) | ORAL | Status: AC

## 2019-05-30 MED ORDER — LEVETIRACETAM IN NACL 1000 MG/100ML IV SOLN
1000.0000 mg | Freq: Once | INTRAVENOUS | Status: AC
  Administered 2019-05-30: 15:00:00 1000 mg via INTRAVENOUS
  Filled 2019-05-30: qty 100

## 2019-05-30 MED ORDER — ACETAMINOPHEN 650 MG RE SUPP
650.0000 mg | RECTAL | Status: DC | PRN
  Administered 2019-05-30 – 2019-05-31 (×2): 650 mg via RECTAL
  Filled 2019-05-30 (×2): qty 1

## 2019-05-30 NOTE — ED Notes (Signed)
Attempted report. Name and number given.

## 2019-05-30 NOTE — ED Notes (Addendum)
Pt's son updated as pt remains somewhat confused. Son states he is coming to bedside soon. EDP Roxan Hockey at bedside. Son states pt takes Keppra and another med he was told helps with seizures but that his supply ran out recently and no one would provide a refill on it.

## 2019-05-30 NOTE — ED Provider Notes (Signed)
[facility] Emergency Department Provider Note    First MD Initiated Contact with Patient 05/30/19 1406     (approximate)  I have reviewed the triage vital signs and the nursing notes.   HISTORY  Chief Complaint Seizures  Level v caveat:  AMS - postictal  HPI Ian Gill is a 74 y.o. male bolus past medical history presents to the ER for evaluation of seizure-like activity.  Coming from [place] police.  Found after sounds to be seizure-like activity found post ictal.  Patient currently denies any pain but does still seem somewhat confused.  Poorly has been compliant with his medications.    Past Medical History:  Diagnosis Date  . Anxiety   . COPD (chronic obstructive pulmonary disease) ([place])   . Depression   . Parkinson's disease ([place])   . Seizures ([place])   . Stroke [facility])    No family history on file. Past Surgical History:  Procedure Laterality Date  . Shoulder surgery    . SPINAL FUSION    . TOTAL KNEE ARTHROPLASTY     There are no problems to display for this patient.     Prior to Admission medications   Medication Sig Start Date End Date Taking? Authorizing Provider  atenolol (TENORMIN) 50 MG tablet Take 1 tablet (50 mg total) by mouth daily. 07/11/15   Carrie Mew, MD  atorvastatin (LIPITOR) 20 MG tablet Take 1 tablet (20 mg total) by mouth daily. 07/11/15   Carrie Mew, MD  clopidogrel (PLAVIX) 75 MG tablet Take 1 tablet (75 mg total) by mouth daily. 07/11/15   Carrie Mew, MD  divalproex (DEPAKOTE) 250 MG DR tablet Take 3-4 tablets (750-1,000 mg total) by mouth 2 (two) times daily. 3 tablets in the morning and 4 tablets at night. 07/11/15   Carrie Mew, MD  levETIRAcetam (KEPPRA) 500 MG tablet Take 3 tablets (1,500 mg total) by mouth 2 (two) times daily. 07/11/15   Carrie Mew, MD  lisinopril (PRINIVIL,ZESTRIL) 30 MG tablet Take 1 tablet (30 mg total) by mouth daily. 07/11/15   Carrie Mew, MD    Melatonin 10 MG TABS Take 1 tablet by mouth at bedtime. 07/11/15   Carrie Mew, MD  Omega 3 1000 MG CAPS Take 1 capsule (1,000 mg total) by mouth daily. 07/11/15   Carrie Mew, MD  ranitidine (ZANTAC) 150 MG tablet Take 1 tablet (150 mg total) by mouth daily. 07/11/15   Carrie Mew, MD  sertraline (ZOLOFT) 100 MG tablet Take 1.5 tablets (150 mg total) by mouth daily. 07/11/15   Carrie Mew, MD  traMADol (ULTRAM) 50 MG tablet Take 1 tablet by mouth daily. 07/10/15   [provider]  Vitamin D, Cholecalciferol, 1000 units TABS Take 1 tablet by mouth daily. 07/11/15   Carrie Mew, MD    Allergies Dilantin [phenytoin sodium extended] and Wellbutrin [bupropion]    Social History Social History   Tobacco Use  . Smoking status: Former Research scientist (life sciences)  . Smokeless tobacco: Never Used  Substance Use Topics  . Alcohol use: No  . Drug use: No    Review of Systems Patient denies headaches, rhinorrhea, blurry vision, numbness, shortness of breath, chest pain, edema, cough, abdominal pain, nausea, vomiting, diarrhea, dysuria, fevers, rashes or hallucinations unless otherwise stated above in HPI. ____________________________________________   PHYSICAL EXAM:  VITAL SIGNS: Vitals:   05/30/19 1513 05/30/19 1519  BP:    Pulse:  95  Resp: 17   Temp:    SpO2:  95%  Constitutional: Alert, drowsy and postictal appearing Eyes: Conjunctivae are normal.  Head: Atraumatic. Nose: No congestion/rhinnorhea. Mouth/Throat: Mucous membranes are moist.   Neck: No stridor. Painless ROM.  Cardiovascular: Normal rate, regular rhythm. Grossly normal heart sounds.  Good peripheral circulation. Respiratory: Normal respiratory effort.  No retractions. Lungs CTAB. Gastrointestinal: Soft and nontender. No distention. No abdominal bruits. No CVA tenderness. Genitourinary: deferred  Musculoskeletal: No lower extremity tenderness nor edema.  No joint effusions. Neurologic:  BUE  resting tremor, trace tongue fasciculations, slow but appropriate speech and language.CNI, MAE/ No gross focal neurologic deficits are appreciated. No facial droop Skin:  Skin is warm, dry and intact. No rash noted. Psychiatric: Mood and affect are normal. Speech and behavior are normal.  ____________________________________________   LABS (all labs ordered are listed, but only abnormal results are displayed)  No results found for this or any previous visit (from the past 24 hour(s)). ____________________________________________  EKG My review and personal interpretation at Time: 13:56   Indication: sz like activity  Rate: 85  Rhythm: sinus with occasional pac Axis: normal Other: nonspecific st abn, no stemi ____________________________________________  RADIOLOGY  I personally reviewed all radiographic images ordered to evaluate for the above acute complaints and reviewed radiology reports and findings.  These findings were personally discussed with the patient.  Please see medical record for radiology report.  ____________________________________________   PROCEDURES  Procedure(s) performed:  Procedures    Critical Care performed: no ____________________________________________   INITIAL IMPRESSION / ASSESSMENT AND PLAN / ED COURSE  Pertinent labs & imaging results that were available during my care of the patient were reviewed by me and considered in my medical decision making (see chart for details).   DDX: Seizure, medication reaction, dysrhythmia, anemia, sepsis, bleed, mass  COBIE MARCOUX is a 74 y.o. who presents to the ED with presentation as described above.  Patient appears somewhat postictal.  No lateralizing deficits.  Have a lower suspicion for CVA.  CT imaging was ordered to evaluate for traumatic injury or bleed shows no acute abnormality.  Chest x-ray with some interstitial prominence.  Initially was hypoxic may have been related to postictal state as  after observation in the ER he is no longer requiring supplemental oxygen.  Has been a delay in obtaining blood work.  He was given extra dose of Keppra due to concern for seizure.  Son now at bedside states that he does drink alcohol daily.  Will place him on [place] protocol.  Do anticipate patient will require hospitalization for further work-up.     The patient was evaluated in Emergency Department today for the symptoms described in the history of present illness. He/she was evaluated in the context of the global COVID-19 pandemic, which necessitated consideration that the patient might be at risk for infection with the SARS-CoV-2 virus that causes COVID-19. Institutional protocols and algorithms that pertain to the evaluation of patients at risk for COVID-19 are in a state of rapid change based on information released by regulatory bodies including the CDC and federal and state organizations. These policies and algorithms were followed during the patient's care in the ED.  As part of my medical decision making, I reviewed the following data within the electronic MEDICAL RECORD NUMBER Nursing notes reviewed and incorporated, Labs reviewed, notes from prior ED visits and [place] Controlled Substance Database   ____________________________________________   FINAL CLINICAL IMPRESSION(S) / ED DIAGNOSES  Final diagnoses:  Altered mental status, unspecified altered mental status type  Seizure (HCC)  NEW MEDICATIONS STARTED DURING THIS VISIT:  New Prescriptions   No medications on file     Note:  This document was prepared using Dragon voice recognition software and may include unintentional dictation errors.    Willy Eddy, MD 05/30/19 [phone]

## 2019-05-30 NOTE — Progress Notes (Addendum)
Patient noted to have a Seizure during ADL Care for approx. 1 min. PRN Ativan administered without incident. 2L O2 placed on patient & suction kit at beside. Will continue to monitor.

## 2019-05-30 NOTE — Progress Notes (Signed)
MEDICATION RELATED CONSULT NOTE  Pharmacy Consult for drug interaction and monitoring of AEDs Indication: seizures   Allergies  Allergen Reactions  . Dilantin [Phenytoin Sodium Extended]     Past issues with toxicity per pt's son  . Wellbutrin [Bupropion] Other (See Comments)    Seizures     Patient Measurements: Height: 6' (182.9 cm) Weight: 106 kg (233 lb 11 oz) IBW/kg (Calculated) : 77.6  Vital Signs: Temp: 98.1 F (36.7 C) (05/03 1357) Temp Source: Oral (05/03 1357) BP: 178/75 (05/03 1830) Pulse Rate: 72 (05/03 1819) Intake/Output from previous day: No intake/output data recorded. Intake/Output from this shift: No intake/output data recorded.  Labs: Recent Labs    05/30/19 1425  WBC 13.1*  HGB 17.6*  HCT 47.2  PLT 173  CREATININE 0.82  ALBUMIN 5.1*  PROT 7.9  AST 62*  ALT 27  ALKPHOS 49  BILITOT 0.9   Estimated Creatinine Clearance: 98 mL/min (by C-G formula based on SCr of 0.82 mg/dL).    Assessment: 74 yo male here with seizure. Pharmacy consulted for drug interaction and monitoring of AEDs   Plan:  Pt currently only ordered levetiracetam at this time. Will continue to follow and monitor.   Marty Heck 05/30/2019,6:57 PM

## 2019-05-30 NOTE — ED Notes (Signed)
Brittney, NT at bedside to collect covid swab.

## 2019-05-30 NOTE — ED Notes (Signed)
COPD history listed in chart so pt's O2 switched to 3L. Sat 96% currently. Pt more responsive currently.

## 2019-05-30 NOTE — ED Notes (Signed)
Seizure pads in place. Pericare provided by [employer] NT. Bed locked low. Rails up. Call bell within reach. Door open.

## 2019-05-30 NOTE — ED Notes (Signed)
Called lab to add on ethanol. State they will add on now.

## 2019-05-30 NOTE — ED Notes (Signed)
Placed 2nd Iv as EMS one won't draw back enough blood.

## 2019-05-30 NOTE — ED Triage Notes (Signed)
Pt in via EMS from independent living [place] for seizure today. Unresponsive initially. 128/67BP; 136BG; 92% RA. Allergy to wellbutrin. Pt remains confused per EMS. Pt covered in urine and stool per EMS. Pt 88% RA in ER. Placed on 6L via [place].

## 2019-05-30 NOTE — ED Notes (Signed)
Pt's son at bedside. Pt remains off unit at CT. This RN to send blood work and give keppra once pt back. Son states pt has intolerance to dilantin and that in past he was told not to take it as he has dealt with dilantin toxicity before.

## 2019-05-30 NOTE — H&P (Signed)
History and Physical  COLON RUETH HOZ:[id number] DOB: 21-Apr-1945 DOA: 05/30/2019  PCP: Barbette Reichmann, MD    Chief Complaint: Seizures  HPI:  74 year old man PMH seizure disorder status post stroke approximately 5 years ago, Parkinson's disease?,  Subclinical COPD, presented to the emergency department after a seizure at independent living.  Had a second seizure in the emergency department was referred for admission as he remained postictal.  Patient is currently postictal.  All history obtained from his son at bedside who has financial power of attorney, there is no medical power of attorney.  Patient is not married.  There is one other son who lives in [place].  Son was notified by the emergency department that his father was at assisted living.  He last saw his father yesterday when he took him out to dinner, and at that time his father seemed to be his normal self.  He has close contact with his father and talks to him every night, the patient continues to manage all his own affairs including his own finances and self-care and son has no concerns in regard to memory or the patient's ability to take medications or manage his affairs.  He does note that his father drinks several drinks of alcohol per day, however this does not seem to have been an issue, and he is never witnessed him being intoxicated.  As far as the son knows the patient has been compliant with all his medications.  Patient did run out of 1 medication in the last 2 days, this is reported in the chart as Depakote, but the son thinks this may have been propranolol.   Per chart EMS was called to the patient's facility for seizure.  No further history available.  Chart review: . Last hospitalized 2016 for seizure  ED Course: Treated with Keppra, IV fluids  Review of Systems:  Not obtainable secondary to patient condition  PMH . COPD . Seizures status post stroke . Stroke  . Remainder reviewed in Epic  PSH . Total  knee arthroplasty . Spinal fusion . Shoulder surgery . Remainder reviewed in Epic  Family history includes: . Father with COPD . Remainder reviewed in Epic  Social History . Drinks 2 mixed drinks per day and perhaps a beer . Former smoker . No drugs  Allergies . Dilantin . Wellbutrin . Remainder reviewed in Epic  Meds include: Marland Kitchen Keppra, Depakote, Lamictal, propranolol, amlodipine . Remainder reviewed in Epic Current Meds  Medication Sig  . amLODipine (NORVASC) 2.5 MG tablet Take 2.5 mg by mouth daily.  Marland Kitchen atorvastatin (LIPITOR) 20 MG tablet Take 1 tablet (20 mg total) by mouth daily.  . clopidogrel (PLAVIX) 75 MG tablet Take 1 tablet (75 mg total) by mouth daily.  Marland Kitchen lamoTRIgine (LAMICTAL) 100 MG tablet Take 150 mg by mouth 2 (two) times daily.  Marland Kitchen levETIRAcetam (KEPPRA) 500 MG tablet Take 3 tablets (1,500 mg total) by mouth 2 (two) times daily.  . propranolol (INDERAL) 20 MG tablet Take 20 mg by mouth 2 (two) times daily.  . sertraline (ZOLOFT) 100 MG tablet Take 1.5 tablets (150 mg total) by mouth daily. (Patient taking differently: Take 200 mg by mouth daily. )    Physicial Exam   Vitals:  . 98.1, 19, 99, 179/81  Constitutional:    Upon entering the room the patient was having a generalized tonic-clonic seizure.  This lasted approximately 30 seconds to 60 seconds and resolved spontaneously before action could be taken.  It was associated with  brief hypoxia which corrected spontaneously.    Patient is postictal, he does open his eyes and orient to the examiner but does not follow commands or answer questions  Eyes:  . pupils and irises appear normal . Normal lids  ENMT:  . Cannot assess hearing . Lips appear normal . Does not open mouth, cannot assess mouth Neck:  . neck appears normal, no masses . no thyromegaly Respiratory:  . CTA bilaterally, no w/r/r.  . Respiratory effort normal.  Cardiovascular:  . RRR, no m/r/g . No LE extremity edema   Abdomen:  .  Obese, soft, nontender, nondistended Musculoskeletal:  . Digits/nails BUE: no clubbing, cyanosis, petechiae, infection . exam of joints, bones, muscles of at least one of following: head/neck, RUE, LUE, RLE, LLE   o strength and tone normal, no atrophy, no abnormal movements o No tenderness, masses Skin:  . No rashes, lesions, ulcers . palpation of skin: no induration or nodules Neurologic:  . Grossly nonfocal, cannot participate with examination.  He does move all extremities equally and with grossly normal tone and strength.  Face is symmetric. Marland Kitchen Per son, no focal deficits noted previous Psychiatric:  . Mental status . Cannot assess mood, affect, orientation, judgment or insight   I have personally reviewed following labs and imaging studies  Labs:  Marland Kitchen Complete metabolic panel unremarkable except for AST of 62 . BMP within normal limits.  Troponins 18, 20. Marland Kitchen WBC 13.1, remainder CBC unremarkable . SARS-CoV-2 negative  Imaging studies:   CT head no acute abnormalities.  Medical tests:   EKG independently reviewed: Poor quality EKG but appears to be sinus arrhythmia.  No acute changes noted.  ASSESSMENT/PLAN  Seizure disorder with breakthrough seizures, postictal state.  Not clear whether the patient ran out of Depakote or not, but other medications include Lamictal and Keppra.  1 witnessed seizure in the emergency department spontaneously resolved.  Now postictal. --Has significant desaturation for approximately 20 seconds with last seizure.  Currently protecting airway.  Will admit to stepdown unit for close monitoring of airway and clinical condition. --No focal deficits, no history to suggest stroke, no history to suggest recent illness or infection.  No signs or symptoms to suggest encephalitis or meningitis.  No obvious metabolic abnormalities. --Already has been loaded with Keppra.  Will monitor and treat with Ativan as needed. --If has further seizures, may need to add  second agent. --EEG --Neurology consultation  Daily alcohol use.  Reportedly 2-3 drinks.  Son does not think the patient would have quit drinking.  Withdrawal in the differential but does not seem likely. --CIWA  Parkinson's disease? --Unclear diagnosis.  Monitor clinically.  DVT prophylaxis: SCDs Code Status: Full per son Family Communication: son at bedside Consults called: neurology    Time spent: 27 minutes  Brendia Sacks, MD  [facility] Direct contact: see [email or web address]  7PM-7AM contact night coverage as below   1. Check the care team in [facility] and look for a) attending/consulting [facility] provider listed and b) the [facility] team listed 2. Log into [email or web address] and use [place]'s universal password to access. If you do not have the password, please contact the hospital operator. 3. Locate the [facility] provider you are looking for under [facility] and page to a number that you can be directly reached. 4. If you still have difficulty reaching the provider, please page the [facility] (Director on Call) for the Hospitalists listed on amion for assistance.  Severity of Illness: The appropriate patient status for this  patient is OBSERVATION. Observation status is judged to be reasonable and necessary in order to provide the required intensity of service to ensure the patient's safety. The patient's presenting symptoms, physical exam findings, and initial radiographic and laboratory data in the context of their medical condition is felt to place them at decreased risk for further clinical deterioration. Furthermore, it is anticipated that the patient will be medically stable for discharge from the hospital within 2 midnights of admission. The following factors support the patient status of observation.   " The patient's presenting symptoms include seizure, postictal state. " The physical exam findings include post ictal state, encephalopathic, not able to follow commands. " The initial  radiographic and laboratory data are reassuring.    Status is: Observation  The patient remains OBS appropriate and will d/c before 2 midnights.  Dispo: The patient is from: Home              Anticipated d/c is to: Home              Anticipated d/c date is: 1 day              Patient currently is not medically stable to d/c.  05/30/2019, 6:33 PM   Principal Problem:   Seizure ([place]) Active Problems:   Alcohol use   Post-ictal state ([place])

## 2019-05-30 NOTE — ED Notes (Signed)
Pt repositioned

## 2019-05-30 NOTE — ED Notes (Signed)
Ready bed @ 1750, spoke with RN Greta Doom.

## 2019-05-30 NOTE — ED Notes (Signed)
Called lab to add on BNP. State they will run now.

## 2019-05-31 DIAGNOSIS — Z79899 Other long term (current) drug therapy: Secondary | ICD-10-CM | POA: Diagnosis not present

## 2019-05-31 DIAGNOSIS — Z87891 Personal history of nicotine dependence: Secondary | ICD-10-CM | POA: Diagnosis not present

## 2019-05-31 DIAGNOSIS — G40409 Other generalized epilepsy and epileptic syndromes, not intractable, without status epilepticus: Secondary | ICD-10-CM | POA: Diagnosis present

## 2019-05-31 DIAGNOSIS — Z825 Family history of asthma and other chronic lower respiratory diseases: Secondary | ICD-10-CM | POA: Diagnosis not present

## 2019-05-31 DIAGNOSIS — Z7289 Other problems related to lifestyle: Secondary | ICD-10-CM | POA: Diagnosis not present

## 2019-05-31 DIAGNOSIS — F419 Anxiety disorder, unspecified: Secondary | ICD-10-CM | POA: Diagnosis present

## 2019-05-31 DIAGNOSIS — F329 Major depressive disorder, single episode, unspecified: Secondary | ICD-10-CM | POA: Diagnosis present

## 2019-05-31 DIAGNOSIS — Z8673 Personal history of transient ischemic attack (TIA), and cerebral infarction without residual deficits: Secondary | ICD-10-CM | POA: Diagnosis not present

## 2019-05-31 DIAGNOSIS — Z9114 Patient's other noncompliance with medication regimen: Secondary | ICD-10-CM | POA: Diagnosis not present

## 2019-05-31 DIAGNOSIS — Z20822 Contact with and (suspected) exposure to covid-19: Secondary | ICD-10-CM | POA: Diagnosis present

## 2019-05-31 DIAGNOSIS — Z888 Allergy status to other drugs, medicaments and biological substances status: Secondary | ICD-10-CM | POA: Diagnosis not present

## 2019-05-31 DIAGNOSIS — Z79890 Hormone replacement therapy (postmenopausal): Secondary | ICD-10-CM | POA: Diagnosis not present

## 2019-05-31 DIAGNOSIS — J449 Chronic obstructive pulmonary disease, unspecified: Secondary | ICD-10-CM | POA: Diagnosis present

## 2019-05-31 DIAGNOSIS — Z981 Arthrodesis status: Secondary | ICD-10-CM | POA: Diagnosis not present

## 2019-05-31 DIAGNOSIS — G20 Parkinson's disease: Secondary | ICD-10-CM | POA: Diagnosis present

## 2019-05-31 DIAGNOSIS — R569 Unspecified convulsions: Secondary | ICD-10-CM

## 2019-05-31 DIAGNOSIS — I10 Essential (primary) hypertension: Secondary | ICD-10-CM | POA: Diagnosis present

## 2019-05-31 DIAGNOSIS — Z7902 Long term (current) use of antithrombotics/antiplatelets: Secondary | ICD-10-CM | POA: Diagnosis not present

## 2019-05-31 LAB — CBC WITH DIFFERENTIAL/PLATELET
Abs Immature Granulocytes: 0.06 10*3/uL (ref 0.00–0.07)
Basophils Absolute: 0 10*3/uL (ref 0.0–0.1)
Basophils Relative: 0 %
Eosinophils Absolute: 0.1 10*3/uL (ref 0.0–0.5)
Eosinophils Relative: 0 %
HCT: 42.6 % (ref 39.0–52.0)
Hemoglobin: 15.3 g/dL (ref 13.0–17.0)
Immature Granulocytes: 1 %
Lymphocytes Relative: 15 %
Lymphs Abs: 1.8 10*3/uL (ref 0.7–4.0)
MCH: 33.8 pg (ref 26.0–34.0)
MCHC: 35.9 g/dL (ref 30.0–36.0)
MCV: 94 fL (ref 80.0–100.0)
Monocytes Absolute: 1.8 10*3/uL — ABNORMAL HIGH (ref 0.1–1.0)
Monocytes Relative: 14 %
Neutro Abs: 8.7 10*3/uL — ABNORMAL HIGH (ref 1.7–7.7)
Neutrophils Relative %: 70 %
Platelets: 166 10*3/uL (ref 150–400)
RBC: 4.53 MIL/uL (ref 4.22–5.81)
RDW: 11.6 % (ref 11.5–15.5)
WBC: 12.4 10*3/uL — ABNORMAL HIGH (ref 4.0–10.5)
nRBC: 0 % (ref 0.0–0.2)

## 2019-05-31 LAB — GLUCOSE, CAPILLARY
Glucose-Capillary: 102 mg/dL — ABNORMAL HIGH (ref 70–99)
Glucose-Capillary: 102 mg/dL — ABNORMAL HIGH (ref 70–99)
Glucose-Capillary: 98 mg/dL (ref 70–99)
Glucose-Capillary: 99 mg/dL (ref 70–99)

## 2019-05-31 LAB — BASIC METABOLIC PANEL
Anion gap: 8 (ref 5–15)
BUN: 11 mg/dL (ref 8–23)
CO2: 25 mmol/L (ref 22–32)
Calcium: 9.3 mg/dL (ref 8.9–10.3)
Chloride: 102 mmol/L (ref 98–111)
Creatinine, Ser: 0.8 mg/dL (ref 0.61–1.24)
GFR calc Af Amer: >60 mL/min (ref >60)
GFR calc non Af Amer: >60 mL/min (ref >60)
Glucose, Bld: 113 mg/dL — ABNORMAL HIGH (ref 70–99)
Potassium: 3.4 mmol/L — ABNORMAL LOW (ref 3.5–5.1)
Sodium: 135 mmol/L (ref 135–145)

## 2019-05-31 LAB — VALPROIC ACID LEVEL: Valproic Acid Lvl: <10 ug/mL — ABNORMAL LOW (ref 50.0–100.0)

## 2019-05-31 MED ORDER — SODIUM CHLORIDE 0.9 % IV SOLN
100.0000 mg | Freq: Once | INTRAVENOUS | Status: AC
  Administered 2019-05-31: 15:00:00 100 mg via INTRAVENOUS
  Filled 2019-05-31: qty 10

## 2019-05-31 MED ORDER — CHLORHEXIDINE GLUCONATE CLOTH 2 % EX PADS
6.0000 | MEDICATED_PAD | Freq: Every day | CUTANEOUS | Status: DC
  Administered 2019-05-31 – 2019-06-03 (×4): 6 via TOPICAL

## 2019-05-31 MED ORDER — LEVETIRACETAM IN NACL 1500 MG/100ML IV SOLN
1500.0000 mg | Freq: Two times a day (BID) | INTRAVENOUS | Status: DC
  Administered 2019-05-31 – 2019-06-03 (×6): 1500 mg via INTRAVENOUS
  Filled 2019-05-31 (×8): qty 100

## 2019-05-31 NOTE — Progress Notes (Signed)
Patient is Obs status. CSW spoke to [employer] who reported patient is still disoriented. CSW attempted call to patient's son/POA for MOON letter. Left a voicemail requesting a return call.   Alfonso Ramus, [place] [phone]

## 2019-05-31 NOTE — Plan of Care (Signed)

## 2019-05-31 NOTE — Care Management Obs Status (Signed)
MEDICARE OBSERVATION STATUS NOTIFICATION   Patient Details  Name: Ian Gill MRN: [id number] Date of Birth: 09/21/1945   Medicare Observation Status Notification Given:  Yes  Per RN, patient is still disoriented. CSW spoke with patient's son/POA Ian Gill via phone and gave notification. Ian Leriche verbalized understanding and gave CSW permission to sign on his behalf. CSW offered to mail or email copy of letter to [place]. He requested a copy be sent to him via email. CSW sent copy via email.   Ian Musto E Mikah Rottinghaus, LCSW 05/31/2019, 3:53 PM

## 2019-05-31 NOTE — Progress Notes (Signed)
Patient now changed to Inpatient. Called and told Loraine Leriche to disregard MOON.  Alfonso Ramus, [place] [phone]

## 2019-05-31 NOTE — Consult Note (Addendum)
Reason for Consult: seizures  Requesting Physician: Dr. Irene Limbo    CC: seizures HPI: Ian Gill is an 74 y.o. male  PMH seizure disorder status post stroke approximately 5 years ago, l COPD, presented to the emergency department after a seizure at independent living.  Had a second seizure in the emergency department was referred for admission as he remained postictal. There was possible suspicion of ETOH use and possibly medication non compliance.  On Keppra, VPA and Lamictal at home.       Past Medical History:  Diagnosis Date  . Anxiety   . COPD (chronic obstructive pulmonary disease) (HCC)   . Depression   . Parkinson's disease (HCC)   . Seizures (HCC)   . Stroke [facility])     Past Surgical History:  Procedure Laterality Date  . Shoulder surgery    . SPINAL FUSION     cervical  . TOTAL KNEE ARTHROPLASTY      Family History  Problem Relation Age of Onset  . COPD Father     Social History:  reports that he has quit smoking. He has never used smokeless tobacco. He reports current alcohol use of about 3.0 standard drinks of alcohol per week. He reports that he does not use drugs.  Allergies  Allergen Reactions  . Dilantin [Phenytoin Sodium Extended]     Past issues with toxicity per pt's son  . Wellbutrin [Bupropion] Other (See Comments)    Seizures     Medications: I have reviewed the patient's current medications.  ROS: Unable to obtain due to sedation   Physical Examination: Blood pressure 140/72, pulse 82, temperature 98.6 F (37 C), temperature source Axillary, resp. rate 15, height 5\' 8"  (1.727 m), weight 105.3 kg, SpO2 98 %.  Obtunded Not following commands Withdrawal of painful stimuli bilaterally Pupils and corneals react   Laboratory Studies:   Basic Metabolic Panel: Recent Labs  Lab 05/30/19 1425 05/31/19 0627  NA 135 135  K 3.8 3.4*  CL 100 102  CO2 24 25  GLUCOSE 130* 113*  BUN 15 11  CREATININE 0.82 0.80  CALCIUM 9.7 9.3     Liver Function Tests: Recent Labs  Lab 05/30/19 1425  AST 62*  ALT 27  ALKPHOS 49  BILITOT 0.9  PROT 7.9  ALBUMIN 5.1*   No results for input(s): LIPASE, AMYLASE in the last 168 hours. No results for input(s): AMMONIA in the last 168 hours.  CBC: Recent Labs  Lab 05/30/19 1425 05/31/19 0627  WBC 13.1* 12.4*  NEUTROABS 11.3* 8.7*  HGB 17.6* 15.3  HCT 47.2 42.6  MCV 92.0 94.0  PLT 173 166    Cardiac Enzymes: No results for input(s): CKTOTAL, CKMB, CKMBINDEX, TROPONINI in the last 168 hours.  BNP: Invalid input(s): POCBNP  CBG: Recent Labs  Lab 05/30/19 1855 05/31/19 0603  GLUCAP 123* 102*    Microbiology: Results for orders placed or performed during the hospital encounter of 05/30/19  Respiratory Panel by RT PCR (Flu A&B, Covid) - Nasopharyngeal Swab     Status: None   Collection Time: 05/30/19  3:44 PM   Specimen: Nasopharyngeal Swab  Result Value Ref Range Status   SARS Coronavirus 2 by RT PCR NEGATIVE NEGATIVE Final    Comment: (NOTE) SARS-CoV-2 target nucleic acids are NOT DETECTED. The SARS-CoV-2 RNA is generally detectable in upper respiratoy specimens during the acute phase of infection. The lowest concentration of SARS-CoV-2 viral copies this assay can detect is 131 copies/mL. A negative result does not  preclude SARS-Cov-2 infection and should not be used as the sole basis for treatment or other patient management decisions. A negative result may occur with  improper specimen collection/handling, submission of specimen other than nasopharyngeal swab, presence of viral mutation(s) within the areas targeted by this assay, and inadequate number of viral copies (<131 copies/mL). A negative result must be combined with clinical observations, patient history, and epidemiological information. The expected result is Negative. Fact Sheet for Patients:  PinkCheek.be Fact Sheet for Healthcare Providers:   GravelBags.it This test is not yet ap proved or cleared by the [place] FDA and  has been authorized for detection and/or diagnosis of SARS-CoV-2 by FDA under an Emergency Use Authorization (EUA). This EUA will remain  in effect (meaning this test can be used) for the duration of the COVID-19 declaration under Section 564(b)(1) of the Act, 21 U.S.C. section 360bbb-3(b)(1), unless the authorization is terminated or revoked sooner.    Influenza A by PCR NEGATIVE NEGATIVE Final   Influenza B by PCR NEGATIVE NEGATIVE Final    Comment: (NOTE) The Xpert Xpress SARS-CoV-2/FLU/RSV assay is intended as an aid in  the diagnosis of influenza from Nasopharyngeal swab specimens and  should not be used as a sole basis for treatment. Nasal washings and  aspirates are unacceptable for Xpert Xpress SARS-CoV-2/FLU/RSV  testing. Fact Sheet for Patients: PinkCheek.be Fact Sheet for Healthcare Providers: GravelBags.it This test is not yet approved or cleared by the [place] FDA and  has been authorized for detection and/or diagnosis of SARS-CoV-2 by  FDA under an Emergency Use Authorization (EUA). This EUA will remain  in effect (meaning this test can be used) for the duration of the  Covid-19 declaration under Section 564(b)(1) of the Act, 21  U.S.C. section 360bbb-3(b)(1), unless the authorization is  terminated or revoked. Performed at [facility], [address] [place], [place] [address]   MRSA PCR Screening     Status: None   Collection Time: 05/30/19  7:31 PM   Specimen: Nasal Mucosa; Nasopharyngeal  Result Value Ref Range Status   MRSA by PCR NEGATIVE NEGATIVE Final    Comment:        The GeneXpert MRSA Assay (FDA approved for NASAL specimens only), is one component of a comprehensive MRSA colonization surveillance program. It is not intended to diagnose MRSA infection nor to  guide or monitor treatment for MRSA infections. Performed at [facility], [address] [place], [place] [address]     Coagulation Studies: No results for input(s): LABPROT, INR in the last 72 hours.  Urinalysis: No results for input(s): COLORURINE, LABSPEC, PHURINE, GLUCOSEU, HGBUR, BILIRUBINUR, KETONESUR, PROTEINUR, UROBILINOGEN, NITRITE, LEUKOCYTESUR in the last 168 hours.  Invalid input(s): APPERANCEUR  Lipid Panel:  No results found for: CHOL, TRIG, HDL, CHOLHDL, VLDL, LDLCALC  HgbA1C: No results found for: HGBA1C  Urine Drug Screen:      Component Value Date/Time   LABOPIA NONE DETECTED 10/14/2014 1716   COCAINSCRNUR NONE DETECTED 10/14/2014 1716   LABBENZ POSITIVE (A) 10/14/2014 1716   AMPHETMU NONE DETECTED 10/14/2014 1716   THCU NONE DETECTED 10/14/2014 1716   LABBARB NONE DETECTED 10/14/2014 1716    Alcohol Level:  Recent Labs  Lab 05/30/19 1500 05/30/19 1918  ETH <10 <10    Other results: EKG: normal EKG, normal sinus rhythm, unchanged from previous tracings.  Imaging: CT Head Wo Contrast  Result Date: 05/30/2019 CLINICAL DATA:  Seizure, nontraumatic. Additional history provided: Seizure with fall today, history of seizures.  EXAM: CT HEAD WITHOUT CONTRAST TECHNIQUE: Contiguous axial images were obtained from the base of the skull through the vertex without intravenous contrast. COMPARISON:  Head CT 07/11/2015 FINDINGS: Brain: Stable, mild generalized parenchymal atrophy. There is no acute intracranial hemorrhage. No demarcated cortical infarct. No extra-axial fluid collection. No evidence of intracranial mass. No midline shift. Vascular: No hyperdense vessel.  Atherosclerotic calcifications. Skull: Normal. Negative for fracture or focal lesion. Sinuses/Orbits: Visualized orbits show no acute finding. Mild ethmoid and sphenoid sinus mucosal thickening at the imaged levels. No significant mastoid effusion. IMPRESSION: No evidence of acute intracranial  abnormality. Stable, mild generalized parenchymal atrophy. Mild ethmoid and sphenoid sinus mucosal thickening at the imaged levels. Electronically Signed   By: Jackey Loge DO   On: 05/30/2019 15:06   DG Chest Portable 1 View  Result Date: 05/30/2019 CLINICAL DATA:  Syncope EXAM: PORTABLE CHEST 1 VIEW COMPARISON:  January 23, 2014 FINDINGS: There is mild interstitial thickening. There is no frank edema or consolidation. Heart is upper normal in size with pulmonary vascularity normal. No adenopathy. 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 There is degenerative change in the left shoulder. There is postoperative change in the mid to lower cervical spine. IMPRESSION: Interstitial thickening without edema or consolidation. Question a degree of underlying inflammatory change/fibrosis. Heart upper normal in size. No adenopathy appreciable. Electronically Signed   By: Bretta Bang III M.D.   On: 05/30/2019 14:52     Assessment/Plan:  74 y.o. male  PMH seizure disorder status post stroke approximately 5 years ago, l COPD, presented to the emergency department after a seizure at independent living.  Had a second seizure in the emergency department was referred for admission as he remained postictal. There was possible suspicion of ETOH use and possibly medication non compliance.  On Keppra, VPA and Lamictal at home.    - Keppra increased to 1500 BID - VPA shortage unable to start PO and he is not following commands - Lamictal is also only PO - if Doesn't improve today might consider NG for medications - if further seizures today or epileptiform discharges on EEG load with phenytoin 15mg /kg.   - CTH no acute abnormalities - would hold off further imaging as known hx of seizures.    Addendum: Appreciate input from Dr. regarding pt's allergy to phenytoin.  Will wait for EEG and see how pt does today we have options with vimpat if needed.   05/31/2019, 11:35 AM

## 2019-05-31 NOTE — Procedures (Signed)
Patient Name: Ian Gill  MRN: [id number]  Epilepsy Attending: Charlsie Quest  Referring Physician/Provider: Dr. Brendia Sacks Date: 05/31/2019 Duration: 22.53 minutes  Patient history: 74 year old male with past medical history of seizures presented with another seizure-like episode.  EEG to evaluate for seizures.  Level of alertness: Awake, sleep  AEDs during EEG study: Keppra, lorazepam  Technical aspects: This EEG study was done with scalp electrodes positioned according to the 10-20 International system of electrode placement. Electrical activity was acquired at a sampling rate of 500Hz  and reviewed with a high frequency filter of 70Hz  and a low frequency filter of 1Hz . EEG data were recorded continuously and digitally stored.   Description: The posterior dominant rhythm consists of 8-9 Hz activity of moderate voltage (25-35 uV) seen predominantly in posterior head regions, symmetric and reactive to eye opening and eye closing.  Sleep was characterized by vertex waves, sleep spindles (12 to 14 Hz), maximal frontocentral region.  EEG also showed generalized spike and polyspike and wave discharges. Hyperventilation photic summation were not performed.          Abnormality -Spikes and polyspikes, generalized  IMPRESSION: This study showed evidence of generalized epilepsy. No seizures were seen throughout the recording.     Lakechia Nay 

## 2019-05-31 NOTE — Progress Notes (Addendum)
PROGRESS NOTE  Ian Gill JKK:[id number] DOB: 10-Dec-1945 DOA: 05/30/2019 PCP: Barbette Reichmann, MD  Brief History   74 year old man PMH seizure disorder status post stroke approximately 5 years ago, Parkinson's disease?,  Subclinical COPD, presented to the emergency department after a seizure at independent living.  Had a second seizure in the emergency department was referred for admission as he remained postictal.  A & P  Seizure disorder with breakthrough seizures, postictal state.  Not clear whether the patient ran out of Depakote or not, but other medications include Lamictal and Keppra.  1 witnessed seizure in the emergency department spontaneously resolved.    1 seizure overnight after admission. --Seems to be coming around and does follow some simple commands although he is still postictal. --Keppra increased to 1500 mg twice daily per neurology.  Restart Lamictal and Depakote when able to take p.o.  There is a Sport and exercise psychologist for IV Depacon. --If further seizures, consider NG tube for administration of oral medications.  --Follow-up EEG, consider Vimpat if needed  Daily alcohol use.  Reportedly 2-3 drinks.  Son does not think the patient would have quit drinking.  Withdrawal in the differential but does not seem likely. --No evidence of withdrawal.  Continue CIWA  Parkinson's disease? --Unclear diagnosis.  Monitor clinically.  Resume propranolol when able.  Personally it seems like propranolol is for tremor.  Disposition Plan:   DVT prophylaxis: SCDs Code Status: Full Family Communication: Spoke with son by telephone    Brendia Sacks, MD  [facility] Direct contact: see [email or web address] (further directions at bottom of note if needed) 7PM-7AM contact night coverage as at bottom of note 05/31/2019, 1:49 PM  LOS: 0 days   Significant Hospital Events   .    Consults:  . Neurology    Procedures:  . EEG pending  Significant Diagnostic Tests:  . CT head NAD    Micro Data:  .    Antimicrobials:  . None  Interval History/Subjective  1 seizure overnight.  Patient still postictal but able to answer some simple questions.  History not really available though.  Objective   Vitals:  Vitals:   05/31/19 1100 05/31/19 1200  BP: 140/72 125/71  Pulse: 82 75  Resp: 15 14  Temp:    SpO2: 98% 95%    Exam:  Constitutional.  Appears mildly restless, comfortable, nontoxic. Psychiatric.  Orients to examiner, answers some simple questions such as his name, date of birth, name of his son. Musculoskeletal.  Follows simple commands, moves all extremities. Cardiovascular.  Regular rate and rhythm.  No murmur, rub or gallop.  Telemetry irregular, difficult to ascertain rhythm. Respiratory.  Clear to auscultation bilaterally.  No wheezes, rales or rhonchi.  Normal respiratory effort.   I have personally reviewed the following:   Today's Data  . CBG stable . Potassium 3.4, remainder BMP unremarkable. . WBC 12.4, remainder CBC unremarkable . Valproic acid level less than 10 . EKG poor quality secondary to tremor but appears to be sinus rhythm  Scheduled Meds: . Chlorhexidine Gluconate Cloth  6 each Topical Daily  . clopidogrel  75 mg Oral Daily  . LORazepam  0-4 mg Intravenous Q6H   Or  . LORazepam  0-4 mg Oral Q6H  . [START ON 06/02/2019] LORazepam  0-4 mg Intravenous Q12H   Or  . [START ON 06/02/2019] LORazepam  0-4 mg Oral Q12H  . thiamine  100 mg Oral Daily   Or  . thiamine  100 mg Intravenous Daily  Continuous Infusions: . sodium chloride    . levETIRAcetam      Principal Problem:   Seizure ([place]) Active Problems:   Alcohol use   Post-ictal state ([place])   LOS: 0 days   How to contact the [facility] Attending or Consulting provider [place] or covering provider during after hours [place], for this patient?  1. Check the care team in [facility] and look for a) attending/consulting [facility] provider listed and b) the [facility] team listed 2. Log into  [email or web address] and use [place]'s universal password to access. If you do not have the password, please contact the hospital operator. 3. Locate the [facility] provider you are looking for under [facility] and page to a number that you can be directly reached. 4. If you still have difficulty reaching the provider, please page the [facility] (Director on Call) for the Hospitalists listed on amion for assistance.

## 2019-05-31 NOTE — Progress Notes (Signed)
Shift summary:  - Lethargic this AM; intermittently following commands.  - Plan for EEG today.

## 2019-05-31 NOTE — Progress Notes (Signed)
eeg completed ° °

## 2019-06-01 LAB — GLUCOSE, CAPILLARY
Glucose-Capillary: 107 mg/dL — ABNORMAL HIGH (ref 70–99)
Glucose-Capillary: 121 mg/dL — ABNORMAL HIGH (ref 70–99)

## 2019-06-01 MED ORDER — DIVALPROEX SODIUM 500 MG PO DR TAB
750.0000 mg | DELAYED_RELEASE_TABLET | Freq: Every day | ORAL | Status: DC
  Filled 2019-06-01 (×2): qty 1

## 2019-06-01 MED ORDER — DIVALPROEX SODIUM 250 MG PO DR TAB: 750.0000 mg | DELAYED_RELEASE_TABLET | Freq: Two times a day (BID) | ORAL | Status: DC

## 2019-06-01 MED ORDER — DIVALPROEX SODIUM 500 MG PO DR TAB
1000.0000 mg | DELAYED_RELEASE_TABLET | Freq: Every day | ORAL | Status: DC
  Administered 2019-06-02: 21:00:00 1000 mg via ORAL
  Filled 2019-06-01 (×2): qty 2

## 2019-06-01 MED ORDER — ENOXAPARIN SODIUM 40 MG/0.4ML ~~LOC~~ SOLN
40.0000 mg | SUBCUTANEOUS | Status: DC
  Administered 2019-06-01 – 2019-06-03 (×3): 40 mg via SUBCUTANEOUS
  Filled 2019-06-01 (×3): qty 0.4

## 2019-06-01 MED ORDER — LAMOTRIGINE 100 MG PO TABS
150.0000 mg | ORAL_TABLET | Freq: Two times a day (BID) | ORAL | Status: DC
  Administered 2019-06-02 – 2019-06-04 (×5): 150 mg via ORAL
  Filled 2019-06-01: qty 6
  Filled 2019-06-01 (×4): qty 2

## 2019-06-01 MED ORDER — HYDRALAZINE HCL 20 MG/ML IJ SOLN
10.0000 mg | Freq: Once | INTRAMUSCULAR | Status: AC
  Administered 2019-06-01: 10 mg via INTRAVENOUS
  Filled 2019-06-01: qty 1

## 2019-06-01 NOTE — Progress Notes (Signed)
Subjective:  Patient is much more awake today. Following commands. No reported seizure overnight.      Past Medical History:  Diagnosis Date  . Anxiety   . COPD (chronic obstructive pulmonary disease) (HCC)   . Depression   . Parkinson's disease (HCC)   . Seizures (HCC)   . Stroke [facility])     Past Surgical History:  Procedure Laterality Date  . Shoulder surgery    . SPINAL FUSION     cervical  . TOTAL KNEE ARTHROPLASTY      Family History  Problem Relation Age of Onset  . COPD Father     Social History:  reports that he has quit smoking. He has never used smokeless tobacco. He reports current alcohol use of about 3.0 standard drinks of alcohol per week. He reports that he does not use drugs.  Allergies  Allergen Reactions  . Dilantin [Phenytoin Sodium Extended]     Past issues with toxicity per pt's son  . Wellbutrin [Bupropion] Other (See Comments)    Seizures     Medications: I have reviewed the patient's current medications.  Physical Examination: Blood pressure (!) 190/96, pulse 84, temperature 98.4 F (36.9 C), temperature source Oral, resp. rate 20, height 5\' 8"  (1.727 m), weight 105.3 kg, SpO2 92 %.   Neurological Examination   Mental Status: Alert to name.  Cranial Nerves: II: Discs flat bilaterally; Visual fields grossly normal, pupils equal, round, reactive to light and accommodation III,IV, VI: ptosis not present, extra-ocular motions intact bilaterally V,VII: smile symmetric, facial light touch sensation normal bilaterally Motor: Right : Upper extremity   5/5    Left:     Upper extremity   5/5  Lower extremity   5/5     Lower extremity   5/5 Tone and bulk:normal tone throughout; no atrophy noted Sensory: Pinprick and light touch intact throughout, bilaterally Deep Tendon Reflexes: 1+ and symmetric throughout       Laboratory Studies:   Basic Metabolic Panel: Recent Labs  Lab 05/30/19 1425 05/31/19 0627  NA 135 135  K 3.8 3.4*  CL 100  102  CO2 24 25  GLUCOSE 130* 113*  BUN 15 11  CREATININE 0.82 0.80  CALCIUM 9.7 9.3    Liver Function Tests: Recent Labs  Lab 05/30/19 1425  AST 62*  ALT 27  ALKPHOS 49  BILITOT 0.9  PROT 7.9  ALBUMIN 5.1*   No results for input(s): LIPASE, AMYLASE in the last 168 hours. No results for input(s): AMMONIA in the last 168 hours.  CBC: Recent Labs  Lab 05/30/19 1425 05/31/19 0627  WBC 13.1* 12.4*  NEUTROABS 11.3* 8.7*  HGB 17.6* 15.3  HCT 47.2 42.6  MCV 92.0 94.0  PLT 173 166    Cardiac Enzymes: No results for input(s): CKTOTAL, CKMB, CKMBINDEX, TROPONINI in the last 168 hours.  BNP: Invalid input(s): POCBNP  CBG: Recent Labs  Lab 05/31/19 0603 05/31/19 1212 05/31/19 1755 05/31/19 2346 06/01/19 0539  GLUCAP 102* 102* 98 99 107*    Microbiology: Results for orders placed or performed during the hospital encounter of 05/30/19  Respiratory Panel by RT PCR (Flu A&B, Covid) - Nasopharyngeal Swab     Status: None   Collection Time: 05/30/19  3:44 PM   Specimen: Nasopharyngeal Swab  Result Value Ref Range Status   SARS Coronavirus 2 by RT PCR NEGATIVE NEGATIVE Final    Comment: (NOTE) SARS-CoV-2 target nucleic acids are NOT DETECTED. The SARS-CoV-2 RNA is generally detectable in upper  respiratoy specimens during the acute phase of infection. The lowest concentration of SARS-CoV-2 viral copies this assay can detect is 131 copies/mL. A negative result does not preclude SARS-Cov-2 infection and should not be used as the sole basis for treatment or other patient management decisions. A negative result may occur with  improper specimen collection/handling, submission of specimen other than nasopharyngeal swab, presence of viral mutation(s) within the areas targeted by this assay, and inadequate number of viral copies (<131 copies/mL). A negative result must be combined with clinical observations, patient history, and epidemiological information. The expected  result is Negative. Fact Sheet for Patients:  https://www.moore.com/ Fact Sheet for Healthcare Providers:  https://www.young.biz/ This test is not yet ap proved or cleared by the [place] FDA and  has been authorized for detection and/or diagnosis of SARS-CoV-2 by FDA under an Emergency Use Authorization (EUA). This EUA will remain  in effect (meaning this test can be used) for the duration of the COVID-19 declaration under Section 564(b)(1) of the Act, 21 U.S.C. section 360bbb-3(b)(1), unless the authorization is terminated or revoked sooner.    Influenza A by PCR NEGATIVE NEGATIVE Final   Influenza B by PCR NEGATIVE NEGATIVE Final    Comment: (NOTE) The Xpert Xpress SARS-CoV-2/FLU/RSV assay is intended as an aid in  the diagnosis of influenza from Nasopharyngeal swab specimens and  should not be used as a sole basis for treatment. Nasal washings and  aspirates are unacceptable for Xpert Xpress SARS-CoV-2/FLU/RSV  testing. Fact Sheet for Patients: https://www.moore.com/ Fact Sheet for Healthcare Providers: https://www.young.biz/ This test is not yet approved or cleared by the [place] FDA and  has been authorized for detection and/or diagnosis of SARS-CoV-2 by  FDA under an Emergency Use Authorization (EUA). This EUA will remain  in effect (meaning this test can be used) for the duration of the  Covid-19 declaration under Section 564(b)(1) of the Act, 21  U.S.C. section 360bbb-3(b)(1), unless the authorization is  terminated or revoked. Performed at [facility], [address] [place], [place] [address]   MRSA PCR Screening     Status: None   Collection Time: 05/30/19  7:31 PM   Specimen: Nasal Mucosa; Nasopharyngeal  Result Value Ref Range Status   MRSA by PCR NEGATIVE NEGATIVE Final    Comment:        The GeneXpert MRSA Assay (FDA approved for NASAL specimens only), is one  component of a comprehensive MRSA colonization surveillance program. It is not intended to diagnose MRSA infection nor to guide or monitor treatment for MRSA infections. Performed at [facility], [address] [place], [place] [address]     Coagulation Studies: No results for input(s): LABPROT, INR in the last 72 hours.  Urinalysis: No results for input(s): COLORURINE, LABSPEC, PHURINE, GLUCOSEU, HGBUR, BILIRUBINUR, KETONESUR, PROTEINUR, UROBILINOGEN, NITRITE, LEUKOCYTESUR in the last 168 hours.  Invalid input(s): APPERANCEUR  Lipid Panel:  No results found for: CHOL, TRIG, HDL, CHOLHDL, VLDL, LDLCALC  HgbA1C: No results found for: HGBA1C  Urine Drug Screen:      Component Value Date/Time   LABOPIA NONE DETECTED 10/14/2014 1716   COCAINSCRNUR NONE DETECTED 10/14/2014 1716   LABBENZ POSITIVE (A) 10/14/2014 1716   AMPHETMU NONE DETECTED 10/14/2014 1716   THCU NONE DETECTED 10/14/2014 1716   LABBARB NONE DETECTED 10/14/2014 1716    Alcohol Level:  Recent Labs  Lab 05/30/19 1500 05/30/19 1918  ETH <10 <10    Other results: EKG: normal EKG, normal sinus rhythm, unchanged from  previous tracings.  Imaging: EEG  Result Date: 05/31/2019 Charlsie Quest, MD     05/31/2019  2:25 PM Patient Name: Ian Gill MRN: [id number] Epilepsy Attending: Charlsie Quest Referring Physician/Provider: Dr. Brendia Sacks Date: 05/31/2019 Duration: 22.53 minutes Patient history: 74 year old male with past medical history of seizures presented with another seizure-like episode.  EEG to evaluate for seizures. Level of alertness: Awake, sleep AEDs during EEG study: Keppra, lorazepam Technical aspects: This EEG study was done with scalp electrodes positioned according to the 10-20 International system of electrode placement. Electrical activity was acquired at a sampling rate of 500Hz  and reviewed with a high frequency filter of 70Hz  and a low frequency filter of 1Hz . EEG data were  recorded continuously and digitally stored. Description: The posterior dominant rhythm consists of 8-9 Hz activity of moderate voltage (25-35 uV) seen predominantly in posterior head regions, symmetric and reactive to eye opening and eye closing.  Sleep was characterized by vertex waves, sleep spindles (12 to 14 Hz), maximal frontocentral region.  EEG also showed generalized spike and polyspike and wave discharges. Hyperventilation photic summation were not performed.        Abnormality -Spikes and polyspikes, generalized IMPRESSION: This study showed evidence of generalized epilepsy. No seizures were seen throughout the recording.   CT Head Wo Contrast  Result Date: 05/30/2019 CLINICAL DATA:  Seizure, nontraumatic. Additional history provided: Seizure with fall today, history of seizures. EXAM: CT HEAD WITHOUT CONTRAST TECHNIQUE: Contiguous axial images were obtained from the base of the skull through the vertex without intravenous contrast. COMPARISON:  Head CT 07/11/2015 FINDINGS: Brain: Stable, mild generalized parenchymal atrophy. There is no acute intracranial hemorrhage. No demarcated cortical infarct. No extra-axial fluid collection. No evidence of intracranial mass. No midline shift. Vascular: No hyperdense vessel.  Atherosclerotic calcifications. Skull: Normal. Negative for fracture or focal lesion. Sinuses/Orbits: Visualized orbits show no acute finding. Mild ethmoid and sphenoid sinus mucosal thickening at the imaged levels. No significant mastoid effusion. IMPRESSION: No evidence of acute intracranial abnormality. Stable, mild generalized parenchymal atrophy. Mild ethmoid and sphenoid sinus mucosal thickening at the imaged levels. Electronically Signed   By: Charlsie Quest DO   On: 05/30/2019 15:06   DG Chest Portable 1 View  Result Date: 05/30/2019 CLINICAL DATA:  Syncope EXAM: PORTABLE CHEST 1 VIEW COMPARISON:  January 23, 2014 FINDINGS: There is mild interstitial thickening.  There is no frank edema or consolidation. Heart is upper normal in size with pulmonary vascularity normal. No adenopathy. 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 There is degenerative change in the left shoulder. There is postoperative change in the mid to lower cervical spine. IMPRESSION: Interstitial thickening without edema or consolidation. Question a degree of underlying inflammatory change/fibrosis. Heart upper normal in size. No adenopathy appreciable. Electronically Signed   By: 07/30/2019 III M.D.   On: 05/30/2019 14:52     Assessment/Plan:  74 y.o. male  PMH seizure disorder status post stroke approximately 5 years ago, l COPD, presented to the emergency department after a seizure at independent living.  Had a second seizure in the emergency department was referred for admission as he remained postictal. There was possible suspicion of ETOH use and possibly medication non compliance.  On Keppra, VPA and Lamictal at home. Significant improvement overnight. No seizure activity  - Keppra at 1500 BID - gave Vimpat 100 yesterday for abnormal EEG and somnolence - as he is awake would like assistance with speech if can can  swallow then he can be started on home Dapakote and Lamictal - If unable to swallow then I would add Vimpat 100 BID but would prefer to hold in hopes he cant take oral medications - I suspect he can come out of ICU today

## 2019-06-01 NOTE — Progress Notes (Signed)
PROGRESS NOTE    Ian Gill  ZOX:[id number] DOB: March 19, 1945 DOA: 05/30/2019 PCP: Barbette Reichmann, MD   Brief Narrative:   74 year old man PMH seizure disorder status post stroke approximately 5 years ago, Parkinson's disease?, Subclinical COPD, presented to the emergency department after a seizure at independent living. Had a second seizure in the emergency department was referred for admission as he remained postictal.  5/5: Seen and examined.  More awake today.  Was answering my questions appropriately this morning.  Per nursing the patient has been somnolent throughout the day and has difficulty following commands.  Unable to do bedside swallow evaluation.  No seizure activity noted over interval.  Keppra dose increased per neurology.  I have restarted Lamictal and Depakote and we can restart these medications and the patient is able to reliably take p.o.  Assessment & Plan:   Principal Problem:   Seizure (HCC) Active Problems:   Alcohol use   Post-ictal state (HCC)   Postictal state (HCC)  Seizure disorder with breakthrough seizures, postictal state.  Not clear whether the patient ran out of Depakote or not, but other medications include Lamictal and Keppra.  1 witnessed seizure in the emergency department spontaneously resolved.    1 seizure overnight after admission. Seems to be coming around and does follow some simple commands although he is still postictal. --Keppra increased to 1500 mg twice daily per neurology.   Plan: Continue Keppra at 1500 mg twice daily Restart Lamictal and Depakote when able to take p.o. We will consider Vimpat if unable to swallow Speech pathology consultation requested Will monitor in stepdown unit overnight.  If patient continues to wake up and no further seizure activity noted will transition to MedSurg status on 06/02/2019   Daily alcohol use. Reportedly 2-3 drinks. Son does not think the patient would have quit drinking.  Withdrawal in the differential but does not seem likely. --No evidence of withdrawal.  Continue CIWA  Parkinson's disease? --Unclear diagnosis. Monitor clinically.  Resume propranolol when able.  Personally it seems like propranolol is for tremor.    DVT prophylaxis: Lovenox Code Status: Full Family Communication: None today Disposition Plan: Status is: Inpatient  Remains inpatient appropriate because:Altered mental status   Dispo: The patient is from: Home              Anticipated d/c is to: Home              Anticipated d/c date is: 2 days              Patient currently is not medically stable to d/c.  Still lethargic and encephalopathic, presumably postictal.  Unable to reliably take p.o. at this time.        Consultants:   Neurology   Procedures:   EEG  Antimicrobials:   none   Subjective: Seen and examined Lethargic, difficult to redirect Does not reliably follow commands  Objective: Vitals:   06/01/19 0836 06/01/19 0900 06/01/19 1000 06/01/19 1434  BP:  (!) 150/73 (!) 190/96   Pulse:  83 84   Resp:  18 20   Temp: 98.4 F (36.9 C)   98.1 F (36.7 C)  TempSrc: Oral   Oral  SpO2:  94% 92%   Weight:      Height:        Intake/Output Summary (Last 24 hours) at 06/01/2019 1659 Last data filed at 06/01/2019 0600 Gross per 24 hour  Intake 1006.54 ml  Output 300 ml  Net 706.54 ml  Filed Weights   05/30/19 1900 05/31/19 0500 06/01/19 0500  Weight: 104.6 kg 105.3 kg 105.3 kg    Examination:  General exam: Appears lethargic, in no distress Respiratory system: Clear to auscultation. Respiratory effort normal. Cardiovascular system: S1 & S2 heard, RRR. No JVD, murmurs, rubs, gallops or clicks. No pedal edema. Gastrointestinal system: Abdomen is nondistended, soft and nontender. No organomegaly or masses felt. Normal bowel sounds heard. Central nervous system: Unable to assess Extremities: Symmetric 5 x 5 power. Skin: No rashes, lesions or  ulcers Psychiatry: Unable to assess    Data Reviewed: I have personally reviewed following labs and imaging studies  CBC: Recent Labs  Lab 05/30/19 1425 05/31/19 0627  WBC 13.1* 12.4*  NEUTROABS 11.3* 8.7*  HGB 17.6* 15.3  HCT 47.2 42.6  MCV 92.0 94.0  PLT 173 132   Basic Metabolic Panel: Recent Labs  Lab 05/30/19 1425 05/31/19 0627  NA 135 135  K 3.8 3.4*  CL 100 102  CO2 24 25  GLUCOSE 130* 113*  BUN 15 11  CREATININE 0.82 0.80  CALCIUM 9.7 9.3   GFR: Estimated Creatinine Clearance: 93.9 mL/min (by C-G formula based on SCr of 0.8 mg/dL). Liver Function Tests: Recent Labs  Lab 05/30/19 1425  AST 62*  ALT 27  ALKPHOS 49  BILITOT 0.9  PROT 7.9  ALBUMIN 5.1*   No results for input(s): LIPASE, AMYLASE in the last 168 hours. No results for input(s): AMMONIA in the last 168 hours. Coagulation Profile: No results for input(s): INR, PROTIME in the last 168 hours. Cardiac Enzymes: No results for input(s): CKTOTAL, CKMB, CKMBINDEX, TROPONINI in the last 168 hours. BNP (last 3 results) No results for input(s): PROBNP in the last 8760 hours. HbA1C: No results for input(s): HGBA1C in the last 72 hours. CBG: Recent Labs  Lab 05/31/19 1212 05/31/19 1755 05/31/19 2346 06/01/19 0539 06/01/19 1350  GLUCAP 102* 98 99 107* 121*   Lipid Profile: No results for input(s): CHOL, HDL, LDLCALC, TRIG, CHOLHDL, LDLDIRECT in the last 72 hours. Thyroid Function Tests: No results for input(s): TSH, T4TOTAL, FREET4, T3FREE, THYROIDAB in the last 72 hours. Anemia Panel: No results for input(s): VITAMINB12, FOLATE, FERRITIN, TIBC, IRON, RETICCTPCT in the last 72 hours. Sepsis Labs: No results for input(s): PROCALCITON, LATICACIDVEN in the last 168 hours.  Recent Results (from the past 240 hour(s))  Respiratory Panel by RT PCR (Flu A&B, Covid) - Nasopharyngeal Swab     Status: None   Collection Time: 05/30/19  3:44 PM   Specimen: Nasopharyngeal Swab  Result Value Ref  Range Status   SARS Coronavirus 2 by RT PCR NEGATIVE NEGATIVE Final    Comment: (NOTE) SARS-CoV-2 target nucleic acids are NOT DETECTED. The SARS-CoV-2 RNA is generally detectable in upper respiratoy specimens during the acute phase of infection. The lowest concentration of SARS-CoV-2 viral copies this assay can detect is 131 copies/mL. A negative result does not preclude SARS-Cov-2 infection and should not be used as the sole basis for treatment or other patient management decisions. A negative result may occur with  improper specimen collection/handling, submission of specimen other than nasopharyngeal swab, presence of viral mutation(s) within the areas targeted by this assay, and inadequate number of viral copies (<131 copies/mL). A negative result must be combined with clinical observations, patient history, and epidemiological information. The expected result is Negative. Fact Sheet for Patients:  PinkCheek.be Fact Sheet for Healthcare Providers:  GravelBags.it This test is not yet ap proved or cleared by the [place]  FDA and  has been authorized for detection and/or diagnosis of SARS-CoV-2 by FDA under an Emergency Use Authorization (EUA). This EUA will remain  in effect (meaning this test can be used) for the duration of the COVID-19 declaration under Section 564(b)(1) of the Act, 21 U.S.C. section 360bbb-3(b)(1), unless the authorization is terminated or revoked sooner.    Influenza A by PCR NEGATIVE NEGATIVE Final   Influenza B by PCR NEGATIVE NEGATIVE Final    Comment: (NOTE) The Xpert Xpress SARS-CoV-2/FLU/RSV assay is intended as an aid in  the diagnosis of influenza from Nasopharyngeal swab specimens and  should not be used as a sole basis for treatment. Nasal washings and  aspirates are unacceptable for Xpert Xpress SARS-CoV-2/FLU/RSV  testing. Fact Sheet for  Patients: https://www.moore.com/ Fact Sheet for Healthcare Providers: https://www.young.biz/ This test is not yet approved or cleared by the [place] FDA and  has been authorized for detection and/or diagnosis of SARS-CoV-2 by  FDA under an Emergency Use Authorization (EUA). This EUA will remain  in effect (meaning this test can be used) for the duration of the  Covid-19 declaration under Section 564(b)(1) of the Act, 21  U.S.C. section 360bbb-3(b)(1), unless the authorization is  terminated or revoked. Performed at [facility], [address] [place], [place] [address]   MRSA PCR Screening     Status: None   Collection Time: 05/30/19  7:31 PM   Specimen: Nasal Mucosa; Nasopharyngeal  Result Value Ref Range Status   MRSA by PCR NEGATIVE NEGATIVE Final    Comment:        The GeneXpert MRSA Assay (FDA approved for NASAL specimens only), is one component of a comprehensive MRSA colonization surveillance program. It is not intended to diagnose MRSA infection nor to guide or monitor treatment for MRSA infections. Performed at [facility], [address] [place], [place] [address]          Radiology Studies: EEG  Result Date: 05/31/2019 Charlsie Quest, MD     05/31/2019  2:25 PM Patient Name: Ian Gill MRN: [id number] Epilepsy Attending: Charlsie Quest Referring Physician/Provider: Dr. Brendia Sacks Date: 05/31/2019 Duration: 22.53 minutes Patient history: 74 year old male with past medical history of seizures presented with another seizure-like episode.  EEG to evaluate for seizures. Level of alertness: Awake, sleep AEDs during EEG study: Keppra, lorazepam Technical aspects: This EEG study was done with scalp electrodes positioned according to the 10-20 International system of electrode placement. Electrical activity was acquired at a sampling rate of 500Hz  and reviewed with a high frequency filter of 70Hz   and a low frequency filter of 1Hz . EEG data were recorded continuously and digitally stored. Description: The posterior dominant rhythm consists of 8-9 Hz activity of moderate voltage (25-35 uV) seen predominantly in posterior head regions, symmetric and reactive to eye opening and eye closing.  Sleep was characterized by vertex waves, sleep spindles (12 to 14 Hz), maximal frontocentral region.  EEG also showed generalized spike and polyspike and wave discharges. Hyperventilation photic summation were not performed.        Abnormality -Spikes and polyspikes, generalized IMPRESSION: This study showed evidence of generalized epilepsy. No seizures were seen throughout the recording. Priyanka        Scheduled Meds: . Chlorhexidine Gluconate Cloth  6 each Topical Daily  . clopidogrel  75 mg Oral Daily  . [START ON 06/02/2019] divalproex  750 mg Oral QHS   Or  . [START ON 06/02/2019] divalproex  1,000 mg Oral QHS  . enoxaparin (LOVENOX) injection  40 mg Subcutaneous Q24H  . lamoTRIgine  150 mg Oral BID  . [START ON 06/02/2019] LORazepam  0-4 mg Intravenous Q12H   Or  . [START ON 06/02/2019] LORazepam  0-4 mg Oral Q12H  . thiamine  100 mg Oral Daily   Or  . thiamine  100 mg Intravenous Daily   Continuous Infusions: . sodium chloride 75 mL/hr (06/01/19 0600)  . levETIRAcetam 1,500 mg (06/01/19 1603)     LOS: 1 day    Time spent: 35 minutes    Tresa Moore, MD [facility] Pager [phone]  If 7PM-7AM, please contact night-coverage 06/01/2019, 4:59 PM

## 2019-06-02 LAB — GLUCOSE, CAPILLARY
Glucose-Capillary: 85 mg/dL (ref 70–99)
Glucose-Capillary: 95 mg/dL (ref 70–99)
Glucose-Capillary: 155 mg/dL — ABNORMAL HIGH (ref 70–99)

## 2019-06-02 MED ORDER — DIVALPROEX SODIUM 500 MG PO DR TAB
750.0000 mg | DELAYED_RELEASE_TABLET | Freq: Every morning | ORAL | Status: DC
  Administered 2019-06-03 – 2019-06-04 (×2): 750 mg via ORAL
  Filled 2019-06-02 (×2): qty 1

## 2019-06-02 MED ORDER — DIVALPROEX SODIUM 500 MG PO DR TAB
1000.0000 mg | DELAYED_RELEASE_TABLET | Freq: Every day | ORAL | Status: DC
  Administered 2019-06-03: 21:00:00 1000 mg via ORAL
  Filled 2019-06-02 (×3): qty 2

## 2019-06-02 MED ORDER — AMLODIPINE BESYLATE 10 MG PO TABS
10.0000 mg | ORAL_TABLET | Freq: Every day | ORAL | Status: DC
  Administered 2019-06-02 – 2019-06-04 (×3): 10 mg via ORAL
  Filled 2019-06-02 (×3): qty 1

## 2019-06-02 NOTE — Progress Notes (Addendum)
PROGRESS NOTE    Ian Gill  ENI:[id number] DOB: 14-Dec-1945 DOA: 05/30/2019 PCP: Barbette Reichmann, MD   Brief Narrative:   74 year old man PMH seizure disorder status post stroke approximately 5 years ago, Parkinson's disease?, Subclinical COPD, presented to the emergency department after a seizure at independent living. Had a second seizure in the emergency department was referred for admission as he remained postictal.  5/5: Seen and examined.  More awake today.  Was answering my questions appropriately this morning.  Per nursing the patient has been somnolent throughout the day and has difficulty following commands.  Unable to do bedside swallow evaluation.  No seizure activity noted over interval.  Keppra dose increased per neurology.  I have restarted Lamictal and Depakote and we can restart these medications and the patient is able to reliably take p.o.  5/6: And examined.  Much more awake today.  Mentation appears at or near baseline.  Answering all questions appropriately.  Requested the mitts be removed.  Seen by speech pathology.  Diet advanced to dysphagia 3.  Stable for transfer to MedSurg unit.  Assessment & Plan:   Principal Problem:   Seizure (HCC) Active Problems:   Alcohol use   Post-ictal state (HCC)   Postictal state (HCC)  Seizure disorder with breakthrough seizures, postictal state.  Not clear whether the patient ran out of Depakote or not, but other medications include Lamictal and Keppra.  1 witnessed seizure in the emergency department spontaneously resolved.    1 seizure overnight after admission. Seems to be coming around and does follow some simple commands although he is still postictal. --Keppra increased to 1500 mg twice daily per neurology.   Plan: Continue Keppra at 1500 mg twice daily Restart Lamictal and Depakote  PT and OT consults  Daily alcohol use. Reportedly 2-3 drinks. Son does not think the patient would have quit drinking.  Withdrawal in the differential but does not seem likely. --No evidence of withdrawal.  Continue CIWA  Parkinson's disease? --Unclear diagnosis. Monitor clinically.  Resume propranolol when able.  Personally it seems like propranolol is for tremor.    DVT prophylaxis: Lovenox Code Status: Full Family Communication: [place] via phone [phone] Disposition Plan: Status is: Inpatient  Remains inpatient appropriate because:Inpatient level of care appropriate due to severity of illness   Dispo: The patient is from: Home              Anticipated d/c is to: ALF              Anticipated d/c date is: 2 days              Patient currently is not medically stable to d/c.   We are continue to titrate antiepileptic regimen.  Patient also may be having small element of continued alcohol withdrawal.  Requires continued inpatient management at this time.   Consultants:   Neurology   Procedures:   EEG  Antimicrobials:   none   Subjective: Seen and examined More awake this morning  Objective: Vitals:   06/02/19 1100 06/02/19 1200 06/02/19 1300 06/02/19 1400  BP: (!) 166/75     Pulse:  86  87  Resp: 20 18 (!) 23 (!) 23  Temp:      TempSrc:      SpO2:  91%  93%  Weight:      Height:        Intake/Output Summary (Last 24 hours) at 06/02/2019 1517 Last data filed at 06/02/2019 1400 Gross per 24  hour  Intake 2625.68 ml  Output 1250 ml  Net 1375.68 ml   Filed Weights   05/31/19 0500 06/01/19 0500 06/02/19 0500  Weight: 105.3 kg 105.3 kg 105.7 kg    Examination:  General exam: Appears lethargic, in no distress Respiratory system: Clear to auscultation. Respiratory effort normal. Cardiovascular system: S1 & S2 heard, RRR. No JVD, murmurs, rubs, gallops or clicks. No pedal edema. Gastrointestinal system: Abdomen is nondistended, soft and nontender. No organomegaly or masses felt. Normal bowel sounds heard. Central nervous system: Unable to assess Extremities: Symmetric  5 x 5 power. Skin: No rashes, lesions or ulcers Psychiatry: Unable to assess    Data Reviewed: I have personally reviewed following labs and imaging studies  CBC: Recent Labs  Lab 05/30/19 1425 05/31/19 0627  WBC 13.1* 12.4*  NEUTROABS 11.3* 8.7*  HGB 17.6* 15.3  HCT 47.2 42.6  MCV 92.0 94.0  PLT 173 166   Basic Metabolic Panel: Recent Labs  Lab 05/30/19 1425 05/31/19 0627  NA 135 135  K 3.8 3.4*  CL 100 102  CO2 24 25  GLUCOSE 130* 113*  BUN 15 11  CREATININE 0.82 0.80  CALCIUM 9.7 9.3   GFR: Estimated Creatinine Clearance: 94 mL/min (by C-G formula based on SCr of 0.8 mg/dL). Liver Function Tests: Recent Labs  Lab 05/30/19 1425  AST 62*  ALT 27  ALKPHOS 49  BILITOT 0.9  PROT 7.9  ALBUMIN 5.1*   No results for input(s): LIPASE, AMYLASE in the last 168 hours. No results for input(s): AMMONIA in the last 168 hours. Coagulation Profile: No results for input(s): INR, PROTIME in the last 168 hours. Cardiac Enzymes: No results for input(s): CKTOTAL, CKMB, CKMBINDEX, TROPONINI in the last 168 hours. BNP (last 3 results) No results for input(s): PROBNP in the last 8760 hours. HbA1C: No results for input(s): HGBA1C in the last 72 hours. CBG: Recent Labs  Lab 05/31/19 2346 06/01/19 0539 06/01/19 1350 06/01/19 2314 06/02/19 0541  GLUCAP 99 107* 121* 85 95   Lipid Profile: No results for input(s): CHOL, HDL, LDLCALC, TRIG, CHOLHDL, LDLDIRECT in the last 72 hours. Thyroid Function Tests: No results for input(s): TSH, T4TOTAL, FREET4, T3FREE, THYROIDAB in the last 72 hours. Anemia Panel: No results for input(s): VITAMINB12, FOLATE, FERRITIN, TIBC, IRON, RETICCTPCT in the last 72 hours. Sepsis Labs: No results for input(s): PROCALCITON, LATICACIDVEN in the last 168 hours.  Recent Results (from the past 240 hour(s))  Respiratory Panel by RT PCR (Flu A&B, Covid) - Nasopharyngeal Swab     Status: None   Collection Time: 05/30/19  3:44 PM   Specimen:  Nasopharyngeal Swab  Result Value Ref Range Status   SARS Coronavirus 2 by RT PCR NEGATIVE NEGATIVE Final    Comment: (NOTE) SARS-CoV-2 target nucleic acids are NOT DETECTED. The SARS-CoV-2 RNA is generally detectable in upper respiratoy specimens during the acute phase of infection. The lowest concentration of SARS-CoV-2 viral copies this assay can detect is 131 copies/mL. A negative result does not preclude SARS-Cov-2 infection and should not be used as the sole basis for treatment or other patient management decisions. A negative result may occur with  improper specimen collection/handling, submission of specimen other than nasopharyngeal swab, presence of viral mutation(s) within the areas targeted by this assay, and inadequate number of viral copies (<131 copies/mL). A negative result must be combined with clinical observations, patient history, and epidemiological information. The expected result is Negative. Fact Sheet for Patients:  https://www.moore.com/ Fact Sheet for Healthcare Providers:  GravelBags.it This test is not yet ap proved or cleared by the [place] and  has been authorized for detection and/or diagnosis of SARS-CoV-2 by FDA under an Emergency Use Authorization (EUA). This EUA will remain  in effect (meaning this test can be used) for the duration of the COVID-19 declaration under Section 564(b)(1) of the Act, 21 U.S.C. section 360bbb-3(b)(1), unless the authorization is terminated or revoked sooner.    Influenza A by PCR NEGATIVE NEGATIVE Final   Influenza B by PCR NEGATIVE NEGATIVE Final    Comment: (NOTE) The Xpert Xpress SARS-CoV-2/FLU/RSV assay is intended as an aid in  the diagnosis of influenza from Nasopharyngeal swab specimens and  should not be used as a sole basis for treatment. Nasal washings and  aspirates are unacceptable for Xpert Xpress SARS-CoV-2/FLU/RSV  testing. Fact Sheet for  Patients: PinkCheek.be Fact Sheet for Healthcare Providers: GravelBags.it This test is not yet approved or cleared by the [place] FDA and  has been authorized for detection and/or diagnosis of SARS-CoV-2 by  FDA under an Emergency Use Authorization (EUA). This EUA will remain  in effect (meaning this test can be used) for the duration of the  Covid-19 declaration under Section 564(b)(1) of the Act, 21  U.S.C. section 360bbb-3(b)(1), unless the authorization is  terminated or revoked. Performed at [facility], [address] [place], [place] [address]   MRSA PCR Screening     Status: None   Collection Time: 05/30/19  7:31 PM   Specimen: Nasal Mucosa; Nasopharyngeal  Result Value Ref Range Status   MRSA by PCR NEGATIVE NEGATIVE Final    Comment:        The GeneXpert MRSA Assay (FDA approved for NASAL specimens only), is one component of a comprehensive MRSA colonization surveillance program. It is not intended to diagnose MRSA infection nor to guide or monitor treatment for MRSA infections. Performed at [facility], [address] [place], [place] [address]          Radiology Studies: No results found.      Scheduled Meds: . amLODipine  10 mg Oral Daily  . Chlorhexidine Gluconate Cloth  6 each Topical Daily  . clopidogrel  75 mg Oral Daily  . divalproex  750 mg Oral QHS   Or  . divalproex  1,000 mg Oral QHS  . enoxaparin (LOVENOX) injection  40 mg Subcutaneous Q24H  . lamoTRIgine  150 mg Oral BID  . LORazepam  0-4 mg Intravenous Q12H   Or  . LORazepam  0-4 mg Oral Q12H  . thiamine  100 mg Oral Daily   Or  . thiamine  100 mg Intravenous Daily   Continuous Infusions: . levETIRAcetam 1,500 mg (06/02/19 0241)     LOS: 2 days    Time spent: 35 minutes    Sidney Ace, MD [facility] Pager [phone]  If 7PM-7AM, please contact  night-coverage 06/02/2019, 3:17 PM

## 2019-06-02 NOTE — Evaluation (Signed)
Clinical/Bedside Swallow Evaluation Patient Details  Name: Ian Gill MRN: [id number] Date of Birth: 04-12-1945  Today's Date: 06/02/2019 Time: SLP Start Time (ACUTE ONLY): 0910 SLP Stop Time (ACUTE ONLY): 0946 SLP Time Calculation (min) (ACUTE ONLY): 36 min  Past Medical History:  Past Medical History:  Diagnosis Date  . Anxiety   . COPD (chronic obstructive pulmonary disease) ([place])   . Depression   . Parkinson's disease ([place])   . Seizures ([place])   . Stroke [facility])    Past Surgical History:  Past Surgical History:  Procedure Laterality Date  . Shoulder surgery    . SPINAL FUSION     cervical  . TOTAL KNEE ARTHROPLASTY     HPI:  Ian Gill is an 74 y.o. male  PMH seizure disorder status post stroke approximately 5 years ago, l COPD, presented to the emergency department after a seizure at independent living.  Had a second seizure in the emergency department was referred for admission as he remained postictal. There was possible suspicion of ETOH use and possibly medication non compliance.  On Keppra, VPA and Lamictal at home.  Head CT was negative for acute intracranial abnormalities and showed stable, mild generalized parenchymal atrophy. Chest x-ray revealed interstitial thickening without edema or consolidation. Question a degree of underlying inflammatory change/fibrosis. No adenopathy appreciable.   Assessment / Plan / Recommendation Clinical Impression  Pt presents with adequate oropharyngeal abilities when consuming regular textures and thin liquids via cup (using single sips). Pt's oral phase was mildly increased d/t continuous talking. When consuming consecutive sips thin liquids via straw or cup, pt with immediate cough. Possible indication of delayed swallow initiation. Pt continues to present with confusion and inability to follow directions or be redirected to task. He is also unable to cease talking while consuming POs. Therefore recommend dysphagia 3 diet  (d/t incessant talking) with thin liquids via single cup sips. ST to follow for diet toleration and possibility of diet advancement.  SLP Visit Diagnosis: Dysphagia, unspecified (R13.10)    Aspiration Risk  Mild aspiration risk    Diet Recommendation   Dysphagia 3 with thin liquids via cup NO STRAW  Medication Administration: Whole meds with liquid    Other  Recommendations Oral Care Recommendations: Oral care BID   Follow up Recommendations Skilled Nursing facility      Frequency and Duration min 3x week  2 weeks       Prognosis Prognosis for Safe Diet Advancement: Good      Swallow Study   General Date of Onset: 06/02/19 HPI: Ian Gill is an 74 y.o. male  PMH seizure disorder status post stroke approximately 5 years ago, l COPD, presented to the emergency department after a seizure at independent living.  Had a second seizure in the emergency department was referred for admission as he remained postictal. There was possible suspicion of ETOH use and possibly medication non compliance.  On Keppra, VPA and Lamictal at home.  Head CT was negative for acute intracranial abnormalities and showed stable, mild generalized parenchymal atrophy. Chest x-ray revealed interstitial thickening without edema or consolidation. Question a degree of underlying inflammatory change/fibrosis. No adenopathy appreciable. Type of Study: Bedside Swallow Evaluation Previous Swallow Assessment: none in chart Diet Prior to this Study: NPO Temperature Spikes Noted: No Respiratory Status: Room air History of Recent Intubation: No Behavior/Cognition: Alert;Cooperative;Confused Oral Cavity Assessment: Within Functional Limits Oral Care Completed by SLP: No Oral Cavity - Dentition: Poor condition;Missing dentition Vision: Impaired for self-feeding Self-Feeding  Abilities: Needs assist;Needs set up Patient Positioning: Upright in bed Baseline Vocal Quality: Normal Volitional Cough:  Strong Volitional Swallow: Unable to elicit    Oral/Motor/Sensory Function Overall Oral Motor/Sensory Function: Within functional limits   Ice Chips Ice chips: Not tested   Thin Liquid Thin Liquid: Impaired Presentation: Self Fed;Cup;Straw Pharyngeal  Phase Impairments: Cough - Delayed(cough only when using straw)    Nectar Thick Nectar Thick Liquid: Not tested   Honey Thick Honey Thick Liquid: Not tested   Puree Puree: Within functional limits Presentation: Spoon   Solid    Symone Cornman B. [name] M.S., CCC-SLP, CBIS Speech-Language Pathologist Rehabilitation Services Office [phone]  Solid: Within functional limits Presentation: Self Fed Other Comments: mildy prolonged oral phase with regular d/t confused state      Sierrah Luevano 06/02/2019,12:46 PM

## 2019-06-02 NOTE — Progress Notes (Signed)
MEDICATION RELATED CONSULT NOTE  Pharmacy Consult for drug interaction and monitoring of AEDs Indication: seizures   Patient Measurements: Height: 5\' 8"  (172.7 cm) Weight: 105.7 kg (233 lb 0.4 oz) IBW/kg (Calculated) : 68.4  Vital Signs: Temp: 98.2 F (36.8 C) (05/06 0500) Temp Source: Oral (05/06 0500) BP: 180/93 (05/06 0600) Pulse Rate: 79 (05/06 0600) Intake/Output from previous day: 05/05 0701 - 05/06 0700 In: 1905.7 [I.V.:1805.7; IV Piggyback:100] Out: 1150 [Urine:1150] Intake/Output from this shift: Total I/O In: 360 [P.O.:360] Out: 600 [Urine:600]  Labs: Recent Labs    05/30/19 1425 05/31/19 0627  WBC 13.1* 12.4*  HGB 17.6* 15.3  HCT 47.2 42.6  PLT 173 166  CREATININE 0.82 0.80  ALBUMIN 5.1*  --   PROT 7.9  --   AST 62*  --   ALT 27  --   ALKPHOS 49  --   BILITOT 0.9  --    Estimated Creatinine Clearance: 94 mL/min (by C-G formula based on SCr of 0.8 mg/dL).    Assessment: 74 yo male here with seizure reported at independent living and a second seizure in the ED. No seizure activity noted over interval per attending.  Pharmacy consulted for drug interaction and monitoring of AEDs.   Current AED medications:  1) divalproex DR 1750 mg HS 2) lamotrigine 150 mg po BID 3) levetiracetam 1500 mg IV q 12h 4) lorazepam CIWA protocol  Plan:  DDI: Valproic Acid may increase plasma concentrations and pharmacologic effects of lamotrigine   Neurology is monitoring cognitive effects and advising to use the above therapy  continue to follow and monitor  61 06/02/2019,12:10 PM

## 2019-06-03 LAB — GLUCOSE, CAPILLARY
Glucose-Capillary: 102 mg/dL — ABNORMAL HIGH (ref 70–99)
Glucose-Capillary: 105 mg/dL — ABNORMAL HIGH (ref 70–99)
Glucose-Capillary: 154 mg/dL — ABNORMAL HIGH (ref 70–99)
Glucose-Capillary: 179 mg/dL — ABNORMAL HIGH (ref 70–99)

## 2019-06-03 MED ORDER — LISINOPRIL 20 MG PO TABS
20.0000 mg | ORAL_TABLET | Freq: Every day | ORAL | Status: DC
  Administered 2019-06-03 – 2019-06-04 (×2): 20 mg via ORAL
  Filled 2019-06-03 (×2): qty 1

## 2019-06-03 MED ORDER — ATORVASTATIN CALCIUM 20 MG PO TABS
20.0000 mg | ORAL_TABLET | Freq: Every day | ORAL | Status: DC
  Administered 2019-06-03 – 2019-06-04 (×2): 20 mg via ORAL
  Filled 2019-06-03 (×2): qty 1

## 2019-06-03 MED ORDER — LEVETIRACETAM 750 MG PO TABS
1500.0000 mg | ORAL_TABLET | Freq: Two times a day (BID) | ORAL | Status: DC
  Administered 2019-06-03 – 2019-06-04 (×3): 1500 mg via ORAL
  Filled 2019-06-03 (×4): qty 2

## 2019-06-03 MED ORDER — PROPRANOLOL HCL 20 MG PO TABS
20.0000 mg | ORAL_TABLET | Freq: Two times a day (BID) | ORAL | Status: DC
  Administered 2019-06-03 – 2019-06-04 (×3): 20 mg via ORAL
  Filled 2019-06-03 (×4): qty 1

## 2019-06-03 MED ORDER — SERTRALINE HCL 100 MG PO TABS
200.0000 mg | ORAL_TABLET | Freq: Every day | ORAL | Status: DC
  Administered 2019-06-03 – 2019-06-04 (×2): 200 mg via ORAL
  Filled 2019-06-03: qty 2
  Filled 2019-06-03: qty 4
  Filled 2019-06-03: qty 2
  Filled 2019-06-03: qty 4

## 2019-06-03 NOTE — Progress Notes (Signed)
OT Cancellation Note  Patient Details Name: Ian Gill MRN: [id number] DOB: [birth year]   Cancelled Treatment:    Reason Eval/Treat Not Completed: Fatigue/lethargy limiting ability to participate. OT order received and chart reviewed. Upon entry PT and NT at bed side attempting to awake pt. Per NSG, pt did not sleep well last night. Pt does not wake to tactile, auditory, or visual stimuli and snores t/o attempts to awaken. OT will re-attempt as available.    Kathie Dike, M.S. OTR/L  06/03/19, 9:33 AM

## 2019-06-03 NOTE — Evaluation (Signed)
Physical Therapy Evaluation Patient Details Name: Ian Gill MRN: [id number] DOB: [birth year] Today's Date: 06/03/2019   History of Present Illness  Pt is a 74 year old man PMH seizure disorder status post stroke approximately 5 years ago, Subclinical COPD, presented to the emergency department after a seizure at independent living.  Had a second seizure in the emergency department was referred for admission as he remained postictal.    Clinical Impression  Pt alert, agreeable to PT, oriented x4. Denied pain. Reported at baseline uses rollator, modI/I for ADLs, facility provides cleaning and meals (pt will go to dining hall). Endorses a couple of falls a week.  Supine to sit mod I, sit <> stand several times during session, supervision with RW. The patient ambulated ~66ft with RW and CGA/supervision, did exhibit some unsteadiness and 2 mild LOB but able to self correct. Pt stated he feels that he has gotten weaker over time, and that has concerns over his balance. Pt up in chair with all needs in reach.  Overall the patient demonstrated deficits (see "PT Problem List") that impede the patient's functional abilities, safety, and mobility and would benefit from skilled PT intervention. Recommendation is HHPT with intermittent supervision, pt educated on benefit of HHPT, pt very interested.     Follow Up Recommendations Home health PT;Supervision - Intermittent    Equipment Recommendations  None recommended by PT    Recommendations for Other Services       Precautions / Restrictions Precautions Precautions: None;Fall Restrictions Weight Bearing Restrictions: No      Mobility  Bed Mobility Overal bed mobility: Modified Independent                Transfers Overall transfer level: Needs assistance Equipment used: Rolling walker (2 wheeled) Transfers: Sit to/from Stand Sit to Stand: Supervision;Min guard         General transfer comment: reliant on UE to complete  transfer  Ambulation/Gait Ambulation/Gait assistance: Supervision Gait Distance (Feet): 50 Feet Assistive device: Rolling walker (2 wheeled)   Gait velocity: decreased   General Gait Details: pt able to ambulate ~64ft did exhibit 2 mild LOB but able to self correct.  Stairs            Wheelchair Mobility    Modified Rankin (Stroke Patients Only)       Balance Overall balance assessment: Needs assistance Sitting-balance support: Feet supported Sitting balance-Leahy Scale: Good       Standing balance-Leahy Scale: Fair Standing balance comment: reliant on UE support ambulation/dynamic activities                             Pertinent Vitals/Pain Pain Assessment: No/denies pain    Home Living Family/patient expects to be discharged to:: Private residence Living Arrangements: Alone Available Help at Discharge: Available PRN/intermittently Type of Home: Independent living facility Home Access: Level entry     Home Layout: One level Home Equipment: Grab bars - toilet;Grab bars - tub/shower;Walker - 2 wheels;Walker - 4 wheels;Hand held shower head;Shower seat;Cane - single point Additional Comments: endorses a couple of falls a week.    Prior Function Level of Independence: Independent with assistive device(s)         Comments: facility provideds cleaning, has dining room for meals. uses rollator for ambulation     Hand Dominance        Extremity/Trunk Assessment   Upper Extremity Assessment Upper Extremity Assessment: Generalized weakness    Lower Extremity Assessment  Lower Extremity Assessment: Generalized weakness    Cervical / Trunk Assessment Cervical / Trunk Assessment: Normal  Communication   Communication: No difficulties  Cognition Arousal/Alertness: Awake/alert Behavior During Therapy: WFL for tasks assessed/performed Overall Cognitive Status: Within Functional Limits for tasks assessed                                         General Comments      Exercises     Assessment/Plan    PT Assessment Patient needs continued PT services  PT Problem List Decreased strength;Decreased mobility;Decreased balance;Decreased activity tolerance;Decreased knowledge of use of DME       PT Treatment Interventions DME instruction;Therapeutic exercise;Gait training;Balance training;Neuromuscular re-education;Functional mobility training;Therapeutic activities;Patient/family education    PT Goals (Current goals can be found in the Care Plan section)  Acute Rehab PT Goals Patient Stated Goal: to get stronger PT Goal Formulation: With patient Time For Goal Achievement: 06/17/19    Frequency Min 2X/week   Barriers to discharge        Co-evaluation               AM-PAC PT "6 Clicks" Mobility  Outcome Measure Help needed turning from your back to your side while in a flat bed without using bedrails?: None Help needed moving from lying on your back to sitting on the side of a flat bed without using bedrails?: None Help needed moving to and from a bed to a chair (including a wheelchair)?: None Help needed standing up from a chair using your arms (e.g., wheelchair or bedside chair)?: None Help needed to walk in hospital room?: A Little Help needed climbing 3-5 steps with a railing? : A Little 6 Click Score: 22    End of Session Equipment Utilized During Treatment: Gait belt Activity Tolerance: Patient tolerated treatment well Patient left: in chair;with chair alarm set;with call bell/phone within reach Nurse Communication: Mobility status PT Visit Diagnosis: Muscle weakness (generalized) (M62.81);Other abnormalities of gait and mobility (R26.89);Difficulty in walking, not elsewhere classified (R26.2);History of falling (Z91.81)    Time: 8182-9937 PT Time Calculation (min) (ACUTE ONLY): 37 min   Charges:   PT Evaluation $PT Eval Low Complexity: 1 Low PT Treatments $Therapeutic Exercise:  23-37 mins        Lieutenant Diego PT, DPT 4:19 PM,06/03/19

## 2019-06-03 NOTE — Progress Notes (Signed)
SLP Cancellation Note  Patient Details Name: Ian Gill MRN: [id number] DOB: 1945-06-04   Cancelled treatment:       Reason Eval/Treat Not Completed: Fatigue/lethargy limiting ability to participate  Pt unable to arouse d/t lack of sleep throughout the night. ST to follow for diet tolerance on next available time.   [name] [name] M.S., CCC-SLP, [facility] Speech-Language Pathologist Rehabilitation Services Office [phone]     [name] 06/03/2019, 9:23 AM

## 2019-06-03 NOTE — Progress Notes (Signed)
PROGRESS NOTE    Ian Gill  WCB:[id number] DOB: September 02, 1945 DOA: 05/30/2019 PCP: Barbette Reichmann, MD   Brief Narrative:   74 year old man PMH seizure disorder status post stroke approximately 5 years ago, Parkinson's disease?, Subclinical COPD, presented to the emergency department after a seizure at independent living. Had a second seizure in the emergency department was referred for admission as he remained postictal.  5/5: Seen and examined.  More awake today.  Was answering my questions appropriately this morning.  Per nursing the patient has been somnolent throughout the day and has difficulty following commands.  Unable to do bedside swallow evaluation.  No seizure activity noted over interval.  Keppra dose increased per neurology.  I have restarted Lamictal and Depakote and we can restart these medications and the patient is able to reliably take p.o.  5/6: And examined.  Much more awake today.  Mentation appears at or near baseline.  Answering all questions appropriately.  Requested the mitts be removed.  Seen by speech pathology.  Diet advanced to dysphagia 3.  Stable for transfer to MedSurg unit.  5/7: Patient seen and examined.  Very sleepy lethargic this morning.  Snoring loudly.  Per sitter at bedside the patient did not sleep well last night.  He was unable to participate with PT/OT/ST today.  No agitation or attempts to get out of bed noted.  Assessment & Plan:   Principal Problem:   Seizure (HCC) Active Problems:   Alcohol use   Post-ictal state (HCC)   Postictal state (HCC)  Seizure disorder with breakthrough seizures, postictal state.  Not clear whether the patient ran out of Depakote or not, but other medications include Lamictal and Keppra.  1 witnessed seizure in the emergency department spontaneously resolved.    1 seizure overnight after admission. Seems to be coming around and does follow some simple commands although he is still postictal. --Keppra  increased to 1500 mg twice daily per neurology.   Plan: Continue Keppra at 1500 mg twice daily, change to PO Restart Lamictal and Depakote  PT and OT consults: Unable to evaluate today.  Daily alcohol use. Reportedly 2-3 drinks. Son does not think the patient would have quit drinking. Withdrawal in the differential but does not seem likely. --No evidence of withdrawal.  Continue CIWA  Parkinson's disease? --Unclear diagnosis. Monitor clinically.   -Home propranolol resumed  Hypertension Restart home regimen as patient is able to take p.o.    DVT prophylaxis: Lovenox Code Status: Full Family Communication: [place] via phone [phone] on 5/6 Disposition Plan: Status is: Inpatient  Remains inpatient appropriate because:Inpatient level of care appropriate due to severity of illness   Dispo: The patient is from: Home              Anticipated d/c is to: Home              Anticipated d/c date is: 1 day              Patient currently is not medically stable to d/c.   Transitioned off IV Keppra today.  Patient will need evaluations by physical therapy and Occupational Therapy prior to formulation of disposition plan.  They were unable to evaluate him today due to extreme lethargy.  Sitter discontinued today.   Consultants:   Neurology   Procedures:   EEG  Antimicrobials:   none   Subjective: Seen and examined Sleepy this morning Per bedside sitter patient did not sleep well last night  Objective: Vitals:  06/03/19 0458 06/03/19 0500 06/03/19 0800 06/03/19 1011  BP: (!) 161/72  (!) 176/64 (!) 147/37  Pulse:  84 66 76  Resp:  (!) 21 16   Temp: 98.6 F (37 C)  98.2 F (36.8 C)   TempSrc:   Axillary   SpO2:  96% 91% 100%  Weight:      Height:        Intake/Output Summary (Last 24 hours) at 06/03/2019 1040 Last data filed at 06/03/2019 0209 Gross per 24 hour  Intake 360 ml  Output 2325 ml  Net -1965 ml   Filed Weights   06/01/19 0500 06/02/19 0500  06/03/19 0212  Weight: 105.3 kg 105.7 kg 103.8 kg    Examination:  General exam: Appears lethargic, in no distress Respiratory system: Clear to auscultation. Respiratory effort normal. Cardiovascular system: S1 & S2 heard, RRR. No JVD, murmurs, rubs, gallops or clicks. No pedal edema. Gastrointestinal system: Abdomen is nondistended, soft and nontender. No organomegaly or masses felt. Normal bowel sounds heard. Central nervous system: Unable to assess Extremities: Symmetric 5 x 5 power. Skin: No rashes, lesions or ulcers Psychiatry: Unable to assess    Data Reviewed: I have personally reviewed following labs and imaging studies  CBC: Recent Labs  Lab 05/30/19 1425 05/31/19 0627  WBC 13.1* 12.4*  NEUTROABS 11.3* 8.7*  HGB 17.6* 15.3  HCT 47.2 42.6  MCV 92.0 94.0  PLT 173 166   Basic Metabolic Panel: Recent Labs  Lab 05/30/19 1425 05/31/19 0627  NA 135 135  K 3.8 3.4*  CL 100 102  CO2 24 25  GLUCOSE 130* 113*  BUN 15 11  CREATININE 0.82 0.80  CALCIUM 9.7 9.3   GFR: Estimated Creatinine Clearance: 93.2 mL/min (by C-G formula based on SCr of 0.8 mg/dL). Liver Function Tests: Recent Labs  Lab 05/30/19 1425  AST 62*  ALT 27  ALKPHOS 49  BILITOT 0.9  PROT 7.9  ALBUMIN 5.1*   No results for input(s): LIPASE, AMYLASE in the last 168 hours. No results for input(s): AMMONIA in the last 168 hours. Coagulation Profile: No results for input(s): INR, PROTIME in the last 168 hours. Cardiac Enzymes: No results for input(s): CKTOTAL, CKMB, CKMBINDEX, TROPONINI in the last 168 hours. BNP (last 3 results) No results for input(s): PROBNP in the last 8760 hours. HbA1C: No results for input(s): HGBA1C in the last 72 hours. CBG: Recent Labs  Lab 06/01/19 2314 06/02/19 0541 06/02/19 1753 06/03/19 0003 06/03/19 0443  GLUCAP 85 95 155* 105* 102*   Lipid Profile: No results for input(s): CHOL, HDL, LDLCALC, TRIG, CHOLHDL, LDLDIRECT in the last 72 hours. Thyroid  Function Tests: No results for input(s): TSH, T4TOTAL, FREET4, T3FREE, THYROIDAB in the last 72 hours. Anemia Panel: No results for input(s): VITAMINB12, FOLATE, FERRITIN, TIBC, IRON, RETICCTPCT in the last 72 hours. Sepsis Labs: No results for input(s): PROCALCITON, LATICACIDVEN in the last 168 hours.  Recent Results (from the past 240 hour(s))  Respiratory Panel by RT PCR (Flu A&B, Covid) - Nasopharyngeal Swab     Status: None   Collection Time: 05/30/19  3:44 PM   Specimen: Nasopharyngeal Swab  Result Value Ref Range Status   SARS Coronavirus 2 by RT PCR NEGATIVE NEGATIVE Final    Comment: (NOTE) SARS-CoV-2 target nucleic acids are NOT DETECTED. The SARS-CoV-2 RNA is generally detectable in upper respiratoy specimens during the acute phase of infection. The lowest concentration of SARS-CoV-2 viral copies this assay can detect is 131 copies/mL. A negative result does  not preclude SARS-Cov-2 infection and should not be used as the sole basis for treatment or other patient management decisions. A negative result may occur with  improper specimen collection/handling, submission of specimen other than nasopharyngeal swab, presence of viral mutation(s) within the areas targeted by this assay, and inadequate number of viral copies (<131 copies/mL). A negative result must be combined with clinical observations, patient history, and epidemiological information. The expected result is Negative. Fact Sheet for Patients:  https://www.moore.com/ Fact Sheet for Healthcare Providers:  https://www.young.biz/ This test is not yet ap proved or cleared by the [place] FDA and  has been authorized for detection and/or diagnosis of SARS-CoV-2 by FDA under an Emergency Use Authorization (EUA). This EUA will remain  in effect (meaning this test can be used) for the duration of the COVID-19 declaration under Section 564(b)(1) of the Act, 21 U.S.C. section  360bbb-3(b)(1), unless the authorization is terminated or revoked sooner.    Influenza A by PCR NEGATIVE NEGATIVE Final   Influenza B by PCR NEGATIVE NEGATIVE Final    Comment: (NOTE) The Xpert Xpress SARS-CoV-2/FLU/RSV assay is intended as an aid in  the diagnosis of influenza from Nasopharyngeal swab specimens and  should not be used as a sole basis for treatment. Nasal washings and  aspirates are unacceptable for Xpert Xpress SARS-CoV-2/FLU/RSV  testing. Fact Sheet for Patients: https://www.moore.com/ Fact Sheet for Healthcare Providers: https://www.young.biz/ This test is not yet approved or cleared by the [place] FDA and  has been authorized for detection and/or diagnosis of SARS-CoV-2 by  FDA under an Emergency Use Authorization (EUA). This EUA will remain  in effect (meaning this test can be used) for the duration of the  Covid-19 declaration under Section 564(b)(1) of the Act, 21  U.S.C. section 360bbb-3(b)(1), unless the authorization is  terminated or revoked. Performed at [facility], [address] [place], [place] [address]   MRSA PCR Screening     Status: None   Collection Time: 05/30/19  7:31 PM   Specimen: Nasal Mucosa; Nasopharyngeal  Result Value Ref Range Status   MRSA by PCR NEGATIVE NEGATIVE Final    Comment:        The GeneXpert MRSA Assay (FDA approved for NASAL specimens only), is one component of a comprehensive MRSA colonization surveillance program. It is not intended to diagnose MRSA infection nor to guide or monitor treatment for MRSA infections. Performed at [facility], [address], [place], [place] [address]          Radiology Studies: No results found.      Scheduled Meds: . amLODipine  10 mg Oral Daily  . atorvastatin  20 mg Oral Daily  . Chlorhexidine Gluconate Cloth  6 each Topical Daily  . clopidogrel  75 mg Oral Daily  . divalproex  1,000 mg Oral QHS    . divalproex  750 mg Oral q morning - 10a  . enoxaparin (LOVENOX) injection  40 mg Subcutaneous Q24H  . lamoTRIgine  150 mg Oral BID  . levETIRAcetam  1,500 mg Oral BID  . lisinopril  20 mg Oral Daily  . LORazepam  0-4 mg Intravenous Q12H   Or  . LORazepam  0-4 mg Oral Q12H  . propranolol  20 mg Oral BID  . sertraline  200 mg Oral Daily  . thiamine  100 mg Oral Daily   Or  . thiamine  100 mg Intravenous Daily   Continuous Infusions:    LOS: 3 days  Time spent: 35 minutes    Sidney Ace, MD [facility] Pager [phone]  If 7PM-7AM, please contact night-coverage 06/03/2019, 10:40 AM

## 2019-06-03 NOTE — Progress Notes (Addendum)
PT Cancellation Note  Patient Details Name: Ian Gill MRN: [id number] DOB: Jun 18, 1945   Cancelled Treatment:    Reason Eval/Treat Not Completed: Other (comment). Pt sleeping soundly upon PT entering room. Sitter informed PT that the pt did not get any sleep last night, and has been sleeping all morning. Pt momentarily half opened his eyes with repositioning, lights on, and tactile cueing, but unable to fully wake. PT to reattempt as able.   Olga Coaster PT, DPT 9:03 AM,06/03/19

## 2019-06-03 NOTE — Progress Notes (Signed)
MEDICATION RELATED CONSULT NOTE  Pharmacy Consult for drug interaction and monitoring of AEDs Indication: seizures   Patient Measurements: Height: 5\' 8"  (172.7 cm) Weight: 103.8 kg (228 lb 13.4 oz) IBW/kg (Calculated) : 68.4  Vital Signs: Temp: 98.2 F (36.8 C) (05/07 0800) Temp Source: Axillary (05/07 0800) BP: 122/66 (05/07 1150) Pulse Rate: 76 (05/07 1011) Intake/Output from previous day: 05/06 0701 - 05/07 0700 In: 720 [P.O.:720] Out: 2575 [Urine:2575] Intake/Output from this shift: Total I/O In: 400 [P.O.:400] Out: 200 [Urine:200]  Labs: No results for input(s): WBC, HGB, HCT, PLT, APTT, CREATININE, LABCREA, CREATININE, CREAT24HRUR, MG, PHOS, ALBUMIN, PROT, ALBUMIN, AST, ALT, ALKPHOS, BILITOT, BILIDIR, IBILI in the last 72 hours. Estimated Creatinine Clearance: 93.2 mL/min (by C-G formula based on SCr of 0.8 mg/dL).    Assessment: 74 yo male here with seizure reported at independent living and a second seizure in the ED. No seizure activity noted over interval per attending.  Pharmacy consulted for drug interaction and monitoring of AEDs.   Current AED medications:  1) divalproex DR 1000 mg HS, 750 mg qam 2) lamotrigine 150 mg po BID 3) levetiracetam 1500 mg PO BID 4) lorazepam CIWA protocol  Plan:  DDI: Valproic Acid may increase plasma concentrations and pharmacologic effects of lamotrigine   Neurology is monitoring cognitive effects and advising to use the above therapy  continue to follow and monitor  5/7 pt sleepy and lethargic this am.f/u   Hafsah Hendler A 06/03/2019,1:10 PM

## 2019-06-04 LAB — BASIC METABOLIC PANEL
Anion gap: 9 (ref 5–15)
BUN: 14 mg/dL (ref 8–23)
CO2: 26 mmol/L (ref 22–32)
Calcium: 9 mg/dL (ref 8.9–10.3)
Chloride: 103 mmol/L (ref 98–111)
Creatinine, Ser: 0.79 mg/dL (ref 0.61–1.24)
GFR calc Af Amer: >60 mL/min (ref >60)
GFR calc non Af Amer: >60 mL/min (ref >60)
Glucose, Bld: 96 mg/dL (ref 70–99)
Potassium: 3.6 mmol/L (ref 3.5–5.1)
Sodium: 138 mmol/L (ref 135–145)

## 2019-06-04 LAB — MAGNESIUM: Magnesium: 1.8 mg/dL (ref 1.7–2.4)

## 2019-06-04 LAB — GLUCOSE, CAPILLARY
Glucose-Capillary: 109 mg/dL — ABNORMAL HIGH (ref 70–99)
Glucose-Capillary: 85 mg/dL (ref 70–99)
Glucose-Capillary: 88 mg/dL (ref 70–99)

## 2019-06-04 MED ORDER — LEVETIRACETAM 500 MG PO TABS: 1500.0000 mg | ORAL_TABLET | Freq: Two times a day (BID) | ORAL | 0 refills | Status: DC

## 2019-06-04 MED ORDER — LISINOPRIL 20 MG PO TABS: 20.0000 mg | ORAL_TABLET | Freq: Every day | ORAL | 0 refills | Status: DC

## 2019-06-04 MED ORDER — AMLODIPINE BESYLATE 10 MG PO TABS: 10.0000 mg | ORAL_TABLET | Freq: Every day | ORAL | 0 refills | Status: DC

## 2019-06-04 MED ORDER — LAMOTRIGINE 150 MG PO TABS: 150.0000 mg | ORAL_TABLET | Freq: Two times a day (BID) | ORAL | 0 refills | Status: DC

## 2019-06-04 MED ORDER — DIVALPROEX SODIUM 500 MG PO DR TAB: 1000.0000 mg | DELAYED_RELEASE_TABLET | Freq: Every day | ORAL | 0 refills | Status: DC

## 2019-06-04 MED ORDER — DIVALPROEX SODIUM 250 MG PO DR TAB: 750.0000 mg | DELAYED_RELEASE_TABLET | Freq: Every morning | ORAL | 0 refills | Status: DC

## 2019-06-04 NOTE — Progress Notes (Signed)
Pt's son called the floor to advise that he was at the [facility]; pt discharged via wheelchair by nursing to the [facility] [place] entrance

## 2019-06-04 NOTE — Evaluation (Signed)
Occupational Therapy Evaluation Patient Details Name: Ian Gill MRN: [id number] DOB: November 03, 1945 Today's Date: 06/04/2019    History of Present Illness Pt is a 74 year old man PMH seizure disorder status post stroke approximately 5 years ago, Subclinical COPD, presented to the emergency department after a seizure at independent living.  Had a second seizure in the emergency department was referred for admission as he remained postictal.   Clinical Impression   Mr Bolander was seen for OT evaluation this date. Prior to hospital admission, pt was MOD I for mobility and ADLs using Rollator. Pt lives in [place] c assist for IADLs and pt endorses history of 6 falls in the last month. Pt presents to acute OT demonstrating impaired ADL performance and functional mobility 2/2 decreased safety awareness, functional balance/strength deficits, and decreased activity tolerance. Pt currently requires MOD I for increased time to don/doff B socks seated EOB. CGA + RW + VCs for simulated toilet t/f to standard commode.  Pt would benefit from skilled OT to address noted impairments and functional limitations (see below for any additional details) in order to maximize safety and independence while minimizing falls risk and caregiver burden. Upon hospital discharge, recommend HHOT to maximize pt safety and return to functional independence during meaningful occupations of daily life.     Follow Up Recommendations  Home health OT;Supervision - Intermittent    Equipment Recommendations  None recommended by OT    Recommendations for Other Services       Precautions / Restrictions Precautions Precautions: Fall Restrictions Weight Bearing Restrictions: No      Mobility Bed Mobility Overal bed mobility: Modified Independent             General bed mobility comments: HOB elevated and increased time sup>sit   Transfers Overall transfer level: Needs assistance Equipment used: Rolling walker (2  wheeled) Transfers: Sit to/from Stand Sit to Stand: Min guard         General transfer comment: CGA and RW stabilizatin sit>stand at EOB. SBA + RW stand>sit at chair c VCs for safe t/f technique     Balance Overall balance assessment: Needs assistance Sitting-balance support: Feet supported Sitting balance-Leahy Scale: Good     Standing balance support: Bilateral upper extremity supported Standing balance-Leahy Scale: Fair                             ADL either performed or assessed with clinical judgement   ADL Overall ADL's : Needs assistance/impaired                                       General ADL Comments: MOD I for increased time to don/doff B socks seated EOB. SETUP self-feeding/drinking at bed level. CGA + RW simulated toilet t/f      Vision         Perception     Praxis      Pertinent Vitals/Pain Pain Assessment: No/denies pain     Hand Dominance     Extremity/Trunk Assessment Upper Extremity Assessment Upper Extremity Assessment: Generalized weakness   Lower Extremity Assessment Lower Extremity Assessment: Generalized weakness   Cervical / Trunk Assessment Cervical / Trunk Assessment: Normal   Communication Communication Communication: No difficulties   Cognition Arousal/Alertness: Awake/alert Behavior During Therapy: WFL for tasks assessed/performed Overall Cognitive Status: Within Functional Limits for tasks assessed  General Comments       Exercises Exercises: Other exercises Other Exercises Other Exercises: Pt educated re: falls prevention, energy conservation, home/routines modifications, OT role in acute setting, DME recs, d/c recs Other Exercises: Self-feeding/drinking, simulated UBD, don/doff socks, sitting/standing balance/tolerance, bed mobility, sup>sit, sit<>stand, simulated toilet t/f    Shoulder Instructions      Home Living Family/patient  expects to be discharged to:: Private residence Living Arrangements: Alone Available Help at Discharge: Available PRN/intermittently Type of Home: Independent living facility Home Access: Level entry     Home Layout: One level     Bathroom Shower/Tub: Walk-in shower;Curtain;Tub/shower unit(unclear tub vs walk in)   [employer]: Standard     Home Equipment: Grab bars - toilet;Grab bars - tub/shower;Walker - 2 wheels;Walker - 4 wheels;Hand held shower head;Shower seat;Cane - single point   Additional Comments: Pt reports half a dozen falls in the last month       Prior Functioning/Environment Level of Independence: Independent with assistive device(s)        Comments: facility provideds cleaning, has dining room for meals. uses rollator for ambulation endorses falls c rollator        OT Problem List: Decreased strength;Decreased activity tolerance;Impaired balance (sitting and/or standing);Decreased safety awareness;Decreased knowledge of use of DME or AE      OT Treatment/Interventions: Self-care/ADL training;Therapeutic exercise;Neuromuscular education;Energy conservation;DME and/or AE instruction;Therapeutic activities;Patient/family education;Balance training    OT Goals(Current goals can be found in the care plan section) Acute Rehab OT Goals Patient Stated Goal: to get stronger OT Goal Formulation: With patient Time For Goal Achievement: 06/18/19 Potential to Achieve Goals: Good ADL Goals Pt Will Perform Grooming: with modified independence;standing Pt Will Transfer to Toilet: with modified independence;ambulating;regular height toilet(c LRAD PRN) Pt Will Perform Toileting - Clothing Manipulation and hygiene: with modified independence;sit to/from stand(c LRAD PRN) Additional ADL Goal #1: Pt will Independently verbalize plan to implement x3 falls prevention strategies  OT Frequency: Min 1X/week   Barriers to D/C: Decreased caregiver support           Co-evaluation              AM-PAC OT "6 Clicks" Daily Activity     Outcome Measure Help from another person eating meals?: None Help from another person taking care of personal grooming?: None Help from another person toileting, which includes using toliet, bedpan, or urinal?: A Little Help from another person bathing (including washing, rinsing, drying)?: A Little Help from another person to put on and taking off regular upper body clothing?: None Help from another person to put on and taking off regular lower body clothing?: A Little 6 Click Score: 21   End of Session Equipment Utilized During Treatment: Rolling walker  Activity Tolerance: Patient tolerated treatment well Patient left: in chair;with call bell/phone within reach;with chair alarm set;with nursing/sitter in room  OT Visit Diagnosis: Other abnormalities of gait and mobility (R26.89);Repeated falls (R29.6)                Time: 4627-0350 OT Time Calculation (min): 23 min Charges:  OT General Charges $OT Visit: 1 Visit OT Evaluation $OT Eval Low Complexity: 1 Low OT Treatments $Self Care/Home Management : 8-22 mins  Kathie Dike, M.S. OTR/L  06/04/19, 9:53 AM

## 2019-06-04 NOTE — Discharge Instructions (Signed)
[facility] Physical Therapy, Occupational Therapy and Registered Nursing services.

## 2019-06-04 NOTE — TOC Transition Note (Signed)
Transition of Care [facility]) - CM/SW Discharge Note   Patient Details  Name: Ian Gill MRN: [id number] Date of Birth: [birth year]  Transition of Care [facility]) CM/SW Contact:  Maud Deed, LCSW Phone Number: 06/04/2019, 12:58 PM   Clinical Narrative:    Pt medically stable for discharge per MD. CSW contacted pt's son to notify him of PT and OT recommendations, he was agreeable to [facility] services. Pt will return to [facility] today and will be transported by his son Loraine Leriche. CSW contacted [place] and they are able to accept. [facility] to follow for PT, OT and Nursing.     Final next level of care: Home w Home Health Services Barriers to Discharge: No Barriers Identified   Patient Goals and CMS Choice        Discharge Placement                Patient to be transferred to facility by: Son Name of family member notified: Loraine Leriche Patient and family notified of of transfer: 06/04/19  Discharge Plan and Services                          HH Arranged: RN, PT, OT [place] Agency: [facility] Date HH Agency Contacted: 06/04/19 Time HH Agency Contacted: [phone] Representative spoke with at [facility] Agency: Cipriano Bunker  Social Determinants of Health (SDOH) Interventions     Readmission Risk Interventions No flowsheet data found.

## 2019-06-04 NOTE — Plan of Care (Signed)

## 2019-06-04 NOTE — Progress Notes (Signed)
MD order received to discharge pt back to [facility] with Home Health PT, OT and RN services; [facility] previously established Home Health services with [facility]; verbally reviewed AVS with pt, gave printed Rx to pt; no questions voiced at this time; called pt's son, Loraine Leriche to advise him the pt is ready for discharge; Loraine Leriche to come to the [facility] entrance and call the floor in order for staff to bring him out in a wheelchair; discharge pending arrival of his son

## 2019-06-04 NOTE — Discharge Summary (Signed)
Physician Discharge Summary  Ian Gill YQM:[id number] DOB: 17-Jun-1945 DOA: 05/30/2019  PCP: Barbette Reichmann, MD  Admit date: 05/30/2019 Discharge date: 06/04/2019  Admitted From: Home Disposition:  Home with home health  Recommendations for Outpatient Follow-up:  1. Follow up with PCP in 1-2 weeks 2. Follow-up with outpatient neurologist  Home Health: Yes home health PT OT and skilled nursing Equipment/Devices: None  Discharge Condition: Stable CODE STATUS: Full Diet recommendation: Heart Healthy / Carb Modified  Brief/Interim Summary: 74 year old man PMH seizure disorder status post stroke approximately 5 years ago, Parkinson's disease?, Subclinical COPD, presented to the emergency department after a seizure at independent living. Had a second seizure in the emergency department was referred for admission as he remained postictal.  5/5: Seen and examined.  More awake today.  Was answering my questions appropriately this morning.  Per nursing the patient has been somnolent throughout the day and has difficulty following commands.  Unable to do bedside swallow evaluation.  No seizure activity noted over interval.  Keppra dose increased per neurology.  I have restarted Lamictal and Depakote and we can restart these medications and the patient is able to reliably take p.o.  5/6: And examined.  Much more awake today.  Mentation appears at or near baseline.  Answering all questions appropriately.  Requested the mitts be removed.  Seen by speech pathology.  Diet advanced to dysphagia 3.  Stable for transfer to MedSurg unit.  5/7: Patient seen and examined.  Very sleepy lethargic this morning.  Snoring loudly.  Per sitter at bedside the patient did not sleep well last night.  He was unable to participate with PT/OT/ST today.  No agitation or attempts to get out of bed noted.  5/8: Patient seen and examined.  Much more awake this morning.  Mentating clearly.  Answers questions  appropriately.  No seizure activity noted.  Medically stable for discharge home.  Lengthy conversation with patient's son via phone.  Patient son indicated that patient is a daily drinker.  Will educate patient on alcohol cessation.  Home antiepileptic regimen refilled.  Home antihypertensive regimen adjusted at time of discharge.  Patient is instructed to follow-up with outpatient PCP and neurology post discharge.   Discharge Diagnoses:  Principal Problem:   Seizure [facility]) Active Problems:   Alcohol use   Post-ictal state (HCC)   Postictal state (HCC) Seizure disorder with breakthrough seizures, postictal state.  Not clear whether the patient ran out of Depakote or not, but other medications include Lamictal and Keppra.  1 witnessed seizure in the emergency department spontaneously resolved. 1 seizure overnight after admission. Seems to be coming around and does follow some simple commands although he is still postictal. --Keppra increased to 1500 mg twice daily per neurology.  Discharge recommendations: Continue Keppra 1500 mg twice daily Lamotrigine 50 mg twice daily Depakote 750 mg every morning, 1000 mg every afternoon Encourage alcohol cessation Home physical therapy and occupational therapy as well skilled nursing   Daily alcohol use.  Reportedly 2-3 drinks.  Son does not think the patient would have quit drinking.  Withdrawal in the differential but does not seem likely. On CIWA protocol for duration of hospital course No evidence of withdrawal Encourage alcohol cessation   Parkinson's disease? --Unclear diagnosis. Monitor clinically. -Home propranolol resumed Outpatient neurology follow-up  Hypertension Home antihypertensive regimen adjusted.  Increased dose of amlodipine to 10 mg daily.  Decrease dose of lisinopril to 20 mg daily.   Discharge Instructions  Discharge Instructions    Diet -  low sodium heart healthy   Complete by: As directed     Increase activity slowly   Complete by: As directed      Allergies as of 06/04/2019      Reactions   Dilantin [phenytoin Sodium Extended]    Past issues with toxicity per pt's son   Wellbutrin [bupropion] Other (See Comments)   Seizures      Medication List    STOP taking these medications   atenolol 50 MG tablet Commonly known as: TENORMIN   Omega 3 1000 MG Caps   Vitamin D (Cholecalciferol) 25 MCG (1000 UT) Tabs     TAKE these medications   amLODipine 10 MG tablet Commonly known as: NORVASC Take 1 tablet (10 mg total) by mouth daily. Start taking on: Jun 05, 2019 What changed:   medication strength  how much to take   atorvastatin 20 MG tablet Commonly known as: LIPITOR Take 1 tablet (20 mg total) by mouth daily.   clopidogrel 75 MG tablet Commonly known as: PLAVIX Take 1 tablet (75 mg total) by mouth daily.   divalproex 500 MG DR tablet Commonly known as: DEPAKOTE Take 2 tablets (1,000 mg total) by mouth at bedtime. What changed: You were already taking a medication with the same name, and this prescription was added. Make sure you understand how and when to take each.   divalproex 250 MG DR tablet Commonly known as: DEPAKOTE Take 3 tablets (750 mg total) by mouth every morning. Start taking on: Jun 05, 2019 What changed:   how much to take  when to take this  additional instructions   lamoTRIgine 150 MG tablet Commonly known as: LAMICTAL Take 1 tablet (150 mg total) by mouth 2 (two) times daily. What changed: medication strength   levETIRAcetam 500 MG tablet Commonly known as: KEPPRA Take 3 tablets (1,500 mg total) by mouth 2 (two) times daily.   lisinopril 20 MG tablet Commonly known as: ZESTRIL Take 1 tablet (20 mg total) by mouth daily. Start taking on: Jun 05, 2019 What changed:   medication strength  how much to take   Melatonin 10 MG Tabs Take 1 tablet by mouth at bedtime.   propranolol 20 MG tablet Commonly known as:  INDERAL Take 20 mg by mouth 2 (two) times daily.   ranitidine 150 MG tablet Commonly known as: ZANTAC Take 1 tablet (150 mg total) by mouth daily.   sertraline 100 MG tablet Commonly known as: ZOLOFT Take 1.5 tablets (150 mg total) by mouth daily. What changed: how much to take       Allergies  Allergen Reactions  . Dilantin [Phenytoin Sodium Extended]     Past issues with toxicity per pt's son  . Wellbutrin [Bupropion] Other (See Comments)    Seizures     Consultations:  Neurology   Procedures/Studies: EEG  Result Date: 05/31/2019 Charlsie QuestYadav, Priyanka O, MD     05/31/2019  2:25 PM Patient Name: Ian Gill MRN: [id number] Epilepsy Attending: Charlsie QuestPriyanka O Yadav Referring Physician/Provider: Dr. Brendia Sacksaniel Goodrich Date: 05/31/2019 Duration: 22.53 minutes Patient history: 74 year old male with past medical history of seizures presented with another seizure-like episode.  EEG to evaluate for seizures. Level of alertness: Awake, sleep AEDs during EEG study: Keppra, lorazepam Technical aspects: This EEG study was done with scalp electrodes positioned according to the 10-20 International system of electrode placement. Electrical activity was acquired at a sampling rate of 500Hz  and reviewed with a high frequency filter of 70Hz  and a low frequency  filter of 1Hz . EEG data were recorded continuously and digitally stored. Description: The posterior dominant rhythm consists of 8-9 Hz activity of moderate voltage (25-35 uV) seen predominantly in posterior head regions, symmetric and reactive to eye opening and eye closing.  Sleep was characterized by vertex waves, sleep spindles (12 to 14 Hz), maximal frontocentral region.  EEG also showed generalized spike and polyspike and wave discharges. Hyperventilation photic summation were not performed.        Abnormality -Spikes and polyspikes, generalized IMPRESSION: This study showed evidence of generalized epilepsy. No seizures were seen throughout the  recording.   CT Head Wo Contrast  Result Date: 05/30/2019 CLINICAL DATA:  Seizure, nontraumatic. Additional history provided: Seizure with fall today, history of seizures. EXAM: CT HEAD WITHOUT CONTRAST TECHNIQUE: Contiguous axial images were obtained from the base of the skull through the vertex without intravenous contrast. COMPARISON:  Head CT 07/11/2015 FINDINGS: Brain: Stable, mild generalized parenchymal atrophy. There is no acute intracranial hemorrhage. No demarcated cortical infarct. No extra-axial fluid collection. No evidence of intracranial mass. No midline shift. Vascular: No hyperdense vessel.  Atherosclerotic calcifications. Skull: Normal. Negative for fracture or focal lesion. Sinuses/Orbits: Visualized orbits show no acute finding. Mild ethmoid and sphenoid sinus mucosal thickening at the imaged levels. No significant mastoid effusion. IMPRESSION: No evidence of acute intracranial abnormality. Stable, mild generalized parenchymal atrophy. Mild ethmoid and sphenoid sinus mucosal thickening at the imaged levels. Electronically Signed   By: 07/13/2015 DO   On: 05/30/2019 15:06   DG Chest Portable 1 View  Result Date: 05/30/2019 CLINICAL DATA:  Syncope EXAM: PORTABLE CHEST 1 VIEW COMPARISON:  January 23, 2014 FINDINGS: There is mild interstitial thickening. There is no frank edema or consolidation. Heart is upper normal in size with pulmonary vascularity normal. No adenopathy. 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 There is degenerative change in the left shoulder. There is postoperative change in the mid to lower cervical spine. IMPRESSION: Interstitial thickening without edema or consolidation. Question a degree of underlying inflammatory change/fibrosis. Heart upper normal in size. No adenopathy appreciable. Electronically Signed   By: January 25, 2014 III M.D.   On: 05/30/2019 14:52    (Echo, Carotid, EGD, Colonoscopy, ERCP)    Subjective: Patient seen  and examined on day of discharge No seizure activity noted over interval Patient stable, mentating clearly Medically ready for discharge home  Discharge Exam: Vitals:   06/04/19 0000 06/04/19 0800  BP: (!) 149/71 (!) 141/68  Pulse: 62 63  Resp: 17   Temp: 98.7 F (37.1 C) 98.5 F (36.9 C)  SpO2: 96% 94%   Vitals:   06/03/19 2059 06/04/19 0000 06/04/19 0500 06/04/19 0800  BP: (!) 143/69 (!) 149/71  (!) 141/68  Pulse: 72 62  63  Resp:  17    Temp:  98.7 F (37.1 C)  98.5 F (36.9 C)  TempSrc:  Oral  Oral  SpO2:  96%  94%  Weight:   103.8 kg   Height:        General: Pt is alert, awake, not in acute distress Cardiovascular: RRR, S1/S2 +, no rubs, no gallops Respiratory: CTA bilaterally, no wheezing, no rhonchi Abdominal: Soft, NT, ND, bowel sounds + Extremities: no edema, no cyanosis    The results of significant diagnostics from this hospitalization (including imaging, microbiology, ancillary and laboratory) are listed below for reference.     Microbiology: Recent Results (from the past 240 hour(s))  Respiratory Panel by RT PCR (  Flu A&B, Covid) - Nasopharyngeal Swab     Status: None   Collection Time: 05/30/19  3:44 PM   Specimen: Nasopharyngeal Swab  Result Value Ref Range Status   SARS Coronavirus 2 by RT PCR NEGATIVE NEGATIVE Final    Comment: (NOTE) SARS-CoV-2 target nucleic acids are NOT DETECTED. The SARS-CoV-2 RNA is generally detectable in upper respiratoy specimens during the acute phase of infection. The lowest concentration of SARS-CoV-2 viral copies this assay can detect is 131 copies/mL. A negative result does not preclude SARS-Cov-2 infection and should not be used as the sole basis for treatment or other patient management decisions. A negative result may occur with  improper specimen collection/handling, submission of specimen other than nasopharyngeal swab, presence of viral mutation(s) within the areas targeted by this assay, and inadequate  number of viral copies (<131 copies/mL). A negative result must be combined with clinical observations, patient history, and epidemiological information. The expected result is Negative. Fact Sheet for Patients:  PinkCheek.be Fact Sheet for Healthcare Providers:  GravelBags.it This test is not yet ap proved or cleared by the [place] FDA and  has been authorized for detection and/or diagnosis of SARS-CoV-2 by FDA under an Emergency Use Authorization (EUA). This EUA will remain  in effect (meaning this test can be used) for the duration of the COVID-19 declaration under Section 564(b)(1) of the Act, 21 U.S.C. section 360bbb-3(b)(1), unless the authorization is terminated or revoked sooner.    Influenza A by PCR NEGATIVE NEGATIVE Final   Influenza B by PCR NEGATIVE NEGATIVE Final    Comment: (NOTE) The Xpert Xpress SARS-CoV-2/FLU/RSV assay is intended as an aid in  the diagnosis of influenza from Nasopharyngeal swab specimens and  should not be used as a sole basis for treatment. Nasal washings and  aspirates are unacceptable for Xpert Xpress SARS-CoV-2/FLU/RSV  testing. Fact Sheet for Patients: PinkCheek.be Fact Sheet for Healthcare Providers: GravelBags.it This test is not yet approved or cleared by the [place] FDA and  has been authorized for detection and/or diagnosis of SARS-CoV-2 by  FDA under an Emergency Use Authorization (EUA). This EUA will remain  in effect (meaning this test can be used) for the duration of the  Covid-19 declaration under Section 564(b)(1) of the Act, 21  U.S.C. section 360bbb-3(b)(1), unless the authorization is  terminated or revoked. Performed at [facility], [address] [place], [place] [address]   MRSA PCR Screening     Status: None   Collection Time: 05/30/19  7:31 PM   Specimen: Nasal Mucosa;  Nasopharyngeal  Result Value Ref Range Status   MRSA by PCR NEGATIVE NEGATIVE Final    Comment:        The GeneXpert MRSA Assay (FDA approved for NASAL specimens only), is one component of a comprehensive MRSA colonization surveillance program. It is not intended to diagnose MRSA infection nor to guide or monitor treatment for MRSA infections. Performed at [facility], [address] [place], [place] [address]      Labs: BNP (last 3 results) Recent Labs    05/30/19 1544  BNP 56.4   Basic Metabolic Panel: Recent Labs  Lab 05/30/19 1425 05/31/19 0627 06/04/19 0526  NA 135 135 138  K 3.8 3.4* 3.6  CL 100 102 103  CO2 24 25 26   GLUCOSE 130* 113* 96  BUN 15 11 14   CREATININE 0.82 0.80 0.79  CALCIUM 9.7 9.3 9.0  MG  --   --  1.8   Liver  Function Tests: Recent Labs  Lab 05/30/19 1425  AST 62*  ALT 27  ALKPHOS 49  BILITOT 0.9  PROT 7.9  ALBUMIN 5.1*   No results for input(s): LIPASE, AMYLASE in the last 168 hours. No results for input(s): AMMONIA in the last 168 hours. CBC: Recent Labs  Lab 05/30/19 1425 05/31/19 0627  WBC 13.1* 12.4*  NEUTROABS 11.3* 8.7*  HGB 17.6* 15.3  HCT 47.2 42.6  MCV 92.0 94.0  PLT 173 166   Cardiac Enzymes: No results for input(s): CKTOTAL, CKMB, CKMBINDEX, TROPONINI in the last 168 hours. BNP: Invalid input(s): POCBNP CBG: Recent Labs  Lab 06/03/19 0443 06/03/19 1204 06/03/19 1822 06/04/19 0046 06/04/19 0559  GLUCAP 102* 154* 179* 109* 88   D-Dimer No results for input(s): DDIMER in the last 72 hours. Hgb A1c No results for input(s): HGBA1C in the last 72 hours. Lipid Profile No results for input(s): CHOL, HDL, LDLCALC, TRIG, CHOLHDL, LDLDIRECT in the last 72 hours. Thyroid function studies No results for input(s): TSH, T4TOTAL, T3FREE, THYROIDAB in the last 72 hours.  Invalid input(s): FREET3 Anemia work up No results for input(s): VITAMINB12, FOLATE, FERRITIN, TIBC, IRON, RETICCTPCT in the last  72 hours. Urinalysis    Component Value Date/Time   COLORURINE AMBER (A) 07/11/2015 1438   APPEARANCEUR HAZY (A) 07/11/2015 1438   APPEARANCEUR Clear 02/04/2014 1838   LABSPEC 1.026 07/11/2015 1438   LABSPEC 1.004 02/04/2014 1838   PHURINE 5.0 07/11/2015 1438   GLUCOSEU NEGATIVE 07/11/2015 1438   GLUCOSEU Negative 02/04/2014 1838   HGBUR NEGATIVE 07/11/2015 1438   BILIRUBINUR NEGATIVE 07/11/2015 1438   BILIRUBINUR Negative 02/04/2014 1838   KETONESUR TRACE (A) 07/11/2015 1438   PROTEINUR NEGATIVE 07/11/2015 1438   NITRITE NEGATIVE 07/11/2015 1438   LEUKOCYTESUR NEGATIVE 07/11/2015 1438   LEUKOCYTESUR Negative 02/04/2014 1838   Sepsis Labs Invalid input(s): PROCALCITONIN,  WBC,  LACTICIDVEN Microbiology Recent Results (from the past 240 hour(s))  Respiratory Panel by RT PCR (Flu A&B, Covid) - Nasopharyngeal Swab     Status: None   Collection Time: 05/30/19  3:44 PM   Specimen: Nasopharyngeal Swab  Result Value Ref Range Status   SARS Coronavirus 2 by RT PCR NEGATIVE NEGATIVE Final    Comment: (NOTE) SARS-CoV-2 target nucleic acids are NOT DETECTED. The SARS-CoV-2 RNA is generally detectable in upper respiratoy specimens during the acute phase of infection. The lowest concentration of SARS-CoV-2 viral copies this assay can detect is 131 copies/mL. A negative result does not preclude SARS-Cov-2 infection and should not be used as the sole basis for treatment or other patient management decisions. A negative result may occur with  improper specimen collection/handling, submission of specimen other than nasopharyngeal swab, presence of viral mutation(s) within the areas targeted by this assay, and inadequate number of viral copies (<131 copies/mL). A negative result must be combined with clinical observations, patient history, and epidemiological information. The expected result is Negative. Fact Sheet for Patients:  https://www.moore.com/ Fact Sheet for  Healthcare Providers:  https://www.young.biz/ This test is not yet ap proved or cleared by the [place] FDA and  has been authorized for detection and/or diagnosis of SARS-CoV-2 by FDA under an Emergency Use Authorization (EUA). This EUA will remain  in effect (meaning this test can be used) for the duration of the COVID-19 declaration under Section 564(b)(1) of the Act, 21 U.S.C. section 360bbb-3(b)(1), unless the authorization is terminated or revoked sooner.    Influenza A by PCR NEGATIVE NEGATIVE Final   Influenza B by  PCR NEGATIVE NEGATIVE Final    Comment: (NOTE) The Xpert Xpress SARS-CoV-2/FLU/RSV assay is intended as an aid in  the diagnosis of influenza from Nasopharyngeal swab specimens and  should not be used as a sole basis for treatment. Nasal washings and  aspirates are unacceptable for Xpert Xpress SARS-CoV-2/FLU/RSV  testing. Fact Sheet for Patients: https://www.moore.com/ Fact Sheet for Healthcare Providers: https://www.young.biz/ This test is not yet approved or cleared by the [place] FDA and  has been authorized for detection and/or diagnosis of SARS-CoV-2 by  FDA under an Emergency Use Authorization (EUA). This EUA will remain  in effect (meaning this test can be used) for the duration of the  Covid-19 declaration under Section 564(b)(1) of the Act, 21  U.S.C. section 360bbb-3(b)(1), unless the authorization is  terminated or revoked. Performed at [facility], [address] [place], [place] [address]   MRSA PCR Screening     Status: None   Collection Time: 05/30/19  7:31 PM   Specimen: Nasal Mucosa; Nasopharyngeal  Result Value Ref Range Status   MRSA by PCR NEGATIVE NEGATIVE Final    Comment:        The GeneXpert MRSA Assay (FDA approved for NASAL specimens only), is one component of a comprehensive MRSA colonization surveillance program. It is not intended to diagnose  MRSA infection nor to guide or monitor treatment for MRSA infections. Performed at [facility], [address] [place], [place] [address]      Time coordinating discharge: Over 30 minutes  SIGNED:   Tresa Moore, MD  [facility] 06/04/2019, 11:14 AM Pager   If 7PM-7AM, please contact night-coverage

## 2019-06-08 ENCOUNTER — Encounter
Admission: RE | Admit: 2019-06-08 | Discharge: 2019-06-08 | Disposition: A | Discharge status: Home / Self Care | Payer: Medicare Other | Source: Ambulatory Visit | Attending: Internal Medicine | Admitting: Internal Medicine

## 2019-06-30 ENCOUNTER — Other Ambulatory Visit
Admission: RE | Admit: 2019-06-30 | Discharge: 2019-06-30 | Disposition: A | Discharge status: Home / Self Care | Payer: Medicare Other | Source: Ambulatory Visit | Attending: Internal Medicine | Admitting: Internal Medicine

## 2019-06-30 DIAGNOSIS — R093 Abnormal sputum: Secondary | ICD-10-CM | POA: Diagnosis present

## 2019-06-30 LAB — URINALYSIS, ROUTINE W REFLEX MICROSCOPIC
Bilirubin Urine: NEGATIVE
Glucose, UA: NEGATIVE mg/dL
Hgb urine dipstick: NEGATIVE
Ketones, ur: NEGATIVE mg/dL
Leukocytes,Ua: NEGATIVE
Nitrite: NEGATIVE
Protein, ur: NEGATIVE mg/dL
Specific Gravity, Urine: 1.006 (ref 1.005–1.030)
pH: 6 (ref 5.0–8.0)

## 2019-07-02 LAB — URINE CULTURE: Culture: 20000 — AB

## 2019-07-13 ENCOUNTER — Encounter
Admission: RE | Admit: 2019-07-13 | Discharge: 2019-07-13 | Disposition: A | Discharge status: Home / Self Care | Payer: Medicare Other | Source: Ambulatory Visit | Attending: Internal Medicine | Admitting: Internal Medicine

## 2019-09-06 ENCOUNTER — Other Ambulatory Visit: Payer: Self-pay

## 2019-09-06 ENCOUNTER — Emergency Department
Admission: EM | Admit: 2019-09-06 | Discharge: 2019-09-06 | Disposition: A | Discharge status: Home / Self Care | Payer: Medicare Other | Source: Home / Self Care

## 2019-09-06 ENCOUNTER — Encounter: Payer: Self-pay | Admitting: Emergency Medicine

## 2019-09-06 ENCOUNTER — Emergency Department: Payer: Medicare Other

## 2019-09-06 DIAGNOSIS — Z5321 Procedure and treatment not carried out due to patient leaving prior to being seen by health care provider: Secondary | ICD-10-CM | POA: Insufficient documentation

## 2019-09-06 DIAGNOSIS — R0602 Shortness of breath: Secondary | ICD-10-CM | POA: Insufficient documentation

## 2019-09-06 DIAGNOSIS — R079 Chest pain, unspecified: Secondary | ICD-10-CM | POA: Insufficient documentation

## 2019-09-06 LAB — TROPONIN I (HIGH SENSITIVITY): Troponin I (High Sensitivity): 4 ng/L (ref <18)

## 2019-09-06 LAB — BASIC METABOLIC PANEL
Anion gap: 11 (ref 5–15)
BUN: 13 mg/dL (ref 8–23)
CO2: 27 mmol/L (ref 22–32)
Calcium: 9.4 mg/dL (ref 8.9–10.3)
Chloride: 99 mmol/L (ref 98–111)
Creatinine, Ser: 0.85 mg/dL (ref 0.61–1.24)
GFR calc Af Amer: >60 mL/min (ref >60)
GFR calc non Af Amer: >60 mL/min (ref >60)
Glucose, Bld: 102 mg/dL — ABNORMAL HIGH (ref 70–99)
Potassium: 4.2 mmol/L (ref 3.5–5.1)
Sodium: 137 mmol/L (ref 135–145)

## 2019-09-06 LAB — CBC
HCT: 40.7 % (ref 39.0–52.0)
Hemoglobin: 14.3 g/dL (ref 13.0–17.0)
MCH: 33.3 pg (ref 26.0–34.0)
MCHC: 35.1 g/dL (ref 30.0–36.0)
MCV: 94.9 fL (ref 80.0–100.0)
Platelets: 173 10*3/uL (ref 150–400)
RBC: 4.29 MIL/uL (ref 4.22–5.81)
RDW: 11.9 % (ref 11.5–15.5)
WBC: 5.4 10*3/uL (ref 4.0–10.5)
nRBC: 0 % (ref 0.0–0.2)

## 2019-09-06 IMAGING — CR DG CHEST 2V
2 series · 3 of 3 positions shown · non-contrast
Comparison: [DATE]

CLINICAL DATA: Chest pain.  Recent fall

EXAM:
CHEST - 2 VIEW

[Series 1: dg chest 2 view · 0.14mm/px · 2 of 2 slices shown]
[im 1/2]
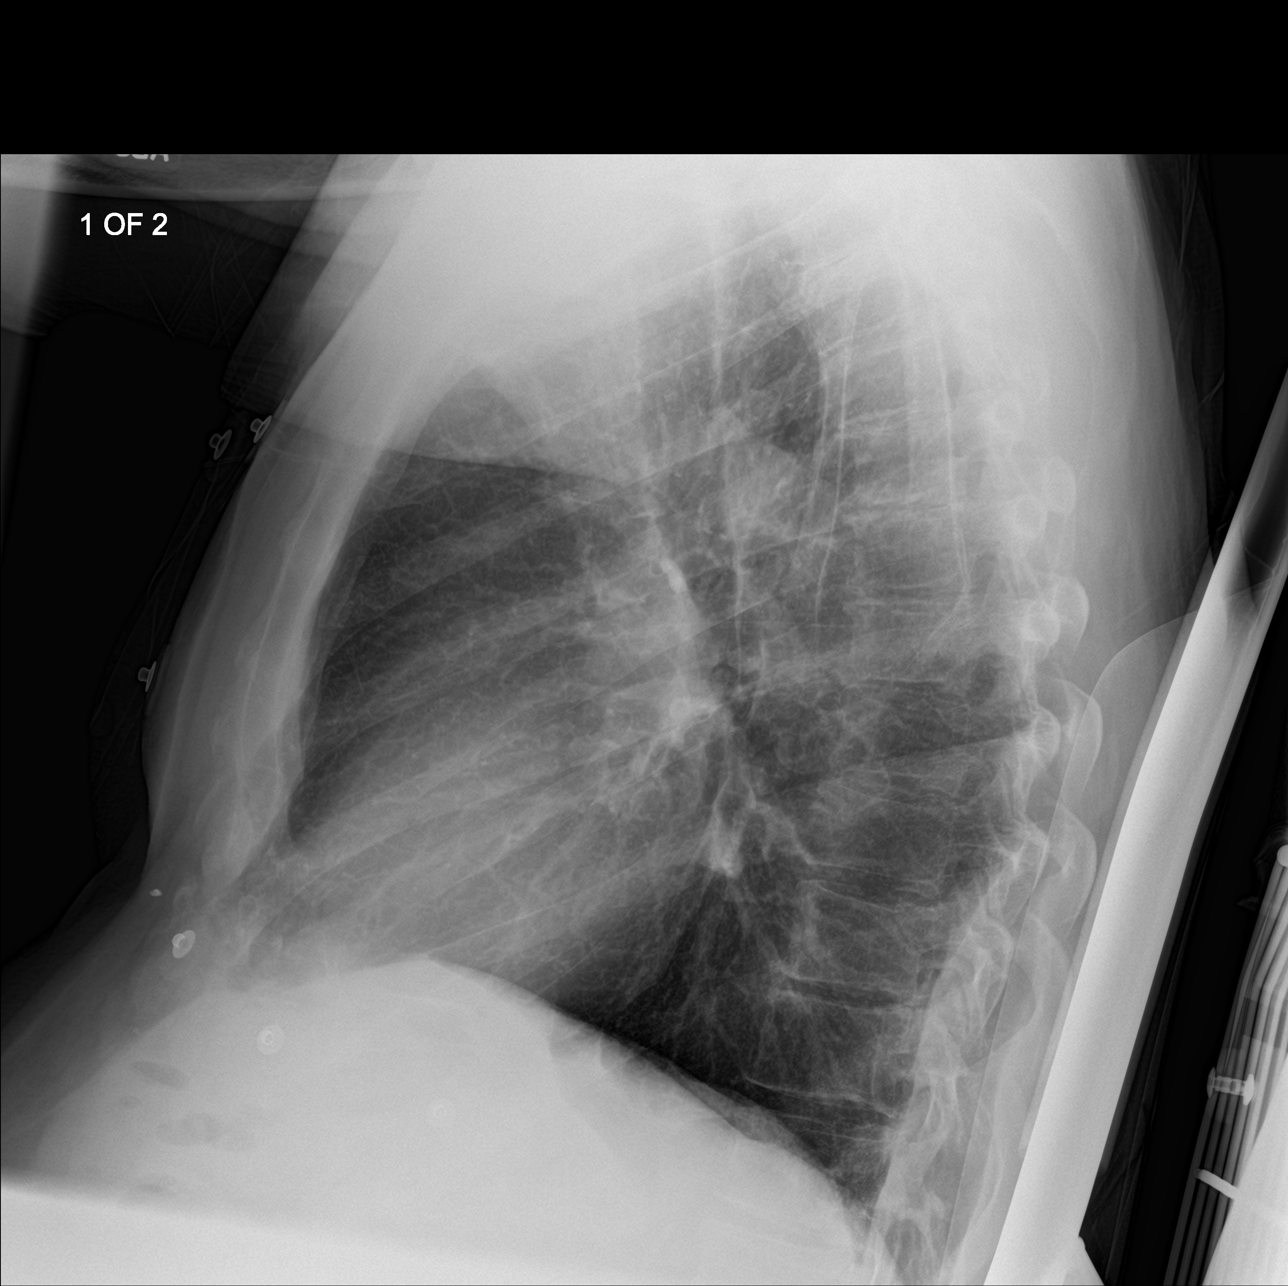
[im 2/2]
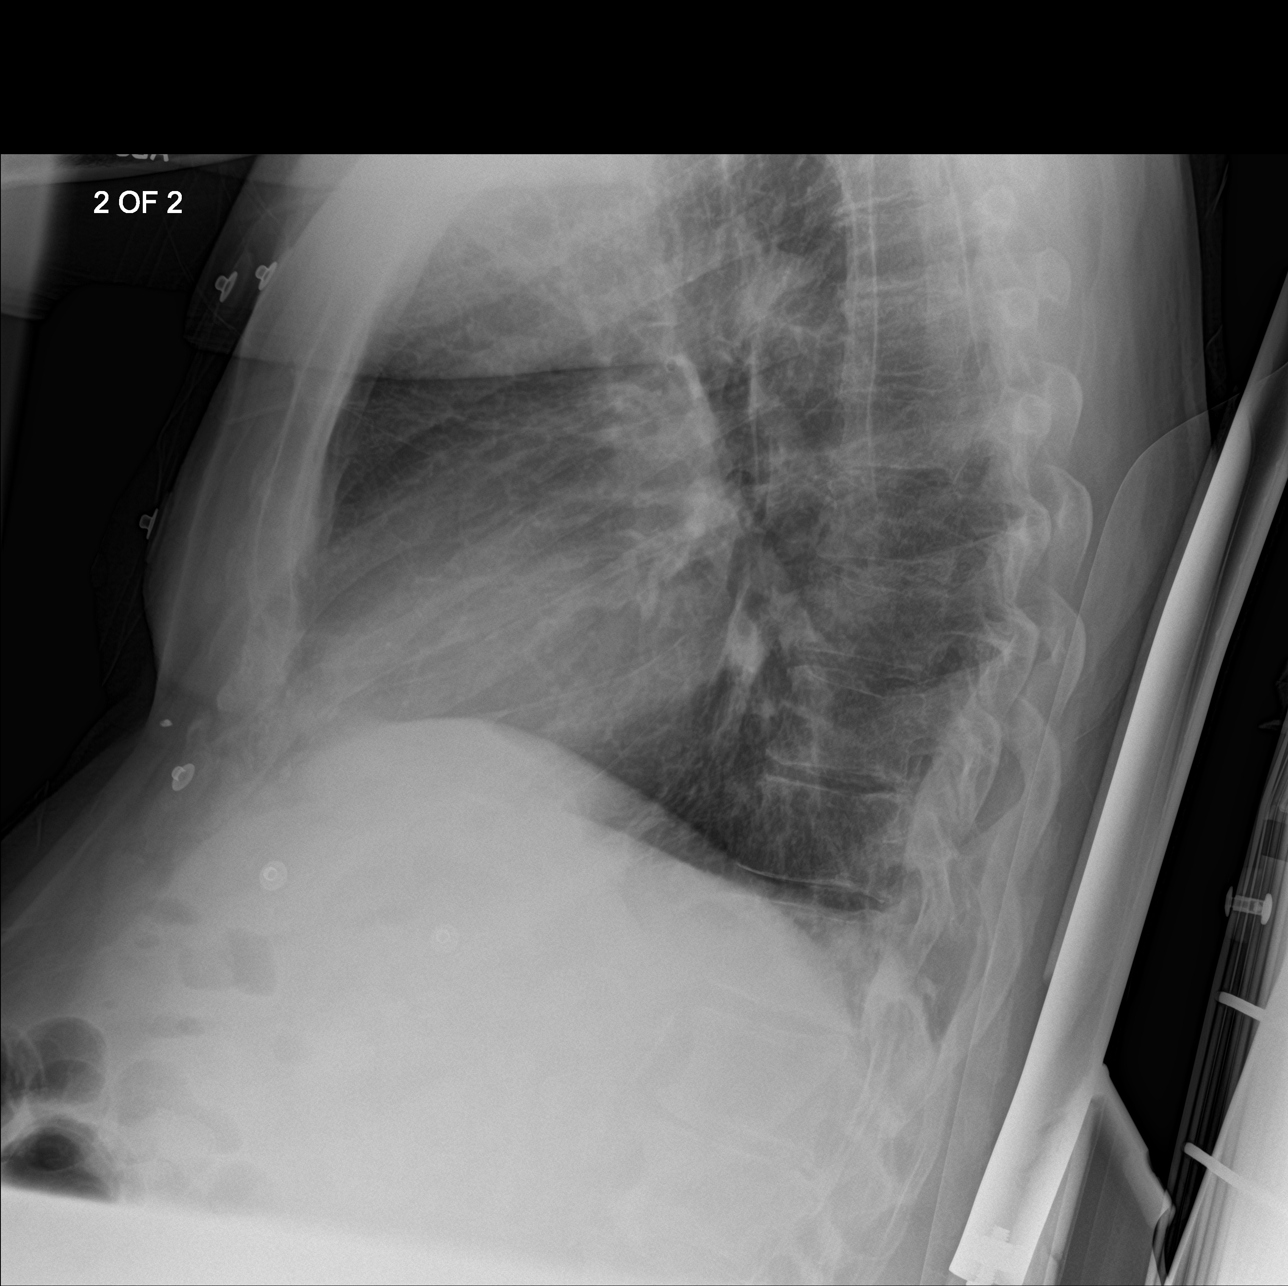

[chest ap]
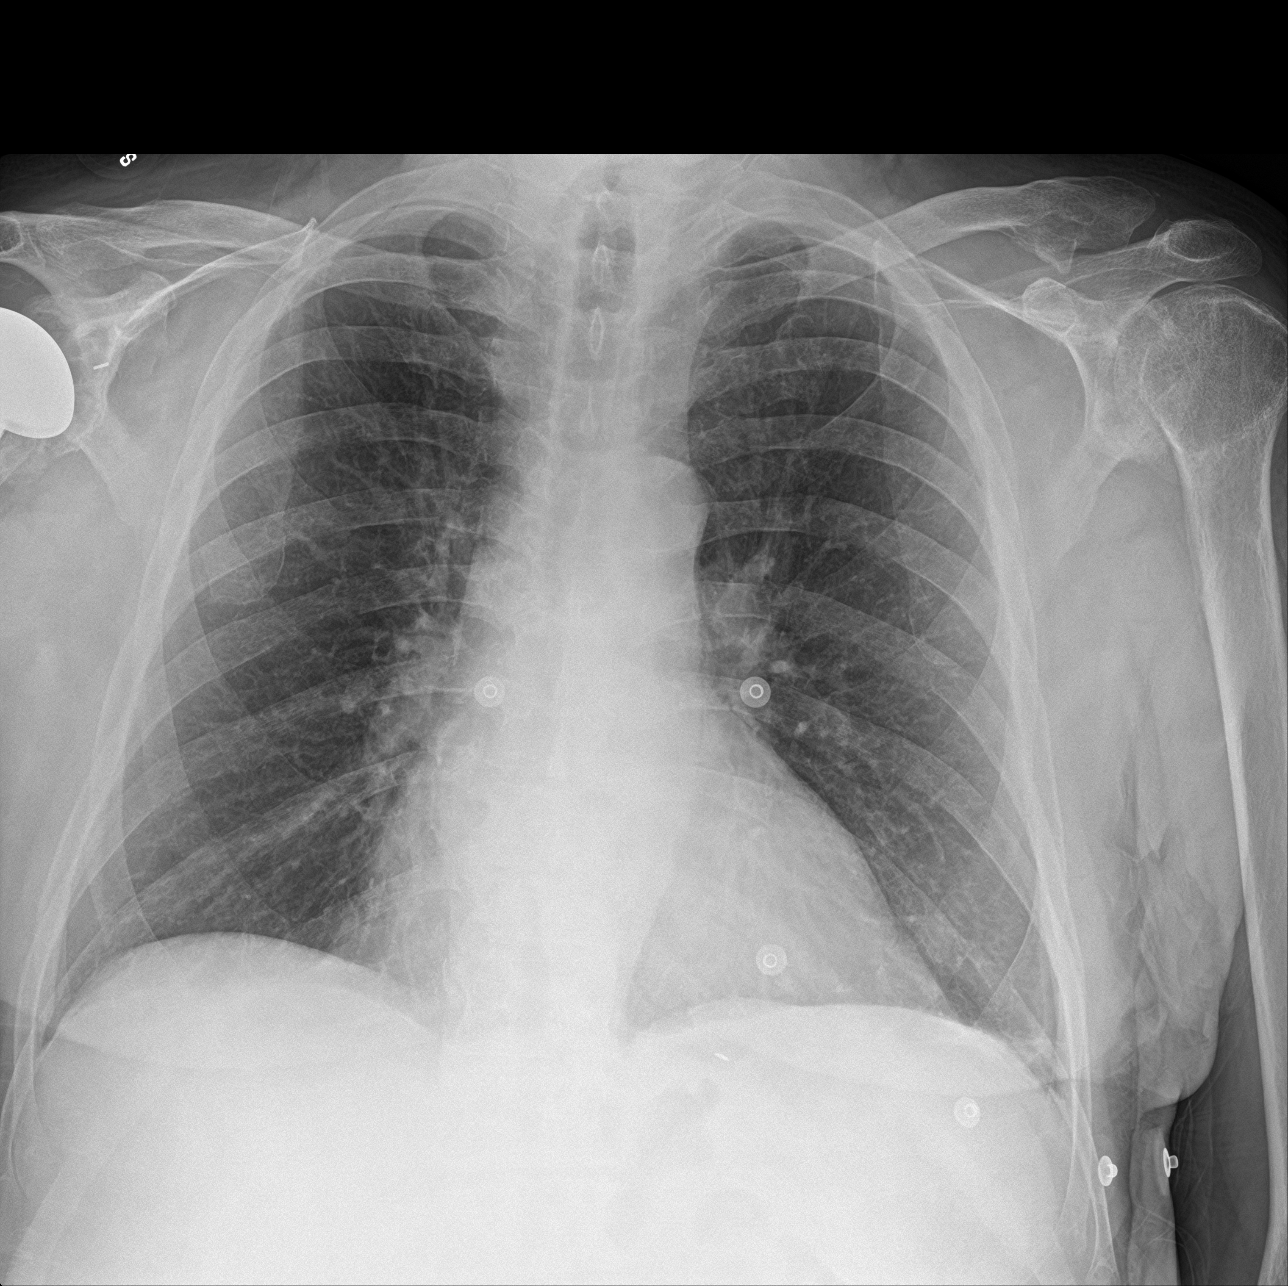

[3 of 3 positions shown; findings below may reference images not displayed]

FINDINGS: There is mild lateral left base atelectasis. Lungs elsewhere are
clear. Heart size and pulmonary vascularity are normal. No
adenopathy. There is a total shoulder replacement on the right. No
pneumothorax. Exostosis along the inferior left clavicle laterally
is stable.
IMPRESSION: Mild left base atelectasis. Lungs elsewhere clear. Cardiac
silhouette within normal limits.

## 2019-09-06 NOTE — ED Triage Notes (Addendum)
See first nurse note. Patient to ER via [facility] from [facility] independent living for c/o chest pain to left chest after falling 2-3 days ago. Patient unsure of why he fell (loss of balance, tripped, etc), but doesn't believe he had syncopal episode. Patient reports mild shortness of breath only since getting in ambulance. Patient reports worsening of pain with movement.

## 2019-09-06 NOTE — ED Triage Notes (Addendum)
Pt comes into the ED via EMS from home at independent living at [facility] with c/o chest pain, states he fell about 2-3 days ago and has been having left sided pain with palpation and movement. 170/70, 62HR, 98%RA, CBG 119 ASA 324mg  given, hx of stroke with left side weakness. ortho boot on right LE from previous falls, hx of multiple falls

## 2019-09-07 ENCOUNTER — Other Ambulatory Visit: Payer: Self-pay

## 2019-09-07 DIAGNOSIS — F419 Anxiety disorder, unspecified: Secondary | ICD-10-CM | POA: Diagnosis present

## 2019-09-07 DIAGNOSIS — F329 Major depressive disorder, single episode, unspecified: Secondary | ICD-10-CM | POA: Diagnosis present

## 2019-09-07 DIAGNOSIS — W19XXXD Unspecified fall, subsequent encounter: Secondary | ICD-10-CM | POA: Diagnosis present

## 2019-09-07 DIAGNOSIS — J449 Chronic obstructive pulmonary disease, unspecified: Secondary | ICD-10-CM | POA: Diagnosis present

## 2019-09-07 DIAGNOSIS — G20 Parkinson's disease: Secondary | ICD-10-CM | POA: Diagnosis present

## 2019-09-07 DIAGNOSIS — G40909 Epilepsy, unspecified, not intractable, without status epilepticus: Secondary | ICD-10-CM | POA: Diagnosis present

## 2019-09-07 DIAGNOSIS — Z87891 Personal history of nicotine dependence: Secondary | ICD-10-CM

## 2019-09-07 DIAGNOSIS — Z20822 Contact with and (suspected) exposure to covid-19: Secondary | ICD-10-CM | POA: Diagnosis present

## 2019-09-07 DIAGNOSIS — I119 Hypertensive heart disease without heart failure: Secondary | ICD-10-CM | POA: Diagnosis present

## 2019-09-07 DIAGNOSIS — I160 Hypertensive urgency: Secondary | ICD-10-CM | POA: Diagnosis present

## 2019-09-07 DIAGNOSIS — Z981 Arthrodesis status: Secondary | ICD-10-CM

## 2019-09-07 DIAGNOSIS — S93114D Dislocation of interphalangeal joint of right lesser toe(s), subsequent encounter: Secondary | ICD-10-CM

## 2019-09-07 DIAGNOSIS — Z66 Do not resuscitate: Secondary | ICD-10-CM | POA: Diagnosis present

## 2019-09-07 DIAGNOSIS — E785 Hyperlipidemia, unspecified: Secondary | ICD-10-CM | POA: Diagnosis present

## 2019-09-07 DIAGNOSIS — K219 Gastro-esophageal reflux disease without esophagitis: Secondary | ICD-10-CM | POA: Diagnosis present

## 2019-09-07 DIAGNOSIS — Z8673 Personal history of transient ischemic attack (TIA), and cerebral infarction without residual deficits: Secondary | ICD-10-CM

## 2019-09-07 DIAGNOSIS — Z7902 Long term (current) use of antithrombotics/antiplatelets: Secondary | ICD-10-CM

## 2019-09-07 DIAGNOSIS — Z79899 Other long term (current) drug therapy: Secondary | ICD-10-CM

## 2019-09-07 DIAGNOSIS — S82841K Displaced bimalleolar fracture of right lower leg, subsequent encounter for closed fracture with nonunion: Secondary | ICD-10-CM

## 2019-09-07 DIAGNOSIS — I674 Hypertensive encephalopathy: Principal | ICD-10-CM | POA: Diagnosis present

## 2019-09-07 LAB — COMPREHENSIVE METABOLIC PANEL
ALT: 21 U/L (ref 0–44)
AST: 70 U/L — ABNORMAL HIGH (ref 15–41)
Albumin: 4.5 g/dL (ref 3.5–5.0)
Alkaline Phosphatase: 56 U/L (ref 38–126)
Anion gap: 15 (ref 5–15)
BUN: 12 mg/dL (ref 8–23)
CO2: 24 mmol/L (ref 22–32)
Calcium: 9.6 mg/dL (ref 8.9–10.3)
Chloride: 101 mmol/L (ref 98–111)
Creatinine, Ser: 0.85 mg/dL (ref 0.61–1.24)
GFR calc Af Amer: >60 mL/min (ref >60)
GFR calc non Af Amer: >60 mL/min (ref >60)
Glucose, Bld: 116 mg/dL — ABNORMAL HIGH (ref 70–99)
Potassium: 4.1 mmol/L (ref 3.5–5.1)
Sodium: 140 mmol/L (ref 135–145)
Total Bilirubin: 1.3 mg/dL — ABNORMAL HIGH (ref 0.3–1.2)
Total Protein: 7.2 g/dL (ref 6.5–8.1)

## 2019-09-07 LAB — CBC
HCT: 42 % (ref 39.0–52.0)
Hemoglobin: 14.6 g/dL (ref 13.0–17.0)
MCH: 33 pg (ref 26.0–34.0)
MCHC: 34.8 g/dL (ref 30.0–36.0)
MCV: 94.8 fL (ref 80.0–100.0)
Platelets: 187 10*3/uL (ref 150–400)
RBC: 4.43 MIL/uL (ref 4.22–5.81)
RDW: 11.8 % (ref 11.5–15.5)
WBC: 6.8 10*3/uL (ref 4.0–10.5)
nRBC: 0 % (ref 0.0–0.2)

## 2019-09-07 NOTE — ED Triage Notes (Signed)
First nurse note- here for weakness.  Son unsure if had a seizure but oriented at this time per EMS.  NAD, VSS

## 2019-09-07 NOTE — ED Triage Notes (Signed)
Pt here with son who states pt lives at [facility] independent and son got the nurse to check on pt earlier today. Unsure if had seizure but son states pt is acting post-ictal, starting to talk more now. Pt talking in triage but appears to be confused. Broke R foot (in boot) and was placed in SNF, just got to [facility] a few days ago. Unsure if has been given meds there.

## 2019-09-08 ENCOUNTER — Emergency Department: Payer: Medicare Other

## 2019-09-08 ENCOUNTER — Inpatient Hospital Stay
Admission: EM | Admit: 2019-09-08 | Discharge: 2019-09-14 | DRG: 982 | Disposition: A | Discharge status: Skilled Nursing Facility | Payer: Medicare Other | Attending: Family Medicine | Admitting: Family Medicine

## 2019-09-08 DIAGNOSIS — S82841K Displaced bimalleolar fracture of right lower leg, subsequent encounter for closed fracture with nonunion: Secondary | ICD-10-CM | POA: Diagnosis not present

## 2019-09-08 DIAGNOSIS — G20 Parkinson's disease: Secondary | ICD-10-CM | POA: Diagnosis present

## 2019-09-08 DIAGNOSIS — S93114D Dislocation of interphalangeal joint of right lesser toe(s), subsequent encounter: Secondary | ICD-10-CM | POA: Diagnosis not present

## 2019-09-08 DIAGNOSIS — R52 Pain, unspecified: Secondary | ICD-10-CM

## 2019-09-08 DIAGNOSIS — W19XXXA Unspecified fall, initial encounter: Secondary | ICD-10-CM | POA: Diagnosis present

## 2019-09-08 DIAGNOSIS — J449 Chronic obstructive pulmonary disease, unspecified: Secondary | ICD-10-CM | POA: Diagnosis not present

## 2019-09-08 DIAGNOSIS — R569 Unspecified convulsions: Secondary | ICD-10-CM

## 2019-09-08 DIAGNOSIS — Z789 Other specified health status: Secondary | ICD-10-CM

## 2019-09-08 DIAGNOSIS — Z981 Arthrodesis status: Secondary | ICD-10-CM | POA: Diagnosis not present

## 2019-09-08 DIAGNOSIS — Z79899 Other long term (current) drug therapy: Secondary | ICD-10-CM | POA: Diagnosis not present

## 2019-09-08 DIAGNOSIS — R9431 Abnormal electrocardiogram [ECG] [EKG]: Secondary | ICD-10-CM | POA: Diagnosis not present

## 2019-09-08 DIAGNOSIS — I674 Hypertensive encephalopathy: Secondary | ICD-10-CM | POA: Diagnosis present

## 2019-09-08 DIAGNOSIS — F329 Major depressive disorder, single episode, unspecified: Secondary | ICD-10-CM | POA: Diagnosis present

## 2019-09-08 DIAGNOSIS — I160 Hypertensive urgency: Secondary | ICD-10-CM | POA: Diagnosis present

## 2019-09-08 DIAGNOSIS — I10 Essential (primary) hypertension: Secondary | ICD-10-CM | POA: Diagnosis not present

## 2019-09-08 DIAGNOSIS — E785 Hyperlipidemia, unspecified: Secondary | ICD-10-CM | POA: Diagnosis present

## 2019-09-08 DIAGNOSIS — G9341 Metabolic encephalopathy: Secondary | ICD-10-CM | POA: Diagnosis present

## 2019-09-08 DIAGNOSIS — Z20822 Contact with and (suspected) exposure to covid-19: Secondary | ICD-10-CM | POA: Diagnosis present

## 2019-09-08 DIAGNOSIS — Z87891 Personal history of nicotine dependence: Secondary | ICD-10-CM | POA: Diagnosis not present

## 2019-09-08 DIAGNOSIS — Z7289 Other problems related to lifestyle: Secondary | ICD-10-CM

## 2019-09-08 DIAGNOSIS — I639 Cerebral infarction, unspecified: Secondary | ICD-10-CM | POA: Diagnosis present

## 2019-09-08 DIAGNOSIS — F1090 Alcohol use, unspecified, uncomplicated: Secondary | ICD-10-CM

## 2019-09-08 DIAGNOSIS — F419 Anxiety disorder, unspecified: Secondary | ICD-10-CM | POA: Diagnosis present

## 2019-09-08 DIAGNOSIS — M25572 Pain in left ankle and joints of left foot: Secondary | ICD-10-CM

## 2019-09-08 DIAGNOSIS — Z7902 Long term (current) use of antithrombotics/antiplatelets: Secondary | ICD-10-CM | POA: Diagnosis not present

## 2019-09-08 DIAGNOSIS — Z66 Do not resuscitate: Secondary | ICD-10-CM | POA: Diagnosis present

## 2019-09-08 DIAGNOSIS — I119 Hypertensive heart disease without heart failure: Secondary | ICD-10-CM | POA: Diagnosis present

## 2019-09-08 DIAGNOSIS — Z8673 Personal history of transient ischemic attack (TIA), and cerebral infarction without residual deficits: Secondary | ICD-10-CM | POA: Diagnosis not present

## 2019-09-08 DIAGNOSIS — R509 Fever, unspecified: Secondary | ICD-10-CM

## 2019-09-08 DIAGNOSIS — K219 Gastro-esophageal reflux disease without esophagitis: Secondary | ICD-10-CM | POA: Diagnosis present

## 2019-09-08 DIAGNOSIS — S82899A Other fracture of unspecified lower leg, initial encounter for closed fracture: Secondary | ICD-10-CM

## 2019-09-08 DIAGNOSIS — G40909 Epilepsy, unspecified, not intractable, without status epilepticus: Secondary | ICD-10-CM | POA: Diagnosis present

## 2019-09-08 DIAGNOSIS — G9340 Encephalopathy, unspecified: Secondary | ICD-10-CM

## 2019-09-08 DIAGNOSIS — T148XXA Other injury of unspecified body region, initial encounter: Secondary | ICD-10-CM

## 2019-09-08 DIAGNOSIS — F32A Depression, unspecified: Secondary | ICD-10-CM | POA: Diagnosis present

## 2019-09-08 DIAGNOSIS — W19XXXD Unspecified fall, subsequent encounter: Secondary | ICD-10-CM | POA: Diagnosis present

## 2019-09-08 LAB — URINALYSIS, COMPLETE (UACMP) WITH MICROSCOPIC
Bacteria, UA: NONE SEEN
Bilirubin Urine: NEGATIVE
Glucose, UA: NEGATIVE mg/dL
Hgb urine dipstick: NEGATIVE
Ketones, ur: 80 mg/dL — AB
Leukocytes,Ua: NEGATIVE
Nitrite: NEGATIVE
Protein, ur: 30 mg/dL — AB
Specific Gravity, Urine: 1.02 (ref 1.005–1.030)
Squamous Epithelial / HPF: NONE SEEN (ref 0–5)
pH: 5 (ref 5.0–8.0)

## 2019-09-08 LAB — URINE DRUG SCREEN, QUALITATIVE (ARMC ONLY)
Amphetamines, Ur Screen: NOT DETECTED
Barbiturates, Ur Screen: NOT DETECTED
Benzodiazepine, Ur Scrn: NOT DETECTED
Cannabinoid 50 Ng, Ur ~~LOC~~: NOT DETECTED
Cocaine Metabolite,Ur ~~LOC~~: NOT DETECTED
MDMA (Ecstasy)Ur Screen: NOT DETECTED
Methadone Scn, Ur: NOT DETECTED
Opiate, Ur Screen: NOT DETECTED
Phencyclidine (PCP) Ur S: NOT DETECTED
Tricyclic, Ur Screen: NOT DETECTED

## 2019-09-08 LAB — SARS CORONAVIRUS 2 BY RT PCR (HOSPITAL ORDER, PERFORMED IN ~~LOC~~ HOSPITAL LAB): SARS Coronavirus 2: NEGATIVE

## 2019-09-08 LAB — VALPROIC ACID LEVEL: Valproic Acid Lvl: 11 ug/mL — ABNORMAL LOW (ref 50.0–100.0)

## 2019-09-08 LAB — AMMONIA: Ammonia: 18 umol/L (ref 9–35)

## 2019-09-08 LAB — ETHANOL: Alcohol, Ethyl (B): <10 mg/dL (ref <10)

## 2019-09-08 IMAGING — DX DG CHEST 1V PORT
1 series · 1 of 1 positions shown · non-contrast
Comparison: [DATE]

CLINICAL DATA: Altered mental status.

EXAM:
PORTABLE CHEST 1 VIEW

[chest ap]
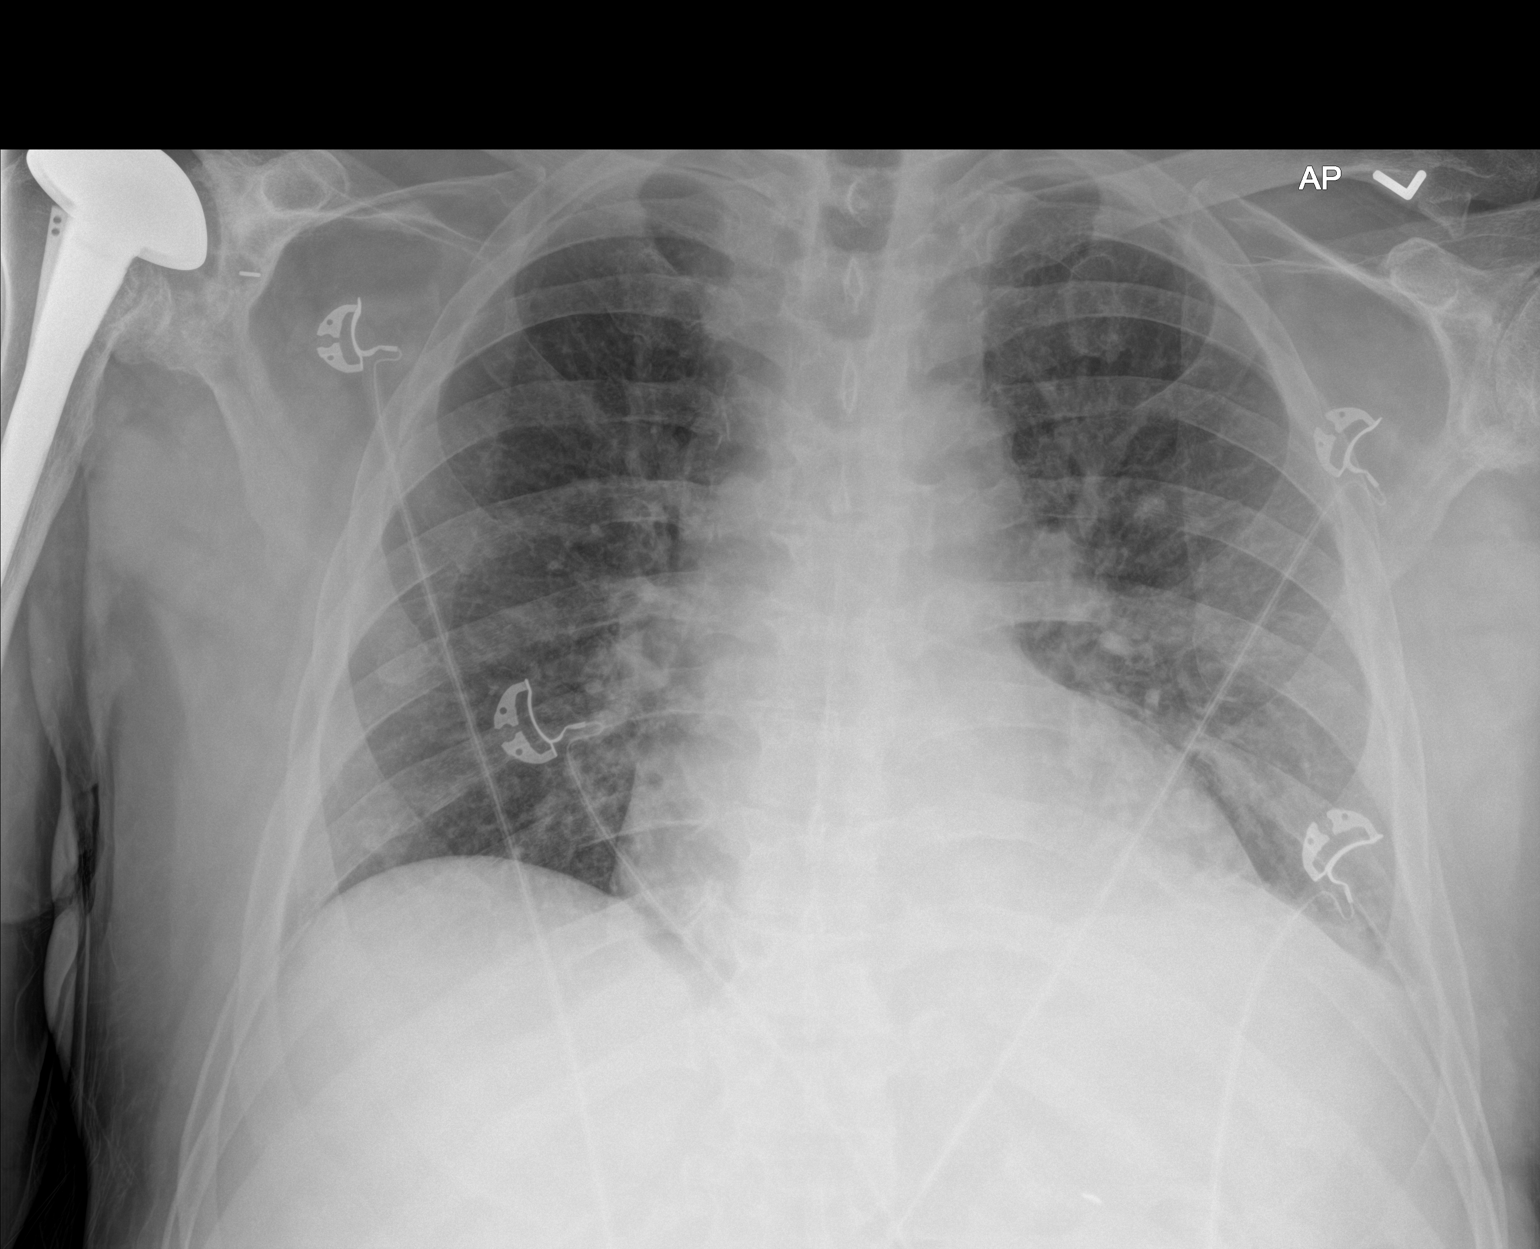

[1 of 1 positions shown; findings below may reference images not displayed]

FINDINGS: The cardiac silhouette, mediastinal and hilar contours are within
normal limits and stable.

Low lung volumes with vascular crowding and streaky atelectasis but
no infiltrates or effusions. The bony thorax is intact.
IMPRESSION: Low lung volumes with vascular crowding and streaky atelectasis.

## 2019-09-08 IMAGING — CT CT HEAD W/O CM
3 series · 16 of 47 positions shown, 19 images · non-contrast
Comparison: CT head without contrast [DATE]

CLINICAL DATA: Altered mental status.  Decreased responsiveness.

EXAM:
CT HEAD WITHOUT CONTRAST
TECHNIQUE: Contiguous axial images were obtained from the base of the skull
through the vertex without intravenous contrast.

[Series 2: head wo · axial · 0.45mm/px · z∈[-64,+71]mm · 10 of 33 slices shown, 13 images]
[im 3/33  brain]
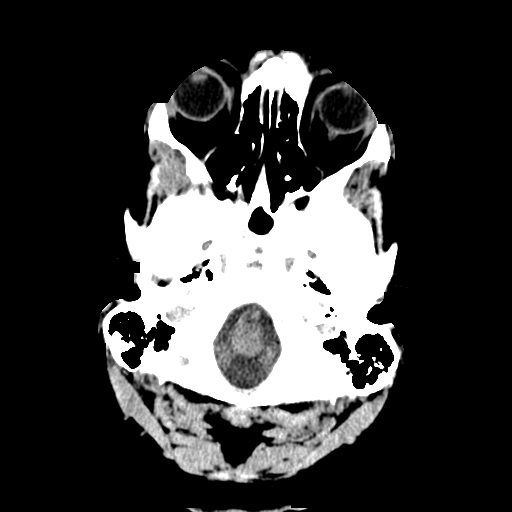
[im 3/33  bone]
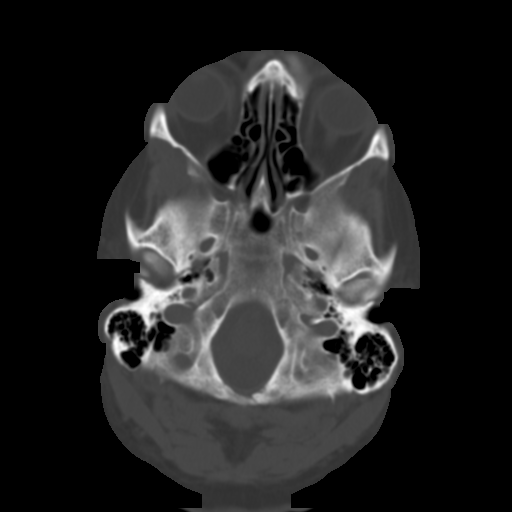
[im 6/33  brain]
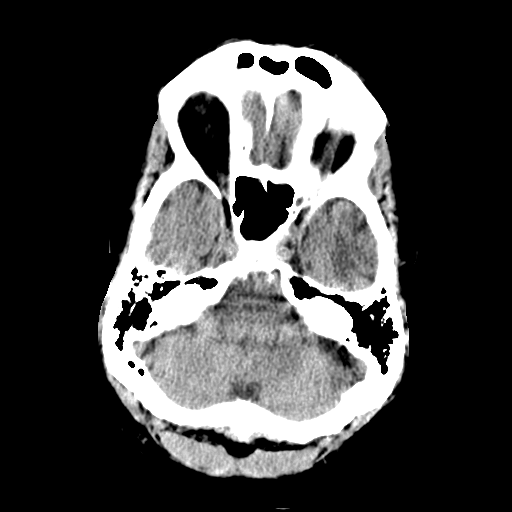
[im 9/33  brain]
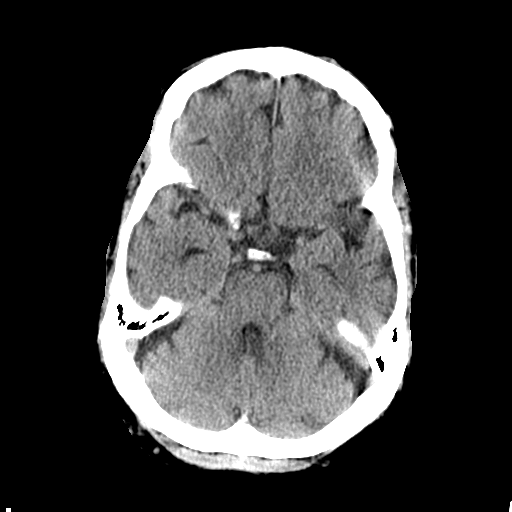
[im 12/33  brain]
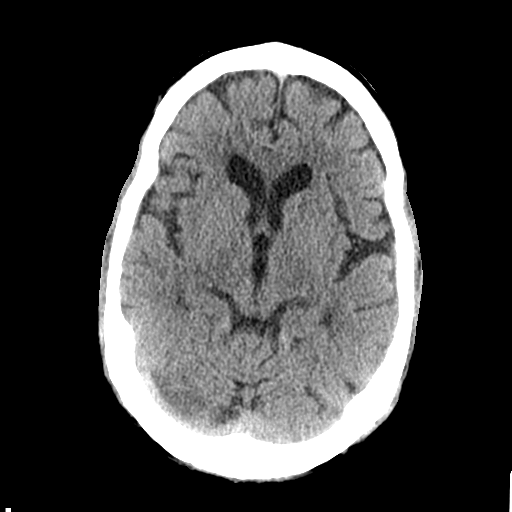
[im 15/33  brain]
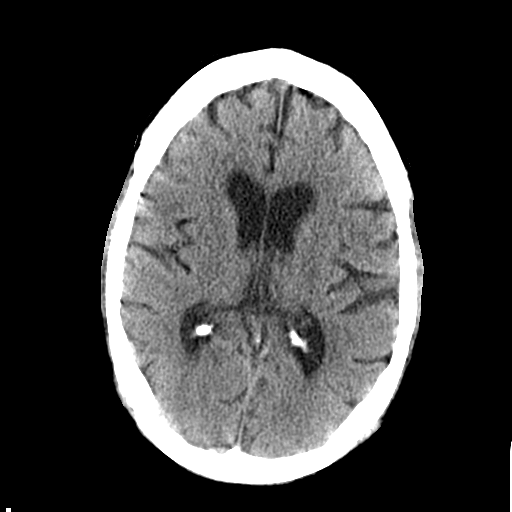
[im 15/33  bone]
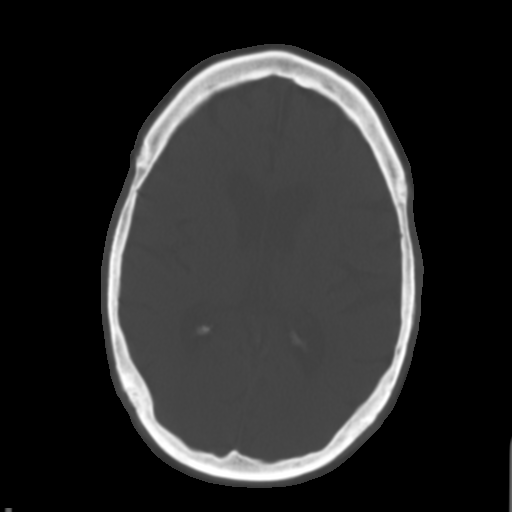
[im 18/33  brain]
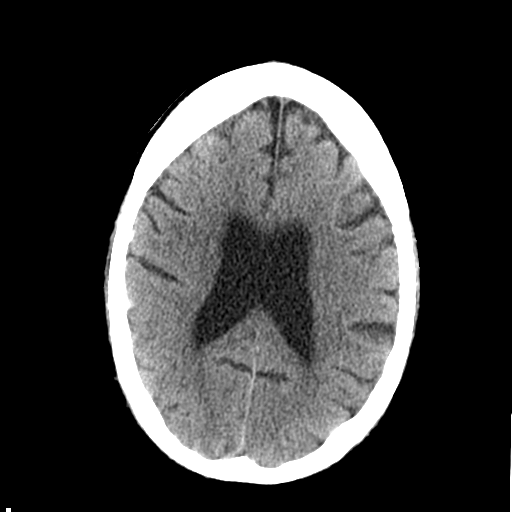
[im 21/33  brain]
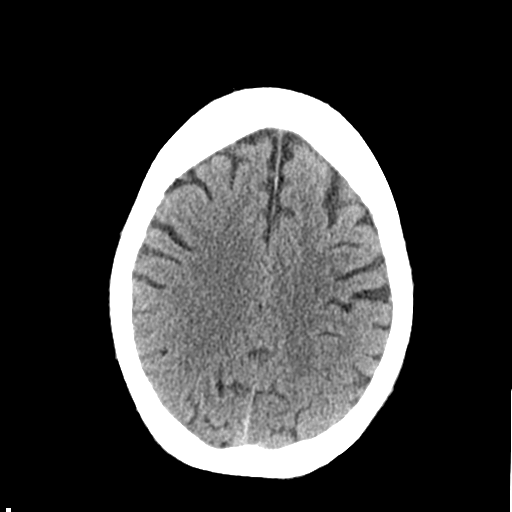
[im 25/33  brain]
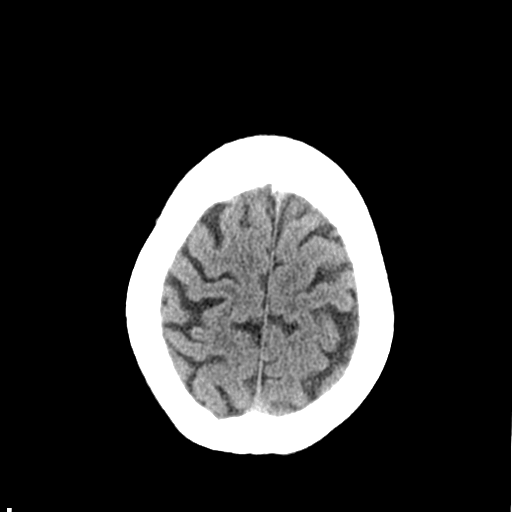
[im 27/33  brain]
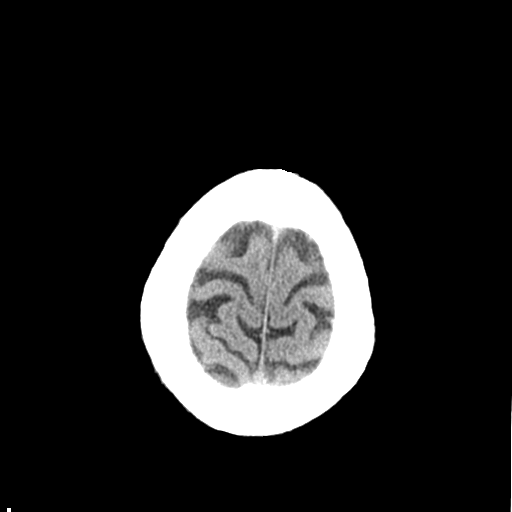
[im 27/33  bone]
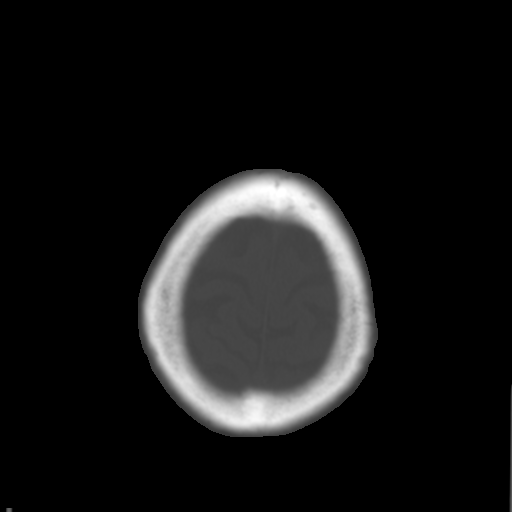
[im 30/33  brain]
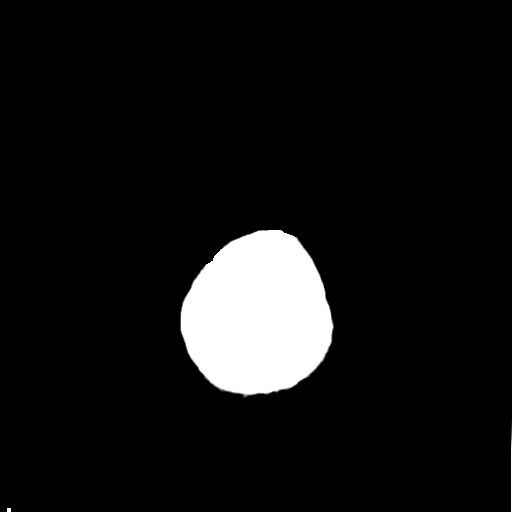

[Series 4: coronal soft tissue · coronal · 0.32mm/px · 3 of 66 slices shown]
[im 22/66  brain]
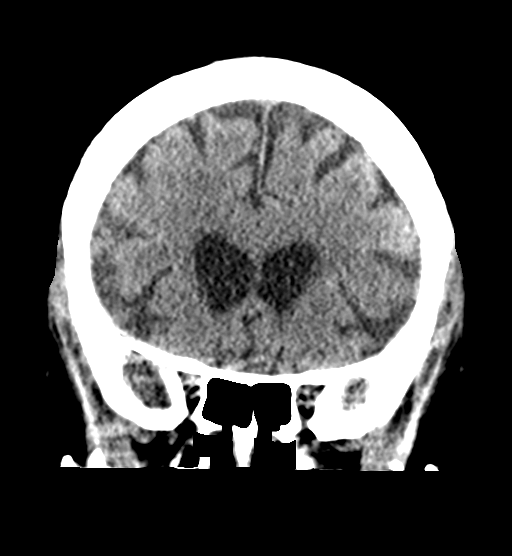
[im 29/66  brain]
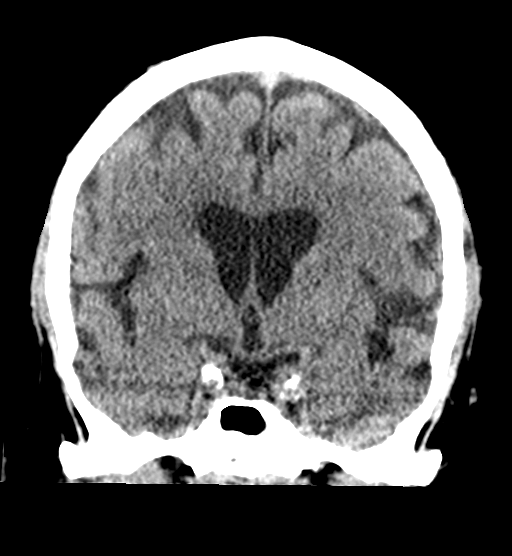
[im 37/66  brain]
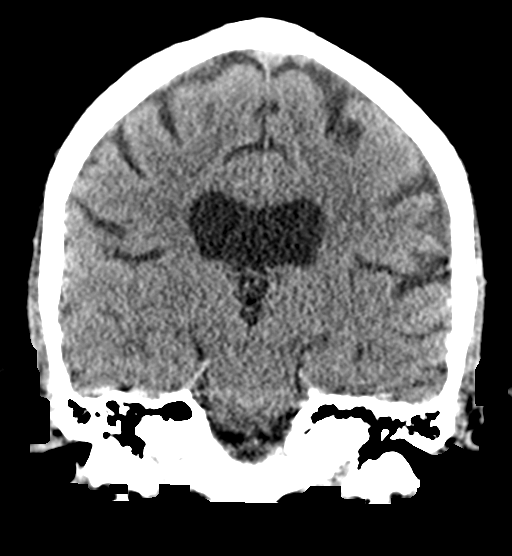

[Series 5: sagittal soft tissue · sagittal · 0.35mm/px · 3 of 53 slices shown]
[im 18/53  brain]
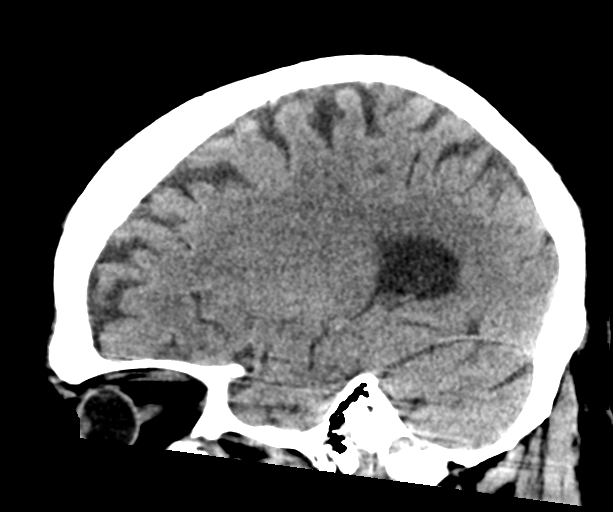
[im 27/53  brain]
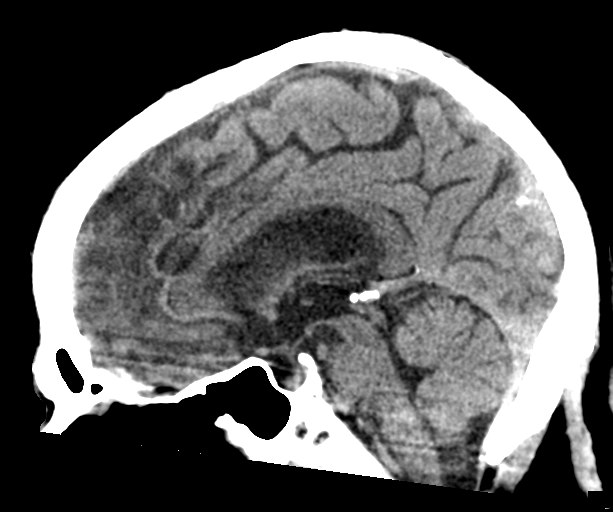
[im 35/53  brain]
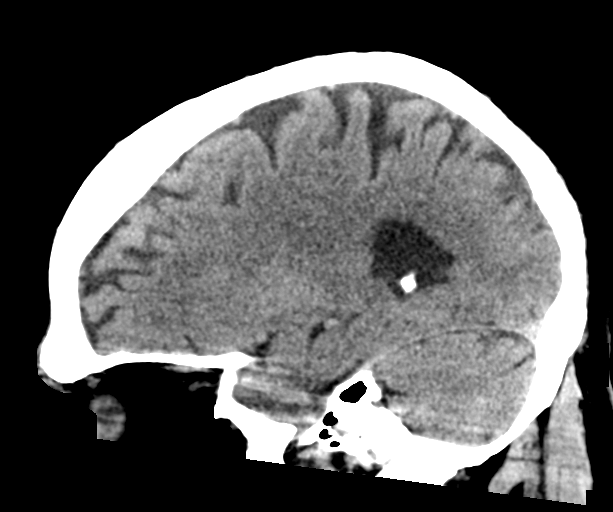

[16 of 47 positions shown; findings below may reference images not displayed]

FINDINGS: Brain: Mild atrophy and white matter changes are stable, likely
within normal limits for age. No acute infarct, hemorrhage, or mass
lesion is present. The ventricles are of normal size. No significant
extraaxial fluid collection is present. The brainstem and cerebellum
are within normal limits.

Vascular: Atherosclerotic calcifications are present within the
cavernous internal carotid arteries bilaterally.

Skull: Calvarium is intact. No focal lytic or blastic lesions are
present. Asymmetric right parietal scalp soft tissue swelling is
present without underlying fracture or foreign body.

Sinuses/Orbits: The paranasal sinuses and mastoid air cells are
clear. The globes and orbits are within normal limits.
IMPRESSION: 1. Asymmetric right parietal scalp soft tissue swelling without
underlying fracture or foreign body. Question trauma.
2. Stable atrophy and white matter disease, likely within normal
limits for age.
3. Atherosclerosis.

## 2019-09-08 MED ORDER — OMEGA-3-ACID ETHYL ESTERS 1 G PO CAPS
1000.0000 mg | ORAL_CAPSULE | Freq: Every day | ORAL | Status: DC
  Administered 2019-09-09 – 2019-09-14 (×5): 1000 mg via ORAL
  Filled 2019-09-08 (×5): qty 1

## 2019-09-08 MED ORDER — DIVALPROEX SODIUM 500 MG PO DR TAB
1000.0000 mg | DELAYED_RELEASE_TABLET | Freq: Every day | ORAL | Status: DC
  Administered 2019-09-08 – 2019-09-09 (×2): 1000 mg via ORAL
  Filled 2019-09-08 (×4): qty 2

## 2019-09-08 MED ORDER — ADULT MULTIVITAMIN W/MINERALS CH
1.0000 | ORAL_TABLET | Freq: Every day | ORAL | Status: DC
  Administered 2019-09-09 – 2019-09-14 (×5): 1 via ORAL
  Filled 2019-09-08 (×5): qty 1

## 2019-09-08 MED ORDER — ACETAMINOPHEN 325 MG PO TABS
650.0000 mg | ORAL_TABLET | Freq: Four times a day (QID) | ORAL | Status: DC | PRN
  Administered 2019-09-13 – 2019-09-14 (×2): 650 mg via ORAL
  Filled 2019-09-08 (×2): qty 2

## 2019-09-08 MED ORDER — LORAZEPAM 2 MG/ML IJ SOLN: 1.0000 mg | INTRAMUSCULAR | Status: DC | PRN

## 2019-09-08 MED ORDER — ADULT MULTIVITAMIN W/MINERALS CH
1.0000 | ORAL_TABLET | Freq: Every day | ORAL | Status: DC
  Administered 2019-09-08: 1 via ORAL
  Filled 2019-09-08: qty 1

## 2019-09-08 MED ORDER — DIVALPROEX SODIUM 500 MG PO DR TAB
750.0000 mg | DELAYED_RELEASE_TABLET | Freq: Every morning | ORAL | Status: DC
  Administered 2019-09-09: 750 mg via ORAL
  Filled 2019-09-08 (×2): qty 1

## 2019-09-08 MED ORDER — VALPROATE SODIUM 500 MG/5ML IV SOLN
750.0000 mg | Freq: Once | INTRAVENOUS | Status: AC
  Administered 2019-09-08: 750 mg via INTRAVENOUS
  Filled 2019-09-08: qty 7.5

## 2019-09-08 MED ORDER — THIAMINE HCL 100 MG PO TABS
100.0000 mg | ORAL_TABLET | Freq: Every day | ORAL | Status: DC
  Administered 2019-09-08 – 2019-09-14 (×5): 100 mg via ORAL
  Filled 2019-09-08 (×5): qty 1

## 2019-09-08 MED ORDER — SERTRALINE HCL 50 MG PO TABS
200.0000 mg | ORAL_TABLET | Freq: Every day | ORAL | Status: DC
  Administered 2019-09-09 – 2019-09-14 (×5): 200 mg via ORAL
  Filled 2019-09-08 (×5): qty 4

## 2019-09-08 MED ORDER — FOLIC ACID 1 MG PO TABS
1.0000 mg | ORAL_TABLET | Freq: Every day | ORAL | Status: DC
  Administered 2019-09-08 – 2019-09-14 (×6): 1 mg via ORAL
  Filled 2019-09-08 (×7): qty 1

## 2019-09-08 MED ORDER — DIVALPROEX SODIUM 250 MG PO DR TAB: 750.0000 mg | DELAYED_RELEASE_TABLET | Freq: Every morning | ORAL | Status: DC

## 2019-09-08 MED ORDER — LORAZEPAM 2 MG/ML IJ SOLN: 0.0000 mg | Freq: Four times a day (QID) | INTRAMUSCULAR | Status: DC

## 2019-09-08 MED ORDER — LEVETIRACETAM IN NACL 1000 MG/100ML IV SOLN
1000.0000 mg | Freq: Once | INTRAVENOUS | Status: AC
  Administered 2019-09-08: 1000 mg via INTRAVENOUS
  Filled 2019-09-08: qty 100

## 2019-09-08 MED ORDER — AMLODIPINE BESYLATE 10 MG PO TABS
10.0000 mg | ORAL_TABLET | Freq: Every day | ORAL | Status: DC
  Administered 2019-09-09 – 2019-09-14 (×4): 10 mg via ORAL
  Filled 2019-09-08 (×5): qty 1

## 2019-09-08 MED ORDER — HYDRALAZINE HCL 20 MG/ML IJ SOLN: 5.0000 mg | INTRAMUSCULAR | Status: DC | PRN

## 2019-09-08 MED ORDER — LISINOPRIL 20 MG PO TABS
20.0000 mg | ORAL_TABLET | Freq: Every day | ORAL | Status: DC
  Administered 2019-09-09 – 2019-09-14 (×4): 20 mg via ORAL
  Filled 2019-09-08 (×5): qty 1

## 2019-09-08 MED ORDER — ACETAMINOPHEN 650 MG RE SUPP: 650.0000 mg | Freq: Four times a day (QID) | RECTAL | Status: DC | PRN

## 2019-09-08 MED ORDER — LEVETIRACETAM IN NACL 1500 MG/100ML IV SOLN
1500.0000 mg | Freq: Two times a day (BID) | INTRAVENOUS | Status: DC
  Administered 2019-09-08: 1500 mg via INTRAVENOUS
  Filled 2019-09-08 (×4): qty 100

## 2019-09-08 MED ORDER — CLOPIDOGREL BISULFATE 75 MG PO TABS
75.0000 mg | ORAL_TABLET | Freq: Every day | ORAL | Status: DC
  Administered 2019-09-09 – 2019-09-14 (×5): 75 mg via ORAL
  Filled 2019-09-08 (×5): qty 1

## 2019-09-08 MED ORDER — LAMOTRIGINE 100 MG PO TABS
150.0000 mg | ORAL_TABLET | Freq: Two times a day (BID) | ORAL | Status: DC
  Administered 2019-09-08 – 2019-09-14 (×12): 150 mg via ORAL
  Filled 2019-09-08 (×12): qty 2

## 2019-09-08 MED ORDER — LORAZEPAM 2 MG/ML IJ SOLN: 0.0000 mg | Freq: Two times a day (BID) | INTRAMUSCULAR | Status: DC

## 2019-09-08 MED ORDER — ENOXAPARIN SODIUM 40 MG/0.4ML ~~LOC~~ SOLN
40.0000 mg | SUBCUTANEOUS | Status: DC
  Administered 2019-09-08 – 2019-09-13 (×6): 40 mg via SUBCUTANEOUS
  Filled 2019-09-08 (×7): qty 0.4

## 2019-09-08 MED ORDER — ONDANSETRON HCL 4 MG/2ML IJ SOLN: 4.0000 mg | Freq: Three times a day (TID) | INTRAMUSCULAR | Status: DC | PRN

## 2019-09-08 MED ORDER — VITAMIN D 25 MCG (1000 UNIT) PO TABS
5000.0000 [IU] | ORAL_TABLET | Freq: Every day | ORAL | Status: DC
  Administered 2019-09-09 – 2019-09-14 (×5): 5000 [IU] via ORAL
  Filled 2019-09-08 (×5): qty 5

## 2019-09-08 MED ORDER — ATORVASTATIN CALCIUM 20 MG PO TABS
20.0000 mg | ORAL_TABLET | Freq: Every day | ORAL | Status: DC
  Administered 2019-09-08 – 2019-09-14 (×6): 20 mg via ORAL
  Filled 2019-09-08 (×5): qty 1

## 2019-09-08 MED ORDER — LACTATED RINGERS IV BOLUS
1000.0000 mL | Freq: Once | INTRAVENOUS | Status: AC
  Administered 2019-09-08: 1000 mL via INTRAVENOUS

## 2019-09-08 MED ORDER — LORAZEPAM 1 MG PO TABS
1.0000 mg | ORAL_TABLET | ORAL | Status: DC | PRN
  Administered 2019-09-08 (×2): 1 mg via ORAL
  Filled 2019-09-08 (×2): qty 1

## 2019-09-08 MED ORDER — PROPRANOLOL HCL 20 MG PO TABS
20.0000 mg | ORAL_TABLET | Freq: Two times a day (BID) | ORAL | Status: DC
  Administered 2019-09-08 – 2019-09-14 (×10): 20 mg via ORAL
  Filled 2019-09-08 (×13): qty 1

## 2019-09-08 MED ORDER — THIAMINE HCL 100 MG/ML IJ SOLN
100.0000 mg | Freq: Every day | INTRAMUSCULAR | Status: DC
  Administered 2019-09-10: 11:00:00 100 mg via INTRAVENOUS
  Filled 2019-09-08 (×3): qty 2

## 2019-09-08 NOTE — ED Provider Notes (Signed)
[facility] Emergency Department Provider Note  ____________________________________________  Time seen: Approximately 5:00 AM  I have reviewed the triage vital signs and the nursing notes.   HISTORY  Chief Complaint Altered Mental Status  Level 5 caveat:  Portions of the history and physical were unable to be obtained due to AMS   HPI Ian Gill is a 74 y.o. male with a history of Parkinson's disease, seizure disorder, stroke, COPD, alcohol use  who presents for altered mental status.  History is gathered mostly from the son.  According to him patient was recently sent back home from rehab after ankle fracture.  He has been home for several days and son thinks that he is not taking his medications and having seizures at home.  Patient has several bruises all over his body, he is confused but is able to follow commands and answer a few questions.  He denies headache, neck pain or back pain, chest pain or abdominal pain.  He reports that he has been having seizures at home.  He is not sure if he is taking his medications.  Past Medical History:  Diagnosis Date  . Anxiety   . COPD (chronic obstructive pulmonary disease) (HCC)   . Depression   . Parkinson's disease (HCC)   . Seizures (HCC)   . Stroke [facility])     Patient Active Problem List   Diagnosis Date Noted  . Postictal state (HCC) 05/31/2019  . Seizure (HCC) 05/30/2019  . Alcohol use 05/30/2019  . Post-ictal state (HCC) 05/30/2019    Past Surgical History:  Procedure Laterality Date  . Shoulder surgery    . SPINAL FUSION     cervical  . TOTAL KNEE ARTHROPLASTY      Prior to Admission medications   Medication Sig Start Date End Date Taking? Authorizing Provider  amLODipine (NORVASC) 10 MG tablet Take 1 tablet (10 mg total) by mouth daily. 06/05/19 07/05/19  Tresa Moore, MD  atorvastatin (LIPITOR) 20 MG tablet Take 1 tablet (20 mg total) by mouth daily. 07/11/15   Sharman Cheek,  MD  clopidogrel (PLAVIX) 75 MG tablet Take 1 tablet (75 mg total) by mouth daily. 07/11/15   Sharman Cheek, MD  divalproex (DEPAKOTE) 250 MG DR tablet Take 3 tablets (750 mg total) by mouth every morning. 06/05/19 07/05/19  Tresa Moore, MD  divalproex (DEPAKOTE) 500 MG DR tablet Take 2 tablets (1,000 mg total) by mouth at bedtime. 06/04/19 07/04/19  Tresa Moore, MD  lamoTRIgine (LAMICTAL) 150 MG tablet Take 1 tablet (150 mg total) by mouth 2 (two) times daily. 06/04/19 07/04/19  Tresa Moore, MD  levETIRAcetam (KEPPRA) 500 MG tablet Take 3 tablets (1,500 mg total) by mouth 2 (two) times daily. 06/04/19 07/04/19  Tresa Moore, MD  lisinopril (ZESTRIL) 20 MG tablet Take 1 tablet (20 mg total) by mouth daily. 06/05/19 07/05/19  Tresa Moore, MD  Melatonin 10 MG TABS Take 1 tablet by mouth at bedtime. Patient not taking: Reported on 05/30/2019 07/11/15   Sharman Cheek, MD  propranolol (INDERAL) 20 MG tablet Take 20 mg by mouth 2 (two) times daily.    [provider]  ranitidine (ZANTAC) 150 MG tablet Take 1 tablet (150 mg total) by mouth daily. Patient not taking: Reported on 05/30/2019 07/11/15   Sharman Cheek, MD  sertraline (ZOLOFT) 100 MG tablet Take 1.5 tablets (150 mg total) by mouth daily. Patient taking differently: Take 200 mg by mouth daily.  07/11/15  Sharman Cheek, MD    Allergies Dilantin [phenytoin sodium extended] and Wellbutrin [bupropion]  Family History  Problem Relation Age of Onset  . COPD Father     Social History Social History   Tobacco Use  . Smoking status: Former Games developer  . Smokeless tobacco: Never Used  Vaping Use  . Vaping Use: Never used  Substance Use Topics  . Alcohol use: Yes    Alcohol/week: 3.0 standard drinks    Types: 2 Shots of liquor, 1 Cans of beer per week  . Drug use: No    Review of Systems  Constitutional: Negative for fever. + confusion Cardiovascular: Negative for chest pain. Respiratory: Negative  for shortness of breath. Gastrointestinal: Negative for abdominal pain, vomiting or diarrhea. Genitourinary: Negative for dysuria. Musculoskeletal: Negative for back pain. Skin: Negative for rash. Neurological: Negative for headaches, weakness or numbness. Psych: No SI or HI  ____________________________________________   PHYSICAL EXAM:  VITAL SIGNS: ED Triage Vitals  Enc Vitals Group     BP 09/07/19 1437 (!) 180/83     Pulse Rate 09/07/19 1437 88     Resp 09/07/19 1437 16     Temp 09/07/19 1437 99.5 F (37.5 C)     Temp Source 09/07/19 1437 Oral     SpO2 09/07/19 1437 95 %     Weight 09/07/19 1438 230 lb (104.3 kg)     Height 09/07/19 1438 5\' 11"  (1.803 m)     Head Circumference --      Peak Flow --      Pain Score 09/07/19 1438 0     Pain Loc --      Pain Edu? --      Excl. in GC? --     Constitutional: Alert and oriented x2, no apparent distress. HEENT:      Head: Normocephalic and atraumatic.         Eyes: Conjunctivae are normal. Sclera is non-icteric.       Mouth/Throat: Mucous membranes are dry.       Neck: Supple with no signs of meningismus.  No C-spine tenderness Cardiovascular: Regular rate and rhythm. No murmurs, gallops, or rubs. 2+ symmetrical distal pulses are present in all extremities. No JVD. Respiratory: Normal respiratory effort. Lungs are clear to auscultation bilaterally. No wheezes, crackles, or rhonchi.  Gastrointestinal: Soft, non tender. Musculoskeletal: Several bruises on extremities with different ages. Full painless range of motion in all joints. No edema, cyanosis, or erythema of extremities. Neurologic: Slurred speech and language. Face is symmetric. Moving all extremities.  Skin: Skin is warm, dry and intact. No rash noted.  ____________________________________________   LABS (all labs ordered are listed, but only abnormal results are displayed)  Labs Reviewed  COMPREHENSIVE METABOLIC PANEL - Abnormal; Notable for the following  components:      Result Value   Glucose, Bld 116 (*)    AST 70 (*)    Total Bilirubin 1.3 (*)    All other components within normal limits  URINALYSIS, COMPLETE (UACMP) WITH MICROSCOPIC - Abnormal; Notable for the following components:   Color, Urine YELLOW (*)    APPearance HAZY (*)    Ketones, ur 80 (*)    Protein, ur 30 (*)    All other components within normal limits  SARS CORONAVIRUS 2 BY RT PCR (HOSPITAL ORDER, PERFORMED IN [facility])  CBC  AMMONIA  URINE DRUG SCREEN, QUALITATIVE ([facility] ONLY)  ETHANOL  LEVETIRACETAM LEVEL  CBG MONITORING, ED   ____________________________________________  EKG  ED ECG REPORT I, Nita Sicklearolina Briscoe Daniello, the attending physician, personally viewed and interpreted this ECG.  Normal sinus rhythm, rate of 62 with first-degree AV block, normal QTC, normal axis, no ST elevations or depressions, diffuse T wave flattening. ____________________________________________  RADIOLOGY  I have personally reviewed the images performed during this visit and I agree with the Radiologist's read.   Interpretation by Radiologist:  CT Head Wo Contrast  Result Date: 09/08/2019 CLINICAL DATA:  Altered mental status.  Decreased responsiveness. EXAM: CT HEAD WITHOUT CONTRAST TECHNIQUE: Contiguous axial images were obtained from the base of the skull through the vertex without intravenous contrast. COMPARISON:  CT head without contrast 05/30/2019 FINDINGS: Brain: Mild atrophy and white matter changes are stable, likely within normal limits for age. No acute infarct, hemorrhage, or mass lesion is present. The ventricles are of normal size. No significant extraaxial fluid collection is present. The brainstem and cerebellum are within normal limits. Vascular: Atherosclerotic calcifications are present within the cavernous internal carotid arteries bilaterally. Skull: Calvarium is intact. No focal lytic or blastic lesions are present. Asymmetric right parietal scalp  soft tissue swelling is present without underlying fracture or foreign body. Sinuses/Orbits: The paranasal sinuses and mastoid air cells are clear. The globes and orbits are within normal limits. IMPRESSION: 1. Asymmetric right parietal scalp soft tissue swelling without underlying fracture or foreign body. Question trauma. 2. Stable atrophy and white matter disease, likely within normal limits for age. 3. Atherosclerosis. Electronically Signed   By: Marin Robertshristopher  Mattern M.D.   On: 09/08/2019 05:38      ____________________________________________   PROCEDURES  Procedure(s) performed:yes .1-3 Lead EKG Interpretation Performed by: Nita SickleVeronese, [place], MD Authorized by: Nita SickleVeronese, [place], MD     Interpretation: non-specific     ECG rate assessment: normal     Rhythm: sinus rhythm     Ectopy: none     Critical Care performed:  None ____________________________________________   INITIAL IMPRESSION / ASSESSMENT AND PLAN / ED COURSE  74 y.o. male with a history of Parkinson's disease, seizure disorder, stroke, COPD, alcohol use  who presents for altered mental status in the setting of possible medication noncompliance and several recent seizures.  Patient is alert to self and place, somnolent but arousable, will follow basic commands and respond to basic questions, he denies any pain at this time.  He is able to move all of his extremities and his face is symmetric.  He has several bruises in his body at different healing stages most likely due to several falls in the setting of seizures.  Will check Keppra levels, will give a dose of IV Keppra, will get a head CT to rule out intracranial bleed.  Clinically no signs of stroke but if CT is negative patient may need an MRI.  Will check labs for any signs of dehydration, sepsis, electrolyte derangements, will check an ammonia level, UDS and ethanol levels.  Unfortunately patient's son left and I am unable to get any more history at this time.  His  old medical records have been reviewed.  Patient was placed on telemetry for close monitoring.  _________________________ 6:15 AM on 09/08/2019 -----------------------------------------  CT showing parietal scalp hematoma but no intracranial bleed.  UDS negative.  Ammonia normal.  Alcohol negative.  UA with no evidence of UTI.  Patient has mild ketonuria possibly from dehydration we will give gentle IV fluids.  Labs with no significant abnormalities.  Review of epic shows a prior admission patient usually takes several days to return to baseline  after having several seizures in a row which is most likely what happened considering the number of bruises the patient has in his body.  Will discuss with the hospitalist for admission.    _____________________________________________ Please note:  Patient was evaluated in Emergency Department today for the symptoms described in the history of present illness. Patient was evaluated in the context of the global COVID-19 pandemic, which necessitated consideration that the patient might be at risk for infection with the SARS-CoV-2 virus that causes COVID-19. Institutional protocols and algorithms that pertain to the evaluation of patients at risk for COVID-19 are in a state of rapid change based on information released by regulatory bodies including the CDC and federal and state organizations. These policies and algorithms were followed during the patient's care in the ED.  Some ED evaluations and interventions may be delayed as a result of limited staffing during the pandemic.   [place] Controlled Substance Database was reviewed by me. ____________________________________________   FINAL CLINICAL IMPRESSION(S) / ED DIAGNOSES   Final diagnoses:  Encephalopathy acute      NEW MEDICATIONS STARTED DURING THIS VISIT:  ED Discharge Orders    None       Note:  This document was prepared using Dragon voice recognition software and may include  unintentional dictation errors.    Don Perking, [place], MD 09/08/19 [phone]

## 2019-09-08 NOTE — ED Notes (Signed)
Pt changed and peri care provided 

## 2019-09-08 NOTE — ED Notes (Signed)
Breakfast tray at bedside, pt denies wanting to eat at this time

## 2019-09-08 NOTE — Progress Notes (Signed)
Patient's son stated Pt was supposed to be seen by podiatry tomorrow for evaluation of his broken right leg that is currently in boot. Podiatry office stated pt could be seen while inpatient tomorrow if consulted. Patient's son very concerned about patient missing this evaluation while in hospital. MD made aware. No new orders obtained.

## 2019-09-08 NOTE — H&P (Signed)
History and Physical    SPIRO AUSBORN NTZ:[id number] DOB: [birth year] DOA: 09/08/2019  Referring MD/NP/PA:   PCP: Barbette Reichmann, MD   Patient coming from:  The patient is coming from independent living facility.  At baseline, pt is partially dependent for most of ADL.        Chief Complaint: AMS, seizure and fall  HPI: Ian Gill is a 74 y.o. male with medical history significant of seizure, hypertension, hyperlipidemia, COPD, stroke, GERD, depression, anxiety, Parkinson's disease, alcohol abuse, who presents with altered mental status and seizure.  Per his son, pt recently had right ankle fracture. He was recently sent back to independent living facility from rehab. Pt was noted to be confused today.  He possibly hadseizure per his son.  He had fall and has severe bruises on his body. His son thinks that he is not taking his medications consistently. When I saw pt in ED, he is confused, he knows his own name, knows that he is in hospital, but is not orientated to the time.  He moves all extremities.  No facial droop or slurred speech.  Patient denies chest pain or abdominal pain.  No active respiratory distress, cough, nausea, vomiting, diarrhea noted. He is wearing boot in left foot.  ED Course: pt was found to have subtherapeutic level of valproic acid at 11 (therapeutic range should be 50-100), pending the level of Lamictal and Depakote.  WBC 6.8, negative UDS, negative COVID-19 PCR, alcohol level less than 10, ammonia level 18, electrolytes renal function okay, temperature normal, elevated blood pressure 193/79, tachycardia, RR 16, oxygen saturation 95% on room air.  Chest x-ray showed vascular crowding with low lung volume.  CT head is negative for acute intracranial abnormalities.  Patient is admitted to MedSurg bed as inpatient.   Review of Systems: Could not be reviewed accurately due to altered mental status.   Allergy:  Allergies  Allergen Reactions  . Bupropion Other  (See Comments)    Seizures  Seizure seizures Seizure   . Dilantin [Phenytoin Sodium Extended]     Past issues with toxicity per pt's son  . Iodinated Diagnostic Agents Swelling    Past Medical History:  Diagnosis Date  . Anxiety   . COPD (chronic obstructive pulmonary disease) (HCC)   . Depression   . Parkinson's disease (HCC)   . Seizures (HCC)   . Stroke [facility])     Past Surgical History:  Procedure Laterality Date  . Shoulder surgery    . SPINAL FUSION     cervical  . TOTAL KNEE ARTHROPLASTY      Social History:  reports that he has quit smoking. He has never used smokeless tobacco. He reports current alcohol use of about 3.0 standard drinks of alcohol per week. He reports that he does not use drugs.  Family History:  Family History  Problem Relation Age of Onset  . COPD Father      Prior to Admission medications   Medication Sig Start Date End Date Taking? Authorizing Provider  amLODipine (NORVASC) 10 MG tablet Take 1 tablet (10 mg total) by mouth daily. 06/05/19 07/05/19  Tresa Moore, MD  atorvastatin (LIPITOR) 20 MG tablet Take 1 tablet (20 mg total) by mouth daily. 07/11/15   Sharman Cheek, MD  clopidogrel (PLAVIX) 75 MG tablet Take 1 tablet (75 mg total) by mouth daily. 07/11/15   Sharman Cheek, MD  divalproex (DEPAKOTE) 250 MG DR tablet Take 3 tablets (750 mg total) by mouth every morning.  06/05/19 07/05/19  Tresa Moore, MD  divalproex (DEPAKOTE) 500 MG DR tablet Take 2 tablets (1,000 mg total) by mouth at bedtime. 06/04/19 07/04/19  Tresa Moore, MD  lamoTRIgine (LAMICTAL) 150 MG tablet Take 1 tablet (150 mg total) by mouth 2 (two) times daily. 06/04/19 07/04/19  Tresa Moore, MD  levETIRAcetam (KEPPRA) 500 MG tablet Take 3 tablets (1,500 mg total) by mouth 2 (two) times daily. 06/04/19 07/04/19  Tresa Moore, MD  lisinopril (ZESTRIL) 20 MG tablet Take 1 tablet (20 mg total) by mouth daily. 06/05/19 07/05/19  Tresa Moore, MD    Melatonin 10 MG TABS Take 1 tablet by mouth at bedtime. Patient not taking: Reported on 05/30/2019 07/11/15   Sharman Cheek, MD  propranolol (INDERAL) 20 MG tablet Take 20 mg by mouth 2 (two) times daily.    [provider]  ranitidine (ZANTAC) 150 MG tablet Take 1 tablet (150 mg total) by mouth daily. Patient not taking: Reported on 05/30/2019 07/11/15   Sharman Cheek, MD  sertraline (ZOLOFT) 100 MG tablet Take 1.5 tablets (150 mg total) by mouth daily. Patient taking differently: Take 200 mg by mouth daily.  07/11/15   Sharman Cheek, MD    Physical Exam: Vitals:   09/08/19 1230 09/08/19 1300 09/08/19 1330 09/08/19 1456  BP: (!) 134/112 (!) 147/71 131/70 (!) 177/82  Pulse: 93 83 84 79  Resp:   18   Temp:      TempSrc:      SpO2: 93% 93% 94%   Weight:      Height:       General: Not in acute distress HEENT:       Eyes: PERRL, EOMI, no scleral icterus.       ENT: No discharge from the ears and nose, no pharynx injection, no tonsillar enlargement.        Neck: No JVD, no bruit, no mass felt. Heme: No neck lymph node enlargement. Cardiac: S1/S2, RRR, No murmurs, No gallops or rubs. Respiratory: No rales, wheezing, rhonchi or rubs. GI: Soft, nondistended, nontender, no organomegaly, BS present. GU: No hematuria Ext: No pitting leg edema bilaterally. Wearing boot in right foot. Musculoskeletal: No joint deformities, No joint redness or warmth, no limitation of ROM in spin. Skin: No rashes. Has bruises. Neuro: confused, knows his own name, knows that he is in hospital, but is not orientated to the time. Cranial nerves II-XII grossly intact, moves all extremities. Psych: Patient is not psychotic, no suicidal or hemocidal ideation.  Labs on Admission: I have personally reviewed following labs and imaging studies  CBC: Recent Labs  Lab 09/06/19 0743 09/07/19 1445  WBC 5.4 6.8  HGB 14.3 14.6  HCT 40.7 42.0  MCV 94.9 94.8  PLT 173 187   Basic Metabolic  Panel: Recent Labs  Lab 09/06/19 0743 09/07/19 1445  NA 137 140  K 4.2 4.1  CL 99 101  CO2 27 24  GLUCOSE 102* 116*  BUN 13 12  CREATININE 0.85 0.85  CALCIUM 9.4 9.6   GFR: Estimated Creatinine Clearance: 92.3 mL/min (by C-G formula based on SCr of 0.85 mg/dL). Liver Function Tests: Recent Labs  Lab 09/07/19 1445  AST 70*  ALT 21  ALKPHOS 56  BILITOT 1.3*  PROT 7.2  ALBUMIN 4.5   No results for input(s): LIPASE, AMYLASE in the last 168 hours. Recent Labs  Lab 09/08/19 0429  AMMONIA 18   Coagulation Profile: No results for input(s): INR, PROTIME in the last 168  hours. Cardiac Enzymes: No results for input(s): CKTOTAL, CKMB, CKMBINDEX, TROPONINI in the last 168 hours. BNP (last 3 results) No results for input(s): PROBNP in the last 8760 hours. HbA1C: No results for input(s): HGBA1C in the last 72 hours. CBG: No results for input(s): GLUCAP in the last 168 hours. Lipid Profile: No results for input(s): CHOL, HDL, LDLCALC, TRIG, CHOLHDL, LDLDIRECT in the last 72 hours. Thyroid Function Tests: No results for input(s): TSH, T4TOTAL, FREET4, T3FREE, THYROIDAB in the last 72 hours. Anemia Panel: No results for input(s): VITAMINB12, FOLATE, FERRITIN, TIBC, IRON, RETICCTPCT in the last 72 hours. Urine analysis:    Component Value Date/Time   COLORURINE YELLOW (A) 09/08/2019 0429   APPEARANCEUR HAZY (A) 09/08/2019 0429   APPEARANCEUR Clear 02/04/2014 1838   LABSPEC 1.020 09/08/2019 0429   LABSPEC 1.004 02/04/2014 1838   PHURINE 5.0 09/08/2019 0429   GLUCOSEU NEGATIVE 09/08/2019 0429   GLUCOSEU Negative 02/04/2014 1838   HGBUR NEGATIVE 09/08/2019 0429   BILIRUBINUR NEGATIVE 09/08/2019 0429   BILIRUBINUR Negative 02/04/2014 1838   KETONESUR 80 (A) 09/08/2019 0429   PROTEINUR 30 (A) 09/08/2019 0429   NITRITE NEGATIVE 09/08/2019 0429   LEUKOCYTESUR NEGATIVE 09/08/2019 0429   LEUKOCYTESUR Negative 02/04/2014 1838   Sepsis  Labs: (procalcitonin:4,lacticidven:4) ) Recent Results (from the past 240 hour(s))  SARS Coronavirus 2 by RT PCR (hospital order, performed in [facility] hospital lab) Nasopharyngeal Nasopharyngeal Swab     Status: None   Collection Time: 09/08/19  7:36 AM   Specimen: Nasopharyngeal Swab  Result Value Ref Range Status   SARS Coronavirus 2 NEGATIVE NEGATIVE Final    Comment: (NOTE) SARS-CoV-2 target nucleic acids are NOT DETECTED.  The SARS-CoV-2 RNA is generally detectable in upper and lower respiratory specimens during the acute phase of infection. The lowest concentration of SARS-CoV-2 viral copies this assay can detect is 250 copies / mL. A negative result does not preclude SARS-CoV-2 infection and should not be used as the sole basis for treatment or other patient management decisions.  A negative result may occur with improper specimen collection / handling, submission of specimen other than nasopharyngeal swab, presence of viral mutation(s) within the areas targeted by this assay, and inadequate number of viral copies (<250 copies / mL). A negative result must be combined with clinical observations, patient history, and epidemiological information.  Fact Sheet for Patients:   BoilerBrush.com.cy  Fact Sheet for Healthcare Providers: https://pope.com/  This test is not yet approved or  cleared by the [place] FDA and has been authorized for detection and/or diagnosis of SARS-CoV-2 by FDA under an Emergency Use Authorization (EUA).  This EUA will remain in effect (meaning this test can be used) for the duration of the COVID-19 declaration under Section 564(b)(1) of the Act, 21 U.S.C. section 360bbb-3(b)(1), unless the authorization is terminated or revoked sooner.  Performed at [facility], [address] [place], [place] [address]      Radiological Exams on Admission: CT Head Wo  Contrast  Result Date: 09/08/2019 CLINICAL DATA:  Altered mental status.  Decreased responsiveness. EXAM: CT HEAD WITHOUT CONTRAST TECHNIQUE: Contiguous axial images were obtained from the base of the skull through the vertex without intravenous contrast. COMPARISON:  CT head without contrast 05/30/2019 FINDINGS: Brain: Mild atrophy and white matter changes are stable, likely within normal limits for age. No acute infarct, hemorrhage, or mass lesion is present. The ventricles are of normal size. No significant extraaxial fluid collection is present. The brainstem and cerebellum are  within normal limits. Vascular: Atherosclerotic calcifications are present within the cavernous internal carotid arteries bilaterally. Skull: Calvarium is intact. No focal lytic or blastic lesions are present. Asymmetric right parietal scalp soft tissue swelling is present without underlying fracture or foreign body. Sinuses/Orbits: The paranasal sinuses and mastoid air cells are clear. The globes and orbits are within normal limits. IMPRESSION: 1. Asymmetric right parietal scalp soft tissue swelling without underlying fracture or foreign body. Question trauma. 2. Stable atrophy and white matter disease, likely within normal limits for age. 3. Atherosclerosis. Electronically Signed   By: Marin Roberts M.D.   On: 09/08/2019 05:38   DG Chest Portable 1 View  Result Date: 09/08/2019 CLINICAL DATA:  Altered mental status. EXAM: PORTABLE CHEST 1 VIEW COMPARISON:  09/06/2019 FINDINGS: The cardiac silhouette, mediastinal and hilar contours are within normal limits and stable. Low lung volumes with vascular crowding and streaky atelectasis but no infiltrates or effusions. The bony thorax is intact. IMPRESSION: Low lung volumes with vascular crowding and streaky atelectasis. Electronically Signed   By: Rudie Meyer M.D.   On: 09/08/2019 06:19     EKG: Independently reviewed.  Sinus rhythm, QTC 436, low voltage, occasional PAC,  nonspecific T wave change  Assessment/Plan Principal Problem:   Seizure (HCC) Active Problems:   Alcohol use   COPD (chronic obstructive pulmonary disease) (HCC)   Depression   Stroke (HCC)   HLD (hyperlipidemia)   GERD (gastroesophageal reflux disease)   HTN (hypertension)   Acute metabolic encephalopathy   Hypertensive urgency   Fall   Seizure and acute metabolic encephalopathy: CT of head is negative for acute intracranial abnormalities.  Patient seizure is likely due to medication noncompliance.  He is valproic acid level is subtherapeutic.  Pending level of Keppra and Lamictal.  Patient's altered mental status likely due to postictal status.  -Admit to MedSurg bed as inpatient -Seizure precaution -When necessary Ativan for seizure -Continue home Lamictal -Give IV Keppra 1500 milligrams twice daily -Give IV Depakote 750 mg in morning and 1000 mg p.m.  Alcohol use: Per patient's son, patient did not drink alcohol for more than 6 weeks, no signs of withdrawal currently -Monitoring for any signs of withdrawal  COPD (chronic obstructive pulmonary disease) (HCC): Stable -Bronchodilators  Depression -Continue home medications  Stroke (HCC) -Plavix, Lipitor  HLD (hyperlipidemia) -Lipitor  GERD (gastroesophageal reflux disease) -Pepcid  HTN (hypertension) and Hypertensive urgency: Blood pressure 193/79 -IV hydralazine as needed -Amlodipine, lisinopril, propranolol  Fall: Secondary to seizure possibly -PT/OT      DVT ppx:   SQ Lovenox Code Status: DNR per his son who is the POA Family Communication: Yes, patient's son by phone Disposition Plan:  Anticipate discharge back to previous independent living facility Consults called:  none Admission status: Med-surg bed as inpt      Status is: Inpatient  Remains inpatient appropriate because:Inpatient level of care appropriate due to severity of illness.  Patient has multiple comorbidities, now presents with  altered mental status, seizure, fall.  Patient also has hypertensive urgency.  His presentation is highly complicated.  Patient is at high risk for deteriorating.  Will need to be treated in the hospital for at least 2 days.   Dispo: The patient is from: Independent living facility              Anticipated d/c is to: Independent living facility              Anticipated d/c date is: 2 days  Patient currently is not medically stable to d/c.          Date of Service 09/08/2019    Lorretta HarpXilin Akul Leggette [facility]   If 7PM-7AM, please contact night-coverage [email or web address] 09/08/2019, 3:50 PM

## 2019-09-08 NOTE — Progress Notes (Signed)
Pt arrived to floor via stretcher from ER in stable condition

## 2019-09-09 ENCOUNTER — Inpatient Hospital Stay: Admit: 2019-09-09 | Payer: Medicare Other

## 2019-09-09 ENCOUNTER — Inpatient Hospital Stay: Payer: Medicare Other

## 2019-09-09 ENCOUNTER — Encounter
Admission: RE | Admit: 2019-09-09 | Discharge: 2019-09-09 | Disposition: A | Discharge status: Home / Self Care | Payer: Medicare Other | Source: Ambulatory Visit | Attending: Internal Medicine | Admitting: Internal Medicine

## 2019-09-09 DIAGNOSIS — J449 Chronic obstructive pulmonary disease, unspecified: Secondary | ICD-10-CM | POA: Diagnosis not present

## 2019-09-09 LAB — BASIC METABOLIC PANEL
Anion gap: 9 (ref 5–15)
BUN: 14 mg/dL (ref 8–23)
CO2: 26 mmol/L (ref 22–32)
Calcium: 8.7 mg/dL — ABNORMAL LOW (ref 8.9–10.3)
Chloride: 101 mmol/L (ref 98–111)
Creatinine, Ser: 0.78 mg/dL (ref 0.61–1.24)
GFR calc Af Amer: >60 mL/min (ref >60)
GFR calc non Af Amer: >60 mL/min (ref >60)
Glucose, Bld: 99 mg/dL (ref 70–99)
Potassium: 3.7 mmol/L (ref 3.5–5.1)
Sodium: 136 mmol/L (ref 135–145)

## 2019-09-09 LAB — LAMOTRIGINE LEVEL: Lamotrigine Lvl: 10.7 ug/mL (ref 2.0–20.0)

## 2019-09-09 LAB — CBC
HCT: 35.9 % — ABNORMAL LOW (ref 39.0–52.0)
Hemoglobin: 13 g/dL (ref 13.0–17.0)
MCH: 33.9 pg (ref 26.0–34.0)
MCHC: 36.2 g/dL — ABNORMAL HIGH (ref 30.0–36.0)
MCV: 93.5 fL (ref 80.0–100.0)
Platelets: 181 10*3/uL (ref 150–400)
RBC: 3.84 MIL/uL — ABNORMAL LOW (ref 4.22–5.81)
RDW: 11.9 % (ref 11.5–15.5)
WBC: 5.9 10*3/uL (ref 4.0–10.5)
nRBC: 0 % (ref 0.0–0.2)

## 2019-09-09 IMAGING — DX DG ANKLE COMPLETE 3+V*R*
3 series · 3 of 3 positions shown · non-contrast
Comparison: None.

CLINICAL DATA: Fell 2-3 weeks ago. Persistent pain and swelling.

EXAM:
RIGHT ANKLE - COMPLETE 3+ VIEW

[ankle ap]
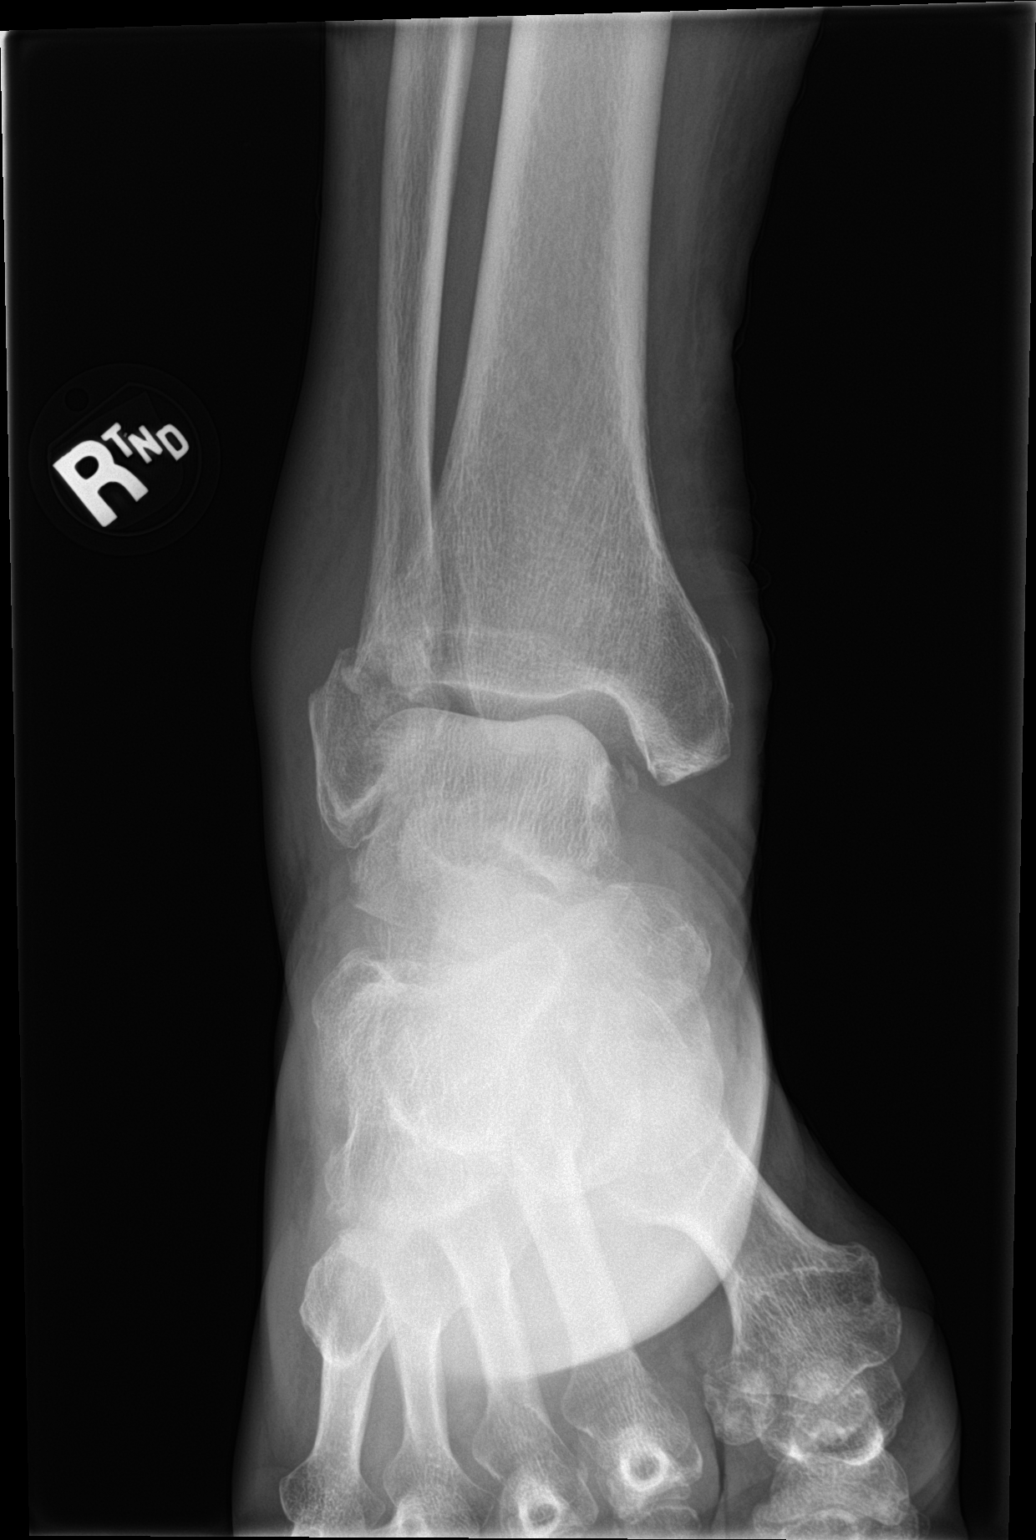

[ankle obl]
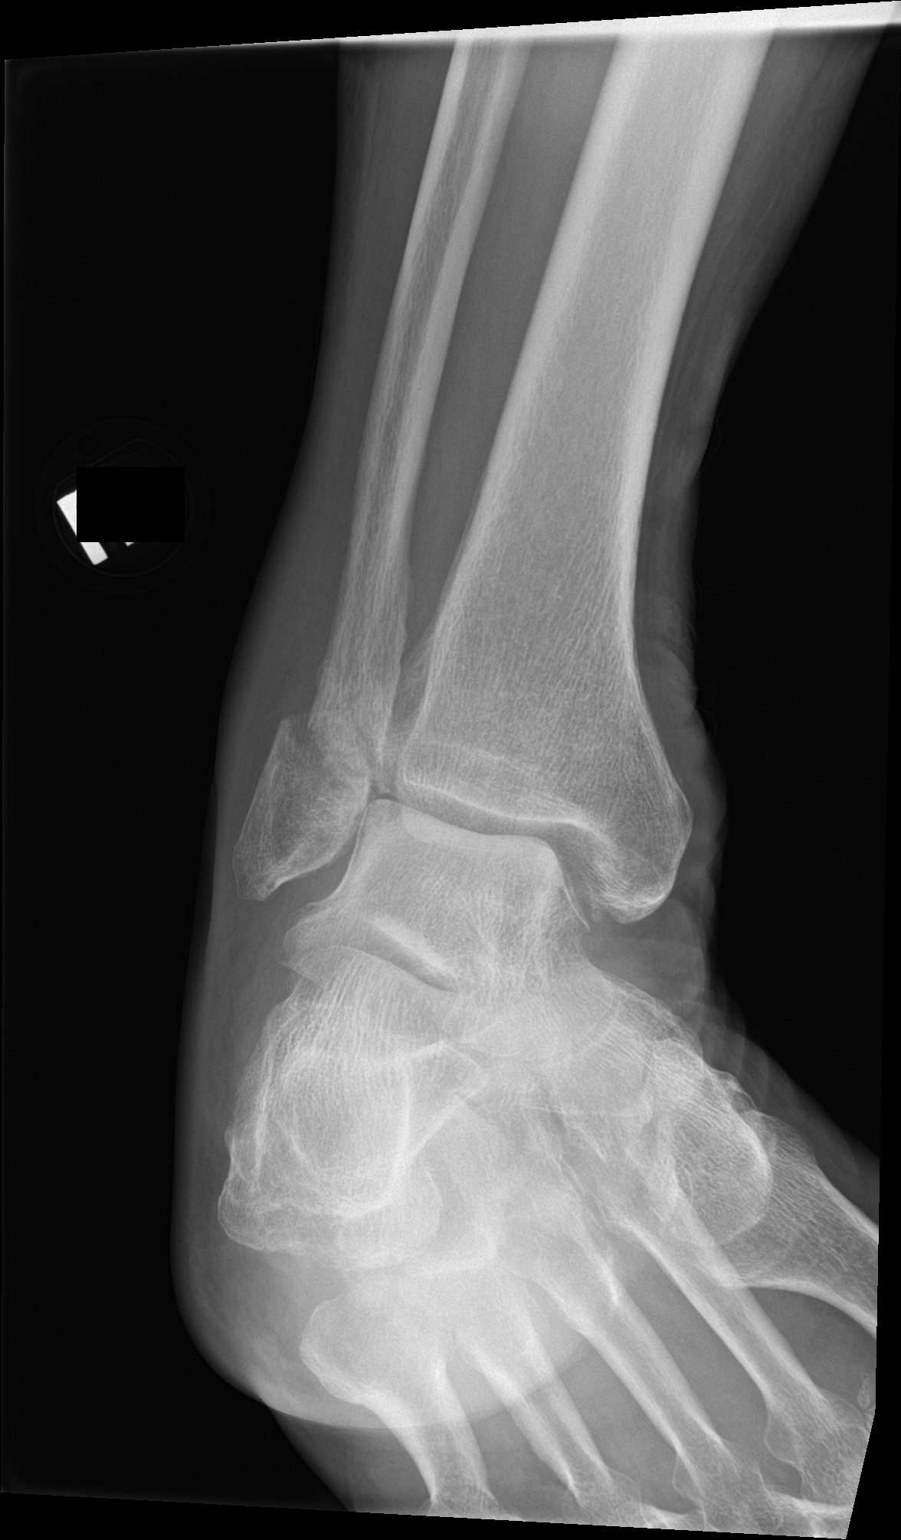

[ankle lat]
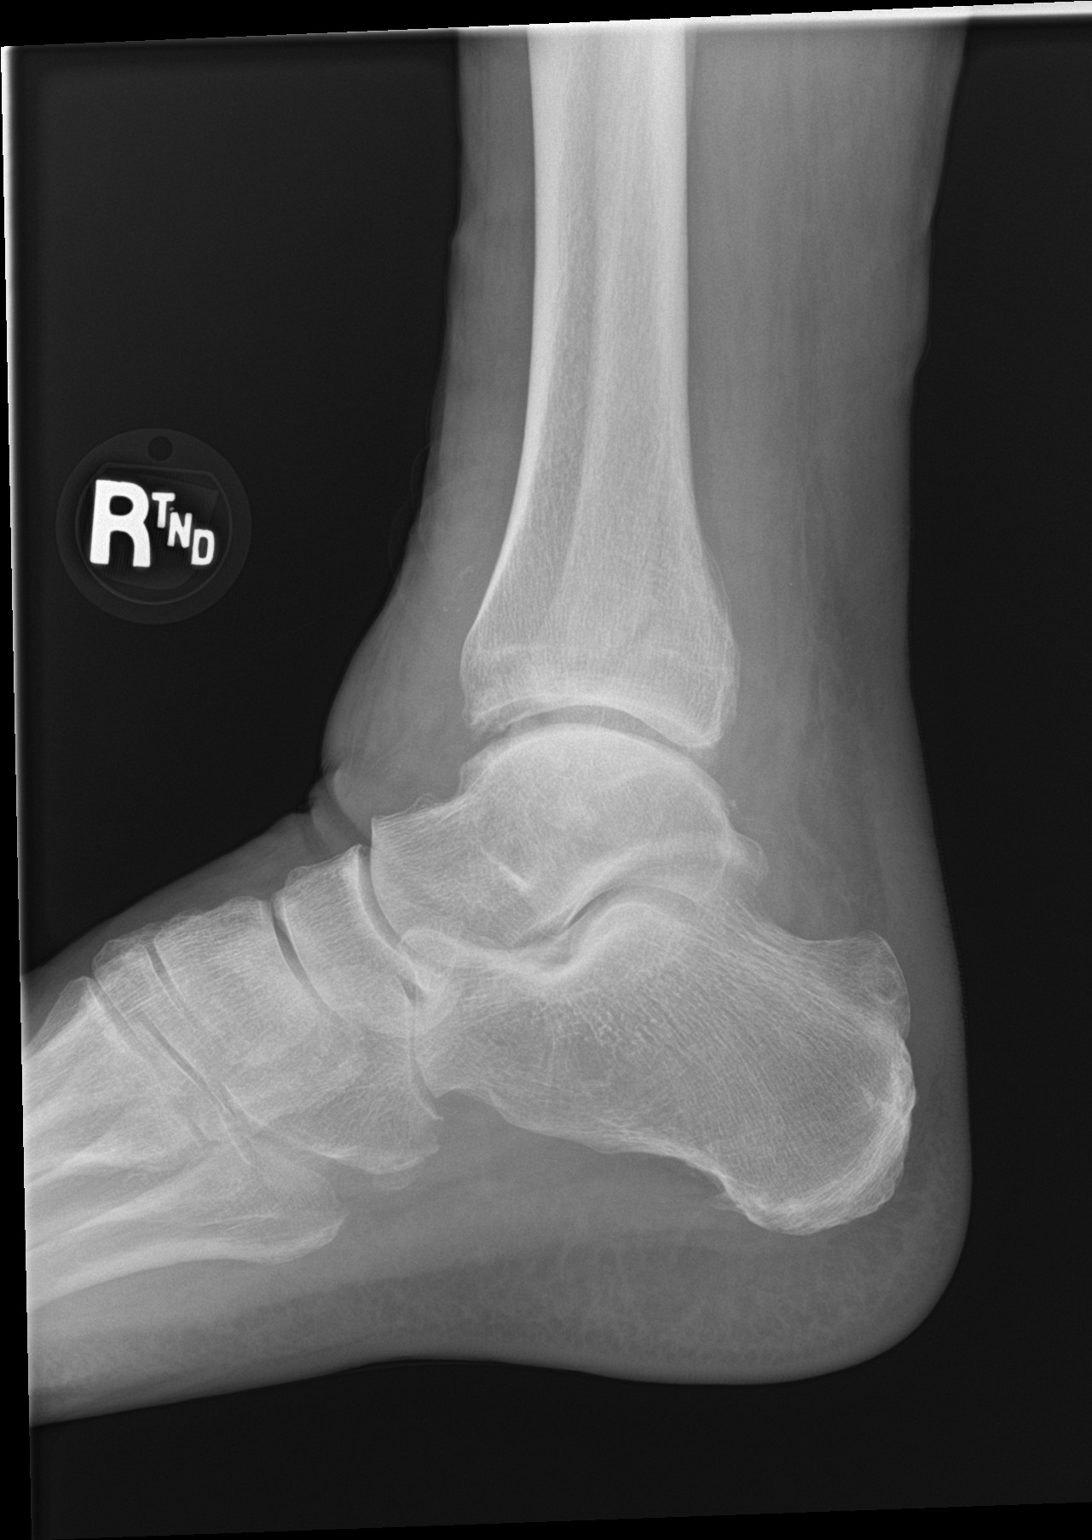

[3 of 3 positions shown; findings below may reference images not displayed]

FINDINGS: There is a mildly displaced oblique fracture involving the distal
fibula at and above the level of the ankle mortise. The tibia is
intact. No fracture of the medial malleolus. Mild ankle joint
degenerative changes. Ankle mortise is maintained.

The mid and hindfoot bony structures are intact.

Extensive soft tissue swelling mainly around the lateral aspect of
the ankle.
IMPRESSION: Mildly displaced distal fibular fracture.

## 2019-09-09 IMAGING — MR MR HEAD W/O CM
13 series · 44 of 48 positions shown · non-contrast
Comparison: [DATE] head CT and prior.  [DATE] MRI head.

CLINICAL DATA: Delirium

EXAM:
MRI HEAD WITHOUT CONTRAST
TECHNIQUE: Multiplanar, multiecho pulse sequences of the brain and surrounding
structures were obtained without intravenous contrast.

[Series 5: ax dwi_tracew · axial · 3.0mm · 1.31mm/px · z∈[-47,+118]mm · 3 of 52 slices shown]
[im 1/52]
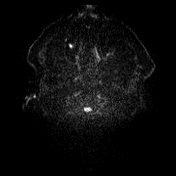
[im 26/52]
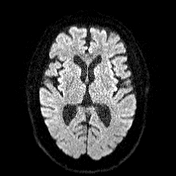
[im 52/52]
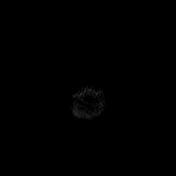

[Series 6: ax dwi_adc · axial · 3.0mm · 1.31mm/px · z∈[-47,+118]mm · 3 of 52 slices shown]
[im 1/52]
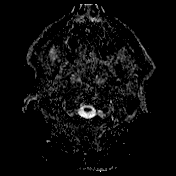
[im 26/52]
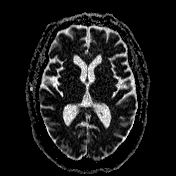
[im 52/52]
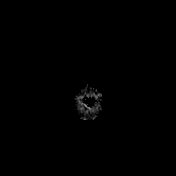

[Series 7: cor dwi_tracew · coronal · 5.0mm · 1.31mm/px · 3 of 42 slices shown]
[im 1/42]
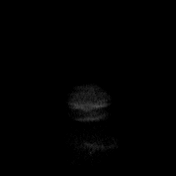
[im 21/42]
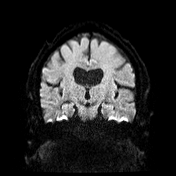
[im 42/42]
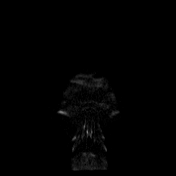

[Series 8: cor dwi_adc · coronal · 5.0mm · 1.31mm/px · 3 of 42 slices shown]
[im 1/42]
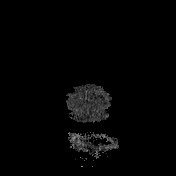
[im 21/42]
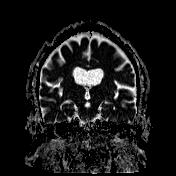
[im 42/42]
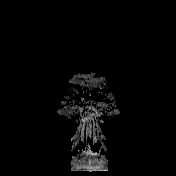

[Series 9: T1 · sagittal · 5.0mm · 0.94mm/px · 1 of 23 slices shown (1 of 2)]
[im 1/23]
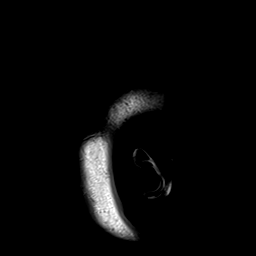

[Series 10: T2 · axial · 5.0mm · 0.53mm/px · z∈[-46,+119]mm · 2 of 29 slices shown (1 of 2)]
[im 1/29]
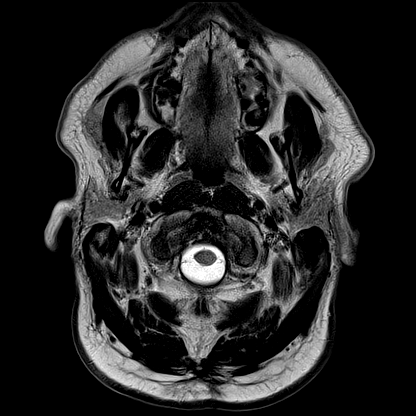
[im 29/29]
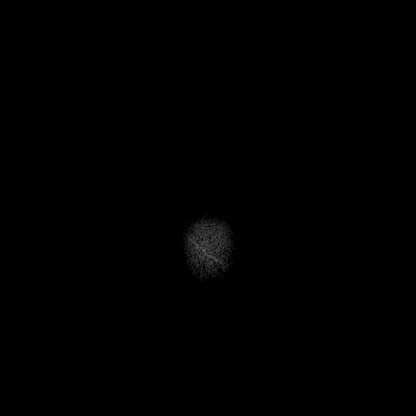

[Series 12: pha_images · axial · 4.0mm · 0.90mm/px · z∈[-47,+122]mm · 3 of 44 slices shown]
[im 1/44]
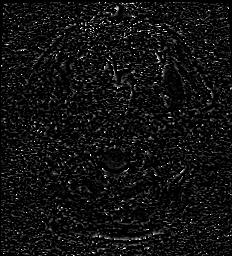
[im 22/44]
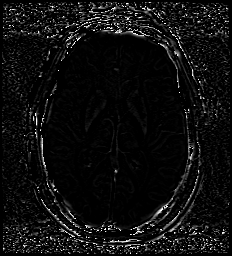
[im 44/44]
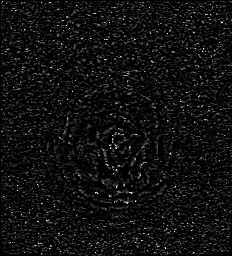

[Series 13: swi_images · axial · 4.0mm · 0.90mm/px · z∈[-47,+122]mm · 3 of 44 slices shown]
[im 1/44]
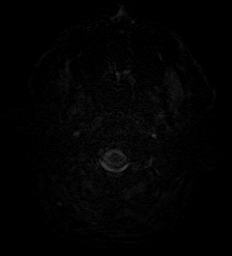
[im 22/44]
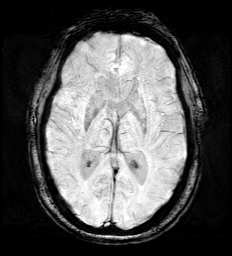
[im 44/44]
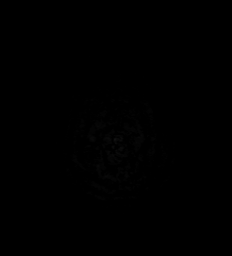

[Series 15: FLAIR · axial · 3.0mm · 0.53mm/px · z∈[-43,+116]mm · 4 of 55 slices shown (1 of 2)]
[im 1/55]
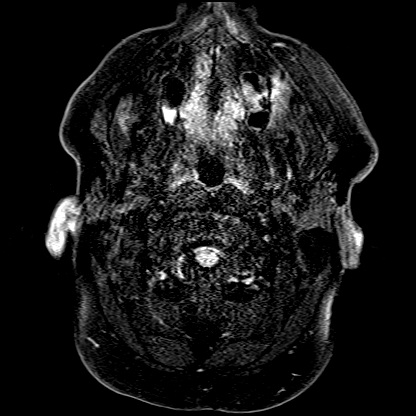
[im 19/55]
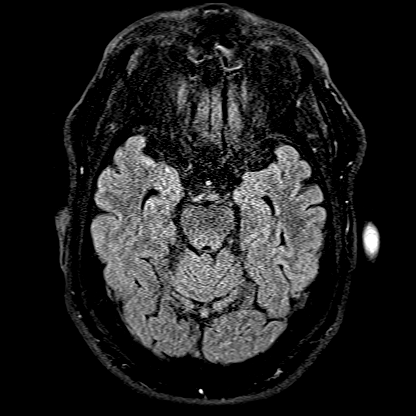
[im 37/55]
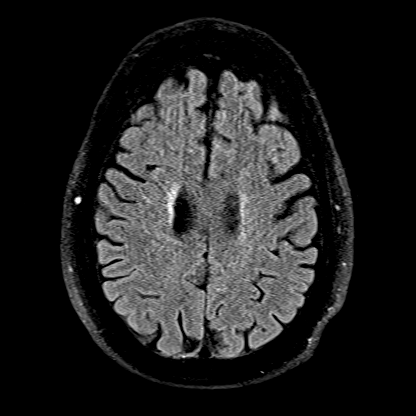
[im 55/55]
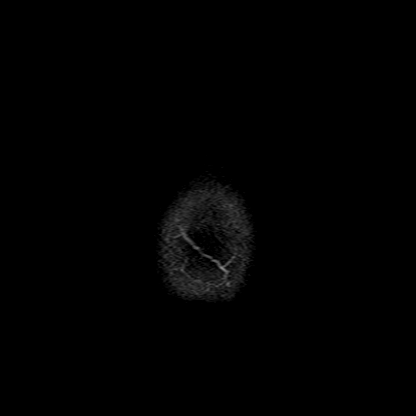

[Series 16: T1 · axial · 1.0mm · 0.98mm/px · z∈[-48,+138]mm · 8 of 187 slices shown (2 of 2)]
[im 1/187]
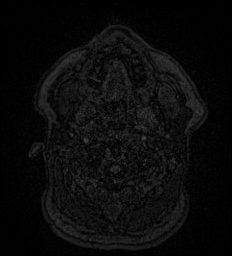
[im 34/187]
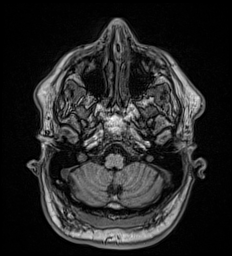
[im 51/187]
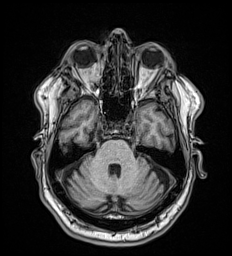
[im 85/187]
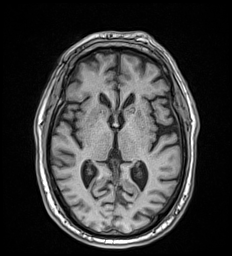
[im 102/187]
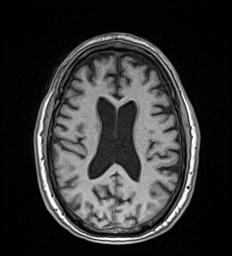
[im 136/187]
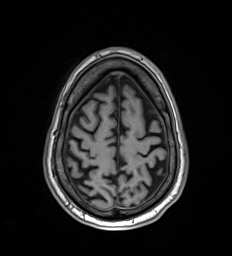
[im 153/187]
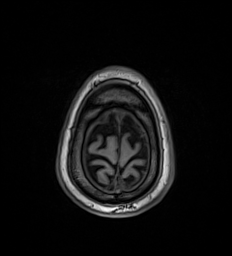
[im 187/187]
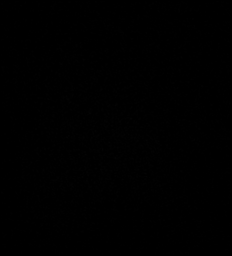

[Series 17: T2 · coronal · 3.0mm · 0.47mm/px · 2 of 35 slices shown (2 of 2)]
[im 1/35]
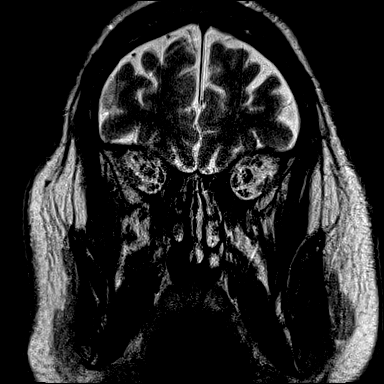
[im 35/35]
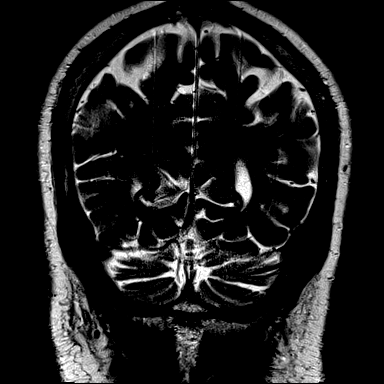

[Series 18: FLAIR · coronal · 3.0mm · 0.47mm/px · 2 of 35 slices shown (2 of 2)]
[im 1/35]
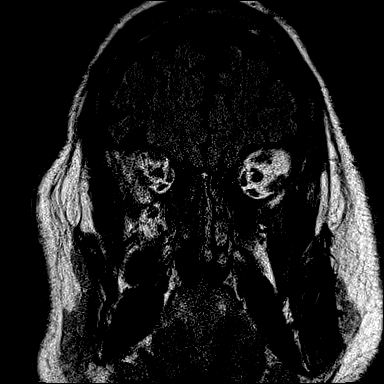
[im 35/35]
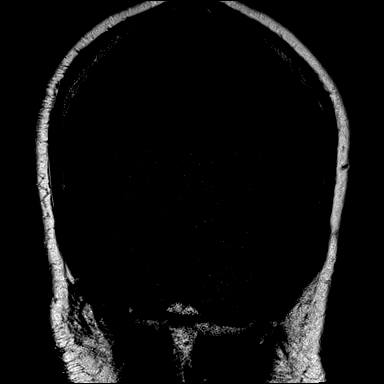

[Series 1019: cor thins rl · coronal · 3.0mm · 0.49mm/px · 7 of 107 slices shown]
[im 1/107]
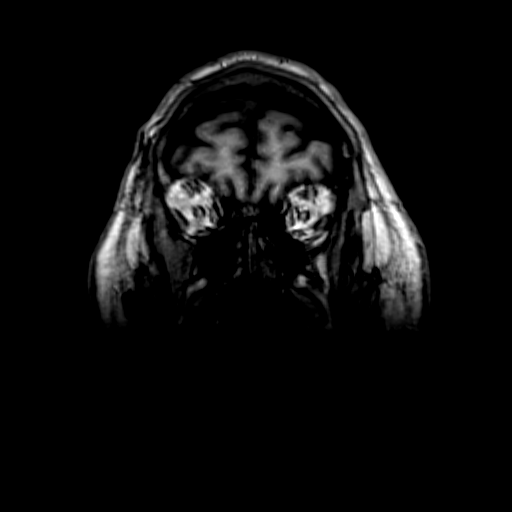
[im 18/107]
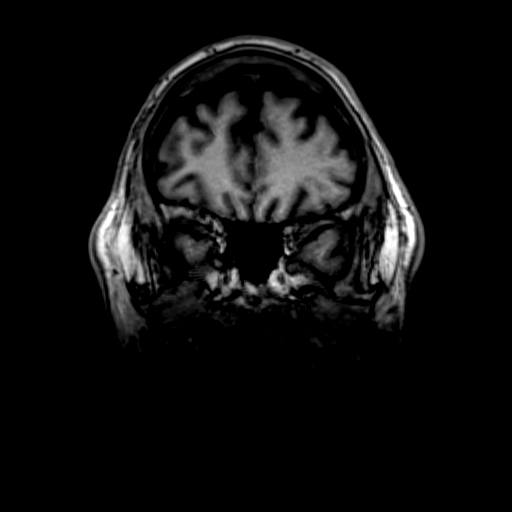
[im 36/107]
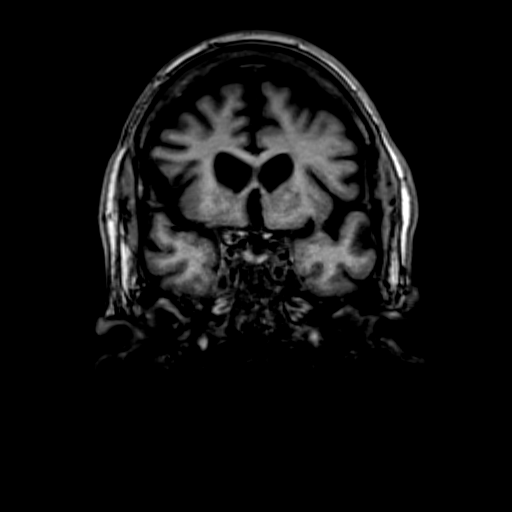
[im 54/107]
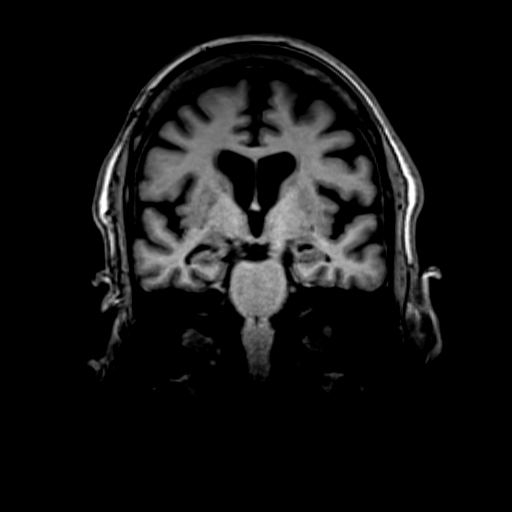
[im 71/107]
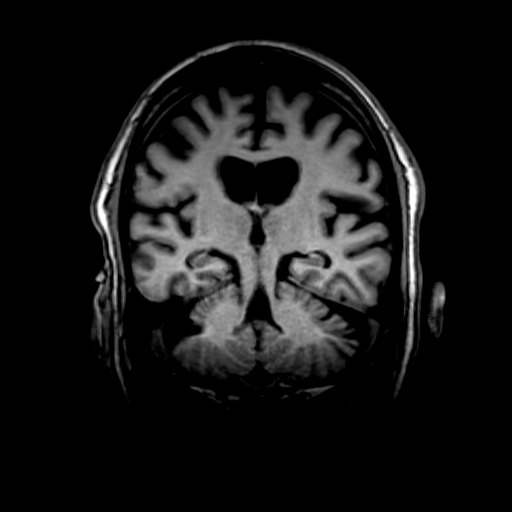
[im 89/107]
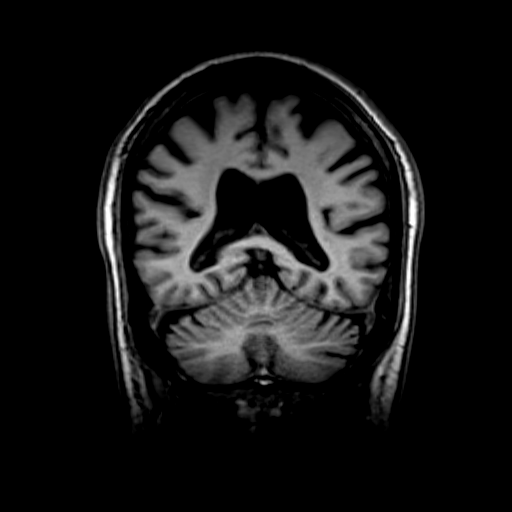
[im 107/107]
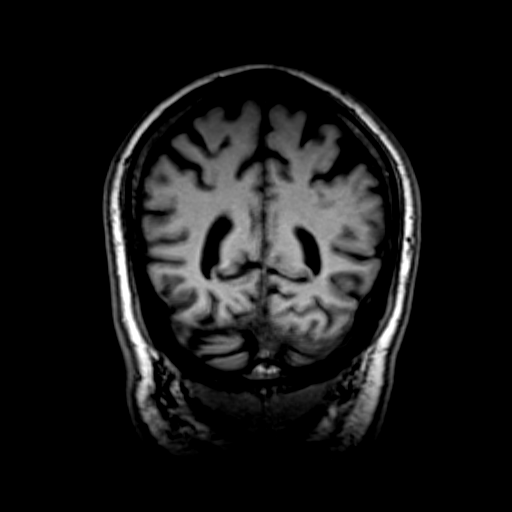

[44 of 48 positions shown; findings below may reference images not displayed]

FINDINGS: Please note that motion artifact limits evaluation.

Brain: Few scattered T2 hyperintense foci involving the
periventricular and subcortical white matter are nonspecific however
commonly associated with chronic microvascular ischemic changes.
Punctate left corona radiata hyperdense foci on DWI imaging ([DATE],
[DATE] reflect tiny subacute infarcts versus artifact. Sequela of
remote right frontal microhemorrhage. No midline shift,
ventriculomegaly or extra-axial fluid collection. No mass lesion.
Mild cerebral atrophy with ex vacuo dilatation.

The bilateral hippocampi are symmetric demonstrating normal
morphology and signal intensity.

Vascular: Normal flow voids.

Skull and upper cervical spine: Normal marrow signal.

Sinuses/Orbits: Normal orbits. Minimal left maxillary sinus mucosal
thickening. No mastoid effusion.

Other: None.
IMPRESSION: Punctate left corona radiata DWI hyperintensities may reflect tiny
subacute infarcts versus artifact.

Remote small right frontal microhemorrhage.

Mild cerebral atrophy. Minimal chronic microvascular ischemic
changes.

Normal appearance of the hippocampal complexes.

## 2019-09-09 IMAGING — CR DG RIBS 2V*L*
4 series · 4 of 4 positions shown · non-contrast
Comparison: [DATE]

CLINICAL DATA: Frequent falls at home. History of seizures and
Parkinson's disease. Pain under the left breast.

EXAM:
LEFT RIBS - 2 VIEW

[rib ap (1 of 2)]
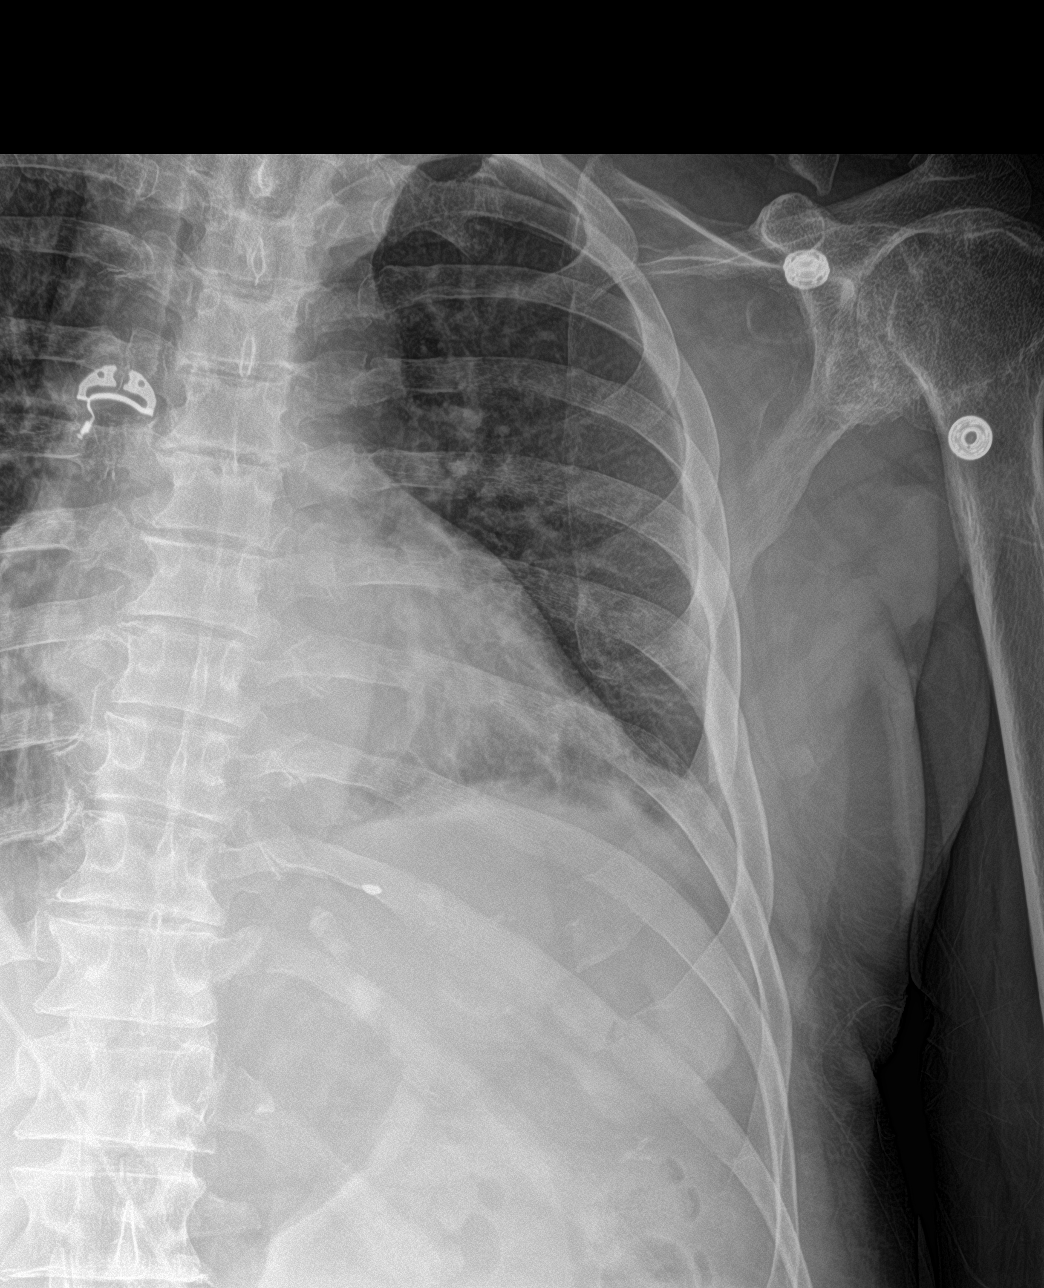

[rib ap (2 of 2)]
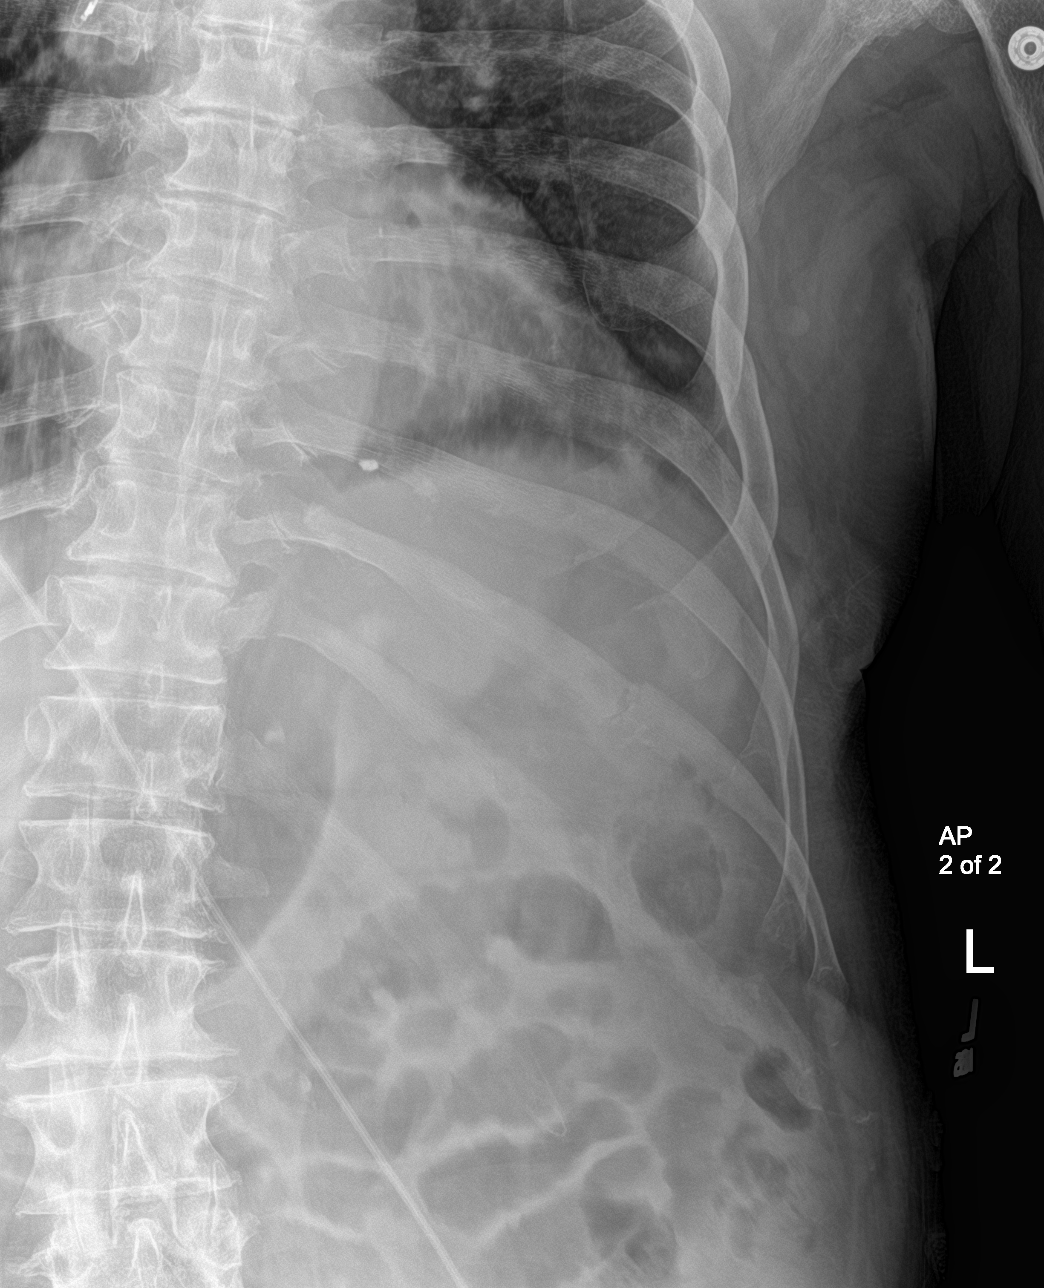

[rib ap obl (1 of 2)]
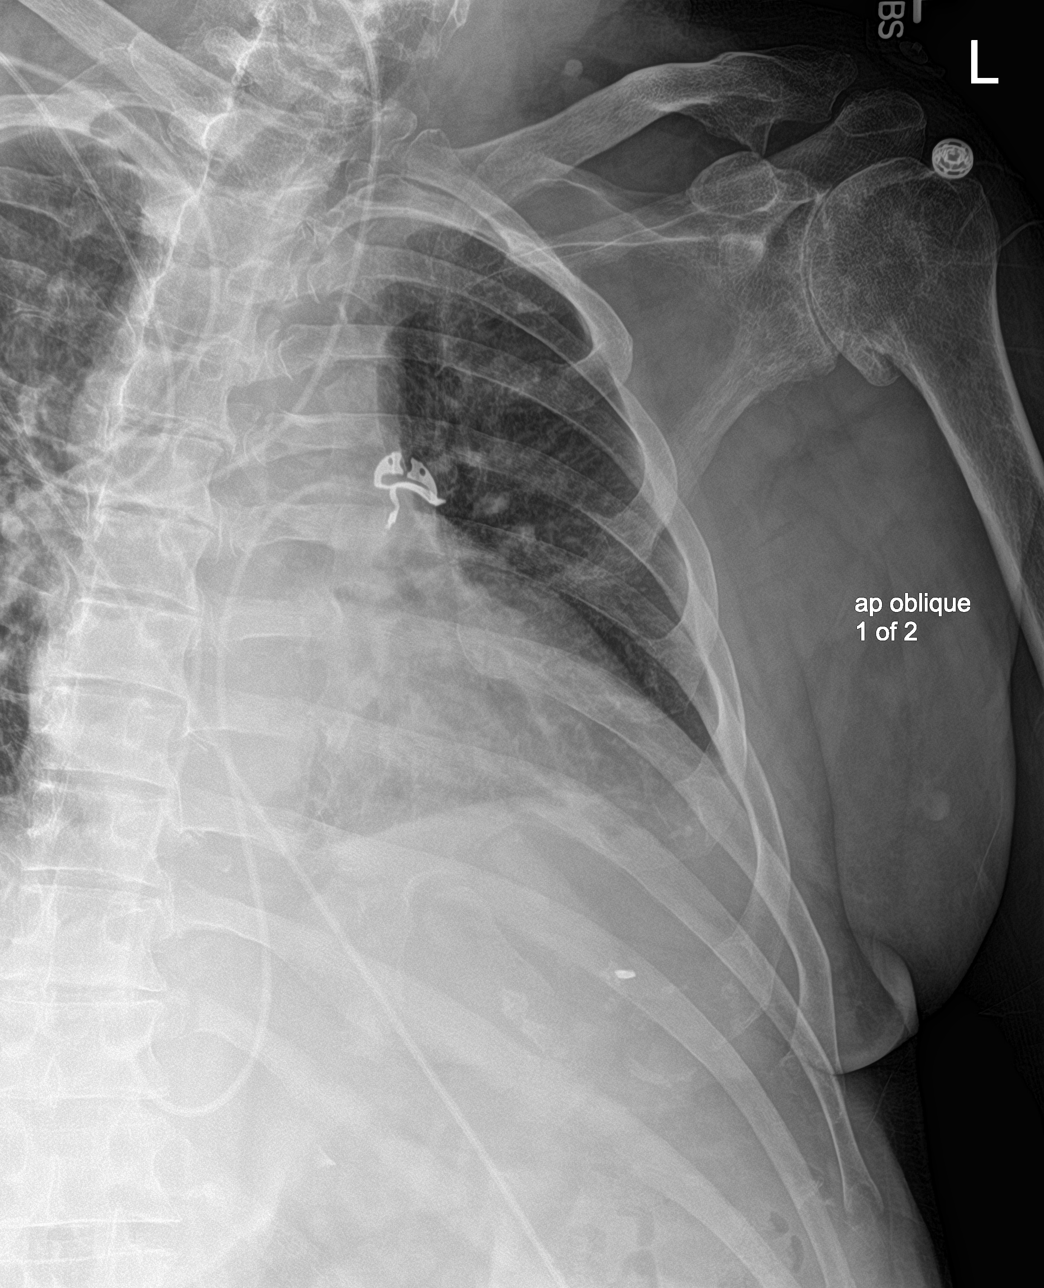

[rib ap obl (2 of 2)]
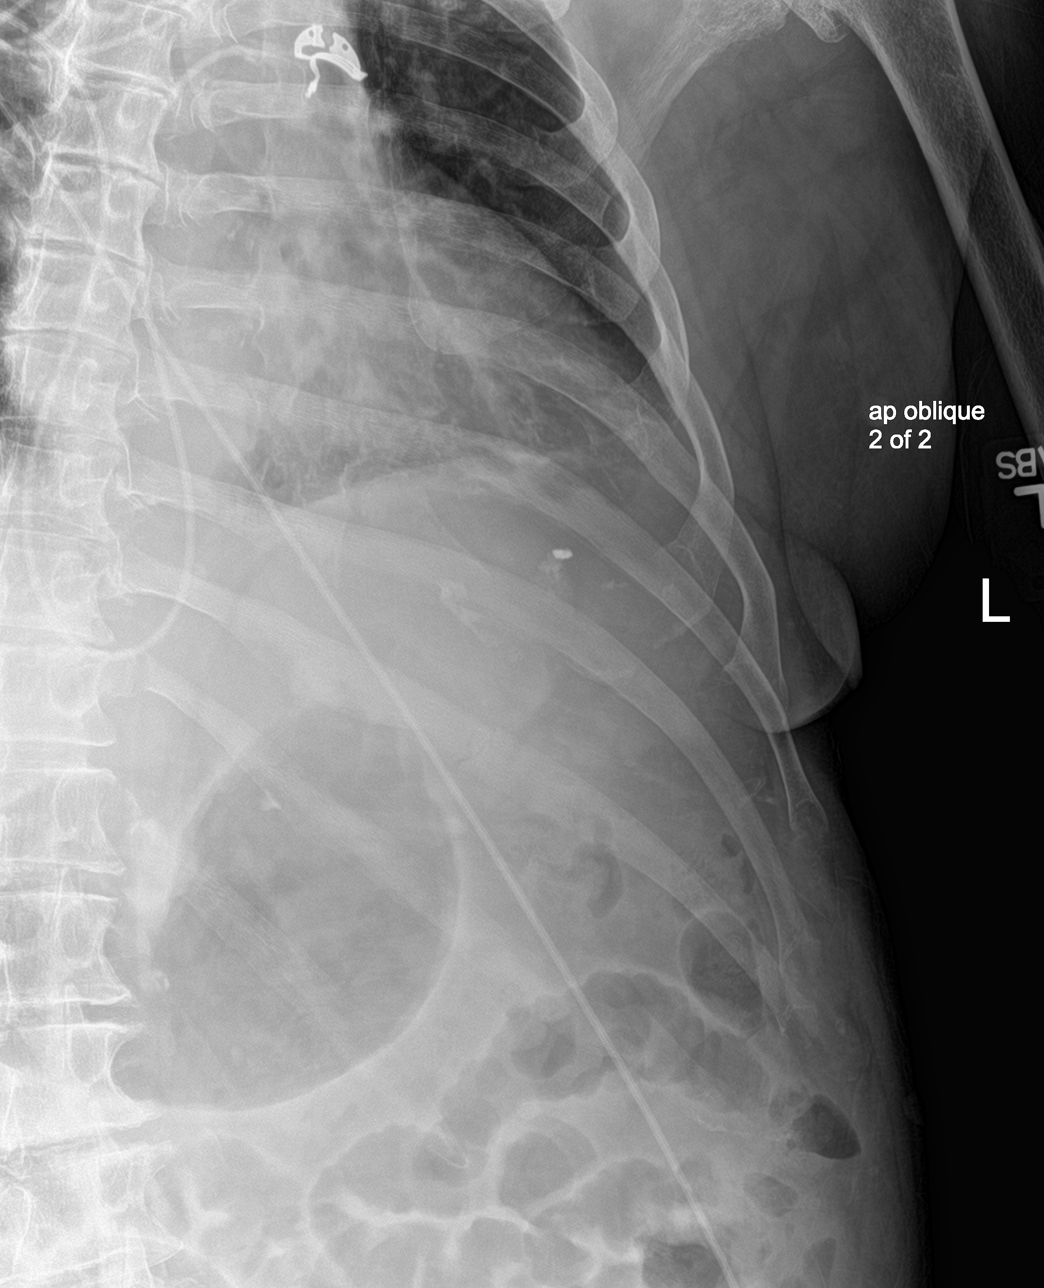

[4 of 4 positions shown; findings below may reference images not displayed]

FINDINGS: Acute, nondisplaced left posterior tenth rib fracture. No other
acute fractures. No bone lesions.
IMPRESSION: Nondisplaced, left posterior tenth rib fracture.

## 2019-09-09 IMAGING — CR DG FOOT COMPLETE 3+V*L*
3 series · 3 of 3 positions shown · non-contrast
Comparison: [DATE]

CLINICAL DATA: Frequent falls at home. History of Parkinson's
disease and seizures. Has a right ankle fracture. Bruising around
the second toe on the left.

EXAM:
LEFT FOOT - COMPLETE 3+ VIEW

[foot ap]
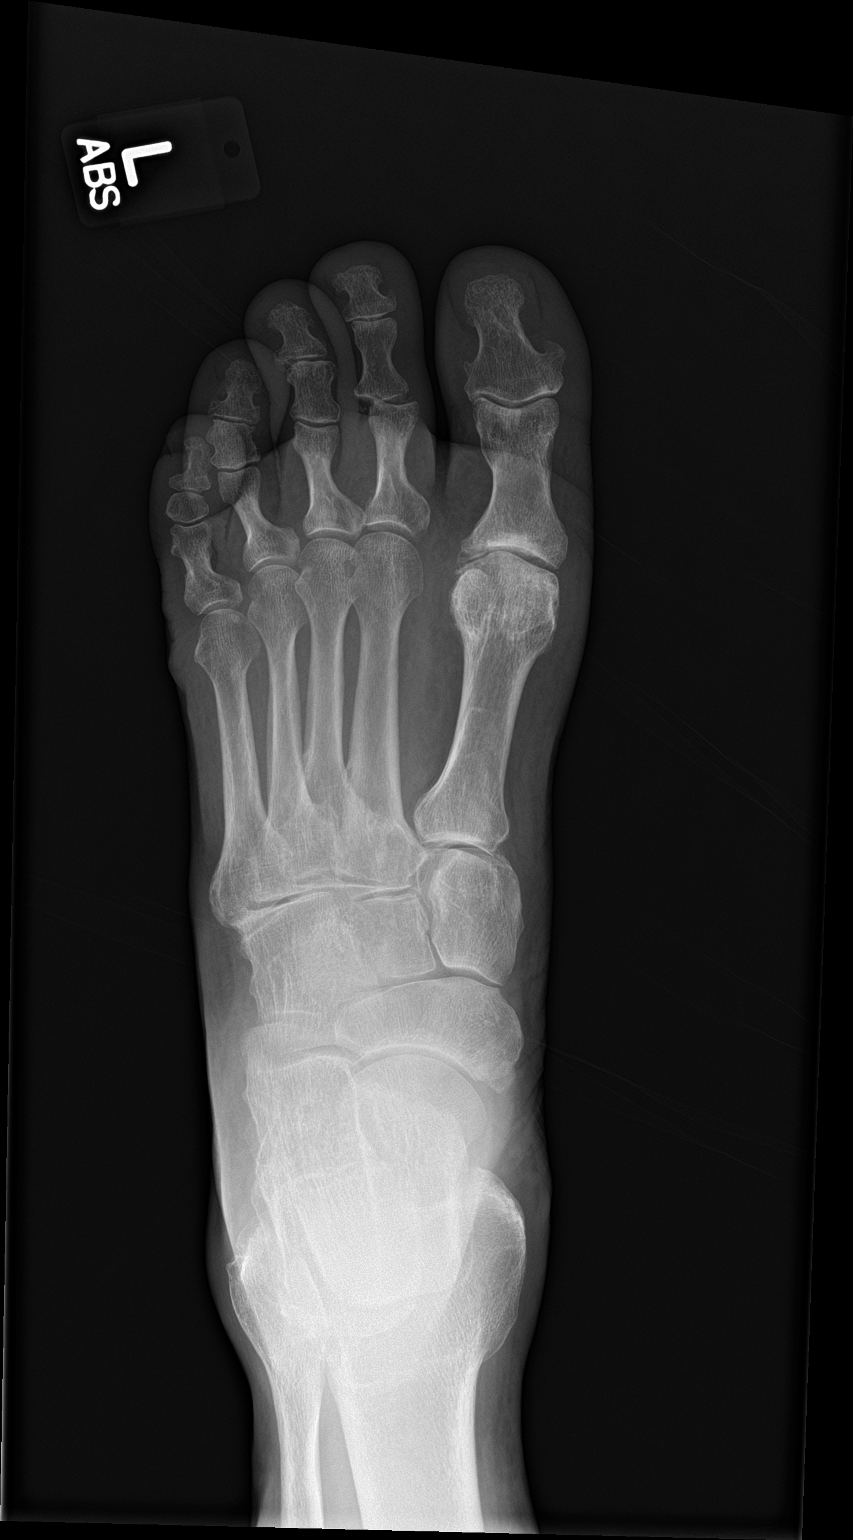

[foot obl]
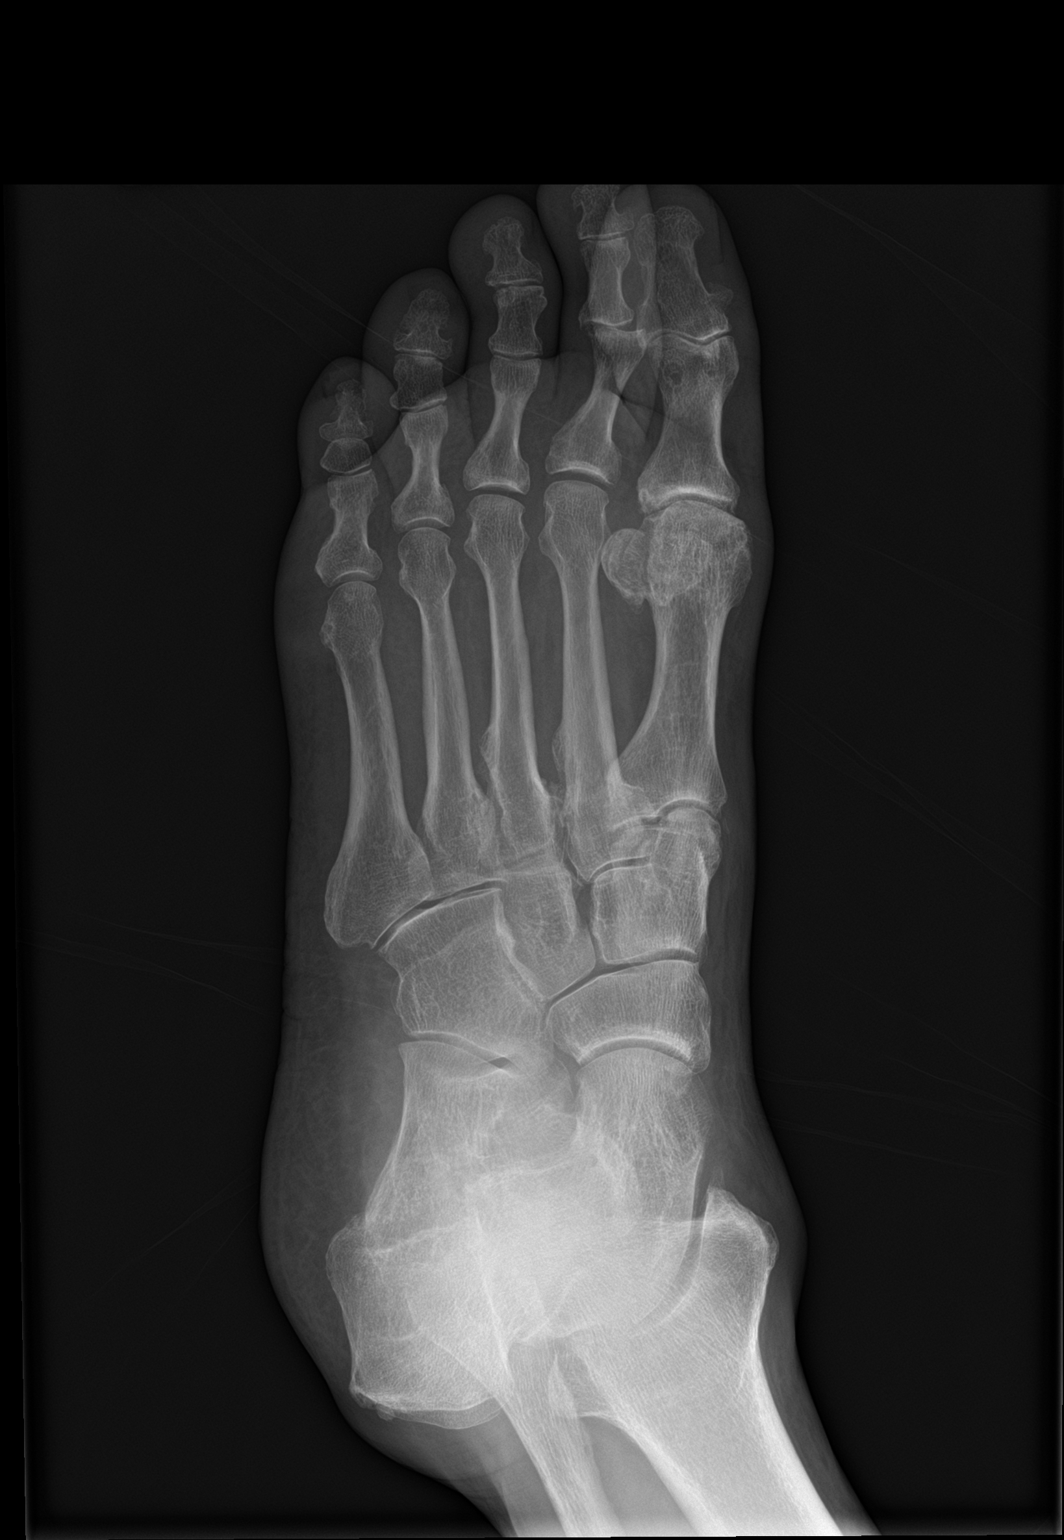

[foot lat]
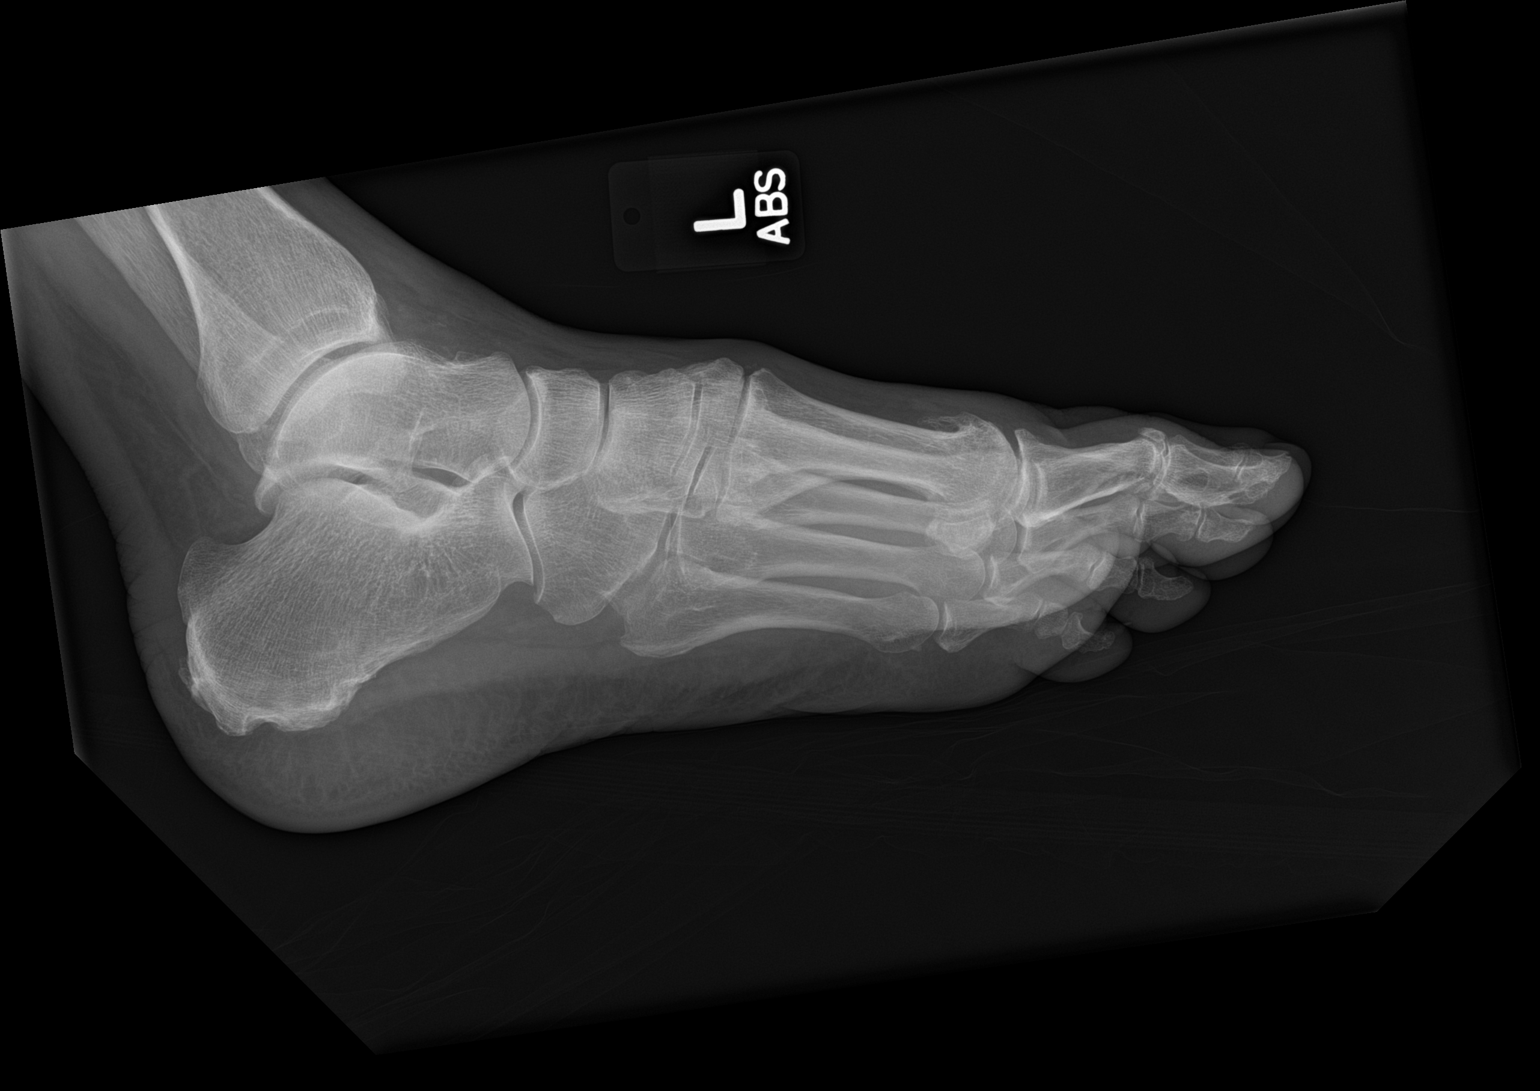

[3 of 3 positions shown; findings below may reference images not displayed]

FINDINGS: The middle phalanx of the left second toe is subluxed laterally in
relation to the proximal phalanx, but non dislocated. There are
small densities along the lateral margin of the second toe PIP joint
which may reflect small avulsion fracture fragments. There is no
other evidence of a fracture.

Remaining joints are normally aligned. There are degenerative
changes most evident at the first metatarsophalangeal joint with
joint space narrowing and small marginal osteophytes.

Soft tissues are unremarkable.
IMPRESSION: 1. Laterally subluxed PIP joint of the second toe with evidence
suggesting small avulsion fractures, likely from the lateral distal
corner of the proximal phalanx of the second toe.
2. No other evidence of a fracture or acute abnormality.
3. Moderate first metatarsophalangeal joint osteoarthritis.

## 2019-09-09 MED ORDER — LEVETIRACETAM IN NACL 1500 MG/100ML IV SOLN
1500.0000 mg | Freq: Two times a day (BID) | INTRAVENOUS | Status: DC
  Administered 2019-09-09 – 2019-09-14 (×10): 1500 mg via INTRAVENOUS
  Filled 2019-09-09 (×17): qty 100

## 2019-09-09 NOTE — Consult Note (Signed)
ORTHOPAEDIC CONSULTATION  REQUESTING PHYSICIAN: Alba Cory, MD  Chief Complaint: right ankle fracture  HPI: Ian Gill is a 74 y.o. male who complains of recent fall with a history of a right ankle fracture.  He has been treated conservatively in the outpatient clinic for this ankle fracture.  He has been worked up for seizure disorder at this time.  Does have a history of seizures.  He was seen today with his son who provides most of the review of systems at this time.  Patient's in a boot.  Patient son relates the patient has been weightbearing to this area as well.  Past Medical History:  Diagnosis Date  . Anxiety   . COPD (chronic obstructive pulmonary disease) (HCC)   . Depression   . Parkinson's disease (HCC)   . Seizures (HCC)   . Stroke [facility])    Past Surgical History:  Procedure Laterality Date  . Shoulder surgery    . SPINAL FUSION     cervical  . TOTAL KNEE ARTHROPLASTY     Social History   Socioeconomic History  . Marital status: Divorced    Spouse name: Not on file  . Number of children: Not on file  . Years of education: Not on file  . Highest education level: Not on file  Occupational History  . Not on file  Tobacco Use  . Smoking status: Former Games developer  . Smokeless tobacco: Never Used  Vaping Use  . Vaping Use: Never used  Substance and Sexual Activity  . Alcohol use: Yes    Alcohol/week: 3.0 standard drinks    Types: 2 Shots of liquor, 1 Cans of beer per week  . Drug use: No  . Sexual activity: Not on file  Other Topics Concern  . Not on file  Social History Narrative  . Not on file   Social Determinants of Health   Financial Resource Strain:   . Difficulty of Paying Living Expenses:   Food Insecurity:   . Worried About Programme researcher, broadcasting/film/video in the Last Year:   . Barista in the Last Year:   Transportation Needs:   . Freight forwarder (Medical):   Marland Kitchen Lack of Transportation (Non-Medical):   Physical Activity:   .  Days of Exercise per Week:   . Minutes of Exercise per Session:   Stress:   . Feeling of Stress :   Social Connections:   . Frequency of Communication with Friends and Family:   . Frequency of Social Gatherings with Friends and Family:   . Attends Religious Services:   . Active Member of Clubs or Organizations:   . Attends Banker Meetings:   Marland Kitchen Marital Status:    Family History  Problem Relation Age of Onset  . COPD Father    Allergies  Allergen Reactions  . Bupropion Other (See Comments)    Seizures  Seizure seizures Seizure   . Dilantin [Phenytoin Sodium Extended]     Past issues with toxicity per pt's son  . Iodinated Diagnostic Agents Swelling   Prior to Admission medications   Medication Sig Start Date End Date Taking? Authorizing Provider  amLODipine (NORVASC) 10 MG tablet Take 1 tablet (10 mg total) by mouth daily. 06/05/19 09/08/19 Yes Sreenath, Sudheer B, MD  atorvastatin (LIPITOR) 20 MG tablet Take 1 tablet (20 mg total) by mouth daily. 07/11/15  Yes Sharman Cheek, MD  Cholecalciferol 125 MCG (5000 UT) capsule Take 5,000 Units by mouth daily.  Yes [provider]  clopidogrel (PLAVIX) 75 MG tablet Take 1 tablet (75 mg total) by mouth daily. 07/11/15  Yes Sharman Cheek, MD  divalproex (DEPAKOTE) 250 MG DR tablet Take 3 tablets (750 mg total) by mouth every morning. 06/05/19 09/08/19 Yes Sreenath, Sudheer B, MD  divalproex (DEPAKOTE) 500 MG DR tablet Take 2 tablets (1,000 mg total) by mouth at bedtime. 06/04/19 09/08/19 Yes Sreenath, Sudheer B, MD  lamoTRIgine (LAMICTAL) 150 MG tablet Take 1 tablet (150 mg total) by mouth 2 (two) times daily. 06/04/19 09/08/19 Yes Sreenath, Sudheer B, MD  levETIRAcetam (KEPPRA) 500 MG tablet Take 3 tablets (1,500 mg total) by mouth 2 (two) times daily. 06/04/19 09/08/19 Yes Sreenath, Sudheer B, MD  lisinopril (ZESTRIL) 20 MG tablet Take 1 tablet (20 mg total) by mouth daily. 06/05/19 09/08/19 Yes Sreenath, Sudheer B, MD   Multiple Vitamin (MULTI-VITAMIN) tablet Take 1 tablet by mouth daily.   Yes [provider]  Omega-3 Fatty Acids (RA FISH OIL) 1000 MG CAPS Take 1 capsule by mouth daily.   Yes [provider]  propranolol (INDERAL) 20 MG tablet Take 20 mg by mouth 2 (two) times daily.   Yes [provider]  sertraline (ZOLOFT) 100 MG tablet Take 200 mg by mouth daily. 10/28/18  Yes [provider]  Melatonin 10 MG TABS Take 1 tablet by mouth at bedtime. Patient not taking: Reported on 05/30/2019 07/11/15   Sharman Cheek, MD   CT Head Wo Contrast  Result Date: 09/08/2019 CLINICAL DATA:  Altered mental status.  Decreased responsiveness. EXAM: CT HEAD WITHOUT CONTRAST TECHNIQUE: Contiguous axial images were obtained from the base of the skull through the vertex without intravenous contrast. COMPARISON:  CT head without contrast 05/30/2019 FINDINGS: Brain: Mild atrophy and white matter changes are stable, likely within normal limits for age. No acute infarct, hemorrhage, or mass lesion is present. The ventricles are of normal size. No significant extraaxial fluid collection is present. The brainstem and cerebellum are within normal limits. Vascular: Atherosclerotic calcifications are present within the cavernous internal carotid arteries bilaterally. Skull: Calvarium is intact. No focal lytic or blastic lesions are present. Asymmetric right parietal scalp soft tissue swelling is present without underlying fracture or foreign body. Sinuses/Orbits: The paranasal sinuses and mastoid air cells are clear. The globes and orbits are within normal limits. IMPRESSION: 1. Asymmetric right parietal scalp soft tissue swelling without underlying fracture or foreign body. Question trauma. 2. Stable atrophy and white matter disease, likely within normal limits for age. 3. Atherosclerosis. Electronically Signed   By: Marin Roberts M.D.   On: 09/08/2019 05:38   DG Chest Portable 1 View  Result  Date: 09/08/2019 CLINICAL DATA:  Altered mental status. EXAM: PORTABLE CHEST 1 VIEW COMPARISON:  09/06/2019 FINDINGS: The cardiac silhouette, mediastinal and hilar contours are within normal limits and stable. Low lung volumes with vascular crowding and streaky atelectasis but no infiltrates or effusions. The bony thorax is intact. IMPRESSION: Low lung volumes with vascular crowding and streaky atelectasis. Electronically Signed   By: Rudie Meyer M.D.   On: 09/08/2019 06:19    Positive ROS: All other systems have been reviewed and were otherwise negative with the exception of those mentioned in the HPI and as above.  12 point ROS was performed.  Physical Exam: General: Alert and oriented.  No apparent distress.  Vascular:  Left foot:Dorsalis Pedis:  present Posterior Tibial:  present  Right foot: Dorsalis Pedis:  present Posterior Tibial:  present  Neuro:intact sensation  Derm: On the left foot he has a small ecchymotic area on his second toe.  No fracture blisters or skin lesions on his right foot or ankle.  Ortho/MS: He does have edema to the right ankle and foot at this time.  He is wearing a compression stocking I was able to evaluate the ankle today.  Pain to his left second toe.  I personally reviewed the x-rays today and compared these to outpatient x-rays.  It shows increased gapping of the medial gutter with further lateral displacement of the distal fibula. Assessment: Bimalleolar equivalent right ankle fracture Seizure disorder Parkinson's disease History of stroke  Plan: I had a discussion with the son today in regards to the findings of the x-rays.  I discussed the fact that there is further displacement of the ankle fracture which makes this an unstable ankle fracture and likely has not healed very well at the fracture site.  My concern is further displacement will likely occur with increased weightbearing.  We will make sure that medically he is stable but I have  recommended surgical intervention with ORIF of this ankle fracture.  I discussed with the son who is healthcare power of attorney for his father.  He has given verbal consent.  We will likely plan to perform this on Sunday morning.  We will give the next 24 hours for further work-up and medical stabilization.    Irean Hong, DPM Cell [phone]   09/09/2019 10:56 AM

## 2019-09-09 NOTE — NC FL2 (Signed)
[place] MEDICAID FL2 LEVEL OF CARE SCREENING TOOL     IDENTIFICATION  Patient Name: Ian Gill Birthdate: July 12, 1945 Sex: male Admission Date (Current Location): 09/08/2019  [place] and [place] Number:  Chiropodist and Address:  [facility], [address], [place], [place] [address]      Provider Number: [id number]  Attending Physician Name and Address:  Alba Cory, MD  Relative Name and Phone Number:  Colbert Curenton (son) [phone]    Current Level of Care: Hospital Recommended Level of Care: Skilled Nursing Facility Prior Approval Number:    Date Approved/Denied:   PASRR Number: [id number]  Exp 09/18/19  Discharge Plan: SNF    Current Diagnoses: Patient Active Problem List   Diagnosis Date Noted  . Hypertensive urgency 09/08/2019  . Fall 09/08/2019  . COPD (chronic obstructive pulmonary disease) (HCC)   . Depression   . Stroke (HCC)   . HLD (hyperlipidemia)   . GERD (gastroesophageal reflux disease)   . HTN (hypertension)   . Acute metabolic encephalopathy   . Postictal state (HCC) 05/31/2019  . Seizure (HCC) 05/30/2019  . Alcohol use 05/30/2019  . Post-ictal state (HCC) 05/30/2019    Orientation RESPIRATION BLADDER Height & Weight     Self, Situation, Place  Normal External catheter Weight: 104.3 kg Height:  5\' 11"  (180.3 cm)  BEHAVIORAL SYMPTOMS/MOOD NEUROLOGICAL BOWEL NUTRITION STATUS    Convulsions/Seizures Continent Diet (see discharge summary)  AMBULATORY STATUS COMMUNICATION OF NEEDS Skin   Limited Assist Verbally Skin abrasions, Bruising (right knee abrasion, bruising to the right arm and left arm and both legs- ortho boot on right ankle)                       Personal Care Assistance Level of Assistance  Bathing, Feeding, Dressing Bathing Assistance: Limited assistance Feeding assistance: Limited assistance Dressing Assistance: Limited assistance     Functional Limitations Info   Hearing   Hearing Info: Impaired      SPECIAL CARE FACTORS FREQUENCY  PT (By licensed PT), OT (By licensed OT)     PT Frequency: 5 times per week OT Frequency: 5 times per week            Contractures Contractures Info: Not present    Additional Factors Info  Code Status, Allergies Code Status Info: DNR Allergies Info: Bupropion, dilantin, contrast dye iodine           Current Medications (09/09/2019):  This is the current hospital active medication list Current Facility-Administered Medications  Medication Dose Route Frequency Provider Last Rate Last Admin  . acetaminophen (TYLENOL) tablet 650 mg  650 mg Oral Q6H PRN 09/11/2019, MD       Or  . acetaminophen (TYLENOL) suppository 650 mg  650 mg Rectal Q6H PRN Lorretta Harp, MD      . amLODipine (NORVASC) tablet 10 mg  10 mg Oral Daily Lorretta Harp, MD   10 mg at 09/09/19 1034  . atorvastatin (LIPITOR) tablet 20 mg  20 mg Oral Daily 09/11/19, MD   20 mg at 09/09/19 1035  . cholecalciferol (VITAMIN D3) tablet 5,000 Units  5,000 Units Oral Daily 09/11/19, MD      . clopidogrel (PLAVIX) tablet 75 mg  75 mg Oral Daily Lorretta Harp, MD   75 mg at 09/09/19 1034  . divalproex (DEPAKOTE) DR tablet 1,000 mg  1,000 mg Oral QHS 09/11/19, MD   1,000 mg at 09/08/19 2212  .  divalproex (DEPAKOTE) DR tablet 750 mg  750 mg Oral q morning - 10a Lorretta Harp, MD   750 mg at 09/09/19 1035  . enoxaparin (LOVENOX) injection 40 mg  40 mg Subcutaneous Q24H Lorretta Harp, MD   40 mg at 09/08/19 2212  . folic acid (FOLVITE) tablet 1 mg  1 mg Oral Daily Lorretta Harp, MD   1 mg at 09/09/19 1034  . hydrALAZINE (APRESOLINE) injection 5 mg  5 mg Intravenous Q2H PRN Lorretta Harp, MD      . lamoTRIgine (LAMICTAL) tablet 150 mg  150 mg Oral BID Lorretta Harp, MD   150 mg at 09/09/19 1034  . levETIRAcetam (KEPPRA) IVPB 1500 mg/ 100 mL premix  1,500 mg Intravenous Q12H Regalado, Belkys A, MD 400 mL/hr at 09/09/19 1034 1,500 mg at 09/09/19 1034  . lisinopril (ZESTRIL)  tablet 20 mg  20 mg Oral Daily Lorretta Harp, MD   20 mg at 09/09/19 1035  . multivitamin with minerals tablet 1 tablet  1 tablet Oral Daily Lorretta Harp, MD   1 tablet at 09/09/19 1035  . omega-3 acid ethyl esters (LOVAZA) capsule 1,000 mg  1,000 mg Oral Daily Lorretta Harp, MD   1,000 mg at 09/09/19 1034  . ondansetron (ZOFRAN) injection 4 mg  4 mg Intravenous Q8H PRN Lorretta Harp, MD      . propranolol (INDERAL) tablet 20 mg  20 mg Oral BID Lorretta Harp, MD   20 mg at 09/09/19 1036  . sertraline (ZOLOFT) tablet 200 mg  200 mg Oral Daily Lorretta Harp, MD   200 mg at 09/09/19 1035  . thiamine tablet 100 mg  100 mg Oral Daily Lorretta Harp, MD   100 mg at 09/09/19 1035   Or  . thiamine (B-1) injection 100 mg  100 mg Intravenous Daily Lorretta Harp, MD         Discharge Medications: Please see discharge summary for a list of discharge medications.  Relevant Imaging Results:  Relevant Lab Results:   Additional Information SS# [id number]  Allayne Butcher, RN

## 2019-09-09 NOTE — Evaluation (Signed)
Physical Therapy Evaluation Patient Details Name: Ian Gill MRN: [id number] DOB: November 09, 1945 Today's Date: 09/09/2019   History of Present Illness  Ian Gill is a 74 y.o. male with medical history significant of seizure, hypertension, hyperlipidemia, COPD, stroke, GERD, depression, anxiety, Parkinson's disease, alcohol abuse, who presents with altered mental status and seizure.    Clinical Impression  Pt alert, agreeable to PT/OT treatment with encouragement. Denied pain but with mobility did endorse L flank/rib pain. The patient reported he lives at [facility], facility assists with cleaning, and he has been using a WC for the last 3 weeks for mobility.  The patient demonstrated supine to sit with modAx2, and assist with management of RLE. Returned to supine at end of session for MRI transport modA. Sit <> stand with RW and maxAx2, extensive education about NWB precautions prior to and during mobility with good adherence. Pt performed stand pivot with hopping to recliner and back after seated rest break at RN request to allow pt to transport to imaging. Pt needed tactile and verbal cues for technique as well as assistance with RW management.  Overall the patient demonstrated deficits (see "PT Problem List") that impede the patient's functional abilities, safety, and mobility and would benefit from skilled PT intervention. Recommendation is SNF due to current level of assistance needed for mobility and decreased caregiver support.     Follow Up Recommendations SNF    Equipment Recommendations  None recommended by PT;Other (comment) (pt has cane, RW, rollator and WC at home)    Recommendations for Other Services       Precautions / Restrictions Precautions Precautions: Fall;Other (comment) Precaution Comments: seizure Required Braces or Orthoses: Other Brace Other Brace: ortho boot Restrictions Weight Bearing Restrictions: Yes RLE Weight Bearing: Non weight bearing       Mobility  Bed Mobility Overal bed mobility: Needs Assistance Bed Mobility: Supine to Sit;Sit to Supine     Supine to sit: Mod assist;+2 for physical assistance;HOB elevated Sit to supine: Min assist   General bed mobility comments: Assist for RLE mgmt and trunk elevation   Transfers Overall transfer level: Needs assistance Equipment used: Rolling walker (2 wheeled) Transfers: Sit to/from [employer] Sit to Stand: Max assist;+2 physical assistance;From elevated surface Stand pivot transfers: Max assist;+2 physical assistance       General transfer comment: VCs to maintain NWBing and assist for RW mgmt bed<>chair SPT  Ambulation/Gait             General Gait Details: deferred  Stairs            Wheelchair Mobility    Modified Rankin (Stroke Patients Only)       Balance Overall balance assessment: Needs assistance Sitting-balance support: No upper extremity supported;Feet supported Sitting balance-Leahy Scale: Fair     Standing balance support: Bilateral upper extremity supported;During functional activity Standing balance-Leahy Scale: Poor                               Pertinent Vitals/Pain Pain Assessment: Faces Pain Score: 3  Faces Pain Scale: Hurts little more Pain Location: posterior rib /upper abdomen  Pain Descriptors / Indicators: Aching;Discomfort;Dull Pain Intervention(s): Limited activity within patient's tolerance;Monitored during session;Repositioned    Home Living Family/patient expects to be discharged to:: Assisted living               Home Equipment: Walker - 4 wheels;Wheelchair - manual;Shower seat Additional Comments:  Pt is from [facility]    Prior Function Level of Independence: Needs assistance   Gait / Transfers Assistance Needed: Pt reports using w/c last 3 weeks r/t ankle fx, endorses hx of falls including the fall leading to fx.   ADL's / Homemaking Assistance  Needed: Assist from ILF for cleaning and dining room for meals  Comments: Reports multiple seizures      Hand Dominance   Dominant Hand: Left    Extremity/Trunk Assessment   Upper Extremity Assessment Upper Extremity Assessment: Overall WFL for tasks assessed    Lower Extremity Assessment Lower Extremity Assessment: RLE deficits/detail;LLE deficits/detail RLE: Unable to fully assess due to immobilization LLE Deficits / Details: able to lift against gravity       Communication   Communication: No difficulties  Cognition Arousal/Alertness: Awake/alert Behavior During Therapy: WFL for tasks assessed/performed Overall Cognitive Status: No family/caregiver present to determine baseline cognitive functioning                                 General Comments: A&O x4, required increased time and repetition to follow simple commands      General Comments General comments (skin integrity, edema, etc.): bruising noted to L flank    Exercises Other Exercises Other Exercises: Pt educated on NWB status beginning and end of session, pt unable to recall at end of session without re-education Other Exercises: PT returned to room to assist RN with transferring pt back to bed for MRI   Assessment/Plan    PT Assessment Patient needs continued PT services  PT Problem List Decreased strength;Decreased range of motion;Decreased knowledge of use of DME;Decreased activity tolerance;Decreased safety awareness;Decreased balance;Decreased knowledge of precautions;Pain;Decreased mobility       PT Treatment Interventions DME instruction;Balance training;Gait training;Neuromuscular re-education;Functional mobility training;Patient/family education;Therapeutic activities;Wheelchair mobility training;Therapeutic exercise    PT Goals (Current goals can be found in the Care Plan section)  Acute Rehab PT Goals Patient Stated Goal: to return home PT Goal Formulation: With patient Time  For Goal Achievement: 09/23/19 Potential to Achieve Goals: Fair    Frequency 7X/week   Barriers to discharge Decreased caregiver support      Co-evaluation   Reason for Co-Treatment: For patient/therapist safety;To address functional/ADL transfers PT goals addressed during session: Mobility/safety with mobility;Balance;Proper use of DME OT goals addressed during session: ADL's and self-care;Proper use of Adaptive equipment and DME       AM-PAC PT "6 Clicks" Mobility  Outcome Measure Help needed turning from your back to your side while in a flat bed without using bedrails?: A Lot Help needed moving from lying on your back to sitting on the side of a flat bed without using bedrails?: A Lot Help needed moving to and from a bed to a chair (including a wheelchair)?: Total Help needed standing up from a chair using your arms (e.g., wheelchair or bedside chair)?: Total Help needed to walk in hospital room?: Total Help needed climbing 3-5 steps with a railing? : Total 6 Click Score: 8    End of Session Equipment Utilized During Treatment: Gait belt Activity Tolerance: Patient tolerated treatment well Patient left: in bed;with call bell/phone within reach;with nursing/sitter in room Nurse Communication: Mobility status PT Visit Diagnosis: Difficulty in walking, not elsewhere classified (R26.2);Other abnormalities of gait and mobility (R26.89);History of falling (Z91.81);Pain Pain - Right/Left: Left (and Right) Pain - part of body: Ankle and joints of foot (L flank  and R foot)    Time: 3606-7703 PT Time Calculation (min) (ACUTE ONLY): 41 min   Charges:   PT Evaluation $PT Eval Low Complexity: 1 Low PT Treatments $Therapeutic Exercise: 8-22 mins       Olga Coaster PT, DPT 2:53 PM,09/09/19

## 2019-09-09 NOTE — Evaluation (Signed)
Clinical/Bedside Swallow Evaluation Patient Details  Name: Ian Gill MRN: [id number] Date of Birth: 10-19-1945  Today's Date: 09/09/2019 Time: SLP Start Time (ACUTE ONLY): 1100 SLP Stop Time (ACUTE ONLY): 1130 SLP Time Calculation (min) (ACUTE ONLY): 30 min  Past Medical History:  Past Medical History:  Diagnosis Date  . Anxiety   . COPD (chronic obstructive pulmonary disease) (HCC)   . Depression   . Parkinson's disease (HCC)   . Seizures (HCC)   . Stroke [facility])    Past Surgical History:  Past Surgical History:  Procedure Laterality Date  . Shoulder surgery    . SPINAL FUSION     cervical  . TOTAL KNEE ARTHROPLASTY     HPI:  Ian Gill is a 74 y.o. male with medical history significant of seizure, hypertension, hyperlipidemia, COPD, stroke, GERD, depression, anxiety, Parkinson's disease, alcohol abuse, who presents with altered mental status and seizure. Patient recently had R ankle fracture and sent back to ILF from rehab facility. Patient seen by ST in 05/2019 to address swallowing function - evaluating SLP recommended "dysphagia 3 diet (d/t incessant talking) with tin liquids via single cup sips". CXR on 09/06/19: "Mild left base atelectasis. Lungs elsewhere clear. Cardiac silhouette within normal limits."  CT on 09/08/19 with following impression: "1. Asymmetric right parietal scalp soft tissue swelling without underlying fracture or foreign body. Question trauma. 2. Stable atrophy and white matter disease, likely within normal limits for age. 3. Atherosclerosis."  MD placed ST order to evaluate any changes in patient's oropharyngeal swallowing function during this hospital stay.   Assessment / Plan / Recommendation Clinical Impression  SLP completed a cranial nerve examination in relation to the bedside swallowing examination with the following observations: CN V Trigeminal (motor) = WNL CN V Trigeminal (sensory) = WNL CN VII Facial (motor) = generalized weakness, but  overall functional CN VII Facial (sensory) = DNT  CN IX Glossopharyngeal (sensory) =DNT CN IX Glossopharyngeal (motor) = WNL CN X Vagus (motor) = WNL CN XI Spinal Accessory (motor) = decreased neck ROM CN XII Hypoglossal (motor) = WNL  Patient presents with no oral dysphagia and no s/s of pharyngeal dysphagia. Patient with noted poor/missing dentition, however did not affect bolus containment in oral cavity and/or mastication. D/t medical history and suspected cognitive/behavioral deficits (I.e. noted confusion and tangential speech), patients demonstrates mild risk for aspiration with possible risk of malnutrition/dehyration. However, no s/s of aspiration noted during the bedside swallowing examination today. In turn, recommended diet includes Regular and Thin liquid diet - May require feeding assistance. Patient denied current or previous history of swallowing issues, however report recent coughing episode when trying to take multiple whole pills at one time. SLP recommend patient to consider taking 1-2 whole pills at a time and/or taking whole pills in puree consistency to decrease risk of aspiration/choking with pills. Pt agreed to consider it. Education provided regarding diet recommendation and safe swallowing strategies to implement during meals: sit upright, slow rate, small bites/sips, consistent oral care, feeding assistance if needed. Pt verbalized understanding and agreement to the above recommendations. D/t noted confusion/tangential speech during BSE today, MD to consider placing ST order for cognitive-communication evaluation if appropriate for current hospital stay.     Aspiration Risk  Mild aspiration risk    Diet Recommendation Thin liquid;Regular   Medication Administration: Other (Comment) (Per patient preference) Supervision: Intermittent supervision to cue for compensatory strategies Compensations: Minimize environmental distractions;Slow rate;Small sips/bites Postural  Changes: Seated upright at 90 degrees  Other  Recommendations Oral Care Recommendations: Oral care BID   Follow up Recommendations Other (comment) (TBD)      Frequency and Duration  Skilled ST services not indicated for swallowing therapy.         Prognosis Prognosis for Safe Diet Advancement: Fair Barriers to Reach Goals: Cognitive deficits;Behavior      Swallow Study   General Date of Onset: 09/06/19 HPI: Ian Gill is a 74 y.o. male with medical history significant of seizure, hypertension, hyperlipidemia, COPD, stroke, GERD, depression, anxiety, Parkinson's disease, alcohol abuse, who presents with altered mental status and seizure. Patient recently had R ankle fracture and sent back to ILF from rehab facility. Patient seen by ST in 05/2019 to address swallowing function - evaluating SLP recommended "dysphagia 3 diet (d/t incessant talking) with tin liquids via single cup sips". CXR on 09/06/19: "Mild left base atelectasis. Lungs elsewhere clear. Cardiac silhouette within normal limits."  CT on 09/08/19 with following impression: "1. Asymmetric right parietal scalp soft tissue swelling without underlying fracture or foreign body. Question trauma. 2. Stable atrophy and white matter disease, likely within normal limits for age. 3. Atherosclerosis."  MD placed ST order to evaluate any changes in patient's oropharyngeal swallowing function during this hospital stay. Type of Study: Bedside Swallow Evaluation Previous Swallow Assessment: Patient with BSE in 05/2019 during previous hospital stay - recommended dysphagia 3 diet. Diet Prior to this Study: Regular;Thin liquids Temperature Spikes Noted: N/A Respiratory Status: Room air History of Recent Intubation: No Behavior/Cognition: Cooperative;Pleasant mood;Distractible Oral Cavity Assessment: Within Functional Limits Oral Cavity - Dentition: Poor condition;Missing dentition Self-Feeding Abilities: Total assist Patient Positioning:  Upright in bed Baseline Vocal Quality: Normal Volitional Cough: Strong Volitional Swallow:  (laryngeal movement present)    Oral/Motor/Sensory Function Overall Oral Motor/Sensory Function: Within functional limits   Ice Chips Ice chips: Within functional limits Presentation: Spoon   Thin Liquid Thin Liquid: Within functional limits Presentation: Spoon;Straw    Nectar Thick Nectar Thick Liquid: Not tested   Honey Thick Honey Thick Liquid: Not tested   Puree Puree: Within functional limits   Solid    Everlean Patterson, M.S. CCC-SLP Speech-Language Pathologist  Solid: Within functional limits      Everlean Patterson 09/09/2019,11:37 AM

## 2019-09-09 NOTE — TOC Initial Note (Signed)
Transition of Care [facility]) - Initial/Assessment Note    Patient Details  Name: Ian Gill MRN: [id number] Date of Birth: [birth year]  Transition of Care [facility]) CM/SW Contact:    Allayne Butcher, RN Phone Number: 09/09/2019, 11:13 AM  Clinical Narrative:                 Patient admitted to the hospital with possible seizure and acute encephalopathy.  Patient also has a right ankle fracture that was already present with ortho boot on.  Patient lives at [facility].  Plan for discharge will be for patient to go to the skilled nursing at [facility] [place] on Monday.  Patient has been having frequent falls at home he has a history of seizures and Parkinson's disease.   PT and OT have been consulted.  RNCM will cont to follow through discharge.   Expected Discharge Plan: Skilled Nursing Facility Barriers to Discharge: Continued Medical Work up   Patient Goals and CMS Choice   CMS Medicare.gov Compare Post Acute Care list provided to:: Patient Choice offered to / list presented to : Patient  Expected Discharge Plan and Services Expected Discharge Plan: Skilled Nursing Facility   Discharge Planning Services: CM Consult Post Acute Care Choice: Skilled Nursing Facility Living arrangements for the past 2 months: Independent Living Facility                                      Prior Living Arrangements/Services Living arrangements for the past 2 months: Independent Living Facility Lives with:: Self Patient language and need for interpreter reviewed:: Yes Do you feel safe going back to the place where you live?: Yes      Need for Family Participation in Patient Care: Yes (Comment) (frequent falls) Care giver support system in place?: Yes (comment) (son)   Criminal Activity/Legal Involvement Pertinent to Current Situation/Hospitalization: No - Comment as needed  Activities of Daily Living Home Assistive Devices/Equipment: None ADL Screening  (condition at time of admission) Patient's cognitive ability adequate to safely complete daily activities?: No Is the patient deaf or have difficulty hearing?: Yes Does the patient have difficulty seeing, even when wearing glasses/contacts?: No Does the patient have difficulty concentrating, remembering, or making decisions?: No Patient able to express need for assistance with ADLs?: Yes Does the patient have difficulty dressing or bathing?: Yes Independently performs ADLs?: Yes (appropriate for developmental age) Does the patient have difficulty walking or climbing stairs?: Yes Weakness of Legs: Both Weakness of Arms/Hands: None  Permission Sought/Granted Permission sought to share information with : Case Manager, Magazine features editor, Family Supports Permission granted to share information with : Yes, Verbal Permission Granted  Share Information with NAME: Loraine Leriche  Permission granted to share info w AGENCY: [place] [employer] granted to share info w Relationship: son     Emotional Assessment Appearance:: Appears stated age Attitude/Demeanor/Rapport: Engaged Affect (typically observed): Accepting Orientation: : Oriented to Self, Oriented to Place, Oriented to Situation Alcohol / Substance Use: Not Applicable Psych Involvement: No (comment)  Admission diagnosis:  Seizure (HCC) [R56.9] Encephalopathy acute [G93.40] Patient Active Problem List   Diagnosis Date Noted  . Hypertensive urgency 09/08/2019  . Fall 09/08/2019  . COPD (chronic obstructive pulmonary disease) (HCC)   . Depression   . Stroke (HCC)   . HLD (hyperlipidemia)   . GERD (gastroesophageal reflux disease)   . HTN (hypertension)   .  Acute metabolic encephalopathy   . Postictal state (HCC) 05/31/2019  . Seizure (HCC) 05/30/2019  . Alcohol use 05/30/2019  . Post-ictal state (HCC) 05/30/2019   PCP:  Barbette Reichmann, MD Pharmacy:   [facility] [phone] [place], [place] - [address]  [address] [place] [address] Phone: [phone] Fax: [phone]     Social Determinants of Health (SDOH) Interventions    Readmission Risk Interventions No flowsheet data found.

## 2019-09-09 NOTE — Evaluation (Signed)
Occupational Therapy Evaluation Patient Details Name: Ian Gill MRN: [id number] DOB: Oct 22, 1945 Today's Date: 09/09/2019    History of Present Illness Ian Gill is a 74 y.o. male with medical history significant of seizure, hypertension, hyperlipidemia, COPD, stroke, GERD, depression, anxiety, Parkinson's disease, alcohol abuse, who presents with altered mental status and seizure.   Clinical Impression   [name] was seen for OT/PT co-evaluation this date. Prior to hospital admission, pt was living in ILF at [facility] requiring use of w/c for mobility 2/2 recent ankle fx. Pt presents to acute OT demonstrating impaired ADL performance and functional mobility 2/2 visual deficits, functional strength/balance deficits, decreased safety awareness, and decreased activity tolerance. Pt currently requires TOTAL A don L sock at bed level. MIN A self-feeding at bed level improving to SETUP seated in chair. Pt reports double vision and required tactile input from LUE to scoop food onto spoon. MAX A x2 for ADL t/f - VCs to maintain NWBing and assist for RW mgmt. Pt would benefit from skilled OT to address noted impairments and functional limitations (see below for any additional details) in order to maximize safety and independence while minimizing falls risk and caregiver burden. Upon hospital discharge, recommend STR to maximize pt safety and return to PLOF.     Follow Up Recommendations  SNF    Equipment Recommendations   (TBD)    Recommendations for Other Services       Precautions / Restrictions Precautions Precautions: Fall;Other (comment) (Seizure) Required Braces or Orthoses: Other Brace Other Brace: ortho boot Restrictions Weight Bearing Restrictions: Yes RLE Weight Bearing: Non weight bearing      Mobility Bed Mobility Overal bed mobility: Needs Assistance Bed Mobility: Supine to Sit     Supine to sit: Mod assist;+2 for physical assistance;HOB elevated      General bed mobility comments: Assist for RLE mgmt and trunk elevation   Transfers Overall transfer level: Needs assistance Equipment used: Rolling walker (2 wheeled) Transfers: Sit to/from [employer] Sit to Stand: Max assist;+2 physical assistance;From elevated surface Stand pivot transfers: Max assist;+2 physical assistance       General transfer comment: VCs to maintain NWBing and assist for RW mgmt bed>chair SPT    Balance Overall balance assessment: Needs assistance Sitting-balance support: No upper extremity supported;Feet supported (LLE only supported on floor) Sitting balance-Leahy Scale: Fair     Standing balance support: Bilateral upper extremity supported;During functional activity Standing balance-Leahy Scale: Poor        ADL either performed or assessed with clinical judgement   ADL Overall ADL's : Needs assistance/impaired      General ADL Comments: MIN A self-feeding at bed level improving to SETUP seated in chair. MAX A x2 for ADL t/f. TOTAL A don L sock at bed level      Vision Patient Visual Report: Diplopia Vision Assessment?: Vision impaired- to be further tested in functional context Additional Comments: Difficulty spooning food, required tactile input from LUE to place food on spoon.       Pertinent Vitals/Pain Pain Assessment: Faces Pain Score: 3  Faces Pain Scale: Hurts little more Pain Location: posterior rib /upper abdomen  Pain Descriptors / Indicators: Aching;Discomfort;Dull Pain Intervention(s): Limited activity within patient's tolerance;Monitored during session;Repositioned     Hand Dominance Left   Extremity/Trunk Assessment Upper Extremity Assessment Upper Extremity Assessment: Overall WFL for tasks assessed   Lower Extremity Assessment Lower Extremity Assessment: RLE deficits/detail;Generalized weakness RLE: Unable to fully assess due to immobilization  Communication Communication Communication: No  difficulties   Cognition Arousal/Alertness: Awake/alert Behavior During Therapy: WFL for tasks assessed/performed Overall Cognitive Status: No family/caregiver present to determine baseline cognitive functioning    General Comments: A&O x4, required increased time and repetition to follow simple commands   General Comments  bruising noted to L flank     Exercises Exercises: Other exercises Other Exercises Other Exercises: Pt educated re: OT role, DME recs, d/c recs, functional application of NWBing pcns Other Exercises: LBD, sup>sit, sit<>stand x2, SPT, sitting/standing balance/tolerance     Home Living Family/patient expects to be discharged to:: Assisted living    Home Equipment: Walker - 4 wheels;Wheelchair - Sport and exercise psychologist Comments: Pt is from [facility] ILF      Prior Functioning/Environment Level of Independence: Needs assistance  Gait / Transfers Assistance Needed: Pt reports using w/c last 3 weeks r/t ankle fx, endorses hx of falls including the fall leading to fx.  ADL's / Homemaking Assistance Needed: Assist from ILF for cleaning and dining room for meals   Comments: Reports multiple seizures         OT Problem List: Decreased strength;Decreased range of motion;Decreased activity tolerance;Impaired balance (sitting and/or standing);Impaired vision/perception;Decreased safety awareness      OT Treatment/Interventions: Self-care/ADL training;Therapeutic exercise;Energy conservation;DME and/or AE instruction;Therapeutic activities;Patient/family education;Balance training    OT Goals(Current goals can be found in the care plan section) Acute Rehab OT Goals Patient Stated Goal: to return home OT Goal Formulation: With patient Time For Goal Achievement: 09/23/19 Potential to Achieve Goals: Good ADL Goals Pt Will Perform Grooming: with modified independence;sitting Pt Will Perform Lower Body Dressing: with min assist;with adaptive  equipment;sitting/lateral leans (c no VCs to maintain NWBing pcns) Pt Will Transfer to Toilet: with mod assist;stand pivot transfer;bedside commode (c LRAD PRN)  OT Frequency: Min 1X/week   Barriers to D/C: Decreased caregiver support          Co-evaluation PT/OT/SLP Co-Evaluation/Treatment: Yes Reason for Co-Treatment: For patient/therapist safety;To address functional/ADL transfers PT goals addressed during session: Mobility/safety with mobility;Balance;Proper use of DME OT goals addressed during session: ADL's and self-care;Proper use of Adaptive equipment and DME      AM-PAC OT "6 Clicks" Daily Activity     Outcome Measure Help from another person eating meals?: A Little Help from another person taking care of personal grooming?: A Little Help from another person toileting, which includes using toliet, bedpan, or urinal?: A Lot Help from another person bathing (including washing, rinsing, drying)?: A Lot Help from another person to put on and taking off regular upper body clothing?: A Little Help from another person to put on and taking off regular lower body clothing?: A Lot 6 Click Score: 15   End of Session Equipment Utilized During Treatment: Gait belt;Rolling walker  Activity Tolerance: Patient tolerated treatment well Patient left: in chair;with call bell/phone within reach;with chair alarm set  OT Visit Diagnosis: Unsteadiness on feet (R26.81);Other abnormalities of gait and mobility (R26.89);History of falling (Z91.81)                Time: 1062-6948 OT Time Calculation (min): 24 min Charges:  OT General Charges $OT Visit: 1 Visit OT Evaluation $OT Eval Moderate Complexity: 1 Mod OT Treatments $Self Care/Home Management : 8-22 mins  Kathie Dike, M.S. OTR/L  09/09/19, 2:22 PM  ascom [phone]

## 2019-09-09 NOTE — Progress Notes (Addendum)
PROGRESS NOTE    Ian Gill  HYQ:[id number] DOB: 1945/04/24 DOA: 09/08/2019 PCP: Barbette Reichmann, MD   Brief Narrative: 75 year old with past medical history significant for seizure, hypertension, hyperlipidemia, COPD, stroke, depression with anxiety, alcohol use who presents with altered mental status and seizures. Patient Recently discharged from skilled nursing facility to independent living facility after he had a right ankle fracture.  Day of admission patient was noted to be confused.  His son think patient probably had a seizure episode.  Patient had a fall and has bruises.   Evaluation in the ED patient valproic acid was subtherapeutic at 11.  18, blood pressure elevated 193/79.  Chest x-ray show vascular crowding with low lung volumes.  CT head negative for acute intracranial abnormalities.    Assessment & Plan:   Principal Problem:   Seizure (HCC) Active Problems:   Alcohol use   COPD (chronic obstructive pulmonary disease) (HCC)   Depression   Stroke (HCC)   HLD (hyperlipidemia)   GERD (gastroesophageal reflux disease)   HTN (hypertension)   Acute metabolic encephalopathy   Hypertensive urgency   Fall  1-Seizure and acute metabolic encephalopathy: CT head negative for acute intracranial abnormality. Patient was restarted on IV Keppra, Depakote and Lamictal Patient is alert and oriented x3, per son patient has improved but not back to baseline. Plan to get MRI to rule out a stroke  Alcohol use: Patient has been sober for the last 6 weeks.  COPD: Continue with bronchodilators Depression: Continue with medications History of stroke continue with Plavix and Lipitor GERD: Continue with Pepcid Hypertension: Continue with IV hydralazine as needed Continue with amlodipine lisinopril propanolol  Fall: PT OT eval Check dedicated rib x-ray to rule out rib fractures. Incentive hysterometry  Right ankle fracture: Discussed with podiatry, x-ray looks worse  recommendation is for surgical intervention.  plan to proceed on Sunday Mildly displaced distal fibular fracture. PIP subluxation of the second toe Check ECHO  Estimated body mass index is 32.08 kg/m as calculated from the following:   Height as of this encounter: 5\' 11"  (1.803 m).   Weight as of this encounter: 104.3 kg.   DVT prophylaxis: Lovenox Code Status: DNR Family Communication: Discussed with son who was at bedside Disposition Plan:  Status is: Inpatient  Remains inpatient appropriate because:Hemodynamically unstable   Dispo: The patient is from: Home              Anticipated d/c is to: SNF              Anticipated d/c date is: 3 days              Patient currently is not medically stable to d/c.  Plan to proceed with MRI, patient will require surgery for ankle fracture        Consultants:   Podiatry   Procedures:   none  Antimicrobials:    Subjective: Patient is alert and oriented x3.  He said that he has been taking his medication.  He gets confused with some of the doses of his medication. He is complaining of left side rib pain.  He has a large bruise in that area.  Reports productive cough. Report some left-sided weakness  Objective: Vitals:   09/08/19 1630 09/08/19 1937 09/08/19 2359 09/09/19 0723  BP: (!) 150/86 112/79 (!) 164/71 (!) 136/52  Pulse: 74 (!) 108 88 (!) 57  Resp:  17 16 16   Temp: 98.3 F (36.8 C) 98.3 F (36.8 C) 98.1 F (36.7  C) 97.6 F (36.4 C)  TempSrc: Oral Oral    SpO2: 98% 95% 97% 92%  Weight:      Height:        Intake/Output Summary (Last 24 hours) at 09/09/2019 0723 Last data filed at 09/08/2019 1240 Gross per 24 hour  Intake 1050 ml  Output 250 ml  Net 800 ml   Filed Weights   09/07/19 1438  Weight: 104.3 kg    Examination:  General exam: Appears calm and comfortable  Respiratory system: Clear to auscultation. Respiratory effort normal. Cardiovascular system: S1 & S2 heard, RRR. No JVD, murmurs, rubs,  gallops or clicks. No pedal edema. Gastrointestinal system: Abdomen is nondistended, soft and nontender. No organomegaly or masses felt. Normal bowel sounds heard. Central nervous system: Alert and oriented.  Moves all four extremities Extremities: Symmetric 5 x 5 power. Skin: Multiple skin bruises,  especially left chest   Data Reviewed: I have personally reviewed following labs and imaging studies  CBC: Recent Labs  Lab 09/06/19 0743 09/07/19 1445 09/09/19 0434  WBC 5.4 6.8 5.9  HGB 14.3 14.6 13.0  HCT 40.7 42.0 35.9*  MCV 94.9 94.8 93.5  PLT 173 187 181   Basic Metabolic Panel: Recent Labs  Lab 09/06/19 0743 09/07/19 1445 09/09/19 0434  NA 137 140 136  K 4.2 4.1 3.7  CL 99 101 101  CO2 27 24 26   GLUCOSE 102* 116* 99  BUN 13 12 14   CREATININE 0.85 0.85 0.78  CALCIUM 9.4 9.6 8.7*   GFR: Estimated Creatinine Clearance: 98.1 mL/min (by C-G formula based on SCr of 0.78 mg/dL). Liver Function Tests: Recent Labs  Lab 09/07/19 1445  AST 70*  ALT 21  ALKPHOS 56  BILITOT 1.3*  PROT 7.2  ALBUMIN 4.5   No results for input(s): LIPASE, AMYLASE in the last 168 hours. Recent Labs  Lab 09/08/19 0429  AMMONIA 18   Coagulation Profile: No results for input(s): INR, PROTIME in the last 168 hours. Cardiac Enzymes: No results for input(s): CKTOTAL, CKMB, CKMBINDEX, TROPONINI in the last 168 hours. BNP (last 3 results) No results for input(s): PROBNP in the last 8760 hours. HbA1C: No results for input(s): HGBA1C in the last 72 hours. CBG: No results for input(s): GLUCAP in the last 168 hours. Lipid Profile: No results for input(s): CHOL, HDL, LDLCALC, TRIG, CHOLHDL, LDLDIRECT in the last 72 hours. Thyroid Function Tests: No results for input(s): TSH, T4TOTAL, FREET4, T3FREE, THYROIDAB in the last 72 hours. Anemia Panel: No results for input(s): VITAMINB12, FOLATE, FERRITIN, TIBC, IRON, RETICCTPCT in the last 72 hours. Sepsis Labs: No results for input(s):  PROCALCITON, LATICACIDVEN in the last 168 hours.  Recent Results (from the past 240 hour(s))  SARS Coronavirus 2 by RT PCR (hospital order, performed in [facility] hospital lab) Nasopharyngeal Nasopharyngeal Swab     Status: None   Collection Time: 09/08/19  7:36 AM   Specimen: Nasopharyngeal Swab  Result Value Ref Range Status   SARS Coronavirus 2 NEGATIVE NEGATIVE Final    Comment: (NOTE) SARS-CoV-2 target nucleic acids are NOT DETECTED.  The SARS-CoV-2 RNA is generally detectable in upper and lower respiratory specimens during the acute phase of infection. The lowest concentration of SARS-CoV-2 viral copies this assay can detect is 250 copies / mL. A negative result does not preclude SARS-CoV-2 infection and should not be used as the sole basis for treatment or other patient management decisions.  A negative result may occur with improper specimen collection / handling, submission of  specimen other than nasopharyngeal swab, presence of viral mutation(s) within the areas targeted by this assay, and inadequate number of viral copies (<250 copies / mL). A negative result must be combined with clinical observations, patient history, and epidemiological information.  Fact Sheet for Patients:   BoilerBrush.com.cyhttps://www.fda.gov/media/136312/download  Fact Sheet for Healthcare Providers: https://pope.com/https://www.fda.gov/media/136313/download  This test is not yet approved or  cleared by the [place] FDA and has been authorized for detection and/or diagnosis of SARS-CoV-2 by FDA under an Emergency Use Authorization (EUA).  This EUA will remain in effect (meaning this test can be used) for the duration of the COVID-19 declaration under Section 564(b)(1) of the Act, 21 U.S.C. section 360bbb-3(b)(1), unless the authorization is terminated or revoked sooner.  Performed at [facility], [address] [place], [place] [address]          Radiology Studies: CT Head Wo Contrast  Result  Date: 09/08/2019 CLINICAL DATA:  Altered mental status.  Decreased responsiveness. EXAM: CT HEAD WITHOUT CONTRAST TECHNIQUE: Contiguous axial images were obtained from the base of the skull through the vertex without intravenous contrast. COMPARISON:  CT head without contrast 05/30/2019 FINDINGS: Brain: Mild atrophy and white matter changes are stable, likely within normal limits for age. No acute infarct, hemorrhage, or mass lesion is present. The ventricles are of normal size. No significant extraaxial fluid collection is present. The brainstem and cerebellum are within normal limits. Vascular: Atherosclerotic calcifications are present within the cavernous internal carotid arteries bilaterally. Skull: Calvarium is intact. No focal lytic or blastic lesions are present. Asymmetric right parietal scalp soft tissue swelling is present without underlying fracture or foreign body. Sinuses/Orbits: The paranasal sinuses and mastoid air cells are clear. The globes and orbits are within normal limits. IMPRESSION: 1. Asymmetric right parietal scalp soft tissue swelling without underlying fracture or foreign body. Question trauma. 2. Stable atrophy and white matter disease, likely within normal limits for age. 3. Atherosclerosis. Electronically Signed   By: Marin Robertshristopher  Mattern M.D.   On: 09/08/2019 05:38   DG Chest Portable 1 View  Result Date: 09/08/2019 CLINICAL DATA:  Altered mental status. EXAM: PORTABLE CHEST 1 VIEW COMPARISON:  09/06/2019 FINDINGS: The cardiac silhouette, mediastinal and hilar contours are within normal limits and stable. Low lung volumes with vascular crowding and streaky atelectasis but no infiltrates or effusions. The bony thorax is intact. IMPRESSION: Low lung volumes with vascular crowding and streaky atelectasis. Electronically Signed   By: Rudie MeyerP.  Gallerani M.D.   On: 09/08/2019 06:19        Scheduled Meds: . amLODipine  10 mg Oral Daily  . atorvastatin  20 mg Oral Daily  .  cholecalciferol  5,000 Units Oral Daily  . clopidogrel  75 mg Oral Daily  . divalproex  1,000 mg Oral QHS  . divalproex  750 mg Oral q morning - 10a  . enoxaparin (LOVENOX) injection  40 mg Subcutaneous Q24H  . folic acid  1 mg Oral Daily  . lamoTRIgine  150 mg Oral BID  . lisinopril  20 mg Oral Daily  . multivitamin with minerals  1 tablet Oral Daily  . omega-3 acid ethyl esters  1,000 mg Oral Daily  . propranolol  20 mg Oral BID  . sertraline  200 mg Oral Daily  . thiamine  100 mg Oral Daily   Or  . thiamine  100 mg Intravenous Daily   Continuous Infusions: . levETIRAcetam       LOS: 1 day    Time spent:  35 minutes.     Alba Cory, MD [facility]   If 7PM-7AM, please contact night-coverage [email or web address]  09/09/2019, 7:23 AM

## 2019-09-10 ENCOUNTER — Inpatient Hospital Stay: Payer: Medicare Other

## 2019-09-10 ENCOUNTER — Inpatient Hospital Stay (HOSPITAL_COMMUNITY)
Admit: 2019-09-10 | Discharge: 2019-09-10 | Disposition: A | Discharge status: Home / Self Care | Payer: Medicare Other | Attending: Internal Medicine | Admitting: Internal Medicine

## 2019-09-10 DIAGNOSIS — R569 Unspecified convulsions: Secondary | ICD-10-CM

## 2019-09-10 DIAGNOSIS — R9431 Abnormal electrocardiogram [ECG] [EKG]: Secondary | ICD-10-CM

## 2019-09-10 DIAGNOSIS — J449 Chronic obstructive pulmonary disease, unspecified: Secondary | ICD-10-CM | POA: Diagnosis not present

## 2019-09-10 LAB — ECHOCARDIOGRAM COMPLETE
AR max vel: 2.6 cm2
AV Area VTI: 3 cm2
AV Area mean vel: 3 cm2
AV Mean grad: 2 mmHg
AV Peak grad: 5.4 mmHg
Ao pk vel: 1.16 m/s
Area-P 1/2: 2.54 cm2
S' Lateral: 3.43 cm

## 2019-09-10 LAB — CBC
HCT: 38.9 % — ABNORMAL LOW (ref 39.0–52.0)
Hemoglobin: 13.3 g/dL (ref 13.0–17.0)
MCH: 32.8 pg (ref 26.0–34.0)
MCHC: 34.2 g/dL (ref 30.0–36.0)
MCV: 95.8 fL (ref 80.0–100.0)
Platelets: 178 10*3/uL (ref 150–400)
RBC: 4.06 MIL/uL — ABNORMAL LOW (ref 4.22–5.81)
RDW: 11.9 % (ref 11.5–15.5)
WBC: 6.3 10*3/uL (ref 4.0–10.5)
nRBC: 0 % (ref 0.0–0.2)

## 2019-09-10 LAB — TSH: TSH: 2.66 u[IU]/mL (ref 0.350–4.500)

## 2019-09-10 LAB — BASIC METABOLIC PANEL
Anion gap: 8 (ref 5–15)
BUN: 12 mg/dL (ref 8–23)
CO2: 30 mmol/L (ref 22–32)
Calcium: 9.1 mg/dL (ref 8.9–10.3)
Chloride: 100 mmol/L (ref 98–111)
Creatinine, Ser: 0.76 mg/dL (ref 0.61–1.24)
GFR calc Af Amer: >60 mL/min (ref >60)
GFR calc non Af Amer: >60 mL/min (ref >60)
Glucose, Bld: 99 mg/dL (ref 70–99)
Potassium: 4.1 mmol/L (ref 3.5–5.1)
Sodium: 138 mmol/L (ref 135–145)

## 2019-09-10 LAB — AMMONIA: Ammonia: 44 umol/L — ABNORMAL HIGH (ref 9–35)

## 2019-09-10 LAB — VITAMIN B12: Vitamin B-12: 614 pg/mL (ref 180–914)

## 2019-09-10 IMAGING — CT CT HEAD W/O CM
3 series · 16 of 47 positions shown, 19 images · non-contrast
Comparison: [DATE]

CLINICAL DATA: Altered mental status and seizures.

EXAM:
CT HEAD WITHOUT CONTRAST
TECHNIQUE: Contiguous axial images were obtained from the base of the skull
through the vertex without intravenous contrast.

[Series 2: head wo · axial · 0.47mm/px · z∈[-111,+44]mm · 10 of 37 slices shown, 13 images]
[im 3/37  brain]
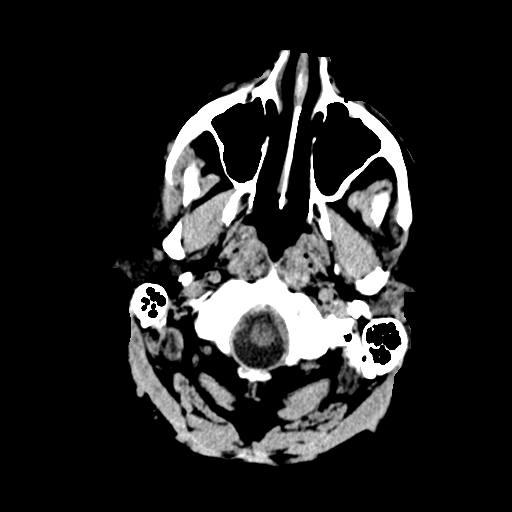
[im 3/37  bone]
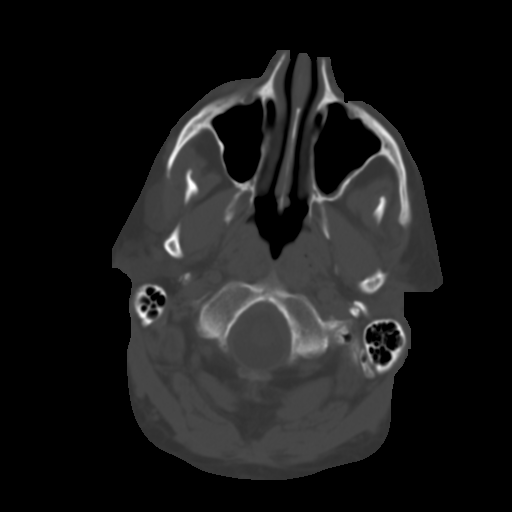
[im 7/37  brain]
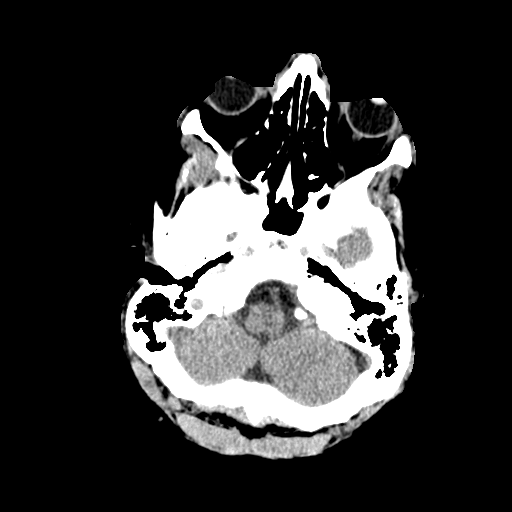
[im 10/37  brain]
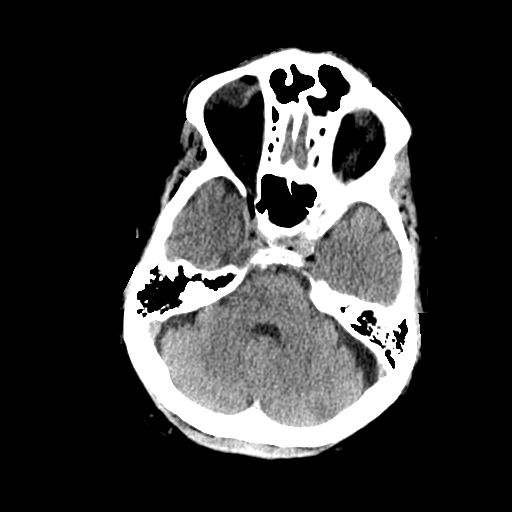
[im 13/37  brain]
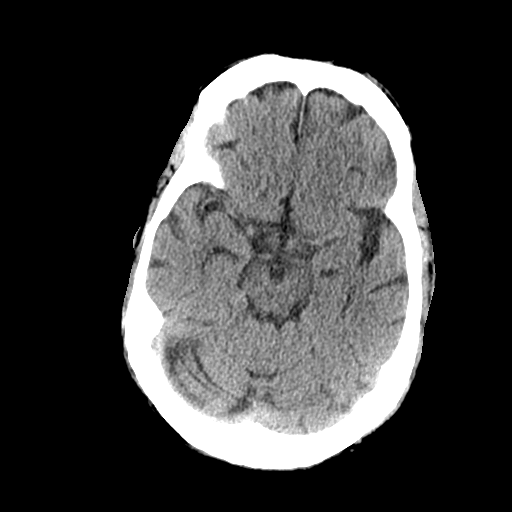
[im 17/37  brain]
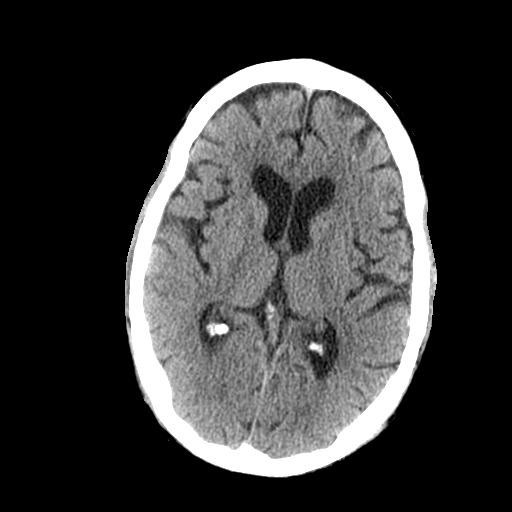
[im 17/37  bone]
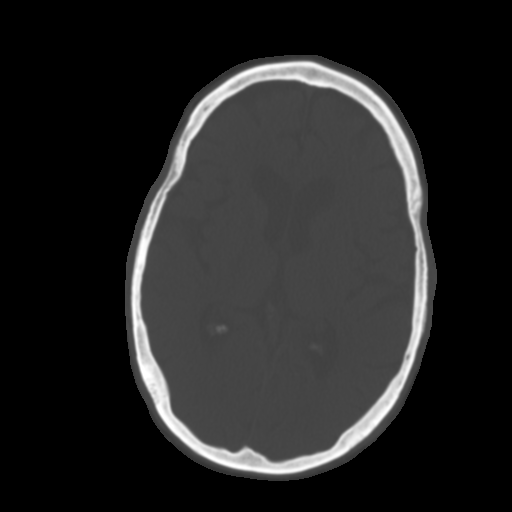
[im 20/37  brain]
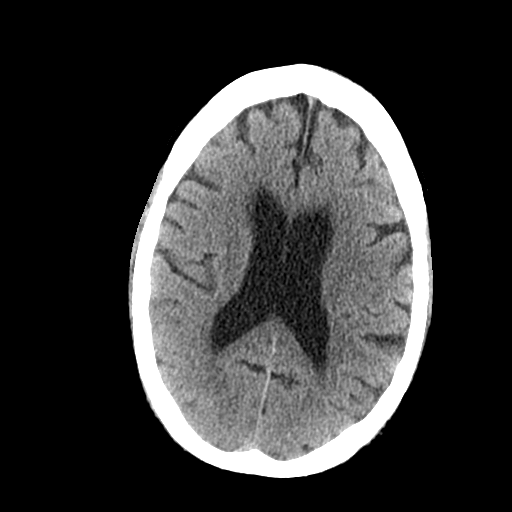
[im 24/37  brain]
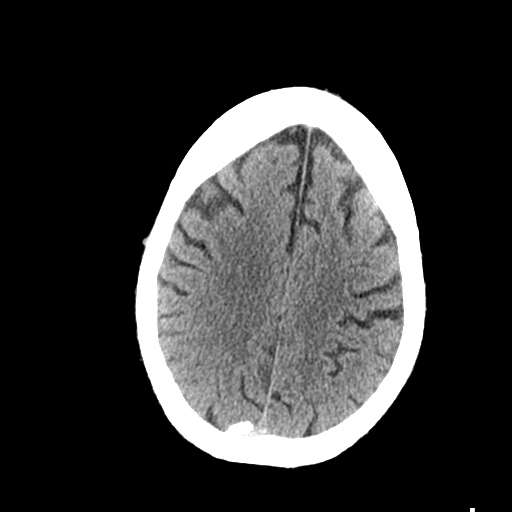
[im 28/37  brain]
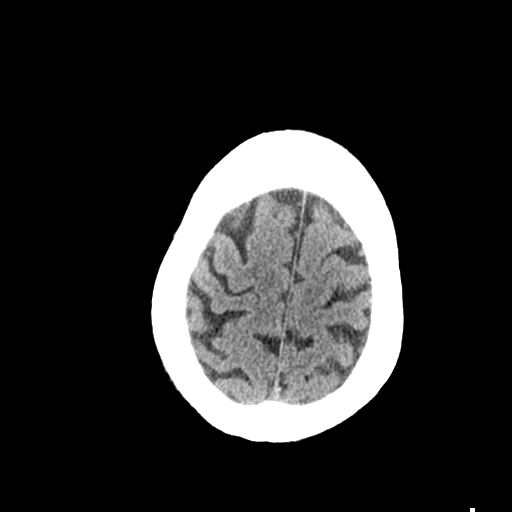
[im 30/37  brain]
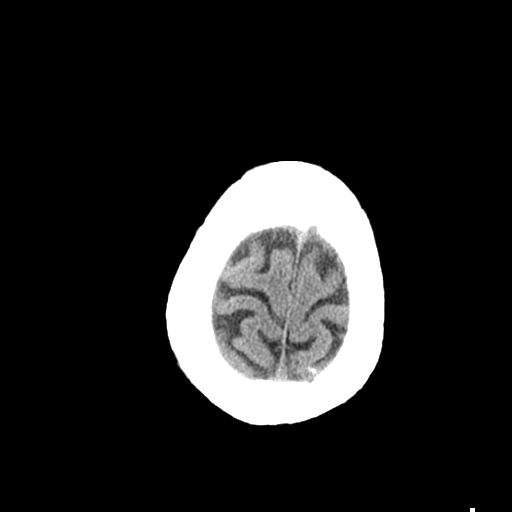
[im 30/37  bone]
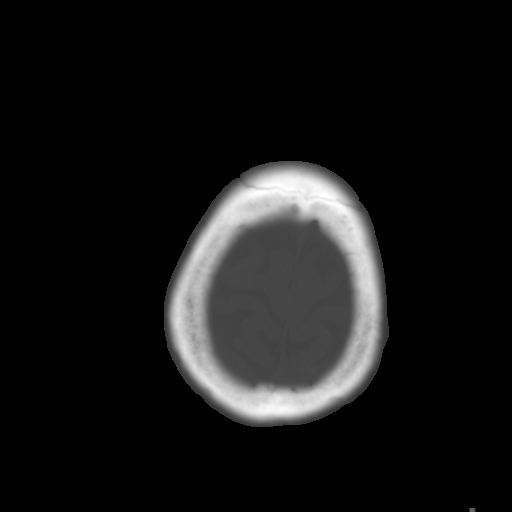
[im 34/37  brain]
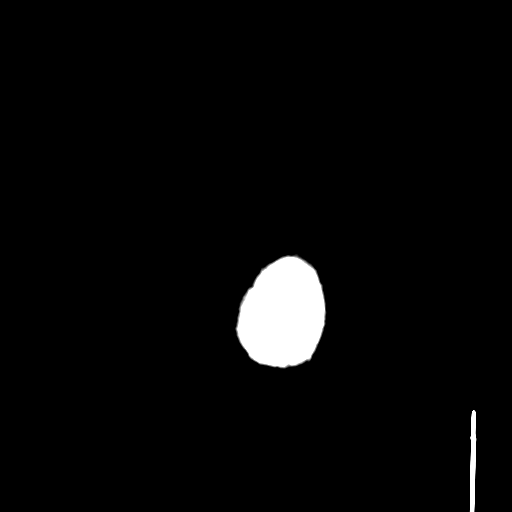

[Series 4: coronal soft tissue · coronal · 0.36mm/px · 3 of 76 slices shown]
[im 26/76  brain]
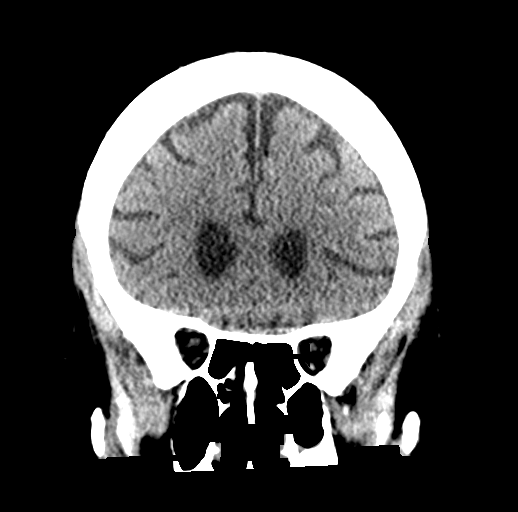
[im 34/76  brain]
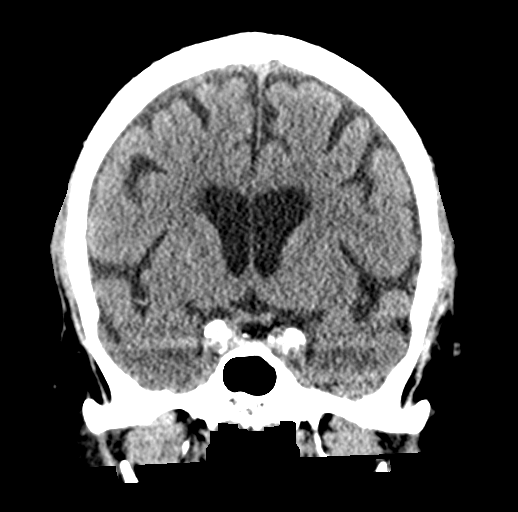
[im 42/76  brain]
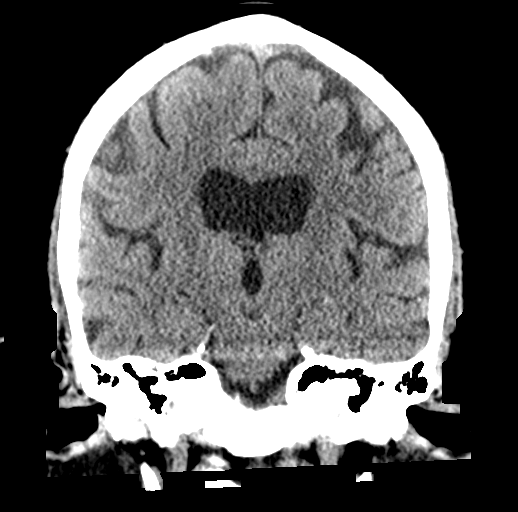

[Series 5: sagittal soft tissue · sagittal · 0.36mm/px · 3 of 61 slices shown]
[im 21/61  brain]
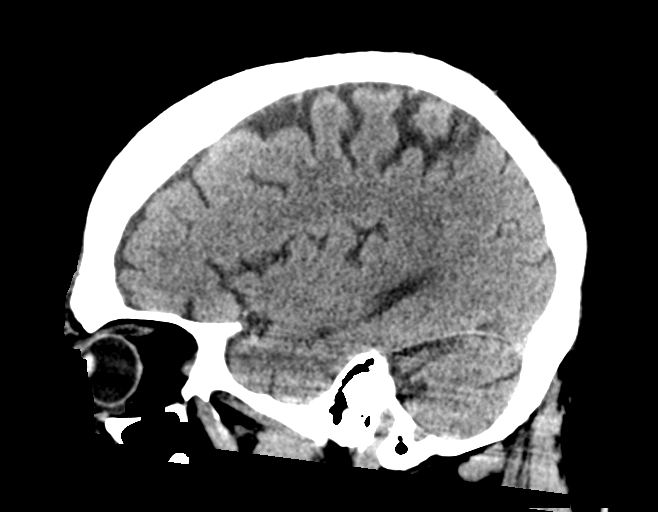
[im 31/61  brain]
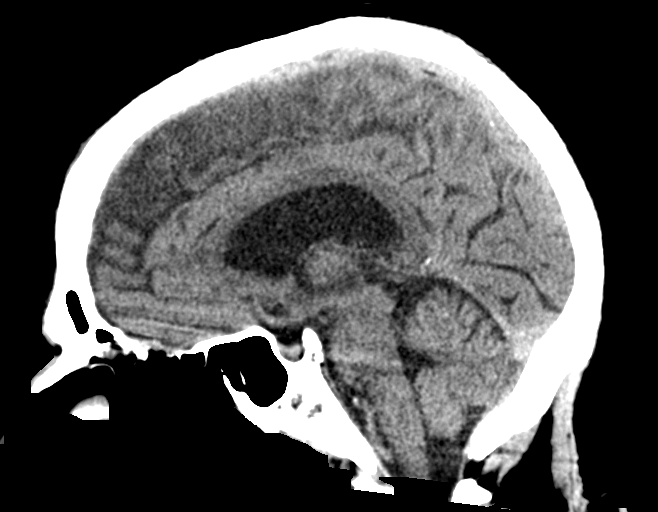
[im 41/61  brain]
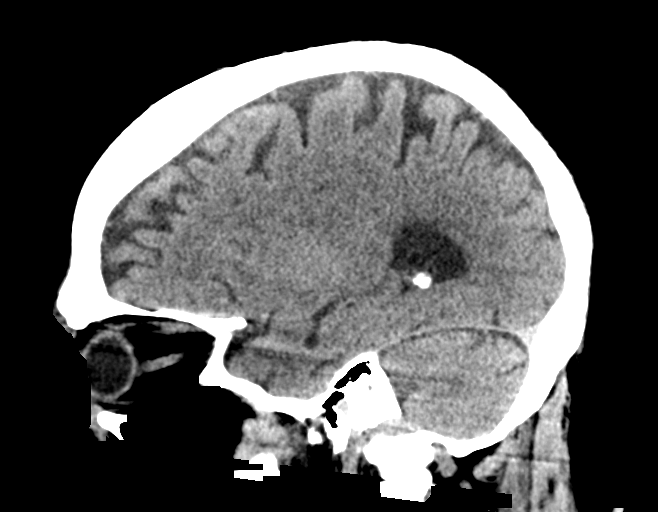

[16 of 47 positions shown; findings below may reference images not displayed]

FINDINGS: Brain: There is mild cerebral atrophy with widening of the
extra-axial spaces and ventricular dilatation.
There are areas of decreased attenuation within the white matter
tracts of the supratentorial brain, consistent with microvascular
disease changes.

Vascular: No hyperdense vessel or unexpected calcification.

Skull: Normal. Negative for fracture or focal lesion.

Sinuses/Orbits: No acute finding.

Other: None.
IMPRESSION: 1. Generalized cerebral atrophy.
2. No acute intracranial abnormality.

## 2019-09-10 IMAGING — US US CAROTID DUPLEX BILAT
1 series · 13 of 24 positions shown · non-contrast
Comparison: None.

CLINICAL DATA: 74-year-old male with a history of stroke

EXAM:
BILATERAL CAROTID DUPLEX ULTRASOUND
TECHNIQUE: Gray scale imaging, color Doppler and duplex ultrasound were
performed of bilateral carotid and vertebral arteries in the neck.

[Series 1: us carotid bilateral · 13 of 66 slices shown]
[im 1/66]
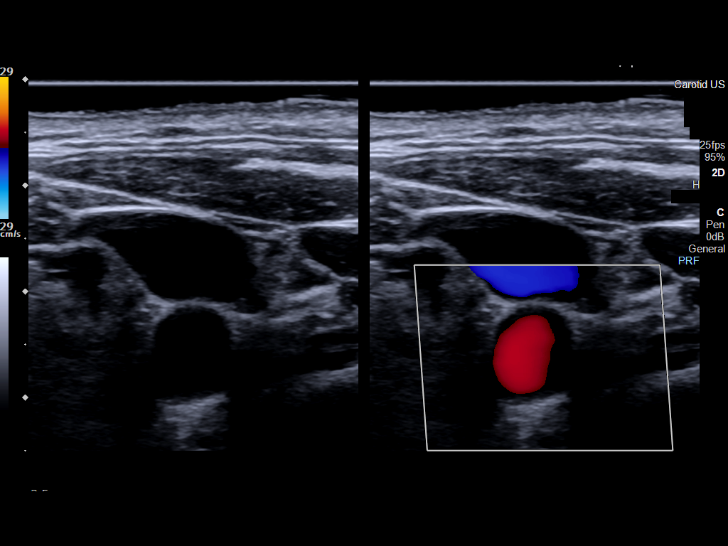
[im 6/66]
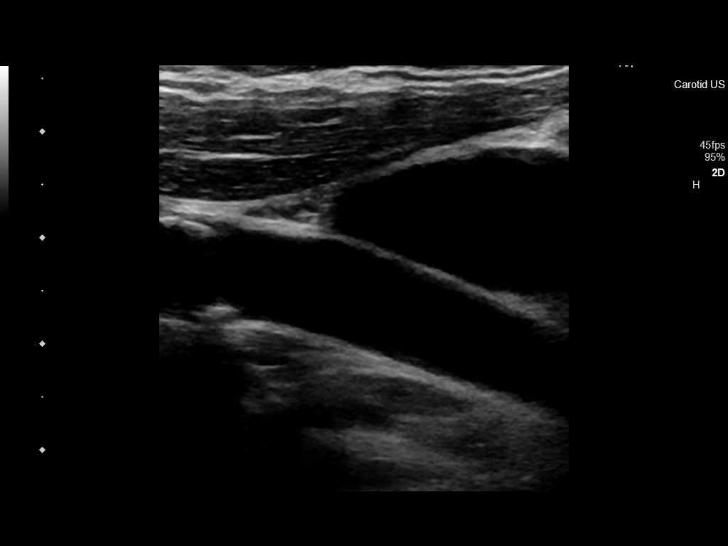
[im 12/66]
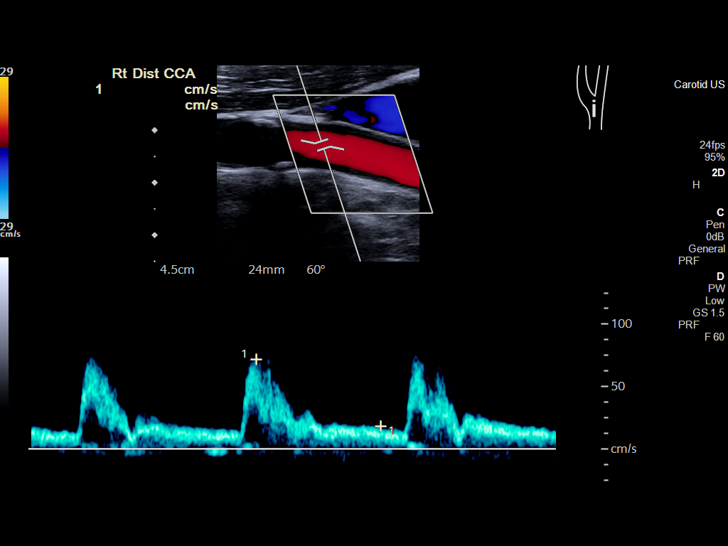
[im 17/66]
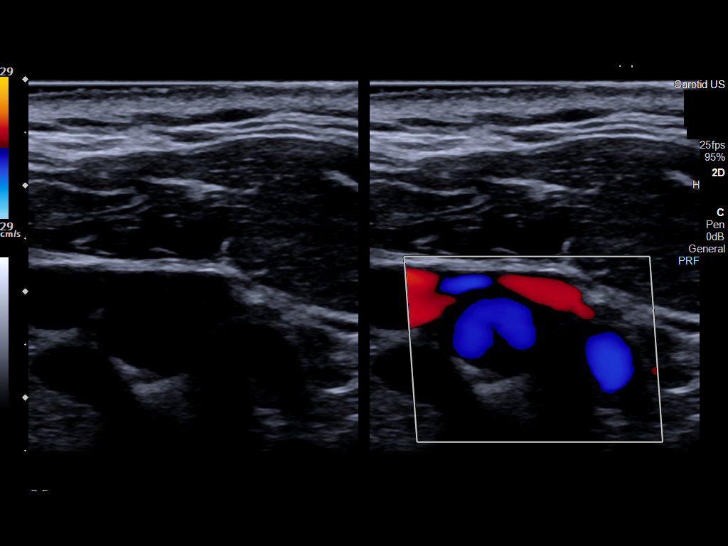
[im 23/66]
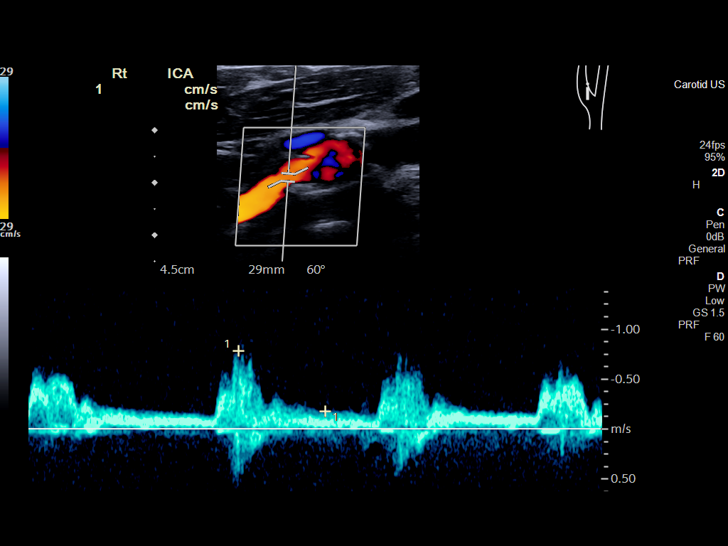
[im 29/66]
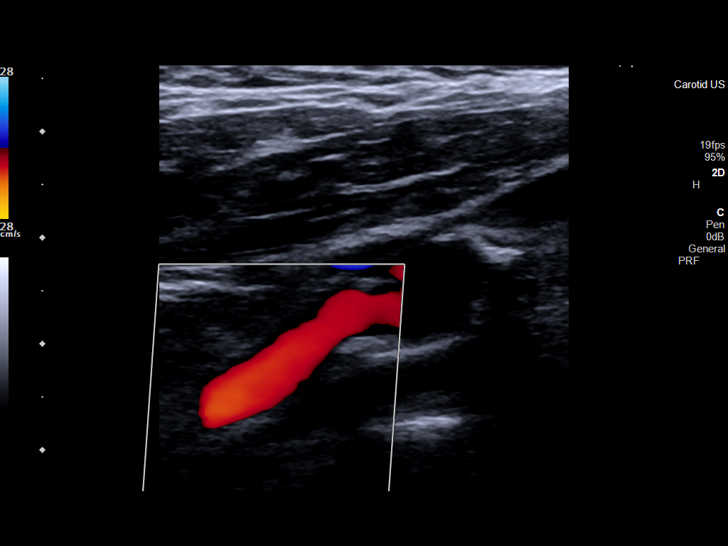
[im 34/66]
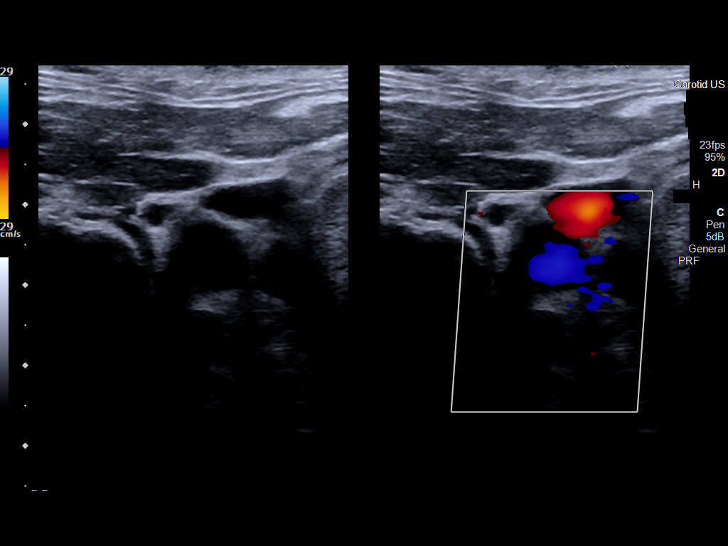
[im 37/66]
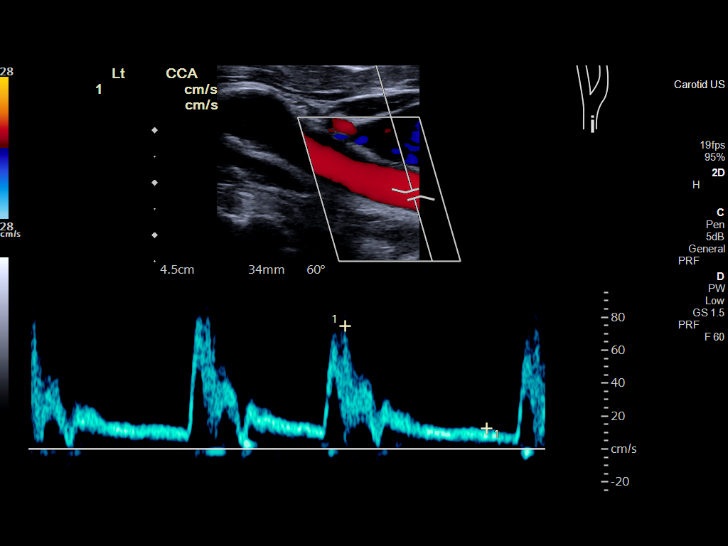
[im 43/66]
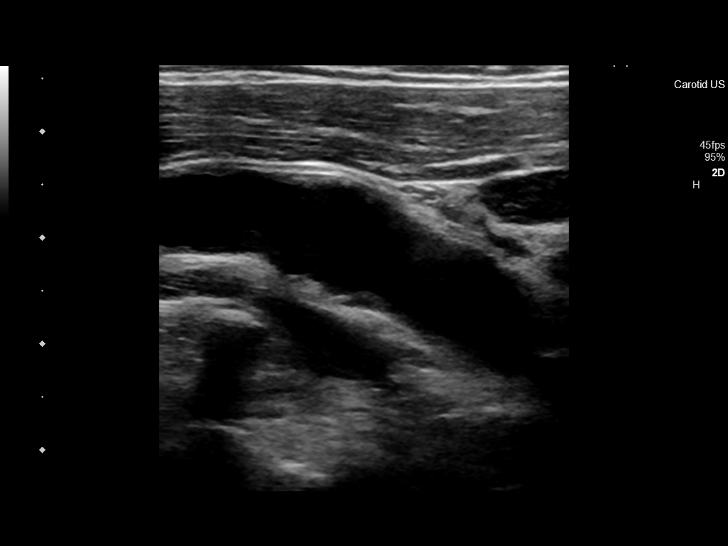
[im 49/66]
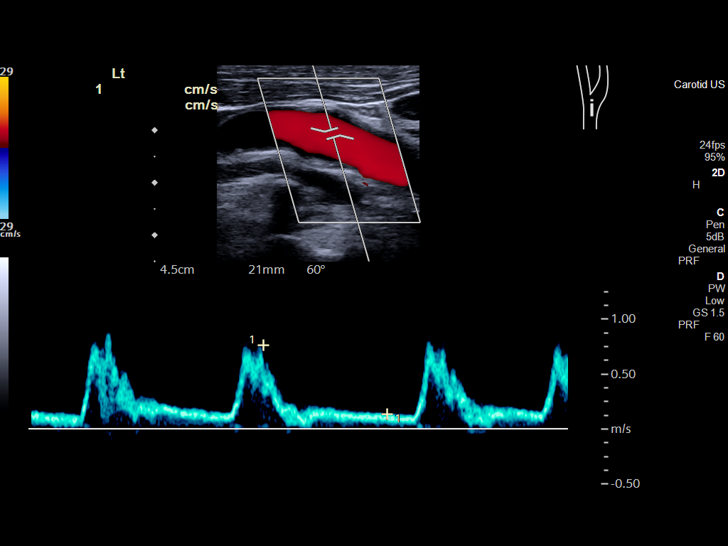
[im 54/66]
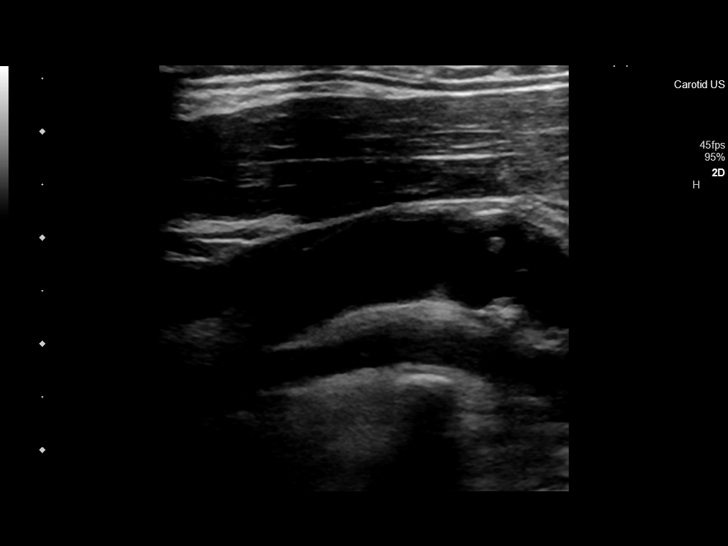
[im 60/66]
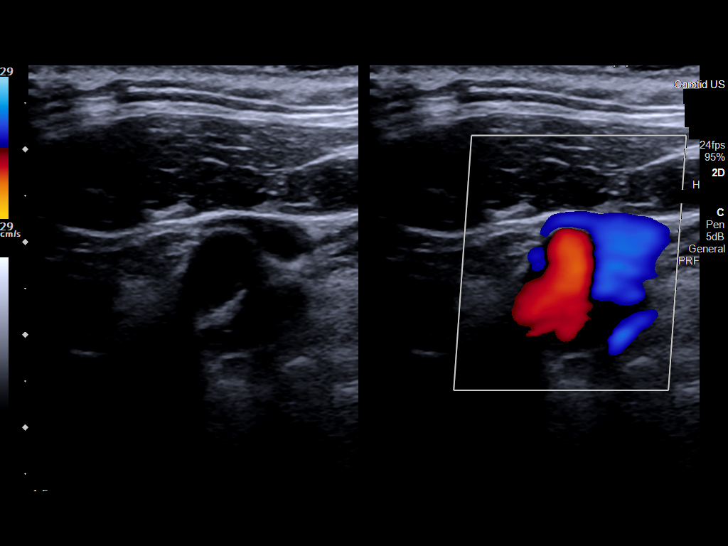
[im 66/66]
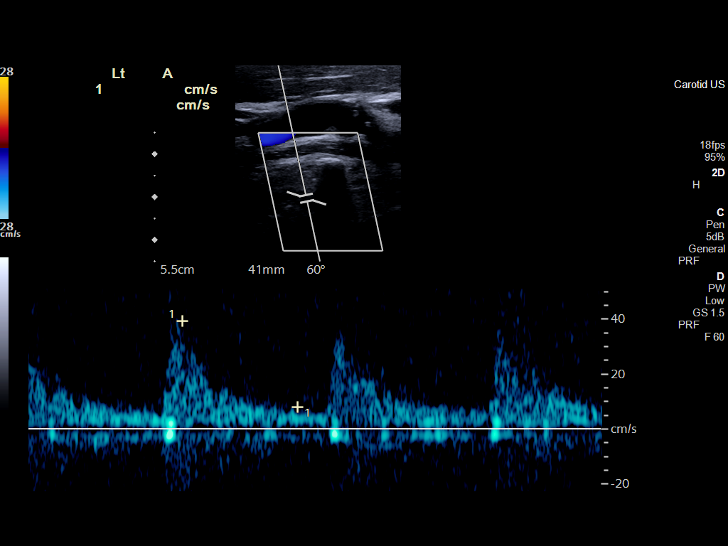

[13 of 24 positions shown; findings below may reference images not displayed]

FINDINGS: Criteria: Quantification of carotid stenosis is based on velocity
parameters that correlate the residual internal carotid diameter
with NASCET-based stenosis levels, using the diameter of the distal
internal carotid lumen as the denominator for stenosis measurement.

The following velocity measurements were obtained:

RIGHT

ICA:  Systolic 83 cm/sec, Diastolic 23 cm/sec

CCA:  65 cm/sec

SYSTOLIC ICA/CCA RATIO:

ECA:  112 cm/sec

LEFT

ICA:  Systolic 91 cm/sec, Diastolic 27 cm/sec

CCA:  113 cm/sec

SYSTOLIC ICA/CCA RATIO:

ECA:  96 cm/sec

Right Brachial SBP: Not acquired

Left Brachial SBP: Not acquired

RIGHT CAROTID ARTERY: No significant calcifications of the right
common carotid artery. Intermediate waveform maintained.
Heterogeneous and partially calcified plaque at the right carotid
bifurcation. No significant lumen shadowing. Low resistance waveform
of the right ICA. No significant tortuosity.

RIGHT VERTEBRAL ARTERY: Antegrade flow with low resistance waveform.

LEFT CAROTID ARTERY: No significant calcifications of the left
common carotid artery. Intermediate waveform maintained.
Heterogeneous and partially calcified plaque at the left carotid
bifurcation without significant lumen shadowing. Low resistance
waveform of the left ICA. No significant tortuosity.

LEFT VERTEBRAL ARTERY:  Antegrade flow with low resistance waveform.
IMPRESSION: Color duplex indicates minimal heterogeneous and calcified plaque,
with no hemodynamically significant stenosis by duplex criteria in
the extracranial cerebrovascular circulation.

## 2019-09-10 IMAGING — MR MR MRA HEAD W/O CM
1 series · 20 of 48 positions shown · non-contrast
Comparison: None.

CLINICAL DATA: Stroke, follow-up

EXAM:
MRA HEAD WITHOUT CONTRAST
TECHNIQUE: Angiographic images of the Circle of Willis were obtained using MRA
technique without intravenous contrast.

[Series 5: TOF · axial · 0.5mm · 0.41mm/px · z∈[-99,-2]mm · 20 of 205 slices shown]
[im 1/205]
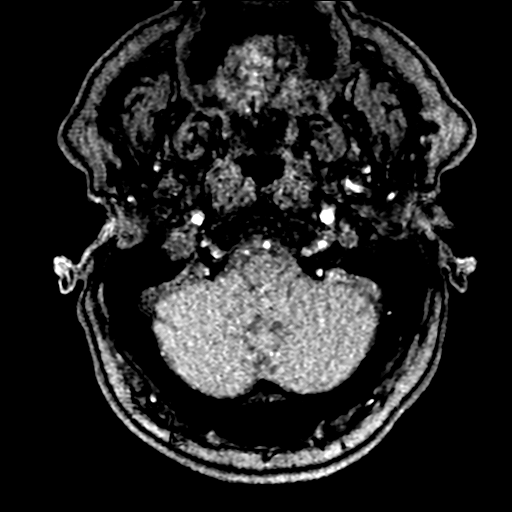
[im 5/205]
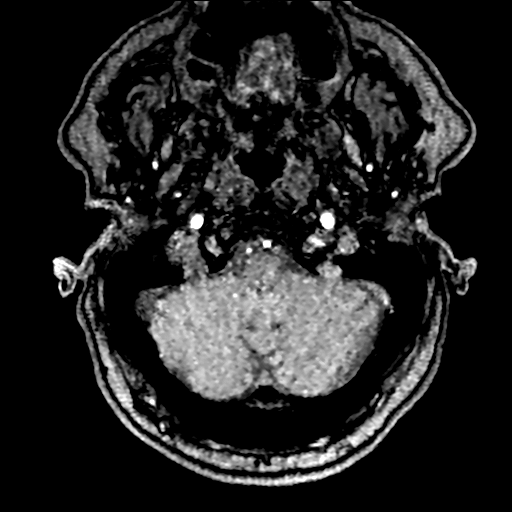
[im 9/205]
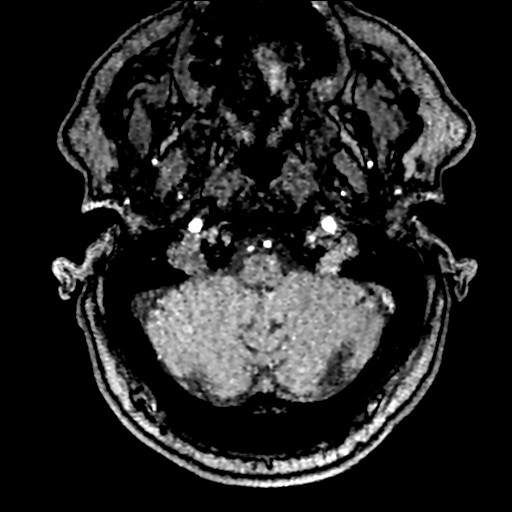
[im 14/205]
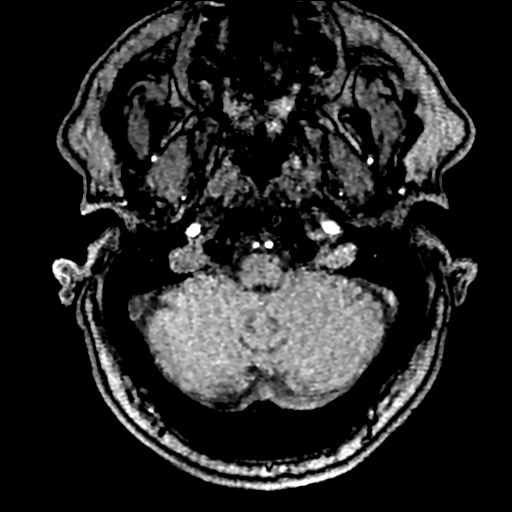
[im 18/205]
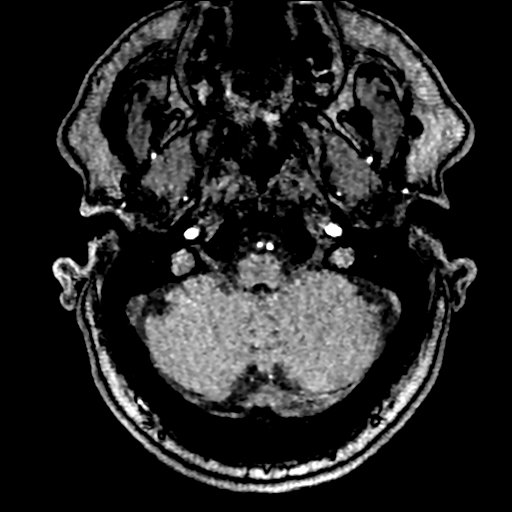
[im 22/205]
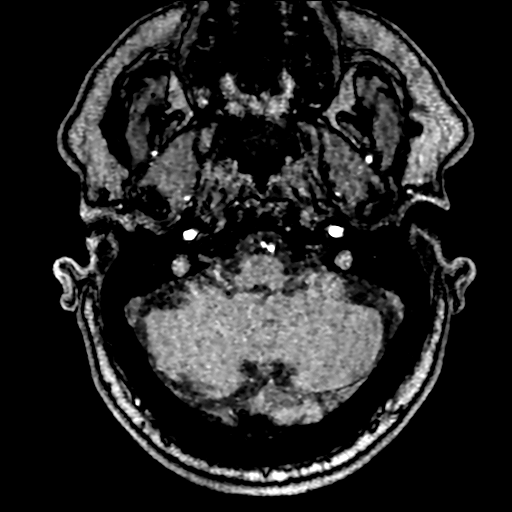
[im 27/205]
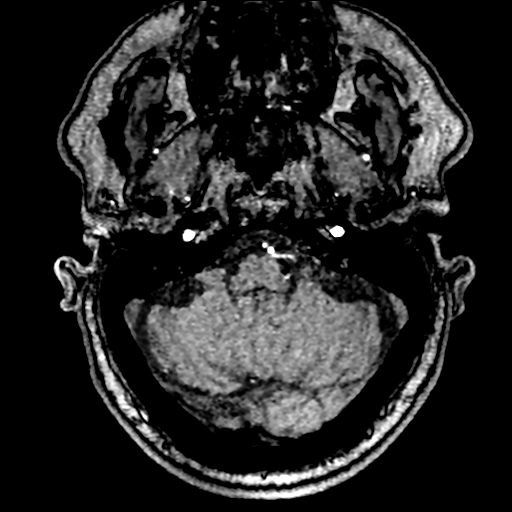
[im 31/205]
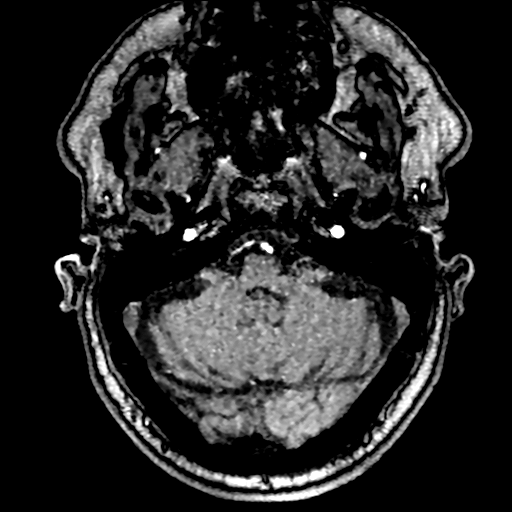
[im 35/205]
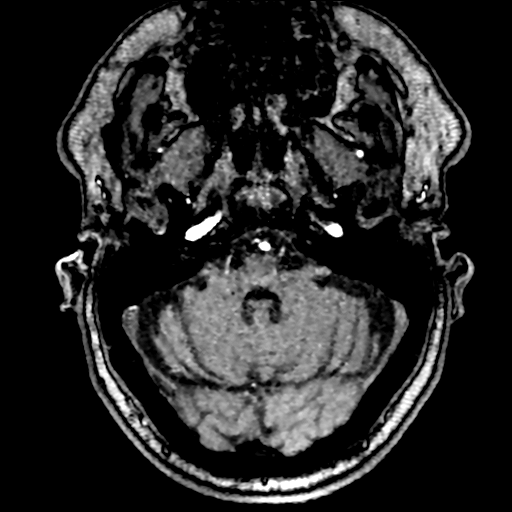
[im 40/205]
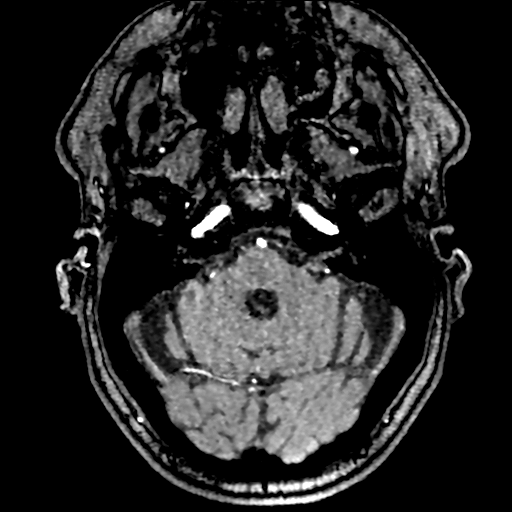
[im 44/205]
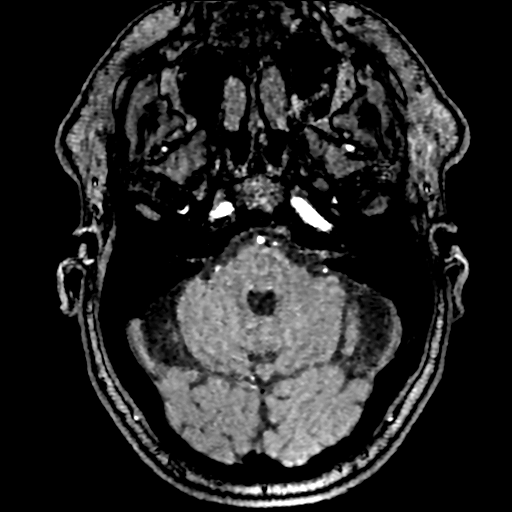
[im 48/205]
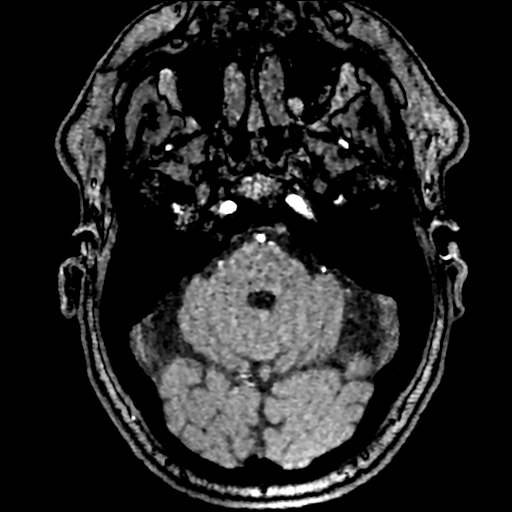
[im 66/205]
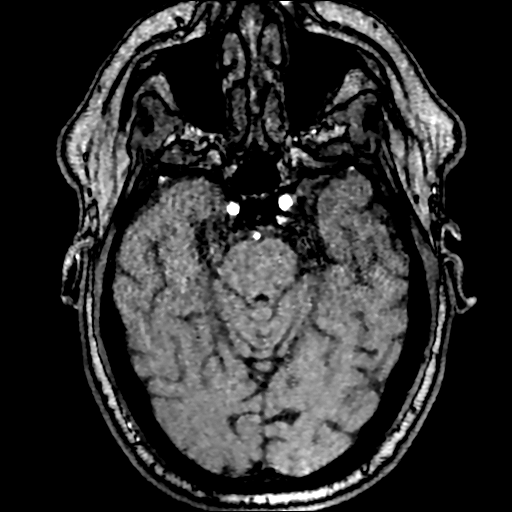
[im 92/205]
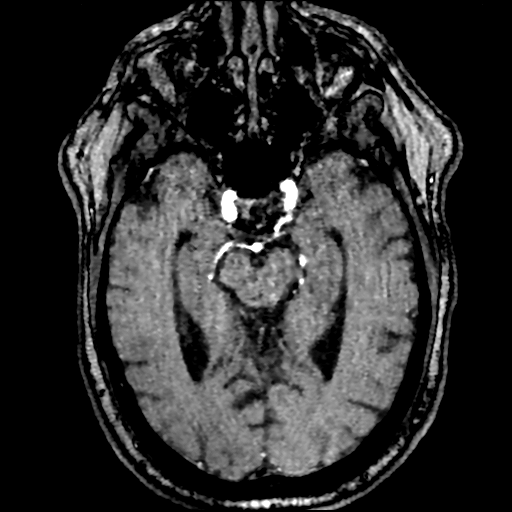
[im 105/205]
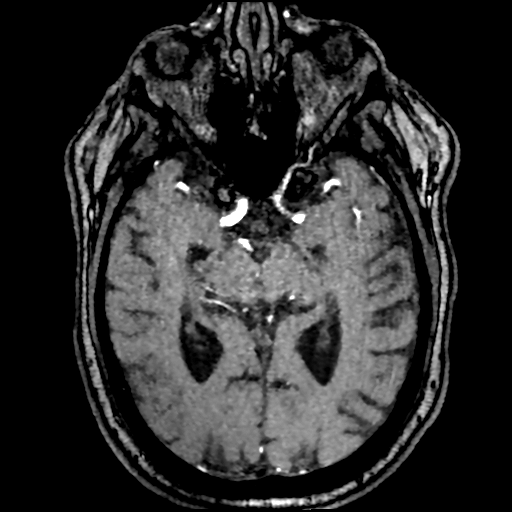
[im 118/205]
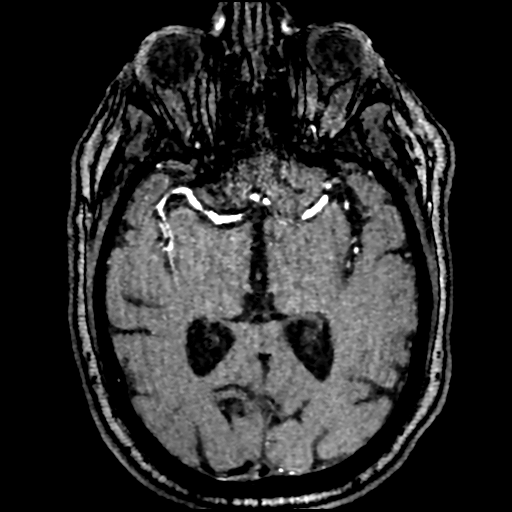
[im 144/205]
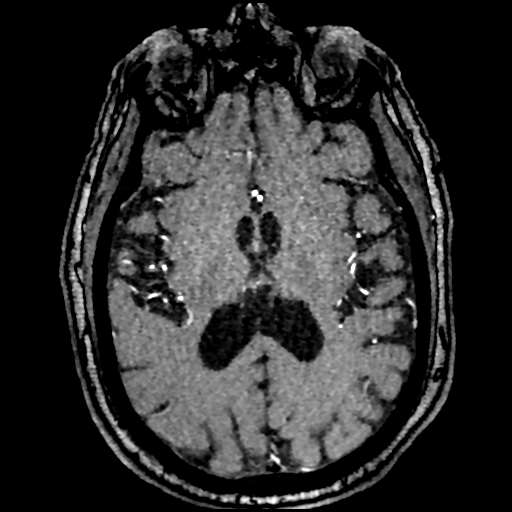
[im 170/205]
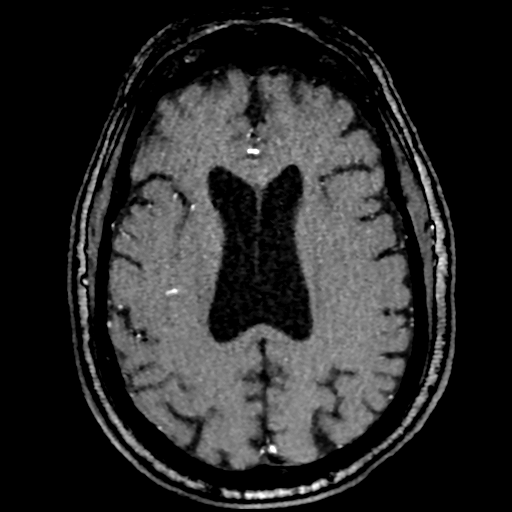
[im 174/205]
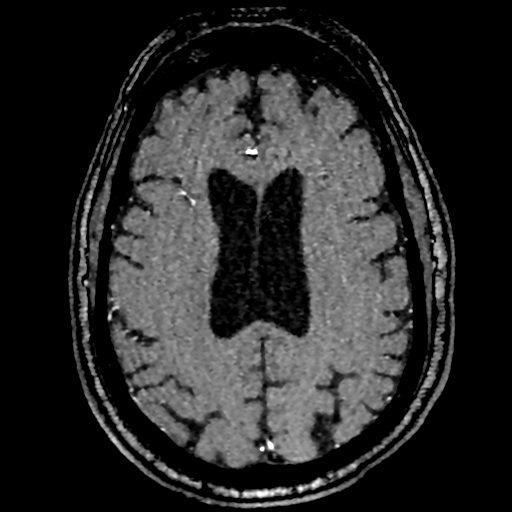
[im 196/205]
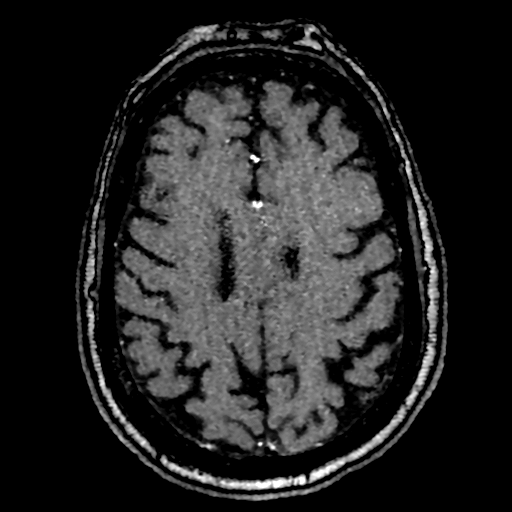

[20 of 48 positions shown; findings below may reference images not displayed]

FINDINGS: Motion artifact is present intracranial internal carotid arteries
are patent. Middle and anterior cerebral arteries are patent.
Intracranial vertebral arteries are partially included but patent.
Basilar and posterior cerebral arteries are patent. Bilateral
posterior communicating arteries are present. There is no
significant stenosis or aneurysm.
IMPRESSION: No proximal intracranial vessel occlusion or significant stenosis.

## 2019-09-10 MED ORDER — LACTULOSE 10 GM/15ML PO SOLN
20.0000 g | Freq: Every day | ORAL | Status: DC
  Administered 2019-09-10 – 2019-09-14 (×5): 20 g via ORAL
  Filled 2019-09-10 (×4): qty 30

## 2019-09-10 MED ORDER — STROKE: EARLY STAGES OF RECOVERY BOOK: Freq: Once | Status: DC

## 2019-09-10 MED ORDER — DIVALPROEX SODIUM 125 MG PO CSDR
250.0000 mg | DELAYED_RELEASE_CAPSULE | Freq: Two times a day (BID) | ORAL | Status: DC
  Administered 2019-09-10 – 2019-09-14 (×8): 250 mg via ORAL
  Filled 2019-09-10 (×10): qty 2

## 2019-09-10 NOTE — Progress Notes (Signed)
PT Cancellation Note  Patient Details Name: Ian Gill MRN: [id number] DOB: October 07, 1945   Cancelled Treatment:    Reason Eval/Treat Not Completed: Other (comment). Hold PT this session pending full stroke workup; will re-attempt as appropriate (and within context of pending ORIF 8/15 as well.)   Dian Situ 09/10/2019, 12:42 PM

## 2019-09-10 NOTE — Progress Notes (Signed)
Notified by two Nurse Techs that patient had a fall and found patient on the floor on his backside. This occurred shortly after report given from the previous shift Nurse. Immediately went to room to assess patient. Patient states he is okay and when asked if he hit his head, patient states no. No open areas noted on any part of the his body that wasn't already there from the previous falls prior to hosp visit. RN and staff assisted patient back to the bed. V/S were taken and were stable. Patient states he had to use the restroom ( both BM / voiding)  but wanted to get in his wheelchair. Patient placed on bedpan for he is NWB on the right leg due to the leg fracture in which he is to have surgery on 09/11/19. RN and staff reoriented patient. Patient complains of no pain. Nurse Practitioner - Marisa Cyphers. Was notified. NP came to assess patient. Orders for CT of head and results are negative for any abnormalities. Administered scheduled meds and patient tolerated them well. Notified son Loraine Leriche Lacosse this morning. Informed son of patient having a fall and that we have been monitoring him every two hours with vitals signs being taken  since the fal,l for the entire shift. Son was appreciative of RN calling. Patient is currently NPO for procedure and is resting. Will continue to monitor patient to end of shift.

## 2019-09-10 NOTE — Progress Notes (Signed)
SLP Cancellation Note  Patient Details Name: Ian Gill MRN: [id number] DOB: Feb 29, 1945   Cancelled treatment:       Reason Eval/Treat Not Completed: SLP screened, no needs identified, will sign off   NPO and ST consult placed a as part of the Stroke Order set. Pt with recent evaluation on 09/09/2019. Per conversation with Dr Wilford Corner, pt with no acute changes since ST evaluation. Per Dr. Wilford Corner, pt may resume previous diet and discharge ST order.   Ian Gill B. [name] M.S., CCC-SLP, [facility] Speech-Language Pathologist Rehabilitation Services Office [phone]  [name] 09/10/2019, 11:14 AM

## 2019-09-10 NOTE — Progress Notes (Signed)
Daily Progress Note   Subjective  - * No surgery date entered *  Pt off floor.  No new developments.  Getting Echo today. Neuro eval complete   Objective Vitals:   09/10/19 0031 09/10/19 0427 09/10/19 0821 09/10/19 1051  BP: 136/70 (!) 148/64 (!) 157/69 (!) 148/75  Pulse: 63 (!) 59 63 68  Resp: 16 16 17    Temp: 97.8 F (36.6 C) 98 F (36.7 C) 98.2 F (36.8 C)   TempSrc: Oral Oral    SpO2: 96% 98% 95%   Weight:      Height:        Physical Exam: Pt remains in boot  Laboratory CBC    Component Value Date/Time   WBC 6.3 09/10/2019 0614   HGB 13.3 09/10/2019 0614   HGB 14.0 02/15/2014 2016   HCT 38.9 (L) 09/10/2019 0614   HCT 42.2 02/15/2014 2016   PLT 178 09/10/2019 0614   PLT 160 02/15/2014 2016    BMET    Component Value Date/Time   NA 138 09/10/2019 0614   NA 143 02/15/2014 2016   K 4.1 09/10/2019 0614   K 4.3 02/15/2014 2016   CL 100 09/10/2019 0614   CL 110 (H) 02/15/2014 2016   CO2 30 09/10/2019 0614   CO2 28 02/15/2014 2016   GLUCOSE 99 09/10/2019 0614   GLUCOSE 103 (H) 02/15/2014 2016   BUN 12 09/10/2019 0614   BUN 17 02/15/2014 2016   CREATININE 0.76 09/10/2019 0614   CREATININE 1.09 02/15/2014 2016   CALCIUM 9.1 09/10/2019 0614   CALCIUM 8.9 02/15/2014 2016   GFRNONAA >60 09/10/2019 0614   GFRNONAA >60 02/15/2014 2016   GFRAA >60 09/10/2019 0614   GFRAA >60 02/15/2014 2016    Assessment/Planning: Right ankle fracture   D/W pts son (POA) yesterday and understands plan for surgery tomorrow.  Awaiting Echo.  If medically cleared will proceed.  C/W NWB.  Left 2nd toe with subluxation.  May evaluate in OR and attempt closed reduction.  2017 A  09/10/2019, 12:10 PM

## 2019-09-10 NOTE — Consult Note (Addendum)
Neurology Consultation  Reason for Consult: seizures, AMS, Stroke Referring Physician: Dr Sunnie Nielsen  CC: Altered mental status, concern for seizure, stroke on MRI  History is obtained from: Patient, chart review, patient's son over the phone  HPI: Ian Gill is a 74 y.o. male past medical history of stroke with no residual deficits but post stroke seizures for which he is on Keppra, Lamictal, Depakote-which is being reduced per outpatient neurology due to tremors, presented to the hospital-admitted on 09/08/2019 for altered mental status and concern for having had a seizure. The patient told me that he has had seizures ever since the age of 74 years old and had seizures till his early 82s when they stopped happening.  At that time he was taken off of his Dilantin.  He remained seizure-free for 30 already years and then had seizures again.  The son reports that he was seizure-free for many many years and in his late 61s early 63s, he started drinking heavily and also had a stroke and then started having seizures again. Son is also reported decline in his memory-he is taken over finances in the past 6 months because the patient forgot to pay multiple credit cards and other bills. Patient was recently discharged home from a skilled nursing facility after an ankle fracture, and was noted by family to be confused.  His last known normal was about 3 days ago according to the son and over the past 3 days or so he has not acted like himself and at times has had more confusion than others. He was admitted by the medicine service, antiepileptic level checked-valproate was at 11 which is subtherapeutic but I think that is in the setting of medication being tapered down. UDS was negative.  Covid PCR was negative.  No alcohol detectable.  Normal renal function. In the emergency room, he had severe hypertension with systolic blood pressure as high as 193. He had also complained of blurred vision.  There was  concern that he might have had a seizure-son could not sure that he was compliant with medications after having been discharged from skilled nursing facility back to his independent living apartment a week ago.  An MRI was done to complete the work-up for altered mental status that showed 2 punctate areas of restricted diffusion in the left corona radiata.  Neurology consult was obtained for altered mental status, seizure and this MRI finding concerning for stroke.   Chart review reveals: For the right hemibody Parkinson features, he has tried Sinemet without much benefit.  He was at some point on primidone which did help but he discontinued it.  Outpatient neurology evaluation not consistent with Parkinson syndrome.  Unclear if this is essential tremor but patient reports his tremors have worsened over the past few months.  LKW: No clear available-sometime on 09/07/2019 tpa given?: no, outside the window, imaging finding likely artifactual. Premorbid modified Rankin scale (mRS):2  ROS: Performed and negative except as noted in HPI  Past Medical History:  Diagnosis Date   Anxiety    COPD (chronic obstructive pulmonary disease) (HCC)    Depression    Parkinson's disease (HCC)    Seizures (HCC)    Stroke (HCC)    Family History  Problem Relation Age of Onset   COPD Father    Social History:   reports that he has quit smoking. He has never used smokeless tobacco. He reports current alcohol use of about 3.0 standard drinks of alcohol per week. He reports that  he does not use drugs.  Medications  Current Facility-Administered Medications:    acetaminophen (TYLENOL) tablet 650 mg, 650 mg, Oral, Q6H PRN **OR** acetaminophen (TYLENOL) suppository 650 mg, 650 mg, Rectal, Q6H PRN, Lorretta HarpNiu, Xilin, MD   amLODipine (NORVASC) tablet 10 mg, 10 mg, Oral, Daily, Lorretta HarpNiu, Xilin, MD, 10 mg at 09/09/19 1034   atorvastatin (LIPITOR) tablet 20 mg, 20 mg, Oral, Daily, Lorretta HarpNiu, Xilin, MD, 20 mg at 09/09/19  1035   cholecalciferol (VITAMIN D3) tablet 5,000 Units, 5,000 Units, Oral, Daily, Lorretta HarpNiu, Xilin, MD, 5,000 Units at 09/09/19 1539   clopidogrel (PLAVIX) tablet 75 mg, 75 mg, Oral, Daily, Lorretta HarpNiu, Xilin, MD, 75 mg at 09/09/19 1034   divalproex (DEPAKOTE) DR tablet 1,000 mg, 1,000 mg, Oral, QHS, Lorretta HarpNiu, Xilin, MD, 1,000 mg at 09/09/19 2223   divalproex (DEPAKOTE) DR tablet 750 mg, 750 mg, Oral, q morning - 10a, Lorretta HarpNiu, Xilin, MD, 750 mg at 09/09/19 1035   enoxaparin (LOVENOX) injection 40 mg, 40 mg, Subcutaneous, Q24H, Lorretta HarpNiu, Xilin, MD, 40 mg at 09/09/19 2222   folic acid (FOLVITE) tablet 1 mg, 1 mg, Oral, Daily, Lorretta HarpNiu, Xilin, MD, 1 mg at 09/09/19 1034   hydrALAZINE (APRESOLINE) injection 5 mg, 5 mg, Intravenous, Q2H PRN, Lorretta HarpNiu, Xilin, MD   lamoTRIgine (LAMICTAL) tablet 150 mg, 150 mg, Oral, BID, Lorretta HarpNiu, Xilin, MD, 150 mg at 09/09/19 2224   levETIRAcetam (KEPPRA) IVPB 1500 mg/ 100 mL premix, 1,500 mg, Intravenous, Q12H, Regalado, Belkys A, MD, Paused at 09/09/19 2344   lisinopril (ZESTRIL) tablet 20 mg, 20 mg, Oral, Daily, Lorretta HarpNiu, Xilin, MD, 20 mg at 09/09/19 1035   multivitamin with minerals tablet 1 tablet, 1 tablet, Oral, Daily, Lorretta HarpNiu, Xilin, MD, 1 tablet at 09/09/19 1035   omega-3 acid ethyl esters (LOVAZA) capsule 1,000 mg, 1,000 mg, Oral, Daily, Lorretta HarpNiu, Xilin, MD, 1,000 mg at 09/09/19 1034   ondansetron (ZOFRAN) injection 4 mg, 4 mg, Intravenous, Q8H PRN, Lorretta HarpNiu, Xilin, MD   propranolol (INDERAL) tablet 20 mg, 20 mg, Oral, BID, Lorretta HarpNiu, Xilin, MD, 20 mg at 09/09/19 2223   sertraline (ZOLOFT) tablet 200 mg, 200 mg, Oral, Daily, Lorretta HarpNiu, Xilin, MD, 200 mg at 09/09/19 1035   thiamine tablet 100 mg, 100 mg, Oral, Daily, 100 mg at 09/09/19 1035 **OR** thiamine (B-1) injection 100 mg, 100 mg, Intravenous, Daily, Lorretta HarpNiu, Xilin, MD   Exam: Current vital signs: BP (!) 157/69 (BP Location: Right Arm)    Pulse 63    Temp 98.2 F (36.8 C)    Resp 17    Ht 5\' 11"  (1.803 m)    Wt 104.3 kg    SpO2 95%    BMI 32.08 kg/m  Vital  signs in last 24 hours: Temp:  [97.8 F (36.6 C)-98.6 F (37 C)] 98.2 F (36.8 C) (08/14 0821) Pulse Rate:  [59-77] 63 (08/14 0821) Resp:  [16-19] 17 (08/14 0821) BP: (118-157)/(59-70) 157/69 (08/14 0821) SpO2:  [93 %-98 %] 95 % (08/14 0821) General: Awake alert no distress comfortable eating breakfast in bed. HEENT: Normocephalic atraumatic Lungs: Clear Cardiovascular: Regular rhythm Abdomen soft nondistended nontender Extremities: Right leg in an immobilizer Neurological exam Awake alert oriented to self, place, month and date. Was not able to tell me the name of the current president-seem like he had a difficult time getting his name-said "Bidman, Bidwell, something like that - not Trump".  He also said that he is not very fond of politics and dismissed not be able to answer this question. Seem to have mildly reduced attention concentration No significant  dysarthria. No evidence of aphasia Cranial nerves: Pupils equal round react light, extraocular movements intact, visual fields full, face appears symmetric, tongue and palate midline. Motor exam: Both upper extremities are antigravity with symmetric 5/5 strength.  Right upper extremity has a noticeable tremor at rest which is worse than the left side. The tremor persists on reaching for objects and while performing tasks such as holding his milk carton and trying to drink it. Sensory exam: Intact to light touch Coordination: No obvious dysmetria but tremor as above. NIHSS 1a Level of Conscious.: 0 1b LOC Questions: 0 1c LOC Commands: 0 2 Best Gaze: 0 3 Visual: 0 4 Facial Palsy: 0 5a Motor Arm - left: 0 5b Motor Arm - Right: 0 6a Motor Leg - Left: 0 6b Motor Leg - Right: un (in cast) 7 Limb Ataxia: 0 8 Sensory: 0 9 Best Language: 0 10 Dysarthria: 0 11 Extinct. and Inatten.: 0 TOTAL: 0   Labs I have reviewed labs in epic and the results pertinent to this consultation are:   CBC    Component Value Date/Time   WBC  6.3 09/10/2019 0614   RBC 4.06 (L) 09/10/2019 0614   HGB 13.3 09/10/2019 0614   HGB 14.0 02/15/2014 2016   HCT 38.9 (L) 09/10/2019 0614   HCT 42.2 02/15/2014 2016   PLT 178 09/10/2019 0614   PLT 160 02/15/2014 2016   MCV 95.8 09/10/2019 0614   MCV 94 02/15/2014 2016   MCH 32.8 09/10/2019 0614   MCHC 34.2 09/10/2019 0614   RDW 11.9 09/10/2019 0614   RDW 13.2 02/15/2014 2016   LYMPHSABS 1.8 05/31/2019 0627   LYMPHSABS 1.1 01/22/2014 0538   MONOABS 1.8 (H) 05/31/2019 0627   MONOABS 1.1 (H) 01/22/2014 0538   EOSABS 0.1 05/31/2019 0627   EOSABS 0.0 01/22/2014 0538   BASOSABS 0.0 05/31/2019 0627   BASOSABS 0.0 01/22/2014 0538    CMP     Component Value Date/Time   NA 138 09/10/2019 0614   NA 143 02/15/2014 2016   K 4.1 09/10/2019 0614   K 4.3 02/15/2014 2016   CL 100 09/10/2019 0614   CL 110 (H) 02/15/2014 2016   CO2 30 09/10/2019 0614   CO2 28 02/15/2014 2016   GLUCOSE 99 09/10/2019 0614   GLUCOSE 103 (H) 02/15/2014 2016   BUN 12 09/10/2019 0614   BUN 17 02/15/2014 2016   CREATININE 0.76 09/10/2019 0614   CREATININE 1.09 02/15/2014 2016   CALCIUM 9.1 09/10/2019 0614   CALCIUM 8.9 02/15/2014 2016   PROT 7.2 09/07/2019 1445   PROT 6.8 02/15/2014 2016   ALBUMIN 4.5 09/07/2019 1445   ALBUMIN 3.7 02/15/2014 2016   AST 70 (H) 09/07/2019 1445   AST 76 (H) 02/15/2014 2016   ALT 21 09/07/2019 1445   ALT 58 02/15/2014 2016   ALKPHOS 56 09/07/2019 1445   ALKPHOS 71 02/15/2014 2016   BILITOT 1.3 (H) 09/07/2019 1445   BILITOT 0.2 02/15/2014 2016   GFRNONAA >60 09/10/2019 0614   GFRNONAA >60 02/15/2014 2016   GFRAA >60 09/10/2019 0614   GFRAA >60 02/15/2014 2016  Valproate level 11  Imaging I have reviewed the images obtained: MRI brain: Official reading consistent with 2 punctate areas of acute restricted diffusion which I think are merely artifactual.  I did call the on-call neuroradiologist who agrees that these areas are likely artifactual given the movement and noise  on those images.  Assessment:  74 year old man with a known history of strokes with no  residual deficit but post stroke seizures and history of seizure as a young adult, on Keppra and Lamictal-also on Depakote the dose of which is being reduced due to tremors, right hemibody parkinsonian features without a diagnosis of Parkinson's-seen for altered mental status concern for medication noncompliance and seizures. MRI imaging with concern for stroke. -The MRI finding is likely artifactual but given his risk factors, I would complete the stroke risk factor work-up. -As for the altered mental status: Likely multifactorial given progressive decline in his cognitive abilities to the point where the son had to take over his finances because of him missing credit card and bill payments etc.  Hypertensive emergency at the time of presentation also could have contributed to the altered mental status as well as the symptoms of double vision that he was complaining at the time.  Systolic blood pressure were 190s on presentation -Seizure disorder: Question compliance to medications.  Valproate level was 11-likely because the dose is being reduced with the plan to discontinue because of tremors.  Impression: -Seizure disorder-question medication noncompliance -Altered mental status-multifactorial toxic metabolic encephalopathy versus hypertensive emergency -Stroke on MRI: Likely artifactual but will complete stroke risk factor work-up -Progressive cognitive decline: Suspect underlying progressing dementia. -Tremors: Less likely parkinsonism, more likely essential tremor versus alcohol abuse related tremors  Recommendations:  For seizures: -I will reduce the Depakote to 250 mg twice daily. -I will continue Keppra 1500 mg twice daily -I will continue Lamictal 150 mg twice daily -Maintain seizure precautions -Can order routine EEG but I am unsure if that can be done over the weekend at this facility.  No urgent  need for EEG at this time.  No need to transfer for EEG at this point.  For altered mental status: Hypertensive emergency versus toxic metabolic encephalopathy -Gradually reduce blood pressure for goal blood pressure less than 140/90. -I will check ammonia levels -I will check TSH B12 levels -Thiamine is being supplemented in the hospital so I do not see a value in checking thiamine levels at this time.  For stroke risk factor work-up-MRI brain findings are likely artifactual and not likely to represent an acute stroke: -2D echocardiogram, A1c, lipid panel -Telemetry -Has allergy listed to iodinated contrast.  We will do MRA head and carotid Dopplers. -Continue Plavix -Home dose statin -PT OT speech therapy -No need for permissive hypertension-probably presented due to hypertensive emergency, see blood pressure recommendations above.  Tremors: -Propranolol 20 twice daily -Continue to work on alcohol cessation  Discussed my plan over the phone with the son. We will complete the stroke risk factor work-up although I feel the imaging findings are completely artifactual.  He will continue to need outpatient follow-up.  Plan relayed to primary hospitalist via secure text  -- Milon Dikes, MD [facility] Pager: [phone] If 7pm to 7am, please call on call as listed on AMION.  ADDENDUM Asked by Dr. Sunnie Nielsen about increased stroke risk due to stroke diagnosis. I do not think that imaging finding is real, and it rather just represents an artifact, so I do not believe that he is at any increased risk of stroke for that reason for any procdure.  -- Milon Dikes, MD [facility] Pager: [phone] If 7pm to 7am, please call on call as listed on AMION.

## 2019-09-10 NOTE — Progress Notes (Signed)
PROGRESS NOTE    Ian Gill  FUX:[id number] DOB: Aug 22, 1945 DOA: 09/08/2019 PCP: Barbette Reichmann, MD   Brief Narrative: 74 year old with past medical history significant for seizure, hypertension, hyperlipidemia, COPD, stroke, depression with anxiety, alcohol use who presents with altered mental status and seizures. Patient Recently discharged from skilled nursing facility to independent living facility after he had a right ankle fracture.  Day of admission patient was noted to be confused.  His son think patient probably had a seizure episode.  Patient had a fall and has bruises.   Evaluation in the ED patient valproic acid was subtherapeutic at 11.  18, blood pressure elevated 193/79.  Chest x-ray show vascular crowding with low lung volumes.  CT head negative for acute intracranial abnormalities.    Assessment & Plan:   Principal Problem:   Seizure (HCC) Active Problems:   Alcohol use   COPD (chronic obstructive pulmonary disease) (HCC)   Depression   Stroke (HCC)   HLD (hyperlipidemia)   GERD (gastroesophageal reflux disease)   HTN (hypertension)   Acute metabolic encephalopathy   Hypertensive urgency   Fall  1-Seizure and acute metabolic encephalopathy: -CT head negative for acute intracranial abnormality. -Patient was restarted on IV Keppra, Depakote and Lamictal -Patient is alert and oriented x3, per son patient has improved but not back to baseline. -MRI: Punctate left corona radiata DWI hyperintensities may reflect tiny subacute infarcts versus artifact. Remote small right frontal microhemorrhage. -neurology consulted, unlikely MRI finding are related to patient confusion and or stroke.  -Plan for EEG. Discussed with Neurology we dont have to wait for EEG results to proceed to sx.  -Depakote dose reduce.  -Ammonia level mildly elevated. Start low dose lactulose.  -per neurology component of Hypertensive encephalopathy   2-Alcohol use: Patient has been sober  for the last 6 weeks.  3-COPD: Continue with bronchodilators.  4-Depression: Continue with medications  5-History of stroke continue with Plavix and Lipitor.  6-GERD: Continue with Pepcid.   7-Hypertension: Continue with IV hydralazine as needed Continue with amlodipine lisinopril propanolol BP improving.   Fall: PT OT eval Ribs x ray; number 10 rib fracture Incentive hysterometry  Right ankle fracture: Discussed with podiatry, x-ray looks worse recommendation is for surgical intervention.  plan to proceed on Sunday Mildly displaced distal fibular fracture. PIP subluxation of the second toe ECHO; normal EF  Estimated body mass index is 32.08 kg/m as calculated from the following:   Height as of this encounter: 5\' 11"  (1.803 m).   Weight as of this encounter: 104.3 kg.   DVT prophylaxis: Lovenox Code Status: DNR Family Communication: Discussed with son who was at bedside Disposition Plan:  Status is: Inpatient  Remains inpatient appropriate because:Hemodynamically unstable   Dispo: The patient is from: Home              Anticipated d/c is to: SNF              Anticipated d/c date is: 3 days              Patient currently is not medically stable to d/c.  Plan to proceed with MRI, patient will require surgery for ankle fracture        Consultants:   Podiatry   Procedures:   none  Antimicrobials:    Subjective: Confusion on and off.  Alert and conversant   Objective: Vitals:   09/10/19 0427 09/10/19 0821 09/10/19 1051 09/10/19 1247  BP: (!) 148/64 (!) 157/69 (!) 148/75 (!) 121/57  Pulse: (!) 59 63 68 60  Resp: 16 17  17   Temp: 98 F (36.7 C) 98.2 F (36.8 C)  98.2 F (36.8 C)  TempSrc: Oral     SpO2: 98% 95%  91%  Weight:      Height:        Intake/Output Summary (Last 24 hours) at 09/10/2019 1448 Last data filed at 09/10/2019 1359 Gross per 24 hour  Intake 205.94 ml  Output 1100 ml  Net -894.06 ml   Filed Weights   09/07/19 1438    Weight: 104.3 kg    Examination:  General exam: NAD Respiratory system: CCTA Cardiovascular system: SS 1, S 2 RRR Gastrointestinal system: BS present, soft, nt Central nervous system: alert. oriented Extremities: boot in place Skin: Multiple skin bruises,  especially left chest   Data Reviewed: I have personally reviewed following labs and imaging studies  CBC: Recent Labs  Lab 09/06/19 0743 09/07/19 1445 09/09/19 0434 09/10/19 0614  WBC 5.4 6.8 5.9 6.3  HGB 14.3 14.6 13.0 13.3  HCT 40.7 42.0 35.9* 38.9*  MCV 94.9 94.8 93.5 95.8  PLT 173 187 181 178   Basic Metabolic Panel: Recent Labs  Lab 09/06/19 0743 09/07/19 1445 09/09/19 0434 09/10/19 0614  NA 137 140 136 138  K 4.2 4.1 3.7 4.1  CL 99 101 101 100  CO2 27 24 26 30   GLUCOSE 102* 116* 99 99  BUN 13 12 14 12   CREATININE 0.85 0.85 0.78 0.76  CALCIUM 9.4 9.6 8.7* 9.1   GFR: Estimated Creatinine Clearance: 98.1 mL/min (by C-G formula based on SCr of 0.76 mg/dL). Liver Function Tests: Recent Labs  Lab 09/07/19 1445  AST 70*  ALT 21  ALKPHOS 56  BILITOT 1.3*  PROT 7.2  ALBUMIN 4.5   No results for input(s): LIPASE, AMYLASE in the last 168 hours. Recent Labs  Lab 09/08/19 0429 09/10/19 1135  AMMONIA 18 44*   Coagulation Profile: No results for input(s): INR, PROTIME in the last 168 hours. Cardiac Enzymes: No results for input(s): CKTOTAL, CKMB, CKMBINDEX, TROPONINI in the last 168 hours. BNP (last 3 results) No results for input(s): PROBNP in the last 8760 hours. HbA1C: No results for input(s): HGBA1C in the last 72 hours. CBG: No results for input(s): GLUCAP in the last 168 hours. Lipid Profile: No results for input(s): CHOL, HDL, LDLCALC, TRIG, CHOLHDL, LDLDIRECT in the last 72 hours. Thyroid Function Tests: Recent Labs    09/10/19 1135  TSH 2.660   Anemia Panel: No results for input(s): VITAMINB12, FOLATE, FERRITIN, TIBC, IRON, RETICCTPCT in the last 72 hours. Sepsis Labs: No  results for input(s): PROCALCITON, LATICACIDVEN in the last 168 hours.  Recent Results (from the past 240 hour(s))  SARS Coronavirus 2 by RT PCR (hospital order, performed in [facility] hospital lab) Nasopharyngeal Nasopharyngeal Swab     Status: None   Collection Time: 09/08/19  7:36 AM   Specimen: Nasopharyngeal Swab  Result Value Ref Range Status   SARS Coronavirus 2 NEGATIVE NEGATIVE Final    Comment: (NOTE) SARS-CoV-2 target nucleic acids are NOT DETECTED.  The SARS-CoV-2 RNA is generally detectable in upper and lower respiratory specimens during the acute phase of infection. The lowest concentration of SARS-CoV-2 viral copies this assay can detect is 250 copies / mL. A negative result does not preclude SARS-CoV-2 infection and should not be used as the sole basis for treatment or other patient management decisions.  A negative result may occur with improper specimen collection /  handling, submission of specimen other than nasopharyngeal swab, presence of viral mutation(s) within the areas targeted by this assay, and inadequate number of viral copies (<250 copies / mL). A negative result must be combined with clinical observations, patient history, and epidemiological information.  Fact Sheet for Patients:   BoilerBrush.com.cy  Fact Sheet for Healthcare Providers: https://pope.com/  This test is not yet approved or  cleared by the [place] FDA and has been authorized for detection and/or diagnosis of SARS-CoV-2 by FDA under an Emergency Use Authorization (EUA).  This EUA will remain in effect (meaning this test can be used) for the duration of the COVID-19 declaration under Section 564(b)(1) of the Act, 21 U.S.C. section 360bbb-3(b)(1), unless the authorization is terminated or revoked sooner.  Performed at [facility], [address], [place], [place] [address]          Radiology Studies: DG Ribs  Unilateral Left  Result Date: 09/09/2019 CLINICAL DATA:  Frequent falls at home. History of seizures and Parkinson's disease. Pain under the left breast. EXAM: LEFT RIBS - 2 VIEW COMPARISON:  09/08/2019 FINDINGS: Acute, nondisplaced left posterior tenth rib fracture. No other acute fractures. No bone lesions. IMPRESSION: Nondisplaced, left posterior tenth rib fracture. Electronically Signed   By: Amie Portland M.D.   On: 09/09/2019 12:26   DG Ankle Complete Right  Result Date: 09/09/2019 CLINICAL DATA:  Larey Seat 2-3 weeks ago. Persistent pain and swelling. EXAM: RIGHT ANKLE - COMPLETE 3+ VIEW COMPARISON:  None. FINDINGS: There is a mildly displaced oblique fracture involving the distal fibula at and above the level of the ankle mortise. The tibia is intact. No fracture of the medial malleolus. Mild ankle joint degenerative changes. Ankle mortise is maintained. The mid and hindfoot bony structures are intact. Extensive soft tissue swelling mainly around the lateral aspect of the ankle. IMPRESSION: Mildly displaced distal fibular fracture. Electronically Signed   By: Rudie Meyer M.D.   On: 09/09/2019 11:14   MR ANGIO HEAD WO CONTRAST  Result Date: 09/10/2019 CLINICAL DATA:  Stroke, follow-up EXAM: MRA HEAD WITHOUT CONTRAST TECHNIQUE: Angiographic images of the Circle of Willis were obtained using MRA technique without intravenous contrast. COMPARISON:  None. FINDINGS: Motion artifact is present intracranial internal carotid arteries are patent. Middle and anterior cerebral arteries are patent. Intracranial vertebral arteries are partially included but patent. Basilar and posterior cerebral arteries are patent. Bilateral posterior communicating arteries are present. There is no significant stenosis or aneurysm. IMPRESSION: No proximal intracranial vessel occlusion or significant stenosis. Electronically Signed   By: Guadlupe Spanish M.D.   On: 09/10/2019 13:18   MR BRAIN WO CONTRAST  Result Date:  09/09/2019 CLINICAL DATA:  Delirium EXAM: MRI HEAD WITHOUT CONTRAST TECHNIQUE: Multiplanar, multiecho pulse sequences of the brain and surrounding structures were obtained without intravenous contrast. COMPARISON:  09/08/2019 head CT and prior.  01/19/1999 MRI head. FINDINGS: Please note that motion artifact limits evaluation. Brain: Few scattered T2 hyperintense foci involving the periventricular and subcortical white matter are nonspecific however commonly associated with chronic microvascular ischemic changes. Punctate left corona radiata hyperdense foci on DWI imaging (5:30, 31) may reflect tiny subacute infarcts versus artifact. Sequela of remote right frontal microhemorrhage. No midline shift, ventriculomegaly or extra-axial fluid collection. No mass lesion. Mild cerebral atrophy with ex vacuo dilatation. The bilateral hippocampi are symmetric demonstrating normal morphology and signal intensity. Vascular: Normal flow voids. Skull and upper cervical spine: Normal marrow signal. Sinuses/Orbits: Normal orbits. Minimal left maxillary sinus mucosal thickening. No mastoid effusion.  Other: None. IMPRESSION: Punctate left corona radiata DWI hyperintensities may reflect tiny subacute infarcts versus artifact. Remote small right frontal microhemorrhage. Mild cerebral atrophy. Minimal chronic microvascular ischemic changes. Normal appearance of the hippocampal complexes. Electronically Signed   By: Stana Bunting M.D.   On: 09/09/2019 15:14   DG Foot Complete Left  Result Date: 09/09/2019 CLINICAL DATA:  Frequent falls at home. History of Parkinson's disease and seizures. Has a right ankle fracture. Bruising around the second toe on the left. EXAM: LEFT FOOT - COMPLETE 3+ VIEW COMPARISON:  07/11/2015 FINDINGS: The middle phalanx of the left second toe is subluxed laterally in relation to the proximal phalanx, but non dislocated. There are small densities along the lateral margin of the second toe PIP joint  which may reflect small avulsion fracture fragments. There is no other evidence of a fracture. Remaining joints are normally aligned. There are degenerative changes most evident at the first metatarsophalangeal joint with joint space narrowing and small marginal osteophytes. Soft tissues are unremarkable. IMPRESSION: 1. Laterally subluxed PIP joint of the second toe with evidence suggesting small avulsion fractures, likely from the lateral distal corner of the proximal phalanx of the second toe. 2. No other evidence of a fracture or acute abnormality. 3. Moderate first metatarsophalangeal joint osteoarthritis. Electronically Signed   By: Amie Portland M.D.   On: 09/09/2019 12:30   ECHOCARDIOGRAM COMPLETE  Result Date: 09/10/2019    ECHOCARDIOGRAM REPORT   Patient Name:   SAVION WASHAM Date of Exam: 09/10/2019 Medical Rec #:  [id number]        Height:       71.0 in Accession #:    [id number]       Weight:       230.0 lb Date of Birth:  Feb 20, 1945        BSA:          2.238 m Patient Age:    74 years         BP:           148/64 mmHg Patient Gender: M                HR:           64 bpm. Exam Location:  [facility] Procedure: 2D Echo Indications:     Abnormal ECG R94.31  History:         Patient has no prior history of Echocardiogram examinations.  Sonographer:     Wonda Cerise RDCS Referring Phys:  [id number] Alba Cory Diagnosing Phys: Julien Nordmann MD  Sonographer Comments: Technically difficult study due to poor echo windows and suboptimal subcostal window. Image acquisition challenging due to patient body habitus. IMPRESSIONS  1. Left ventricular ejection fraction, by estimation, is 60 to 65%. The left ventricle has normal function. The left ventricle has no regional wall motion abnormalities. There is mild left ventricular hypertrophy. Left ventricular diastolic parameters are indeterminate.  2. Right ventricular systolic function is normal. The right ventricular size is normal. Tricuspid regurgitation signal  is inadequate for assessing PA pressure.  3. There is borderline dilatation of the aortic root measuring 37 mm. FINDINGS  Left Ventricle: Left ventricular ejection fraction, by estimation, is 60 to 65%. The left ventricle has normal function. The left ventricle has no regional wall motion abnormalities. The left ventricular internal cavity size was normal in size. There is  mild left ventricular hypertrophy. Left ventricular diastolic parameters are indeterminate. Right Ventricle: The right ventricular size is normal. No  increase in right ventricular wall thickness. Right ventricular systolic function is normal. Tricuspid regurgitation signal is inadequate for assessing PA pressure. Left Atrium: Left atrial size was normal in size. Right Atrium: Right atrial size was normal in size. Pericardium: There is no evidence of pericardial effusion. Mitral Valve: The mitral valve is normal in structure. Normal mobility of the mitral valve leaflets. No evidence of mitral valve regurgitation. No evidence of mitral valve stenosis. Tricuspid Valve: The tricuspid valve is normal in structure. Tricuspid valve regurgitation is not demonstrated. No evidence of tricuspid stenosis. Aortic Valve: The aortic valve was not well visualized. Aortic valve regurgitation is not visualized. No aortic stenosis is present. Aortic valve mean gradient measures 2.0 mmHg. Aortic valve peak gradient measures 5.4 mmHg. Aortic valve area, by VTI measures 3.00 cm. Pulmonic Valve: The pulmonic valve was normal in structure. Pulmonic valve regurgitation is not visualized. No evidence of pulmonic stenosis. Aorta: The aortic root is normal in size and structure. There is borderline dilatation of the aortic root measuring 37 mm. Venous: The inferior vena cava is normal in size with greater than 50% respiratory variability, suggesting right atrial pressure of 3 mmHg. IAS/Shunts: No atrial level shunt detected by color flow Doppler.  LEFT VENTRICLE PLAX 2D  LVIDd:         5.16 cm  Diastology LVIDs:         3.43 cm  LV e' lateral:   7.62 cm/s LV PW:         1.17 cm  LV E/e' lateral: 7.9 LV IVS:        1.23 cm  LV e' medial:    7.40 cm/s LVOT diam:     2.30 cm  LV E/e' medial:  8.1 LV SV:         66 LV SV Index:   29 LVOT Area:     4.15 cm  RIGHT VENTRICLE RV Basal diam:  2.87 cm RV S prime:     15.70 cm/s TAPSE (M-mode): 2.2 cm LEFT ATRIUM           Index       RIGHT ATRIUM           Index LA diam:      4.30 cm 1.92 cm/m  RA Area:     18.00 cm LA Vol (A2C): 36.9 ml 16.49 ml/m RA Volume:   55.70 ml  24.89 ml/m LA Vol (A4C): 48.6 ml 21.72 ml/m  AORTIC VALVE AV Area (Vmax):    2.60 cm AV Area (Vmean):   3.00 cm AV Area (VTI):     3.00 cm AV Vmax:           116.00 cm/s AV Vmean:          72.800 cm/s AV VTI:            0.219 m AV Peak Grad:      5.4 mmHg AV Mean Grad:      2.0 mmHg LVOT Vmax:         72.50 cm/s LVOT Vmean:        52.500 cm/s LVOT VTI:          0.158 m LVOT/AV VTI ratio: 0.72  AORTA Ao Root diam: 3.70 cm Ao Asc diam:  3.00 cm MITRAL VALVE               TRICUSPID VALVE MV Area (PHT): 2.54 cm    TV Peak grad:   21.3 mmHg MV Decel Time: 299  msec    TV Vmax:        2.31 m/s MV E velocity: 60.00 cm/s MV A velocity: 57.00 cm/s  SHUNTS MV E/A ratio:  1.05        Systemic VTI:  0.16 m                            Systemic Diam: 2.30 cm Julien Nordmannimothy Gollan MD Electronically signed by Julien Nordmannimothy Gollan MD Signature Date/Time: 09/10/2019/1:48:45 PM    Final         Scheduled Meds: .  stroke: mapping our early stages of recovery book   Does not apply Once  . amLODipine  10 mg Oral Daily  . atorvastatin  20 mg Oral Daily  . cholecalciferol  5,000 Units Oral Daily  . clopidogrel  75 mg Oral Daily  . divalproex  250 mg Oral Q12H  . enoxaparin (LOVENOX) injection  40 mg Subcutaneous Q24H  . folic acid  1 mg Oral Daily  . lactulose  20 g Oral Daily  . lamoTRIgine  150 mg Oral BID  . lisinopril  20 mg Oral Daily  . multivitamin with minerals  1 tablet Oral  Daily  . omega-3 acid ethyl esters  1,000 mg Oral Daily  . propranolol  20 mg Oral BID  . sertraline  200 mg Oral Daily  . thiamine  100 mg Oral Daily   Or  . thiamine  100 mg Intravenous Daily   Continuous Infusions: . levETIRAcetam 1,500 mg (09/10/19 1301)     LOS: 2 days    Time spent: 35 minutes.     Alba CoryBelkys A Hikaru Delorenzo, MD [facility]   If 7PM-7AM, please contact night-coverage [email or web address]  09/10/2019, 2:48 PM

## 2019-09-10 NOTE — Progress Notes (Signed)
*  PRELIMINARY RESULTS* Echocardiogram 2D Echocardiogram has been performed.  Garrel Ridgel Jaquisha Frech 09/10/2019, 9:09 AM

## 2019-09-11 ENCOUNTER — Inpatient Hospital Stay: Payer: Medicare Other

## 2019-09-11 ENCOUNTER — Encounter
Admission: EM | Disposition: A | Discharge status: Rehabilitation Facility | Payer: Self-pay | Source: Home / Self Care | Attending: Internal Medicine

## 2019-09-11 ENCOUNTER — Inpatient Hospital Stay: Payer: Medicare Other | Admitting: Anesthesiology

## 2019-09-11 DIAGNOSIS — R569 Unspecified convulsions: Secondary | ICD-10-CM | POA: Diagnosis not present

## 2019-09-11 DIAGNOSIS — J449 Chronic obstructive pulmonary disease, unspecified: Secondary | ICD-10-CM | POA: Diagnosis not present

## 2019-09-11 HISTORY — PX: ANKLE CLOSED REDUCTION: SHX880

## 2019-09-11 HISTORY — PX: ORIF ANKLE FRACTURE: SHX5408

## 2019-09-11 LAB — CBC
HCT: 38.7 % — ABNORMAL LOW (ref 39.0–52.0)
Hemoglobin: 13.8 g/dL (ref 13.0–17.0)
MCH: 33.3 pg (ref 26.0–34.0)
MCHC: 35.7 g/dL (ref 30.0–36.0)
MCV: 93.5 fL (ref 80.0–100.0)
Platelets: 197 10*3/uL (ref 150–400)
RBC: 4.14 MIL/uL — ABNORMAL LOW (ref 4.22–5.81)
RDW: 11.7 % (ref 11.5–15.5)
WBC: 5 10*3/uL (ref 4.0–10.5)
nRBC: 0 % (ref 0.0–0.2)

## 2019-09-11 LAB — BASIC METABOLIC PANEL
Anion gap: 11 (ref 5–15)
BUN: 10 mg/dL (ref 8–23)
CO2: 26 mmol/L (ref 22–32)
Calcium: 9.3 mg/dL (ref 8.9–10.3)
Chloride: 102 mmol/L (ref 98–111)
Creatinine, Ser: 0.74 mg/dL (ref 0.61–1.24)
GFR calc Af Amer: >60 mL/min (ref >60)
GFR calc non Af Amer: >60 mL/min (ref >60)
Glucose, Bld: 91 mg/dL (ref 70–99)
Potassium: 3.5 mmol/L (ref 3.5–5.1)
Sodium: 139 mmol/L (ref 135–145)

## 2019-09-11 LAB — GLUCOSE, CAPILLARY: Glucose-Capillary: 102 mg/dL — ABNORMAL HIGH (ref 70–99)

## 2019-09-11 LAB — LIPID PANEL
Cholesterol: 110 mg/dL (ref 0–200)
HDL: 37 mg/dL — ABNORMAL LOW (ref >40)
LDL Cholesterol: 59 mg/dL (ref 0–99)
Total CHOL/HDL Ratio: 3 RATIO
Triglycerides: 68 mg/dL (ref <150)
VLDL: 14 mg/dL (ref 0–40)

## 2019-09-11 LAB — HEMOGLOBIN A1C
Hgb A1c MFr Bld: 5.1 % (ref 4.8–5.6)
Mean Plasma Glucose: 99.67 mg/dL

## 2019-09-11 LAB — AMMONIA: Ammonia: 21 umol/L (ref 9–35)

## 2019-09-11 LAB — LEVETIRACETAM LEVEL: Levetiracetam Lvl: 5.5 ug/mL — ABNORMAL LOW (ref 10.0–40.0)

## 2019-09-11 SURGERY — OPEN REDUCTION INTERNAL FIXATION (ORIF) ANKLE FRACTURE
Anesthesia: General | Site: Foot | Laterality: Right

## 2019-09-11 MED ORDER — SUGAMMADEX SODIUM 200 MG/2ML IV SOLN
INTRAVENOUS | Status: DC | PRN
  Administered 2019-09-11: 200 mg via INTRAVENOUS

## 2019-09-11 MED ORDER — ACETAMINOPHEN 325 MG PO TABS: 325.0000 mg | ORAL_TABLET | ORAL | Status: DC | PRN

## 2019-09-11 MED ORDER — BUPIVACAINE LIPOSOME 1.3 % IJ SUSP
INTRAMUSCULAR | Status: DC | PRN
  Administered 2019-09-11 (×2): 10 mL

## 2019-09-11 MED ORDER — FENTANYL CITRATE (PF) 100 MCG/2ML IJ SOLN: 25.0000 ug | INTRAMUSCULAR | Status: DC | PRN

## 2019-09-11 MED ORDER — LACTATED RINGERS IV SOLN: INTRAVENOUS | Status: DC | PRN

## 2019-09-11 MED ORDER — TRANEXAMIC ACID 1000 MG/10ML IV SOLN
INTRAVENOUS | Status: AC
  Filled 2019-09-11: qty 10

## 2019-09-11 MED ORDER — DEXAMETHASONE SODIUM PHOSPHATE 10 MG/ML IJ SOLN
INTRAMUSCULAR | Status: DC | PRN
  Administered 2019-09-11: 5 mg via INTRAVENOUS

## 2019-09-11 MED ORDER — BUPIVACAINE HCL (PF) 0.25 % IJ SOLN
INTRAMUSCULAR | Status: DC | PRN
  Administered 2019-09-11: 10 mL

## 2019-09-11 MED ORDER — PHENYLEPHRINE HCL (PRESSORS) 10 MG/ML IV SOLN
INTRAVENOUS | Status: DC | PRN
  Administered 2019-09-11 (×2): 100 ug via INTRAVENOUS

## 2019-09-11 MED ORDER — ACETAMINOPHEN 160 MG/5ML PO SOLN
325.0000 mg | ORAL | Status: DC | PRN
  Filled 2019-09-11: qty 20.3

## 2019-09-11 MED ORDER — FENTANYL CITRATE (PF) 100 MCG/2ML IJ SOLN
INTRAMUSCULAR | Status: DC | PRN
  Administered 2019-09-11 (×2): 50 ug via INTRAVENOUS

## 2019-09-11 MED ORDER — CHLORHEXIDINE GLUCONATE 4 % EX LIQD: 60.0000 mL | Freq: Once | CUTANEOUS | Status: DC

## 2019-09-11 MED ORDER — PROMETHAZINE HCL 25 MG/ML IJ SOLN: 6.2500 mg | INTRAMUSCULAR | Status: DC | PRN

## 2019-09-11 MED ORDER — PROPOFOL 10 MG/ML IV BOLUS
INTRAVENOUS | Status: DC | PRN
  Administered 2019-09-11: 130 mg via INTRAVENOUS

## 2019-09-11 MED ORDER — HYDROCODONE-ACETAMINOPHEN 7.5-325 MG PO TABS
1.0000 | ORAL_TABLET | Freq: Once | ORAL | Status: DC | PRN
  Filled 2019-09-11: qty 1

## 2019-09-11 MED ORDER — BUPIVACAINE-EPINEPHRINE (PF) 0.5% -1:200000 IJ SOLN
INTRAMUSCULAR | Status: AC
  Filled 2019-09-11: qty 30

## 2019-09-11 MED ORDER — FENTANYL CITRATE (PF) 100 MCG/2ML IJ SOLN
INTRAMUSCULAR | Status: AC
  Filled 2019-09-11: qty 2

## 2019-09-11 MED ORDER — ONDANSETRON HCL 4 MG/2ML IJ SOLN
INTRAMUSCULAR | Status: DC | PRN
  Administered 2019-09-11: 4 mg via INTRAVENOUS

## 2019-09-11 MED ORDER — HYDROCODONE-ACETAMINOPHEN 5-325 MG PO TABS
1.0000 | ORAL_TABLET | Freq: Four times a day (QID) | ORAL | Status: DC | PRN
  Administered 2019-09-11 – 2019-09-12 (×2): 2 via ORAL
  Administered 2019-09-14: 1 via ORAL
  Filled 2019-09-11: qty 1
  Filled 2019-09-11 (×2): qty 2

## 2019-09-11 MED ORDER — ROCURONIUM BROMIDE 100 MG/10ML IV SOLN
INTRAVENOUS | Status: DC | PRN
  Administered 2019-09-11: 50 mg via INTRAVENOUS

## 2019-09-11 MED ORDER — PHENYLEPHRINE HCL-NACL 20-0.9 MG/250ML-% IV SOLN
INTRAVENOUS | Status: DC | PRN
  Administered 2019-09-11: 60 ug/min via INTRAVENOUS

## 2019-09-11 MED ORDER — SODIUM CHLORIDE FLUSH 0.9 % IV SOLN
INTRAVENOUS | Status: AC
  Filled 2019-09-11: qty 40

## 2019-09-11 MED ORDER — CEFAZOLIN SODIUM-DEXTROSE 2-4 GM/100ML-% IV SOLN
2.0000 g | INTRAVENOUS | Status: AC
  Administered 2019-09-11: 2 g via INTRAVENOUS
  Filled 2019-09-11: qty 100

## 2019-09-11 MED ORDER — LIDOCAINE HCL (CARDIAC) PF 100 MG/5ML IV SOSY
PREFILLED_SYRINGE | INTRAVENOUS | Status: DC | PRN
  Administered 2019-09-11: 100 mg via INTRAVENOUS

## 2019-09-11 MED ORDER — POVIDONE-IODINE 10 % EX SWAB: 2.0000 "application " | Freq: Once | CUTANEOUS | Status: DC

## 2019-09-11 MED ORDER — PROPOFOL 10 MG/ML IV BOLUS
INTRAVENOUS | Status: AC
  Filled 2019-09-11: qty 40

## 2019-09-11 SURGICAL SUPPLY — 60 items
BIT DRILL 2.5X2.75 QC CALB (BIT) ×1 IMPLANT
BIT DRILL CALIBRATED 2.7 (BIT) ×1 IMPLANT
BLADE SURG 15 STRL LF DISP TIS (BLADE) IMPLANT
BLADE SURG 15 STRL SS (BLADE)
BNDG CMPR STD VLCR NS LF 5.8X4 (GAUZE/BANDAGES/DRESSINGS) ×2
BNDG COHESIVE 4X5 TAN STRL (GAUZE/BANDAGES/DRESSINGS) ×4 IMPLANT
BNDG CONFORM 2 STRL LF (GAUZE/BANDAGES/DRESSINGS) ×3 IMPLANT
BNDG CONFORM 3 STRL LF (GAUZE/BANDAGES/DRESSINGS) ×3 IMPLANT
BNDG ELASTIC 4X5.8 VLCR NS LF (GAUZE/BANDAGES/DRESSINGS) ×3 IMPLANT
BNDG ESMARK 4X12 TAN STRL LF (GAUZE/BANDAGES/DRESSINGS) ×3 IMPLANT
BNDG GAUZE 4.5X4.1 6PLY STRL (MISCELLANEOUS) ×3 IMPLANT
BOOT STEPPER DURA MED (SOFTGOODS) ×1 IMPLANT
CANISTER SUCT 1200ML W/VALVE (MISCELLANEOUS) ×3 IMPLANT
COVER WAND RF STERILE (DRAPES) ×3 IMPLANT
CUFF TOURN SGL QUICK 18X4 (TOURNIQUET CUFF) ×1 IMPLANT
CUFF TOURN SGL QUICK 24 (TOURNIQUET CUFF)
CUFF TRNQT CYL 24X4X16.5-23 (TOURNIQUET CUFF) IMPLANT
DRAPE C-ARM XRAY 36X54 (DRAPES) ×3 IMPLANT
DRAPE C-ARMOR (DRAPES) ×3 IMPLANT
DRAPE FLUOR MINI C-ARM 54X84 (DRAPES) ×1 IMPLANT
DURAPREP 26ML APPLICATOR (WOUND CARE) ×3 IMPLANT
ELECT REM PT RETURN 9FT ADLT (ELECTROSURGICAL) ×3
ELECTRODE REM PT RTRN 9FT ADLT (ELECTROSURGICAL) ×2 IMPLANT
GAUZE SPONGE 4X4 12PLY STRL (GAUZE/BANDAGES/DRESSINGS) ×3 IMPLANT
GAUZE XEROFORM 1X8 LF (GAUZE/BANDAGES/DRESSINGS) ×3 IMPLANT
GLOVE BIO SURGEON STRL SZ7.5 (GLOVE) ×3 IMPLANT
GLOVE INDICATOR 8.0 STRL GRN (GLOVE) ×3 IMPLANT
GOWN STRL REUS W/ TWL XL LVL3 (GOWN DISPOSABLE) ×6 IMPLANT
GOWN STRL REUS W/TWL XL LVL3 (GOWN DISPOSABLE) ×9
GRAFT TRIN ELITE MED MUSC TRAN (Graft) ×1 IMPLANT
K-WIRE ACE 1.6X6 (WIRE) ×3
KIT TURNOVER KIT A (KITS) ×3 IMPLANT
KWIRE ACE 1.6X6 (WIRE) IMPLANT
LABEL OR SOLS (LABEL) ×3 IMPLANT
NDL SAFETY ECLIPSE 18X1.5 (NEEDLE) IMPLANT
NEEDLE HYPO 18GX1.5 SHARP (NEEDLE) ×3
NEEDLE HYPO 22GX1.5 SAFETY (NEEDLE) ×4 IMPLANT
NS IRRIG 500ML POUR BTL (IV SOLUTION) ×3 IMPLANT
PACK EXTREMITY (MISCELLANEOUS) ×3 IMPLANT
PAD PREP 24X41 OB/GYN DISP (PERSONAL CARE ITEMS) ×3 IMPLANT
PLATE LOCK 7H 92 BILAT FIB (Plate) ×1 IMPLANT
SCREW LOCK CORT STAR 3.5X10 (Screw) ×1 IMPLANT
SCREW LOCK CORT STAR 3.5X12 (Screw) ×1 IMPLANT
SCREW LOCK CORT STAR 3.5X14 (Screw) ×1 IMPLANT
SCREW LOW PROFILE 18MMX3.5MM (Screw) ×1 IMPLANT
SPLINT CAST 1 STEP 4X30 (MISCELLANEOUS) ×2 IMPLANT
SPLINT FAST PLASTER 5X30 (CAST SUPPLIES)
SPLINT PLASTER CAST FAST 5X30 (CAST SUPPLIES) ×2 IMPLANT
SPONGE LAP 18X18 RF (DISPOSABLE) ×3 IMPLANT
STAPLER SKIN PROX 35W (STAPLE) ×3 IMPLANT
STOCKINETTE M/LG 89821 (MISCELLANEOUS) ×3 IMPLANT
STRAP SAFETY 5IN WIDE (MISCELLANEOUS) ×3 IMPLANT
STRIP CLOSURE SKIN 1/2X4 (GAUZE/BANDAGES/DRESSINGS) IMPLANT
SUT VIC AB 2-0 CT1 27 (SUTURE) ×3
SUT VIC AB 2-0 CT1 TAPERPNT 27 (SUTURE) ×2 IMPLANT
SUT VIC AB 3-0 SH 27 (SUTURE) ×3
SUT VIC AB 3-0 SH 27X BRD (SUTURE) ×2 IMPLANT
SWABSTK COMLB BENZOIN TINCTURE (MISCELLANEOUS) IMPLANT
SYR 10ML LL (SYRINGE) ×5 IMPLANT
SYR 50ML LL SCALE MARK (SYRINGE) ×3 IMPLANT

## 2019-09-11 NOTE — Anesthesia Procedure Notes (Signed)
Procedure Name: Intubation Date/Time: 09/11/2019 8:38 AM Performed by: Clyde Lundborg, CRNA Pre-anesthesia Checklist: Patient identified, Emergency Drugs available, Suction available and Patient being monitored Patient Re-evaluated:Patient Re-evaluated prior to induction Oxygen Delivery Method: Circle system utilized Preoxygenation: Pre-oxygenation with 100% oxygen Induction Type: IV induction Ventilation: Mask ventilation without difficulty Laryngoscope Size: McGraph and 3 Tube type: Oral Tube size: 7.5 mm Number of attempts: 1 Airway Equipment and Method: Video-laryngoscopy Placement Confirmation: ETT inserted through vocal cords under direct vision,  positive ETCO2 and breath sounds checked- equal and bilateral Secured at: 21 cm Tube secured with: Tape Dental Injury: Teeth and Oropharynx as per pre-operative assessment

## 2019-09-11 NOTE — Op Note (Signed)
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  Dislocated second toe PIPJ    Postop diagnosis: Same    Procedure: 1.Open reduction with internal fixation with repair of nonunion distal fibula right ankle  2.  Closed reduction dislocated second toe PIPJ    EBL: Minimal    Anesthesia:local and general.  Local consisted of a 10 mL of 0.25% bupivacaine and 20 mL of Exparel long-acting anesthetic    Hemostasis: Mid calf tourniquet inflated to 200 mmHg for approximately 90 minutes    Specimen: None    Complications: Nonunion distal fibula    Operative indications:Sanford R Renner is an 74 y.o. that presents today for surgical intervention.  The risks/benefits/alternatives/complications have been discussed and consent has been given.    Procedure:  Patient was brought into the OR and placed on the operating table in thesupine position. After anesthesia was obtained attention was directed to the left second toe.  Fluoroscopy was used to showed lateral deviation of the second toe at the PIPJ.  At this time distal distraction was performed and the toe was placed in a more aligned position.  Post reduction fluoroscopy revealed anatomic alignment.  This was then buddy taped to the great toe with Coban.   Attention was then directed to theright lower extremity was prepped and draped in usual sterile fashion.  A lateral incision was performed along the fibula.  Sharp and blunt dissection carried down to the deeper fascial layer.  At this time the sural nerve was noted crossing across the fibula from posterior to anterior.  This was protected throughout the entire procedure.  Subperiosteal dissection revealed the nonunion site of the fibula.  This was initially debrided with a curette and rongeur down to healthy bleeding bone.  At this time the fracture site and nonunion site was packed with Trinity bone  graft.  Next the fracture was reduced in anatomic alignment.  A lateral Biomet fibular composite plate was placed.  2 distal screws were placed in the fibula locking.  The plate was compressed against the fibula with a nonlocking screw and the remaining screw holes were filled with locking screws.  One screw hole was left open at the site of the fibula fracture.  Anatomic alignment was noted.  No displacement was noted at this time.  The wound was flushed with copious amounts of irrigation.  Closure was performed with a 2-0 and 3-0 Vicryl the deeper and subcutaneous tissue and skin staples for skin.  A bulky sterile dressing was applied.  Patient was then placed into an equalizer walker boot.     Patient tolerated the procedure and anesthesia well.  Was transported from the OR to the PACU with all vital signs stable and vascular status intact. To be discharged per routine protocol.  Will follow up in approximately 1 week in the outpatient clinic.

## 2019-09-11 NOTE — H&P (Signed)
HISTORY AND PHYSICAL INTERVAL NOTE:  09/11/2019  8:06 AM  Ian Gill  has presented today for surgery, with the diagnosis of Right Ankle Fracture.  The various methods of treatment have been discussed with the patient.  No guarantees were given.  After consideration of risks, benefits and other options for treatment, the patient has consented to surgery.  I have reviewed the patients' chart and labs.     Echo performed yesterday and cleared for surgery.  The patient was reexamined.  There have been no changes to this history and physical examination.  Gwyneth Revels A

## 2019-09-11 NOTE — Transfer of Care (Signed)
Immediate Anesthesia Transfer of Care Note  Patient: Ian Gill  Procedure(s) Performed: OPEN REDUCTION INTERNAL FIXATION (ORIF) ANKLE FRACTURE (Right Ankle) CLOSED REDUCTION 2nd toe (Left Foot)  Patient Location: PACU  Anesthesia Type:General  Level of Consciousness: awake, alert  and oriented  Airway & Oxygen Therapy: Patient Spontanous Breathing and Patient connected to face mask oxygen  Post-op Assessment: Report given to RN and Post -op Vital signs reviewed and stable  Post vital signs: Reviewed and stable  Last Vitals:  Vitals Value Taken Time  BP    Temp    Pulse    Resp    SpO2      Last Pain:  Vitals:   09/11/19 0600  TempSrc:   PainSc: Asleep         Complications: No complications documented.

## 2019-09-11 NOTE — Progress Notes (Signed)
PROGRESS NOTE    Ian Gill  XKG:[id number] DOB: 1945/02/09 DOA: 09/08/2019 PCP: Barbette Reichmann, MD   Brief Narrative: 74 year old with past medical history significant for seizure, hypertension, hyperlipidemia, COPD, stroke, depression with anxiety, alcohol use who presents with altered mental status and seizures. Patient Recently discharged from skilled nursing facility to independent living facility after he had a right ankle fracture.  Day of admission patient was noted to be confused.  His son think patient probably had a seizure episode.  Patient had a fall and has bruises.   Evaluation in the ED patient valproic acid was subtherapeutic at 11.  18, blood pressure elevated 193/79.  Chest x-ray show vascular crowding with low lung volumes.  CT head negative for acute intracranial abnormalities.    Assessment & Plan:   Principal Problem:   Seizure (HCC) Active Problems:   Alcohol use   COPD (chronic obstructive pulmonary disease) (HCC)   Depression   Stroke (HCC)   HLD (hyperlipidemia)   GERD (gastroesophageal reflux disease)   HTN (hypertension)   Acute metabolic encephalopathy   Hypertensive urgency   Fall  1-Seizure and acute metabolic encephalopathy: -CT head negative for acute intracranial abnormality. -Patient was restarted on IV Keppra, Depakote and Lamictal -Patient is alert and oriented x3, per son patient has improved but not back to baseline. -MRI: Punctate left corona radiata DWI hyperintensities may reflect tiny subacute infarcts versus artifact. Remote small right frontal microhemorrhage. -neurology consulted, unlikely MRI finding are related to patient confusion and or stroke.  -Plan for EEG. Discussed with Neurology we dont have to wait for EEG results to proceed to sx.  -Depakote dose reduce.  -Ammonia level mildly elevated. Started  low dose lactulose. Follow level. LFT normal. Depakote dose decreased.  Ammonia down to normal level.  -per  neurology component of Hypertensive encephalopathy  -EEG if mental status fluctuates.   2-Alcohol use: Patient has been sober for the last 6 weeks.  3-COPD: Continue with bronchodilators.  4-Depression: Continue with medications  5-History of stroke continue with Plavix and Lipitor.  6-GERD: Continue with Pepcid.   7-Hypertension: Continue with IV hydralazine as needed Continue with amlodipine lisinopril propanolol BP improving.   Fall: PT OT eval Ribs x ray; number 10 rib fracture Incentive hysterometry Fell last night. CT head no acute abnormalities. Fall precaution.   Right ankle fracture: Discussed with podiatry, x-ray looks worse recommendation is for surgical intervention.  plan to proceed on Sunday Mildly displaced distal fibular fracture. 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  Close reduction dislocated second toe PIP J.   Estimated body mass index is 32.08 kg/m as calculated from the following:   Height as of this encounter: 5\' 11"  (1.803 m).   Weight as of this encounter: 104.3 kg.   DVT prophylaxis: Lovenox Code Status: DNR Family Communication: Discussed with son who was at bedside Disposition Plan:  Status is: Inpatient  Remains inpatient appropriate because:Hemodynamically unstable   Dispo: The patient is from: Home              Anticipated d/c is to: SNF              Anticipated d/c date is: 3 days              Patient currently is not medically stable to d/c.  Plan to proceed with MRI, patient will require surgery for ankle fracture  Consultants:   Podiatry   Procedures:   none  Antimicrobials:    Subjective: He is alert, just came from OR.  Denies pain. Oriented to place   Objective: Vitals:   09/11/19 1110 09/11/19 1115 09/11/19 1120 09/11/19 1156  BP:  115/63 101/64 (!) 118/50  Pulse: (!) 58 60 (!) 58 61    Resp: 15 14 (!) 22 16  Temp: 97.7 F (36.5 C)   98.3 F (36.8 C)  TempSrc:    Oral  SpO2: 92% 91% 91% 91%  Weight:      Height:        Intake/Output Summary (Last 24 hours) at 09/11/2019 1233 Last data filed at 09/11/2019 1115 Gross per 24 hour  Intake 1047.97 ml  Output 1505 ml  Net -457.03 ml   Filed Weights   09/07/19 1438  Weight: 104.3 kg    Examination:  General exam: NAD Respiratory system: CTA Cardiovascular system: S 1, S 2 RRR Gastrointestinal system: BS present, soft, Nt Central nervous system: Alert and oriented  Extremities: RLE immobilizer.   Data Reviewed: I have personally reviewed following labs and imaging studies  CBC: Recent Labs  Lab 09/06/19 0743 09/07/19 1445 09/09/19 0434 09/10/19 0614 09/11/19 0730  WBC 5.4 6.8 5.9 6.3 5.0  HGB 14.3 14.6 13.0 13.3 13.8  HCT 40.7 42.0 35.9* 38.9* 38.7*  MCV 94.9 94.8 93.5 95.8 93.5  PLT 173 187 181 178 197   Basic Metabolic Panel: Recent Labs  Lab 09/06/19 0743 09/07/19 1445 09/09/19 0434 09/10/19 0614 09/11/19 0730  NA 137 140 136 138 139  K 4.2 4.1 3.7 4.1 3.5  CL 99 101 101 100 102  CO2 27 24 26 30 26   GLUCOSE 102* 116* 99 99 91  BUN 13 12 14 12 10   CREATININE 0.85 0.85 0.78 0.76 0.74  CALCIUM 9.4 9.6 8.7* 9.1 9.3   GFR: Estimated Creatinine Clearance: 98.1 mL/min (by C-G formula based on SCr of 0.74 mg/dL). Liver Function Tests: Recent Labs  Lab 09/07/19 1445  AST 70*  ALT 21  ALKPHOS 56  BILITOT 1.3*  PROT 7.2  ALBUMIN 4.5   No results for input(s): LIPASE, AMYLASE in the last 168 hours. Recent Labs  Lab 09/08/19 0429 09/10/19 1135 09/11/19 0730  AMMONIA 18 44* 21   Coagulation Profile: No results for input(s): INR, PROTIME in the last 168 hours. Cardiac Enzymes: No results for input(s): CKTOTAL, CKMB, CKMBINDEX, TROPONINI in the last 168 hours. BNP (last 3 results) No results for input(s): PROBNP in the last 8760 hours. HbA1C: No results for input(s): HGBA1C in  the last 72 hours. CBG: No results for input(s): GLUCAP in the last 168 hours. Lipid Profile: Recent Labs    09/11/19 0730  CHOL 110  HDL 37*  LDLCALC 59  TRIG 68  CHOLHDL 3.0   Thyroid Function Tests: Recent Labs    09/10/19 1135  TSH 2.660   Anemia Panel: Recent Labs    09/10/19 1135  VITAMINB12 614   Sepsis Labs: No results for input(s): PROCALCITON, LATICACIDVEN in the last 168 hours.  Recent Results (from the past 240 hour(s))  SARS Coronavirus 2 by RT PCR (hospital order, [facility]) Nasopharyngeal Nasopharyngeal Swab     Status: None   Collection Time: 09/08/19  7:36 AM   Specimen: Nasopharyngeal Swab  Result Value Ref Range Status   SARS Coronavirus 2 NEGATIVE NEGATIVE Final    Comment: (NOTE) SARS-CoV-2 target nucleic acids are NOT DETECTED.  The SARS-CoV-2 RNA is generally detectable in upper and lower respiratory specimens during the acute phase of infection. The lowest concentration of SARS-CoV-2 viral copies this assay can detect is 250 copies / mL. A negative result does not preclude SARS-CoV-2 infection and should not be used as the sole basis for treatment or other patient management decisions.  A negative result may occur with improper specimen collection / handling, submission of specimen other than nasopharyngeal swab, presence of viral mutation(s) within the areas targeted by this assay, and inadequate number of viral copies (<250 copies / mL). A negative result must be combined with clinical observations, patient history, and epidemiological information.  Fact Sheet for Patients:   BoilerBrush.com.cy  Fact Sheet for Healthcare Providers: https://pope.com/  This test is not yet approved or  cleared by the [place] FDA and has been authorized for detection and/or diagnosis of SARS-CoV-2 by FDA under an Emergency Use Authorization (EUA).  This EUA will remain in  effect (meaning this test can be used) for the duration of the COVID-19 declaration under Section 564(b)(1) of the Act, 21 U.S.C. section 360bbb-3(b)(1), unless the authorization is terminated or revoked sooner.  Performed at [facility], [address], [place], [place] [address]          Radiology Studies: CT HEAD WO CONTRAST  Result Date: 09/10/2019 CLINICAL DATA:  Altered mental status and seizures. EXAM: CT HEAD WITHOUT CONTRAST TECHNIQUE: Contiguous axial images were obtained from the base of the skull through the vertex without intravenous contrast. COMPARISON:  September 08, 2019 FINDINGS: Brain: There is mild cerebral atrophy with widening of the extra-axial spaces and ventricular dilatation. There are areas of decreased attenuation within the white matter tracts of the supratentorial brain, consistent with microvascular disease changes. Vascular: No hyperdense vessel or unexpected calcification. Skull: Normal. Negative for fracture or focal lesion. Sinuses/Orbits: No acute finding. Other: None. IMPRESSION: 1. Generalized cerebral atrophy. 2. No acute intracranial abnormality. Electronically Signed   By: Aram Candela M.D.   On: 09/10/2019 21:43   MR ANGIO HEAD WO CONTRAST  Result Date: 09/10/2019 CLINICAL DATA:  Stroke, follow-up EXAM: MRA HEAD WITHOUT CONTRAST TECHNIQUE: Angiographic images of the Circle of Willis were obtained using MRA technique without intravenous contrast. COMPARISON:  None. FINDINGS: Motion artifact is present intracranial internal carotid arteries are patent. Middle and anterior cerebral arteries are patent. Intracranial vertebral arteries are partially included but patent. Basilar and posterior cerebral arteries are patent. Bilateral posterior communicating arteries are present. There is no significant stenosis or aneurysm. IMPRESSION: No proximal intracranial vessel occlusion or significant stenosis. Electronically Signed   By: Guadlupe Spanish M.D.    On: 09/10/2019 13:18   MR BRAIN WO CONTRAST  Result Date: 09/09/2019 CLINICAL DATA:  Delirium EXAM: MRI HEAD WITHOUT CONTRAST TECHNIQUE: Multiplanar, multiecho pulse sequences of the brain and surrounding structures were obtained without intravenous contrast. COMPARISON:  09/08/2019 head CT and prior.  01/19/1999 MRI head. FINDINGS: Please note that motion artifact limits evaluation. Brain: Few scattered T2 hyperintense foci involving the periventricular and subcortical white matter are nonspecific however commonly associated with chronic microvascular ischemic changes. Punctate left corona radiata hyperdense foci on DWI imaging (5:30, 31) may reflect tiny subacute infarcts versus artifact. Sequela of remote right frontal microhemorrhage. No midline shift, ventriculomegaly or extra-axial fluid collection. No mass lesion. Mild cerebral atrophy with ex vacuo dilatation. The bilateral hippocampi are symmetric demonstrating normal morphology and signal intensity. Vascular: Normal flow voids. Skull and upper cervical spine: Normal marrow signal.  Sinuses/Orbits: Normal orbits. Minimal left maxillary sinus mucosal thickening. No mastoid effusion. Other: None. IMPRESSION: Punctate left corona radiata DWI hyperintensities may reflect tiny subacute infarcts versus artifact. Remote small right frontal microhemorrhage. Mild cerebral atrophy. Minimal chronic microvascular ischemic changes. Normal appearance of the hippocampal complexes. Electronically Signed   By: Stana Bunting M.D.   On: 09/09/2019 15:14   US Carotid Bilateral  Result Date: 09/10/2019 CLINICAL DATA:  74 year old male with a history of stroke EXAM: BILATERAL CAROTID DUPLEX ULTRASOUND TECHNIQUE: Wallace Cullens scale imaging, color Doppler and duplex ultrasound were performed of bilateral carotid and vertebral arteries in the neck. COMPARISON:  None. FINDINGS: Criteria: Quantification of carotid stenosis is based on velocity parameters that correlate the  residual internal carotid diameter with NASCET-based stenosis levels, using the diameter of the distal internal carotid lumen as the denominator for stenosis measurement. The following velocity measurements were obtained: RIGHT ICA:  Systolic 83 cm/sec, Diastolic 23 cm/sec CCA:  65 cm/sec SYSTOLIC ICA/CCA RATIO:  1.3 ECA:  112 cm/sec LEFT ICA:  Systolic 91 cm/sec, Diastolic 27 cm/sec CCA:  113 cm/sec SYSTOLIC ICA/CCA RATIO:  0.8 ECA:  96 cm/sec Right Brachial SBP: Not acquired Left Brachial SBP: Not acquired RIGHT CAROTID ARTERY: No significant calcifications of the right common carotid artery. Intermediate waveform maintained. Heterogeneous and partially calcified plaque at the right carotid bifurcation. No significant lumen shadowing. Low resistance waveform of the right ICA. No significant tortuosity. RIGHT VERTEBRAL ARTERY: Antegrade flow with low resistance waveform. LEFT CAROTID ARTERY: No significant calcifications of the left common carotid artery. Intermediate waveform maintained. Heterogeneous and partially calcified plaque at the left carotid bifurcation without significant lumen shadowing. Low resistance waveform of the left ICA. No significant tortuosity. LEFT VERTEBRAL ARTERY:  Antegrade flow with low resistance waveform. IMPRESSION: Color duplex indicates minimal heterogeneous and calcified plaque, with no hemodynamically significant stenosis by duplex criteria in the extracranial cerebrovascular circulation. Signed, Yvone Neu. Reyne Dumas, RPVI [facility] Electronically Signed   By: Gilmer Mor D.O.   On: 09/10/2019 15:09   DG MINI C-ARM IMAGE ONLY  Result Date: 09/11/2019 There is no interpretation for this exam.  This order is for images obtained during a surgical procedure.  Please See "Surgeries" Tab for more information regarding the procedure.   ECHOCARDIOGRAM COMPLETE  Result Date: 09/10/2019    ECHOCARDIOGRAM REPORT   Patient  Name:   Ian Gill Date of Exam: 09/10/2019 Medical Rec #:  [id number]        Height:       71.0 in Accession #:    [id number]       Weight:       230.0 lb Date of Birth:  October 18, 1945        BSA:          2.238 m Patient Age:    74 years         BP:           148/64 mmHg Patient Gender: M                HR:           64 bpm. Exam Location:  [facility] Procedure: 2D Echo Indications:     Abnormal ECG R94.31  History:         Patient has no prior history of Echocardiogram examinations.  Sonographer:     Wonda Cerise RDCS Referring Phys:  [id number] Alba Cory Diagnosing Phys: Julien Nordmann MD  Sonographer  Comments: Technically difficult study due to poor echo windows and suboptimal subcostal window. Image acquisition challenging due to patient body habitus. IMPRESSIONS  1. Left ventricular ejection fraction, by estimation, is 60 to 65%. The left ventricle has normal function. The left ventricle has no regional wall motion abnormalities. There is mild left ventricular hypertrophy. Left ventricular diastolic parameters are indeterminate.  2. Right ventricular systolic function is normal. The right ventricular size is normal. Tricuspid regurgitation signal is inadequate for assessing PA pressure.  3. There is borderline dilatation of the aortic root measuring 37 mm. FINDINGS  Left Ventricle: Left ventricular ejection fraction, by estimation, is 60 to 65%. The left ventricle has normal function. The left ventricle has no regional wall motion abnormalities. The left ventricular internal cavity size was normal in size. There is  mild left ventricular hypertrophy. Left ventricular diastolic parameters are indeterminate. Right Ventricle: The right ventricular size is normal. No increase in right ventricular wall thickness. Right ventricular systolic function is normal. Tricuspid regurgitation signal is inadequate for assessing PA pressure. Left Atrium: Left atrial size was normal in size. Right Atrium: Right atrial size was  normal in size. Pericardium: There is no evidence of pericardial effusion. Mitral Valve: The mitral valve is normal in structure. Normal mobility of the mitral valve leaflets. No evidence of mitral valve regurgitation. No evidence of mitral valve stenosis. Tricuspid Valve: The tricuspid valve is normal in structure. Tricuspid valve regurgitation is not demonstrated. No evidence of tricuspid stenosis. Aortic Valve: The aortic valve was not well visualized. Aortic valve regurgitation is not visualized. No aortic stenosis is present. Aortic valve mean gradient measures 2.0 mmHg. Aortic valve peak gradient measures 5.4 mmHg. Aortic valve area, by VTI measures 3.00 cm. Pulmonic Valve: The pulmonic valve was normal in structure. Pulmonic valve regurgitation is not visualized. No evidence of pulmonic stenosis. Aorta: The aortic root is normal in size and structure. There is borderline dilatation of the aortic root measuring 37 mm. Venous: The inferior vena cava is normal in size with greater than 50% respiratory variability, suggesting right atrial pressure of 3 mmHg. IAS/Shunts: No atrial level shunt detected by color flow Doppler.  LEFT VENTRICLE PLAX 2D LVIDd:         5.16 cm  Diastology LVIDs:         3.43 cm  LV e' lateral:   7.62 cm/s LV PW:         1.17 cm  LV E/e' lateral: 7.9 LV IVS:        1.23 cm  LV e' medial:    7.40 cm/s LVOT diam:     2.30 cm  LV E/e' medial:  8.1 LV SV:         66 LV SV Index:   29 LVOT Area:     4.15 cm  RIGHT VENTRICLE RV Basal diam:  2.87 cm RV S prime:     15.70 cm/s TAPSE (M-mode): 2.2 cm LEFT ATRIUM           Index       RIGHT ATRIUM           Index LA diam:      4.30 cm 1.92 cm/m  RA Area:     18.00 cm LA Vol (A2C): 36.9 ml 16.49 ml/m RA Volume:   55.70 ml  24.89 ml/m LA Vol (A4C): 48.6 ml 21.72 ml/m  AORTIC VALVE AV Area (Vmax):    2.60 cm AV Area (Vmean):   3.00 cm AV Area (VTI):  3.00 cm AV Vmax:           116.00 cm/s AV Vmean:          72.800 cm/s AV VTI:             0.219 m AV Peak Grad:      5.4 mmHg AV Mean Grad:      2.0 mmHg LVOT Vmax:         72.50 cm/s LVOT Vmean:        52.500 cm/s LVOT VTI:          0.158 m LVOT/AV VTI ratio: 0.72  AORTA Ao Root diam: 3.70 cm Ao Asc diam:  3.00 cm MITRAL VALVE               TRICUSPID VALVE MV Area (PHT): 2.54 cm    TV Peak grad:   21.3 mmHg MV Decel Time: 299 msec    TV Vmax:        2.31 m/s MV E velocity: 60.00 cm/s MV A velocity: 57.00 cm/s  SHUNTS MV E/A ratio:  1.05        Systemic VTI:  0.16 m                            Systemic Diam: 2.30 cm Julien Nordmann MD Electronically signed by Julien Nordmann MD Signature Date/Time: 09/10/2019/1:48:45 PM    Final         Scheduled Meds: .  stroke: mapping our early stages of recovery book   Does not apply Once  . amLODipine  10 mg Oral Daily  . atorvastatin  20 mg Oral Daily  . cholecalciferol  5,000 Units Oral Daily  . clopidogrel  75 mg Oral Daily  . divalproex  250 mg Oral Q12H  . enoxaparin (LOVENOX) injection  40 mg Subcutaneous Q24H  . folic acid  1 mg Oral Daily  . lactulose  20 g Oral Daily  . lamoTRIgine  150 mg Oral BID  . lisinopril  20 mg Oral Daily  . multivitamin with minerals  1 tablet Oral Daily  . omega-3 acid ethyl esters  1,000 mg Oral Daily  . propranolol  20 mg Oral BID  . sertraline  200 mg Oral Daily  . thiamine  100 mg Oral Daily   Or  . thiamine  100 mg Intravenous Daily   Continuous Infusions: . levETIRAcetam 1,500 mg (09/10/19 2316)     LOS: 3 days    Time spent: 35 minutes.     Alba Cory, MD [facility]   If 7PM-7AM, please contact night-coverage [email or web address]  09/11/2019, 12:33 PM

## 2019-09-11 NOTE — Progress Notes (Addendum)
Neurology Progress Note   S:// Patient seen and examined soon after his return from surgery.  Reports still feeling a little groggy from the anesthesia   O:// Current vital signs: BP (!) 118/50 (BP Location: Right Arm)   Pulse 61   Temp 98.3 F (36.8 C) (Oral)   Resp 16   Ht 5\' 11"  (1.803 m)   Wt 104.3 kg   SpO2 91%   BMI 32.08 kg/m  Vital signs in last 24 hours: Temp:  [97.1 F (36.2 C)-98.6 F (37 C)] 98.3 F (36.8 C) (08/15 1156) Pulse Rate:  [50-69] 61 (08/15 1156) Resp:  [9-22] 16 (08/15 1156) BP: (101-153)/(50-72) 118/50 (08/15 1156) SpO2:  [90 %-96 %] 91 % (08/15 1156) Somewhat limited due to patient just being post procedure recovering from anesthesia Somewhat somnolent but is able to have full conversation. No dysarthria No aphasia Oriented to place person and time. Cranial: Pupils equal round react light, extraocular movements intact, visual fields full, facial sensation intact, face symmetric, auditory acuity intact, tongue and palate midline. Motor exam: Both upper extremities antigravity symmetric 5/5 strength with the right upper extremity continues to have noticeable tremor at rest.  Mild resting tremor on the left upper extremity as well. Sensory exam: Intact light touch Coordination No obvious dysmetria   Medications  Current Facility-Administered Medications:  .   stroke: mapping our early stages of recovery book, , Does not apply, Once, 09-24-1984, DPM .  acetaminophen (TYLENOL) tablet 650 mg, 650 mg, Oral, Q6H PRN **OR** acetaminophen (TYLENOL) suppository 650 mg, 650 mg, Rectal, Q6H PRN, Gwyneth Revels, DPM .  amLODipine (NORVASC) tablet 10 mg, 10 mg, Oral, Daily, Gwyneth Revels, DPM, 10 mg at 09/10/19 1047 .  atorvastatin (LIPITOR) tablet 20 mg, 20 mg, Oral, Daily, 09/12/19, DPM, 20 mg at 09/10/19 1047 .  cholecalciferol (VITAMIN D3) tablet 5,000 Units, 5,000 Units, Oral, Daily, 09/12/19, DPM, 5,000 Units at 09/10/19 1046 .   clopidogrel (PLAVIX) tablet 75 mg, 75 mg, Oral, Daily, 09/12/19, DPM, 75 mg at 09/10/19 1047 .  divalproex (DEPAKOTE SPRINKLE) capsule 250 mg, 250 mg, Oral, Q12H, 09/12/19, DPM, 250 mg at 09/10/19 2339 .  enoxaparin (LOVENOX) injection 40 mg, 40 mg, Subcutaneous, Q24H, 2340, DPM, 40 mg at 09/10/19 2340 .  folic acid (FOLVITE) tablet 1 mg, 1 mg, Oral, Daily, 09/12/19, DPM, 1 mg at 09/10/19 1648 .  hydrALAZINE (APRESOLINE) injection 5 mg, 5 mg, Intravenous, Q2H PRN, 09/12/19, DPM .  HYDROcodone-acetaminophen (NORCO/VICODIN) 5-325 MG per tablet 1-2 tablet, 1-2 tablet, Oral, Q6H PRN, Gwyneth Revels, DPM .  lactulose (CHRONULAC) 10 GM/15ML solution 20 g, 20 g, Oral, Daily, Gwyneth Revels, DPM, 20 g at 09/10/19 1648 .  lamoTRIgine (LAMICTAL) tablet 150 mg, 150 mg, Oral, BID, 09/12/19, DPM, 150 mg at 09/10/19 2338 .  levETIRAcetam (KEPPRA) IVPB 1500 mg/ 100 mL premix, 1,500 mg, Intravenous, Q12H, 2339, DPM, Last Rate: 400 mL/hr at 09/10/19 2316, 1,500 mg at 09/10/19 2316 .  lisinopril (ZESTRIL) tablet 20 mg, 20 mg, Oral, Daily, 09/12/19, DPM, 20 mg at 09/10/19 1047 .  multivitamin with minerals tablet 1 tablet, 1 tablet, Oral, Daily, 09/12/19, DPM, 1 tablet at 09/10/19 1046 .  omega-3 acid ethyl esters (LOVAZA) capsule 1,000 mg, 1,000 mg, Oral, Daily, 09/12/19, DPM, 1,000 mg at 09/10/19 1047 .  ondansetron (ZOFRAN) injection 4 mg, 4 mg, Intravenous, Q8H PRN, 09/12/19, DPM .  propranolol (INDERAL) tablet 20 mg, 20 mg, Oral, BID,  Gwyneth Revels, DPM, 20 mg at 09/11/19 0016 .  sertraline (ZOLOFT) tablet 200 mg, 200 mg, Oral, Daily, Gwyneth Revels, DPM, 200 mg at 09/10/19 1046 .  thiamine tablet 100 mg, 100 mg, Oral, Daily, 100 mg at 09/09/19 1035 **OR** thiamine (B-1) injection 100 mg, 100 mg, Intravenous, Daily, Gwyneth Revels, DPM, 100 mg at 09/10/19 1045 Labs CBC    Component Value Date/Time   WBC 5.0 09/11/2019 0730   RBC 4.14  (L) 09/11/2019 0730   HGB 13.8 09/11/2019 0730   HGB 14.0 02/15/2014 2016   HCT 38.7 (L) 09/11/2019 0730   HCT 42.2 02/15/2014 2016   PLT 197 09/11/2019 0730   PLT 160 02/15/2014 2016   MCV 93.5 09/11/2019 0730   MCV 94 02/15/2014 2016   MCH 33.3 09/11/2019 0730   MCHC 35.7 09/11/2019 0730   RDW 11.7 09/11/2019 0730   RDW 13.2 02/15/2014 2016   LYMPHSABS 1.8 05/31/2019 0627   LYMPHSABS 1.1 01/22/2014 0538   MONOABS 1.8 (H) 05/31/2019 0627   MONOABS 1.1 (H) 01/22/2014 0538   EOSABS 0.1 05/31/2019 0627   EOSABS 0.0 01/22/2014 0538   BASOSABS 0.0 05/31/2019 0627   BASOSABS 0.0 01/22/2014 0538    CMP     Component Value Date/Time   NA 139 09/11/2019 0730   NA 143 02/15/2014 2016   K 3.5 09/11/2019 0730   K 4.3 02/15/2014 2016   CL 102 09/11/2019 0730   CL 110 (H) 02/15/2014 2016   CO2 26 09/11/2019 0730   CO2 28 02/15/2014 2016   GLUCOSE 91 09/11/2019 0730   GLUCOSE 103 (H) 02/15/2014 2016   BUN 10 09/11/2019 0730   BUN 17 02/15/2014 2016   CREATININE 0.74 09/11/2019 0730   CREATININE 1.09 02/15/2014 2016   CALCIUM 9.3 09/11/2019 0730   CALCIUM 8.9 02/15/2014 2016   PROT 7.2 09/07/2019 1445   PROT 6.8 02/15/2014 2016   ALBUMIN 4.5 09/07/2019 1445   ALBUMIN 3.7 02/15/2014 2016   AST 70 (H) 09/07/2019 1445   AST 76 (H) 02/15/2014 2016   ALT 21 09/07/2019 1445   ALT 58 02/15/2014 2016   ALKPHOS 56 09/07/2019 1445   ALKPHOS 71 02/15/2014 2016   BILITOT 1.3 (H) 09/07/2019 1445   BILITOT 0.2 02/15/2014 2016   GFRNONAA >60 09/11/2019 0730   GFRNONAA >60 02/15/2014 2016   GFRAA >60 09/11/2019 0730   GFRAA >60 02/15/2014 2016  B12 and TSH normal Ammonia elevated at 44  Imaging I have reviewed images in epic and the results pertinent to this consultation are: No new imaging MRI brain from yesterday show 2 questionable areas of restricted diffusion which I think are merely artifactual and the on-call neuroradiologist that I discussed with also agrees with my  assessment of this being artifactual. Mild cerebral atrophy.  Mild chronic microvascular disease.  2D echo 1. Left ventricular ejection fraction, by estimation, is 60 to 65%. The  left ventricle has normal function. The left ventricle has no regional  wall motion abnormalities. There is mild left ventricular hypertrophy.  Left ventricular diastolic parameters  are indeterminate.  2. Right ventricular systolic function is normal. The right ventricular  size is normal. Tricuspid regurgitation signal is inadequate for assessing  PA pressure.  3. There is borderline dilatation of the aortic root measuring 37 mm.  LA size normal No interatrial shunt  Assessment:  74 year old man with known history of strokes with no residual deficits but post stroke seizures, history of seizures as a young adult, on  Keppra and Lamictal-also on Depakote the dose of which is being reduced possibly due to underlying tremors, right hemibody parkinsonian features without the diagnosis of Parkinson's, seen for altered mental status and concern for medication noncompliance and seizures.  MRI imaging had some concerning findings for stroke. -I think the MRI findings are artifactual but given his risk factors stroke work-up has been completed. -Altered mental status-likely multifactorial with breakthrough seizures due to noncompliance could be contributing factor but most likely due to hypertensive emergency that he presented with a more likely cause for the altered mental status.  Impression: -Seizure disorder-question medication noncompliance -Altered mental status-multifactorial toxic metabolic encephalopathy versus hypertensive emergency -Questionable acute stroke on MRI: Likely artifactual-completed stroke work-up. -Progressive cognitive decline: Suspect underlying progressing dementia. -Tremors: Less likely parkinsonism, more likely essential tremor versus alcohol abuse related  tremors  Recommendations: -Continue Depakote at reduced dose of 250 mg twice daily -Continue Keppra 1500 mg twice daily -Continue Lamictal at 150 mg twice daily -Maintain seizure precautions -Can obtain an EEG if desired but has continuing waxing and waning mental status but have not ordered due to EEG facilities not being available over the weekend. -Management of hypertensive emergency per primary team.  Goal blood pressure upon discharge should be 140/90 or below. -B12 614, TSH 2.66, ammonia elevated at 44.-Correction of hyponatremia per primary team -Continue thiamine supplementation -Continue propranolol for tremors -Continue Plavix when okay with Ortho from a surgical standpoint-for stroke prevention -Continue home statin  Follow-up with outpatient neurologist in 4 to 6 weeks after discharge.  Inpatient neurology will be available as needed.  Please call with questions.  Plan relayed via secure text page to the primary hospitalist.   Milon Dikes, MD [facility] Pager: [phone] If 7pm to 7am, please call on call as listed on AMION.

## 2019-09-11 NOTE — Anesthesia Preprocedure Evaluation (Addendum)
Anesthesia Evaluation  Patient identified by MRN, date of birth, ID band Patient awake    Reviewed: Allergy & Precautions, H&P , NPO status , reviewed documented beta blocker date and time   Airway Mallampati: II  TM Distance: >3 FB Neck ROM: limited    Dental  (+) Poor Dentition, Chipped, Missing   Pulmonary COPD, former smoker,    Pulmonary exam normal        Cardiovascular hypertension, Normal cardiovascular exam  09/10/19 ECHO IMPRESSIONS    1. Left ventricular ejection fraction, by estimation, is 60 to 65%. The  left ventricle has normal function. The left ventricle has no regional  wall motion abnormalities. There is mild left ventricular hypertrophy.  Left ventricular diastolic parameters  are indeterminate.  2. Right ventricular systolic function is normal. The right ventricular  size is normal. Tricuspid regurgitation signal is inadequate for assessing  PA pressure.  3. There is borderline dilatation of the aortic root measuring 37 mm.   PACs on EKG   Neuro/Psych Seizures -,  PSYCHIATRIC DISORDERS Anxiety Depression CVA    GI/Hepatic GERD  Medicated and Controlled,  Endo/Other    Renal/GU      Musculoskeletal   Abdominal   Peds  Hematology   Anesthesia Other Findings Past Medical History: No date: Anxiety No date: COPD (chronic obstructive pulmonary disease) (HCC) No date: Depression No date: Parkinson's disease (HCC) No date: Seizures (HCC) No date: Stroke [facility]) Past Surgical History: No date: Shoulder surgery No date: SPINAL FUSION     Comment:  cervical No date: TOTAL KNEE ARTHROPLASTY BMI    Body Mass Index: 32.08 kg/m     Reproductive/Obstetrics                            Anesthesia Physical Anesthesia Plan  ASA: IV and emergent  Anesthesia Plan: General   Post-op Pain Management:    Induction: Intravenous  PONV Risk Score and Plan: Ondansetron and  Treatment may vary due to age or medical condition  Airway Management Planned: Oral ETT  Additional Equipment:   Intra-op Plan:   Post-operative Plan: Extubation in OR  Informed Consent: I have reviewed the patients History and Physical, chart, labs and discussed the procedure including the risks, benefits and alternatives for the proposed anesthesia with the patient or authorized representative who has indicated his/her understanding and acceptance.     Dental Advisory Given  Plan Discussed with: CRNA  Anesthesia Plan Comments: (Recent Plavix precludes spinal)      Anesthesia Quick Evaluation

## 2019-09-11 NOTE — Progress Notes (Signed)
PT Cancellation Note  Patient Details Name: SHREYAS PIATKOWSKI MRN: [id number] DOB: 1945-03-21   Cancelled Treatment:    Reason Eval/Treat Not Completed: Patient at procedure or test/unavailable;Medical issues which prohibited therapy. Patient currently in surgery to manage ankle fx. Mobility consult contra-indicated. PT will close existing orders, please re-consult when patient is medically appropriate.    Alva Garnet PT, DPT, CSCS    09/11/2019, 8:54 AM

## 2019-09-11 NOTE — Plan of Care (Signed)

## 2019-09-11 NOTE — Anesthesia Postprocedure Evaluation (Signed)
Anesthesia Post Note  Patient: Ian Gill  Procedure(s) Performed: OPEN REDUCTION INTERNAL FIXATION (ORIF) ANKLE FRACTURE (Right Ankle) CLOSED REDUCTION 2nd toe (Left Foot)  Patient location during evaluation: PACU Anesthesia Type: General Level of consciousness: awake and alert Pain management: pain level controlled Vital Signs Assessment: post-procedure vital signs reviewed and stable Respiratory status: spontaneous breathing, nonlabored ventilation, respiratory function stable and patient connected to nasal cannula oxygen Cardiovascular status: blood pressure returned to baseline and stable Postop Assessment: no apparent nausea or vomiting Anesthetic complications: no   No complications documented.   Last Vitals:  Vitals:   09/11/19 1120 09/11/19 1156  BP: 101/64 (!) 118/50  Pulse: (!) 58 61  Resp: (!) 22 16  Temp:  36.8 C  SpO2: 91% 91%    Last Pain:  Vitals:   09/11/19 1200  TempSrc:   PainSc: 0-No pain                 Christia Reading

## 2019-09-11 NOTE — Progress Notes (Signed)
   09/10/19 2015  What Happened  Was fall witnessed? No  Was patient injured? No  Patient found on floor  Found by Staff-comment (Staff found patient on the floor)  Stated prior activity bathroom-unassisted  Follow Up  MD notified Manuela Schwartz  Time MD notified 2035 (page MD at 2035)  Family notified Yes - comment (Spoke to son - Loraine Leriche)  Time family notified 0600 (Around 6am 07/12/19  after completed tests results)  Additional tests Yes-comment (CT of the Head)  Simple treatment Other (comment) (None needed.)  Progress note created (see row info) Yes  Adult Fall Risk Assessment  Risk Factor Category (scoring not indicated) History of more than one fall within 6 months before admission (document High fall risk);High fall risk per protocol (document High fall risk)  Patient Fall Risk Level High fall risk  Adult Fall Risk Interventions  Required Bundle Interventions *See Row Information* High fall risk - low, moderate, and high requirements implemented  Additional Interventions Use of appropriate toileting equipment (bedpan, BSC, etc.);Room near nurses station;Reorient/diversional activities with confused patients  Screening for Fall Injury Risk (To be completed on HIGH fall risk patients) - Assessing Need for Low Bed  Risk For Fall Injury- Low Bed Criteria Admitted as a result of a fall  Will Implement Low Bed and Floor Mats Yes  Screening for Fall Injury Risk (To be completed on HIGH fall risk patients who do not meet crieteria for Low Bed) - Assessing Need for Floor Mats Only  Risk For Fall Injury- Criteria for Floor Mats Confusion/dementia (+NuDESC, CIWA, TBI, etc.);Noncompliant with safety precautions  Will Implement Floor Mats Yes  Vitals  Temp 98.6 F (37 C)  Temp Source Oral  BP (!) 153/71  MAP (mmHg) 92  BP Location Left Arm  BP Method Automatic  Patient Position (if appropriate) Lying  Pulse Rate 63  Pulse Rate Source Monitor  Resp (!) 21  Oxygen Therapy  SpO2 96  %  Pain Assessment  Pain Scale 0-10  Pain Score 0  Neurological  Neuro (WDL) X  Level of Consciousness Alert  Orientation Level Oriented to person;Oriented to place;Oriented to time;Oriented to situation  Cognition Appropriate at baseline;Appropriate judgement;Poor safety awareness;Memory impairment;Follows commands;Poor attention/concentration  Speech Clear  Pupil Assessment  No  Motor Function/Sensation Assessment Grip;Dorsiflexion;Plantar flexion;Pronator drift  R Hand Grip Strong  L Hand Grip Strong  R Elbow Extension (Push/Biceps) Moderate  L Elbow Extension (Push/Biceps) Moderate  R Elbow Flexion (Pull/Triceps) Moderate  L Elbow Flexion (Pull/Triceps) Moderate  Right Pronator Drift Absent  Left Pronator Drift Absent  R Foot Dorsiflexion Moderate  L Foot Dorsiflexion Moderate  R Foot Plantar Flexion Moderate  L Foot Plantar Flexion Moderate  Neuro Symptoms Tremors;Seizure;Forgetful  Neuro Additional Assessments Yes  Neuro Additional Assessments No  Tremors  Tremor Location Hands

## 2019-09-12 ENCOUNTER — Encounter: Payer: Self-pay | Admitting: Podiatry

## 2019-09-12 DIAGNOSIS — J449 Chronic obstructive pulmonary disease, unspecified: Secondary | ICD-10-CM | POA: Diagnosis not present

## 2019-09-12 LAB — BASIC METABOLIC PANEL
Anion gap: 10 (ref 5–15)
BUN: 11 mg/dL (ref 8–23)
CO2: 29 mmol/L (ref 22–32)
Calcium: 9.1 mg/dL (ref 8.9–10.3)
Chloride: 99 mmol/L (ref 98–111)
Creatinine, Ser: 0.85 mg/dL (ref 0.61–1.24)
GFR calc Af Amer: >60 mL/min (ref >60)
GFR calc non Af Amer: >60 mL/min (ref >60)
Glucose, Bld: 104 mg/dL — ABNORMAL HIGH (ref 70–99)
Potassium: 3.8 mmol/L (ref 3.5–5.1)
Sodium: 138 mmol/L (ref 135–145)

## 2019-09-12 LAB — CBC
HCT: 38.4 % — ABNORMAL LOW (ref 39.0–52.0)
Hemoglobin: 13.5 g/dL (ref 13.0–17.0)
MCH: 32.8 pg (ref 26.0–34.0)
MCHC: 35.2 g/dL (ref 30.0–36.0)
MCV: 93.2 fL (ref 80.0–100.0)
Platelets: 191 10*3/uL (ref 150–400)
RBC: 4.12 MIL/uL — ABNORMAL LOW (ref 4.22–5.81)
RDW: 11.7 % (ref 11.5–15.5)
WBC: 7.5 10*3/uL (ref 4.0–10.5)
nRBC: 0 % (ref 0.0–0.2)

## 2019-09-12 LAB — GLUCOSE, CAPILLARY
Glucose-Capillary: 75 mg/dL (ref 70–99)
Glucose-Capillary: 96 mg/dL (ref 70–99)

## 2019-09-12 LAB — SARS CORONAVIRUS 2 (TAT 6-24 HRS): SARS Coronavirus 2: NEGATIVE

## 2019-09-12 MED ORDER — NYSTATIN 100000 UNIT/ML MT SUSP
5.0000 mL | Freq: Four times a day (QID) | OROMUCOSAL | Status: DC
  Administered 2019-09-12 – 2019-09-14 (×9): 500000 [IU] via ORAL
  Filled 2019-09-12 (×8): qty 5

## 2019-09-12 NOTE — Progress Notes (Signed)
PT Cancellation Note  Patient Details Name: Ian Gill MRN: [id number] DOB: 1946/01/24   Cancelled Treatment:    Reason Eval/Treat Not Completed: Other (comment) (Pt with CNA at this time for a bath. PT to re-attempt as able.)   Olga Coaster PT, DPT 10:49 AM,09/12/19

## 2019-09-12 NOTE — Progress Notes (Signed)
Physical Therapy Re-Evaluation Patient Details Name: DUDLEY MAGES MRN: [id number] DOB: 1945-03-01 Today's Date: 09/12/2019    History of Present Illness JAVARION DOUTY is a 74 y.o. male s/p R anke ORIF due to changes in R ankle fracture, initially presented to ED for AMS and seizures. PMH  of seizure, hypertension, hyperlipidemia, COPD, stroke, GERD, depression, anxiety, Parkinson's disease, alcohol abuse, who presents with altered mental status and seizure.    PT Comments    Pt seen for re-evaluation due to ORIF of R ankle. Pt alert, oriented to self and date, displayed fluctuating mental status with confusion of location and situation throughout session, re-oriented as able. Supine to sit with modA and extensive cueing to maximize pt participation. Able to sit EOB and perform a few seated exercises with fair balance, but maxAx2 to scoot to EOB in preparation for transfers. The patient exhibited impulsivity with sit <> stand this session, often stood without warning. Pt unable to come into fully standing with maxAx2, posterior and L lean noted. Pt also stated he needed to have a BM, positioned on bed pan at EOB several times but unable to void. Returned to supine and bed pan placed, CNA notified of pt status. The patient would benefit from further skilled PT intervention to continue to progress towards goals. Recommendation remains appropriate.    Follow Up Recommendations  SNF     Equipment Recommendations  None recommended by PT;Other (comment)    Recommendations for Other Services       Precautions / Restrictions Precautions Precautions: Fall;Other (comment) Precaution Comments: seizure Required Braces or Orthoses: Other Brace Other Brace: ortho boot Restrictions Weight Bearing Restrictions: Yes RLE Weight Bearing: Non weight bearing    Mobility  Bed Mobility Overal bed mobility: Needs Assistance Bed Mobility: Supine to Sit;Sit to Supine     Supine to sit: Mod  assist Sit to supine: Mod assist;+2 for safety/equipment   General bed mobility comments: Assist for RLE mgmt and trunk elevation   Transfers Overall transfer level: Needs assistance Equipment used: Rolling walker (2 wheeled) Transfers: Sit to/from Stand Sit to Stand: Max assist;+2 physical assistance;From elevated surface         General transfer comment: Pt impulsive this session with attempted sit to stands. Unable to come to fully standing despite several attempts  Ambulation/Gait             General Gait Details: deferred   Stairs             Wheelchair Mobility    Modified Rankin (Stroke Patients Only)       Balance Overall balance assessment: Needs assistance Sitting-balance support: No upper extremity supported;Feet supported Sitting balance-Leahy Scale: Fair     Standing balance support: Bilateral upper extremity supported Standing balance-Leahy Scale: Zero                              Cognition Arousal/Alertness: Awake/alert Behavior During Therapy: WFL for tasks assessed/performed Overall Cognitive Status: No family/caregiver present to determine baseline cognitive functioning                                 General Comments: A&Ox2, disoriented to date (18th versus 16th), disoriented to place, situation. Flucuating mental status noted      Exercises Other Exercises Other Exercises: pt reoriented and education on weight bearing status at beginning and during session when  pt exhibited confusion about location. Other Exercises: SAQ and seated hip flexion x10 bilaterally with visual cueing Other Exercises: Pt reported need to have BM, several instances of sitting on bed pan at EOB, pt unable to void/stand pivot safely enough for BSC. Returned to supine with bed pan, CNA notified of pt status.    General Comments        Pertinent Vitals/Pain Pain Assessment: Faces Faces Pain Scale: Hurts little more Pain  Location: with R foot/ankle movement Pain Intervention(s): Limited activity within patient's tolerance;Monitored during session;Repositioned    Home Living                      Prior Function            PT Goals (current goals can now be found in the care plan section) Progress towards PT goals: Progressing toward goals    Frequency    7X/week      PT Plan Current plan remains appropriate    Co-evaluation   Reason for Co-Treatment: Necessary to address cognition/behavior during functional activity;For patient/therapist safety;To address functional/ADL transfers PT goals addressed during session: Mobility/safety with mobility;Balance;Proper use of DME OT goals addressed during session: ADL's and self-care      AM-PAC PT "6 Clicks" Mobility   Outcome Measure  Help needed turning from your back to your side while in a flat bed without using bedrails?: A Lot Help needed moving from lying on your back to sitting on the side of a flat bed without using bedrails?: A Lot Help needed moving to and from a bed to a chair (including a wheelchair)?: Total Help needed standing up from a chair using your arms (e.g., wheelchair or bedside chair)?: Total Help needed to walk in hospital room?: Total Help needed climbing 3-5 steps with a railing? : Total 6 Click Score: 8    End of Session Equipment Utilized During Treatment: Gait belt Activity Tolerance: Patient limited by fatigue Patient left: in bed;with call bell/phone within reach;with nursing/sitter in room Nurse Communication: Mobility status PT Visit Diagnosis: Difficulty in walking, not elsewhere classified (R26.2);Other abnormalities of gait and mobility (R26.89);History of falling (Z91.81);Pain Pain - Right/Left: Left (and right) Pain - part of body: Ankle and joints of foot (L flank and R foot)     Time: 1130-1157 PT Time Calculation (min) (ACUTE ONLY): 27 min  Charges:  $Therapeutic Exercise: 8-22 mins                      Olga Coaster PT, DPT 12:44 PM,09/12/19

## 2019-09-12 NOTE — Care Management Important Message (Signed)
Important Message  Patient Details  Name: Ian Gill MRN: [id number] Date of Birth: Mar 28, 1945   Medicare Important Message Given:  Yes     Bernadette Hoit 09/12/2019, 11:15 AM

## 2019-09-12 NOTE — TOC Progression Note (Signed)
Transition of Care [facility]) - Progression Note    Patient Details  Name: Ian Gill MRN: [id number] Date of Birth: [birth year]  [facility] of [facility] CM/SW Contact  Allayne Butcher, RN Phone Number: 09/12/2019, 3:34 PM  Clinical Narrative:    Plan for discharge tomorrow to [facility].  Patient is post op day 1 from ORIF left ankle.     Expected Discharge Plan: Skilled Nursing Facility Barriers to Discharge: Continued Medical Work up  Expected Discharge Plan and Services Expected Discharge Plan: Skilled Nursing Facility   Discharge Planning Services: CM Consult Post Acute Care Choice: Skilled Nursing Facility Living arrangements for the past 2 months: Independent Living Facility                                       Social Determinants of Health (SDOH) Interventions    Readmission Risk Interventions No flowsheet data found.

## 2019-09-12 NOTE — Progress Notes (Signed)
PODIATRY / FOOT AND ANKLE SURGERY PROGRESS NOTE  Reason for consult: Left second toe pain, right ankle fracture  Chief Complaint: Right ankle pain   HPI: Ian Gill is a 74 y.o. male who presents status post 1 day right ankle open reduction with internal fixation with closed reduction of left second toe.  Patient is kept his dressings clean, dry, and intact since surgical procedure and remains in the boot to the right lower extremity at this time.  Patient states that his pain is fairly well controlled overall today.  He notes that he has some mild pain time to time but is taking pain medication and it seems to be controlled.  Patient denies any other issues today.  PMHx:  Past Medical History:  Diagnosis Date  . Anxiety   . COPD (chronic obstructive pulmonary disease) (HCC)   . Depression   . Parkinson's disease (HCC)   . Seizures (HCC)   . Stroke [facility])     Surgical Hx:  Past Surgical History:  Procedure Laterality Date  . ANKLE CLOSED REDUCTION Left 09/11/2019   Procedure: CLOSED REDUCTION 2nd toe;  Surgeon: Gwyneth Revels, DPM;  Location: [facility] ORS;  Service: Podiatry;  Laterality: Left;  . ORIF ANKLE FRACTURE Right 09/11/2019   Procedure: OPEN REDUCTION INTERNAL FIXATION (ORIF) ANKLE FRACTURE;  Surgeon: Gwyneth Revels, DPM;  Location: [facility] ORS;  Service: Podiatry;  Laterality: Right;  . Shoulder surgery    . SPINAL FUSION     cervical  . TOTAL KNEE ARTHROPLASTY      FHx:  Family History  Problem Relation Age of Onset  . COPD Father     Social History:  reports that he has quit smoking. He has never used smokeless tobacco. He reports current alcohol use of about 3.0 standard drinks of alcohol per week. He reports that he does not use drugs.  Allergies:  Allergies  Allergen Reactions  . Bupropion Other (See Comments)    Seizures  Seizure seizures Seizure   . Dilantin [Phenytoin Sodium Extended]     Past issues with toxicity per pt's son  . Iodinated Diagnostic  Agents Swelling    Review of Systems: General ROS: negative Respiratory ROS: no cough, shortness of breath, or wheezing Cardiovascular ROS: no chest pain or dyspnea on exertion Gastrointestinal ROS: no abdominal pain, change in bowel habits, or black or bloody stools Musculoskeletal ROS: positive for - joint pain, joint stiffness and joint swelling Neurological ROS: positive for - numbness/tingling Dermatological ROS: negative  Medications Prior to Admission  Medication Sig Dispense Refill  . amLODipine (NORVASC) 10 MG tablet Take 1 tablet (10 mg total) by mouth daily. 30 tablet 0  . atorvastatin (LIPITOR) 20 MG tablet Take 1 tablet (20 mg total) by mouth daily. 60 tablet 0  . Cholecalciferol 125 MCG (5000 UT) capsule Take 5,000 Units by mouth daily.    . clopidogrel (PLAVIX) 75 MG tablet Take 1 tablet (75 mg total) by mouth daily. 60 tablet 0  . divalproex (DEPAKOTE) 250 MG DR tablet Take 3 tablets (750 mg total) by mouth every morning. 90 tablet 0  . divalproex (DEPAKOTE) 500 MG DR tablet Take 2 tablets (1,000 mg total) by mouth at bedtime. 60 tablet 0  . lamoTRIgine (LAMICTAL) 150 MG tablet Take 1 tablet (150 mg total) by mouth 2 (two) times daily. 60 tablet 0  . levETIRAcetam (KEPPRA) 500 MG tablet Take 3 tablets (1,500 mg total) by mouth 2 (two) times daily. 180 tablet 0  . lisinopril (  ZESTRIL) 20 MG tablet Take 1 tablet (20 mg total) by mouth daily. 30 tablet 0  . Multiple Vitamin (MULTI-VITAMIN) tablet Take 1 tablet by mouth daily.    . Omega-3 Fatty Acids (RA FISH OIL) 1000 MG CAPS Take 1 capsule by mouth daily.    . propranolol (INDERAL) 20 MG tablet Take 20 mg by mouth 2 (two) times daily.    . sertraline (ZOLOFT) 100 MG tablet Take 200 mg by mouth daily.    . Melatonin 10 MG TABS Take 1 tablet by mouth at bedtime. (Patient not taking: Reported on 05/30/2019) 60 tablet 0    Physical Exam: General: Alert and oriented.  No apparent distress. Surgical dressings to right foot  clean, dry, and intact with boot in place.  No strikethrough noted.  Patient able to dorsiflex and plantarflex digits to both feet.  Capillary fill time appears to be intact to digits bilateral.  Results for orders placed or performed during the hospital encounter of 09/08/19 (from the past 48 hour(s))  Hemoglobin A1c     Status: None   Collection Time: 09/11/19  7:30 AM  Result Value Ref Range   Hgb A1c MFr Bld 5.1 4.8 - 5.6 %    Comment: (NOTE) Pre diabetes:          5.7%-6.4%  Diabetes:              >6.4%  Glycemic control for   <7.0% adults with diabetes    Mean Plasma Glucose 99.67 mg/dL    Comment: Performed at [facility], [address] [place], [place] [address]  Lipid panel     Status: Abnormal   Collection Time: 09/11/19  7:30 AM  Result Value Ref Range   Cholesterol 110 0 - 200 mg/dL   Triglycerides 68 <092 mg/dL   HDL 37 (L) >33 mg/dL   Total CHOL/HDL Ratio 3.0 RATIO   VLDL 14 0 - 40 mg/dL   LDL Cholesterol 59 0 - 99 mg/dL    Comment:        Total Cholesterol/HDL:CHD Risk Coronary Heart Disease Risk Table                     Men   Women  1/2 Average Risk   3.4   3.3  Average Risk       5.0   4.4  2 X Average Risk   9.6   7.1  3 X Average Risk  23.4   11.0        Use the calculated Patient Ratio above and the CHD Risk Table to determine the patient's CHD Risk.        ATP III CLASSIFICATION (LDL):  <100     mg/dL   Optimal  007-622  mg/dL   Near or Above                    Optimal  130-159  mg/dL   Borderline  633-354  mg/dL   High  >562     mg/dL   Very High Performed at [facility], [address] [place], [place] [address]   CBC     Status: Abnormal   Collection Time: 09/11/19  7:30 AM  Result Value Ref Range   WBC 5.0 4.0 - 10.5 K/uL   RBC 4.14 (L) 4.22 - 5.81 MIL/uL   Hemoglobin 13.8 13.0 - 17.0 g/dL   HCT 37.3 (L) 39 - 52 %   MCV  93.5 80.0 - 100.0 fL   MCH 33.3 26.0 - 34.0 pg   MCHC 35.7 30.0 - 36.0 g/dL   RDW 16.1 09.6 -  04.5 %   Platelets 197 150 - 400 K/uL   nRBC 0.0 0.0 - 0.2 %    Comment: Performed at [facility] [address] [place], [place] [address]  Basic metabolic panel     Status: None   Collection Time: 09/11/19  7:30 AM  Result Value Ref Range   Sodium 139 135 - 145 mmol/L   Potassium 3.5 3.5 - 5.1 mmol/L   Chloride 102 98 - 111 mmol/L   CO2 26 22 - 32 mmol/L   Glucose, Bld 91 70 - 99 mg/dL    Comment: Glucose reference range applies only to samples taken after fasting for at least 8 hours.   BUN 10 8 - 23 mg/dL   Creatinine, Ser 1.91 0.61 - 1.24 mg/dL   Calcium 9.3 8.9 - 47.8 mg/dL   GFR calc non Af Amer >60 >60 mL/min   GFR calc Af Amer >60 >60 mL/min   Anion gap 11 5 - 15    Comment: Performed at [facility], [address] [place], [place] [address]  Ammonia     Status: None   Collection Time: 09/11/19  7:30 AM  Result Value Ref Range   Ammonia 21 9 - 35 umol/L    Comment: Performed at [facility], [address] [place], [place] [address]  Glucose, capillary     Status: Abnormal   Collection Time: 09/11/19  5:00 PM  Result Value Ref Range   Glucose-Capillary 102 (H) 70 - 99 mg/dL    Comment: Glucose reference range applies only to samples taken after fasting for at least 8 hours.  CBC     Status: Abnormal   Collection Time: 09/12/19  5:17 AM  Result Value Ref Range   WBC 7.5 4.0 - 10.5 K/uL   RBC 4.12 (L) 4.22 - 5.81 MIL/uL   Hemoglobin 13.5 13.0 - 17.0 g/dL   HCT 57.8 (L) 39 - 52 %   MCV 93.2 80.0 - 100.0 fL   MCH 32.8 26.0 - 34.0 pg   MCHC 35.2 30.0 - 36.0 g/dL   RDW 46.9 62.9 - 52.8 %   Platelets 191 150 - 400 K/uL   nRBC 0.0 0.0 - 0.2 %    Comment: Performed at [facility], [address], [place], [place] [address]  Basic metabolic panel     Status: Abnormal   Collection Time: 09/12/19  5:17 AM  Result Value Ref Range   Sodium 138 135 - 145 mmol/L   Potassium 3.8 3.5 - 5.1 mmol/L   Chloride 99 98 - 111 mmol/L    CO2 29 22 - 32 mmol/L   Glucose, Bld 104 (H) 70 - 99 mg/dL    Comment: Glucose reference range applies only to samples taken after fasting for at least 8 hours.   BUN 11 8 - 23 mg/dL   Creatinine, Ser 4.01 0.61 - 1.24 mg/dL   Calcium 9.1 8.9 - 02.7 mg/dL   GFR calc non Af Amer >60 >60 mL/min   GFR calc Af Amer >60 >60 mL/min   Anion gap 10 5 - 15    Comment: Performed at [facility], [address] [place], [place] [address]  Glucose, capillary     Status: None   Collection Time: 09/12/19  8:16 AM  Result Value  Ref Range   Glucose-Capillary 75 70 - 99 mg/dL    Comment: Glucose reference range applies only to samples taken after fasting for at least 8 hours.   CT HEAD WO CONTRAST  Result Date: 09/10/2019 CLINICAL DATA:  Altered mental status and seizures. EXAM: CT HEAD WITHOUT CONTRAST TECHNIQUE: Contiguous axial images were obtained from the base of the skull through the vertex without intravenous contrast. COMPARISON:  September 08, 2019 FINDINGS: Brain: There is mild cerebral atrophy with widening of the extra-axial spaces and ventricular dilatation. There are areas of decreased attenuation within the white matter tracts of the supratentorial brain, consistent with microvascular disease changes. Vascular: No hyperdense vessel or unexpected calcification. Skull: Normal. Negative for fracture or focal lesion. Sinuses/Orbits: No acute finding. Other: None. IMPRESSION: 1. Generalized cerebral atrophy. 2. No acute intracranial abnormality. Electronically Signed   By: Aram Candela M.D.   On: 09/10/2019 21:43   MR ANGIO HEAD WO CONTRAST  Result Date: 09/10/2019 CLINICAL DATA:  Stroke, follow-up EXAM: MRA HEAD WITHOUT CONTRAST TECHNIQUE: Angiographic images of the Circle of Willis were obtained using MRA technique without intravenous contrast. COMPARISON:  None. FINDINGS: Motion artifact is present intracranial internal carotid arteries are patent. Middle and anterior cerebral arteries  are patent. Intracranial vertebral arteries are partially included but patent. Basilar and posterior cerebral arteries are patent. Bilateral posterior communicating arteries are present. There is no significant stenosis or aneurysm. IMPRESSION: No proximal intracranial vessel occlusion or significant stenosis. Electronically Signed   By: Guadlupe Spanish M.D.   On: 09/10/2019 13:18   US Carotid Bilateral  Result Date: 09/10/2019 CLINICAL DATA:  74 year old male with a history of stroke EXAM: BILATERAL CAROTID DUPLEX ULTRASOUND TECHNIQUE: Wallace Cullens scale imaging, color Doppler and duplex ultrasound were performed of bilateral carotid and vertebral arteries in the neck. COMPARISON:  None. FINDINGS: Criteria: Quantification of carotid stenosis is based on velocity parameters that correlate the residual internal carotid diameter with NASCET-based stenosis levels, using the diameter of the distal internal carotid lumen as the denominator for stenosis measurement. The following velocity measurements were obtained: RIGHT ICA:  Systolic 83 cm/sec, Diastolic 23 cm/sec CCA:  65 cm/sec SYSTOLIC ICA/CCA RATIO:  1.3 ECA:  112 cm/sec LEFT ICA:  Systolic 91 cm/sec, Diastolic 27 cm/sec CCA:  113 cm/sec SYSTOLIC ICA/CCA RATIO:  0.8 ECA:  96 cm/sec Right Brachial SBP: Not acquired Left Brachial SBP: Not acquired RIGHT CAROTID ARTERY: No significant calcifications of the right common carotid artery. Intermediate waveform maintained. Heterogeneous and partially calcified plaque at the right carotid bifurcation. No significant lumen shadowing. Low resistance waveform of the right ICA. No significant tortuosity. RIGHT VERTEBRAL ARTERY: Antegrade flow with low resistance waveform. LEFT CAROTID ARTERY: No significant calcifications of the left common carotid artery. Intermediate waveform maintained. Heterogeneous and partially calcified plaque at the left carotid bifurcation without significant lumen shadowing. Low resistance waveform of the  left ICA. No significant tortuosity. LEFT VERTEBRAL ARTERY:  Antegrade flow with low resistance waveform. IMPRESSION: Color duplex indicates minimal heterogeneous and calcified plaque, with no hemodynamically significant stenosis by duplex criteria in the extracranial cerebrovascular circulation. Signed, Yvone Neu. Reyne Dumas, RPVI [facility] Electronically Signed   By: Gilmer Mor D.O.   On: 09/10/2019 15:09   DG MINI C-ARM IMAGE ONLY  Result Date: 09/11/2019 There is no interpretation for this exam.  This order is for images obtained during a surgical procedure.  Please See "Surgeries" Tab for more information regarding the procedure.  Blood pressure (!) 112/52, pulse 60, temperature (!) 97.3 F (36.3 C), resp. rate 17, height 5\' 11"  (1.803 m), weight 104.3 kg, SpO2 91 %.  Assessment 1. Right ankle fracture s/p ORIF 2. Left 2nd toe dislocation s/p relocation closed reduction  Plan -Patient seen and examined. -Dressing appears to be clean, dry, and intact to the right lower extremity.  Patient has no pain currently to the left lower extremity. -Patient to maintain nonweightbearing at all times to the right lower extremity in the boot as the boot is acting as a cast. -Dr. Ether GriffinsFowler will likely remove the dressing to the right lower extremity and reassess the incision line tomorrow.  At that time we will likely sign off on the patient. -Appreciate medicine recommendations for pain management.  Pain appears to be controlled at this time. -PT/OT ordered.  Rosetta PosnerAndrew Emrick Hensch 09/12/2019, 12:22 PM

## 2019-09-12 NOTE — Progress Notes (Signed)
Occupational Therapy Treatment Patient Details Name: Ian Gill MRN: [id number] DOB: [birth year] Today's Date: 09/12/2019    History of present illness PER BEAGLEY is a 74 y.o. male s/p R anke ORIF due to changes in R ankle fracture, initially presented to ED for AMS and seizures. PMH  of seizure, hypertension, hyperlipidemia, COPD, stroke, GERD, depression, anxiety, Parkinson's disease, alcohol abuse, who presents with altered mental status and seizure.   OT comments  Ian Gill was seen for OT treatment POD#1 from above surgery, overlapping c PT for safe mobility. Upon arrival to room pt seated EOB c PT agreeable to attempt OOB mobility. Pt demonstrated waning orientation, stating he wanted door closed to "hide from [place] across the hall", then following prompting stated he was confused thinking he was at home not in hospital. Pt was impulsive t/o session, initiating standing when asked to scoot forward and repeatedly w/o warning to therapists. Pt required MAX A x2 + RW to clear rear, unable to achieve upright posture. Pt noted to lock legs out and lean posteriorly in standing. Pt stated need for BM, bed pan placed in sitting. TOTAL A for perihygiene in standing. Pt ultimately returned to bed c MAX A x2 for completion of BM on bed pan - NT notified. Pt making progress toward goals. Pt continues to benefit from skilled OT services to maximize return to PLOF and minimize risk of future falls, injury, caregiver burden, and readmission. Will continue to follow POC. Discharge recommendation remains appropriate.    Follow Up Recommendations  SNF    Precautions / Restrictions Precautions Precautions: Fall;Other (comment) Precaution Comments: seizure Required Braces or Orthoses: Other Brace Other Brace: ortho boot Restrictions Weight Bearing Restrictions: Yes RLE Weight Bearing: Non weight bearing       Mobility Bed Mobility Overal bed mobility: Needs Assistance Bed Mobility: Sit to  Supine     Supine to sit: Mod assist Sit to supine: Mod assist;+2 for safety/equipment   General bed mobility comments: Assist for RLE mgmt and trunk control  Transfers Overall transfer level: Needs assistance Equipment used: Rolling walker (2 wheeled) Transfers: Sit to/from Stand Sit to Stand: Max assist;+2 physical assistance;From elevated surface         General transfer comment: Pt impulsive this session with attempted sit to stands. Unable to come to fully standing despite several attempts    Balance Overall balance assessment: Needs assistance Sitting-balance support: No upper extremity supported;Feet supported Sitting balance-Leahy Scale: Fair     Standing balance support: Bilateral upper extremity supported Standing balance-Leahy Scale: Zero                             ADL either performed or assessed with clinical judgement   ADL Overall ADL's : Needs assistance/impaired                                       General ADL Comments: TOTAL A perihygiene in standing. MAX A x2 for ADL t/f. MAX A for LBD at bed level      Vision       Perception     Praxis      Cognition Arousal/Alertness: Awake/alert Behavior During Therapy: WFL for tasks assessed/performed Overall Cognitive Status: No family/caregiver present to determine baseline cognitive functioning  General Comments: A&Ox2, disoriented to date and place. Flucuating mental status noted        Exercises Exercises: Other exercises Other Exercises Other Exercises: Pt educated re: call bell, falls prevetnion, safe RW use, functional application of NWBing pcns Other Exercises: LBD, toileting, sit<>stand x3, sit>sup, sitting/standing balance/tolerance Other Exercises: Pt reported need to have BM, several instances of sitting on bed pan at EOB, pt unable to void/stand pivot safely enough for BSC. Returned to supine with bed pan, CNA  notified of pt status.   Shoulder Instructions       General Comments      Pertinent Vitals/ Pain       Pain Assessment: Faces Faces Pain Scale: Hurts little more Pain Location: with R foot/ankle movement Pain Descriptors / Indicators: Aching;Discomfort;Dull Pain Intervention(s): Limited activity within patient's tolerance;Repositioned  Home Living                                          Prior Functioning/Environment              Frequency  Min 1X/week        Progress Toward Goals  OT Goals(current goals can now be found in the care plan section)  Progress towards OT goals: Progressing toward goals  Acute Rehab OT Goals Patient Stated Goal: to return home OT Goal Formulation: With patient Time For Goal Achievement: 09/23/19 Potential to Achieve Goals: Good ADL Goals Pt Will Perform Grooming: with modified independence;sitting Pt Will Perform Lower Body Dressing: with min assist;with adaptive equipment;sitting/lateral leans (c no VCs to maintain NWBing pcns) Pt Will Transfer to Toilet: with mod assist;stand pivot transfer;bedside commode (c LRAD PRN)  Plan Discharge plan remains appropriate;Frequency remains appropriate    Co-evaluation    PT/OT/SLP Co-Evaluation/Treatment: Yes Reason for Co-Treatment: Necessary to address cognition/behavior during functional activity;For patient/therapist safety;To address functional/ADL transfers PT goals addressed during session: Mobility/safety with mobility;Balance;Proper use of DME OT goals addressed during session: ADL's and self-care;Proper use of Adaptive equipment and DME      AM-PAC OT "6 Clicks" Daily Activity     Outcome Measure   Help from another person eating meals?: A Little Help from another person taking care of personal grooming?: A Little Help from another person toileting, which includes using toliet, bedpan, or urinal?: A Lot Help from another person bathing (including washing,  rinsing, drying)?: A Lot Help from another person to put on and taking off regular upper body clothing?: A Little Help from another person to put on and taking off regular lower body clothing?: A Lot 6 Click Score: 15    End of Session Equipment Utilized During Treatment: Gait belt;Rolling walker  OT Visit Diagnosis: Unsteadiness on feet (R26.81);Other abnormalities of gait and mobility (R26.89);History of falling (Z91.81)   Activity Tolerance Other (comment) (Pt limited by impulsivity and cognition)   Patient Left in bed;with call bell/phone within reach;with bed alarm set   Nurse Communication Other (comment) (pt on bed pan )        Time: 4481-8563 OT Time Calculation (min): 14 min  Charges: OT General Charges $OT Visit: 1 Visit OT Treatments $Self Care/Home Management : 8-22 mins  Kathie Dike, M.S. OTR/L  09/12/19, 1:54 PM  ascom [phone]

## 2019-09-12 NOTE — Progress Notes (Signed)
Phone call received from CCMD regarding patient. Stated that he appears to be in A flutter. Patient assessed and noted to be in no distress. Pulse rate of 69 bpm on telemetry. He denies dizziness, lightheadedness, etc. No confusion noted. Telemetry leads readjusted. Patient repositioned in bed. No other interventions.

## 2019-09-12 NOTE — Progress Notes (Signed)
PROGRESS NOTE    Ian Gill  ZOX:[id number] DOB: 04-05-1945 DOA: 09/08/2019 PCP: Barbette ReichmannHande, Vishwanath, MD   Brief Narrative: [id number] year old with past medical history significant for seizure, hypertension, hyperlipidemia, COPD, stroke, depression with anxiety, alcohol use who presents with altered mental status and seizures. Patient Recently discharged from skilled nursing facility to independent living facility after he had a right ankle fracture.  Day of admission patient was noted to be confused.  His son think patient probably had a seizure episode.  Patient had a fall and has bruises.   Evaluation in the ED patient valproic acid was subtherapeutic at 11.  18, blood pressure elevated 193/79.  Chest x-ray show vascular crowding with low lung volumes.  CT head negative for acute intracranial abnormalities.    Assessment & Plan:   Principal Problem:   Seizure (HCC) Active Problems:   Alcohol use   COPD (chronic obstructive pulmonary disease) (HCC)   Depression   Stroke (HCC)   HLD (hyperlipidemia)   GERD (gastroesophageal reflux disease)   HTN (hypertension)   Acute metabolic encephalopathy   Hypertensive urgency   Fall  1-Seizure and acute metabolic encephalopathy: -CT head negative for acute intracranial abnormality. -Patient was restarted on IV Keppra, Depakote and Lamictal -Patient is alert and oriented x3, per son patient has improved but not back to baseline. -MRI: Punctate left corona radiata DWI hyperintensities may reflect tiny subacute infarcts versus artifact. Remote small right frontal microhemorrhage. -neurology consulted, unlikely MRI finding are related to patient confusion and or stroke.  -Plan for EEG. Discussed with Neurology we dont have to wait for EEG results to proceed to sx.  -Depakote dose reduce.  -Ammonia level mildly elevated. Started  low dose lactulose. Follow level. LFT normal. Depakote dose decreased.  -Ammonia down to normal level.  -per  neurology component of Hypertensive encephalopathy  -EEG if mental status fluctuates.  -Alert and conversant.   2-Alcohol use: Patient has been sober for the last 6 weeks.  3-COPD: Continue with bronchodilators.  4-Depression: Continue with medications  5-History of stroke continue with Plavix and Lipitor.  6-GERD: Continue with Pepcid.   7-Hypertension: Continue with IV hydralazine as needed Continue with amlodipine lisinopril propanolol BP improving.  Holding meds this am, SBP soft.   Fall: PT OT eval Ribs x ray; number 10 rib fracture Incentive hysterometry Fell last night. CT head no acute abnormalities. Fall precaution.   Right ankle fracture: Discussed with podiatry, x-ray looks worse recommendation is for surgical intervention.  plan to proceed on Sunday Mildly displaced distal fibular fracture. 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  Close reduction dislocated second toe PIP J. 8/15. Needs dressing changes 8/17.   Estimated body mass index is 32.08 kg/m as calculated from the following:   Height as of this encounter: 5\' 11"  (1.803 m).   Weight as of this encounter: 104.3 kg.   DVT prophylaxis: Lovenox Code Status: DNR Family Communication: Discussed with son who was at bedside Disposition Plan:  Status is: Inpatient  Remains inpatient appropriate because:Hemodynamically unstable   Dispo: The patient is from: Home              Anticipated d/c is to: SNF              Anticipated d/c date is: 3 days              Patient currently is not medically stable  to d/c.  SNF 8/17       Consultants:   Podiatry   Procedures:   none  Antimicrobials:    Subjective: Alert , oriented to place, person, situation.  He report ankle pain is controlled  Objective: Vitals:   09/12/19 0435 09/12/19 0817 09/12/19 0951 09/12/19 1228  BP: (!) 125/43 (!)  118/38 (!) 112/52 (!) 126/56  Pulse: 62 75 60 69  Resp: 16 17 17 17   Temp: 98.1 F (36.7 C) 98.1 F (36.7 C) (!) 97.3 F (36.3 C) 97.9 F (36.6 C)  TempSrc: Oral     SpO2: 98% 97% 91% 91%  Weight:      Height:        Intake/Output Summary (Last 24 hours) at 09/12/2019 1318 Last data filed at 09/12/2019 0534 Gross per 24 hour  Intake --  Output 925 ml  Net -925 ml   Filed Weights   09/07/19 1438  Weight: 104.3 kg    Examination:  General exam: NAD Respiratory system: CTA Cardiovascular system: S 1, S 2 RRR Gastrointestinal system: BS present, soft, nt Central nervous system: Alert and orieneted Extremities: RLE immobilizer.   Data Reviewed: I have personally reviewed following labs and imaging studies  CBC: Recent Labs  Lab 09/07/19 1445 09/09/19 0434 09/10/19 0614 09/11/19 0730 09/12/19 0517  WBC 6.8 5.9 6.3 5.0 7.5  HGB 14.6 13.0 13.3 13.8 13.5  HCT 42.0 35.9* 38.9* 38.7* 38.4*  MCV 94.8 93.5 95.8 93.5 93.2  PLT 187 181 178 197 191   Basic Metabolic Panel: Recent Labs  Lab 09/07/19 1445 09/09/19 0434 09/10/19 0614 09/11/19 0730 09/12/19 0517  NA 140 136 138 139 138  K 4.1 3.7 4.1 3.5 3.8  CL 101 101 100 102 99  CO2 24 26 30 26 29   GLUCOSE 116* 99 99 91 104*  BUN 12 14 12 10 11   CREATININE 0.85 0.78 0.76 0.74 0.85  CALCIUM 9.6 8.7* 9.1 9.3 9.1   GFR: Estimated Creatinine Clearance: 92.3 mL/min (by C-G formula based on SCr of 0.85 mg/dL). Liver Function Tests: Recent Labs  Lab 09/07/19 1445  AST 70*  ALT 21  ALKPHOS 56  BILITOT 1.3*  PROT 7.2  ALBUMIN 4.5   No results for input(s): LIPASE, AMYLASE in the last 168 hours. Recent Labs  Lab 09/08/19 0429 09/10/19 1135 09/11/19 0730  AMMONIA 18 44* 21   Coagulation Profile: No results for input(s): INR, PROTIME in the last 168 hours. Cardiac Enzymes: No results for input(s): CKTOTAL, CKMB, CKMBINDEX, TROPONINI in the last 168 hours. BNP (last 3 results) No results for input(s):  PROBNP in the last 8760 hours. HbA1C: Recent Labs    09/11/19 0730  HGBA1C 5.1   CBG: Recent Labs  Lab 09/11/19 1700 09/12/19 0816 09/12/19 1227  GLUCAP 102* 75 96   Lipid Profile: Recent Labs    09/11/19 0730  CHOL 110  HDL 37*  LDLCALC 59  TRIG 68  CHOLHDL 3.0   Thyroid Function Tests: Recent Labs    09/10/19 1135  TSH 2.660   Anemia Panel: Recent Labs    09/10/19 1135  VITAMINB12 614   Sepsis Labs: No results for input(s): PROCALCITON, LATICACIDVEN in the last 168 hours.  Recent Results (from the past 240 hour(s))  SARS Coronavirus 2 by RT PCR (hospital order, performed in [facility] hospital lab) Nasopharyngeal Nasopharyngeal Swab     Status: None   Collection Time: 09/08/19  7:36 AM   Specimen: Nasopharyngeal Swab  Result Value  Ref Range Status   SARS Coronavirus 2 NEGATIVE NEGATIVE Final    Comment: (NOTE) SARS-CoV-2 target nucleic acids are NOT DETECTED.  The SARS-CoV-2 RNA is generally detectable in upper and lower respiratory specimens during the acute phase of infection. The lowest concentration of SARS-CoV-2 viral copies this assay can detect is 250 copies / mL. A negative result does not preclude SARS-CoV-2 infection and should not be used as the sole basis for treatment or other patient management decisions.  A negative result may occur with improper specimen collection / handling, submission of specimen other than nasopharyngeal swab, presence of viral mutation(s) within the areas targeted by this assay, and inadequate number of viral copies (<250 copies / mL). A negative result must be combined with clinical observations, patient history, and epidemiological information.  Fact Sheet for Patients:   BoilerBrush.com.cy  Fact Sheet for Healthcare Providers: https://pope.com/  This test is not yet approved or  cleared by the [place] FDA and has been authorized for detection and/or  diagnosis of SARS-CoV-2 by FDA under an Emergency Use Authorization (EUA).  This EUA will remain in effect (meaning this test can be used) for the duration of the COVID-19 declaration under Section 564(b)(1) of the Act, 21 U.S.C. section 360bbb-3(b)(1), unless the authorization is terminated or revoked sooner.  Performed at [facility], [address], [place], [place] [address]          Radiology Studies: CT HEAD WO CONTRAST  Result Date: 09/10/2019 CLINICAL DATA:  Altered mental status and seizures. EXAM: CT HEAD WITHOUT CONTRAST TECHNIQUE: Contiguous axial images were obtained from the base of the skull through the vertex without intravenous contrast. COMPARISON:  September 08, 2019 FINDINGS: Brain: There is mild cerebral atrophy with widening of the extra-axial spaces and ventricular dilatation. There are areas of decreased attenuation within the white matter tracts of the supratentorial brain, consistent with microvascular disease changes. Vascular: No hyperdense vessel or unexpected calcification. Skull: Normal. Negative for fracture or focal lesion. Sinuses/Orbits: No acute finding. Other: None. IMPRESSION: 1. Generalized cerebral atrophy. 2. No acute intracranial abnormality. Electronically Signed   By: Aram Candela M.D.   On: 09/10/2019 21:43   US Carotid Bilateral  Result Date: 09/10/2019 CLINICAL DATA:  74 year old male with a history of stroke EXAM: BILATERAL CAROTID DUPLEX ULTRASOUND TECHNIQUE: Wallace Cullens scale imaging, color Doppler and duplex ultrasound were performed of bilateral carotid and vertebral arteries in the neck. COMPARISON:  None. FINDINGS: Criteria: Quantification of carotid stenosis is based on velocity parameters that correlate the residual internal carotid diameter with NASCET-based stenosis levels, using the diameter of the distal internal carotid lumen as the denominator for stenosis measurement. The following velocity measurements were obtained: RIGHT  ICA:  Systolic 83 cm/sec, Diastolic 23 cm/sec CCA:  65 cm/sec SYSTOLIC ICA/CCA RATIO:  1.3 ECA:  112 cm/sec LEFT ICA:  Systolic 91 cm/sec, Diastolic 27 cm/sec CCA:  113 cm/sec SYSTOLIC ICA/CCA RATIO:  0.8 ECA:  96 cm/sec Right Brachial SBP: Not acquired Left Brachial SBP: Not acquired RIGHT CAROTID ARTERY: No significant calcifications of the right common carotid artery. Intermediate waveform maintained. Heterogeneous and partially calcified plaque at the right carotid bifurcation. No significant lumen shadowing. Low resistance waveform of the right ICA. No significant tortuosity. RIGHT VERTEBRAL ARTERY: Antegrade flow with low resistance waveform. LEFT CAROTID ARTERY: No significant calcifications of the left common carotid artery. Intermediate waveform maintained. Heterogeneous and partially calcified plaque at the left carotid bifurcation without significant lumen shadowing. Low resistance waveform of the left  ICA. No significant tortuosity. LEFT VERTEBRAL ARTERY:  Antegrade flow with low resistance waveform. IMPRESSION: Color duplex indicates minimal heterogeneous and calcified plaque, with no hemodynamically significant stenosis by duplex criteria in the extracranial cerebrovascular circulation. Signed, Yvone Neu. Reyne Dumas, RPVI [facility] Radiology Electronically Signed   By: Gilmer Mor D.O.   On: 09/10/2019 15:09   DG MINI C-ARM IMAGE ONLY  Result Date: 09/11/2019 There is no interpretation for this exam.  This order is for images obtained during a surgical procedure.  Please See "Surgeries" Tab for more information regarding the procedure.        Scheduled Meds: .  stroke: mapping our early stages of recovery book   Does not apply Once  . amLODipine  10 mg Oral Daily  . atorvastatin  20 mg Oral Daily  . cholecalciferol  5,000 Units Oral Daily  . clopidogrel  75 mg Oral Daily  . divalproex  250 mg Oral Q12H  . enoxaparin (LOVENOX)  injection  40 mg Subcutaneous Q24H  . folic acid  1 mg Oral Daily  . lactulose  20 g Oral Daily  . lamoTRIgine  150 mg Oral BID  . lisinopril  20 mg Oral Daily  . multivitamin with minerals  1 tablet Oral Daily  . omega-3 acid ethyl esters  1,000 mg Oral Daily  . propranolol  20 mg Oral BID  . sertraline  200 mg Oral Daily  . thiamine  100 mg Oral Daily   Or  . thiamine  100 mg Intravenous Daily   Continuous Infusions: . levETIRAcetam 1,500 mg (09/12/19 0016)     LOS: 4 days    Time spent: 35 minutes.     Alba Cory, MD [facility]   If 7PM-7AM, please contact night-coverage [email or web address]  09/12/2019, 1:18 PM

## 2019-09-13 ENCOUNTER — Other Ambulatory Visit: Payer: Self-pay

## 2019-09-13 ENCOUNTER — Inpatient Hospital Stay: Payer: Medicare Other

## 2019-09-13 DIAGNOSIS — J449 Chronic obstructive pulmonary disease, unspecified: Secondary | ICD-10-CM | POA: Diagnosis not present

## 2019-09-13 DIAGNOSIS — R569 Unspecified convulsions: Secondary | ICD-10-CM | POA: Diagnosis not present

## 2019-09-13 LAB — CBC
HCT: 38.8 % — ABNORMAL LOW (ref 39.0–52.0)
Hemoglobin: 13.5 g/dL (ref 13.0–17.0)
MCH: 32.8 pg (ref 26.0–34.0)
MCHC: 34.8 g/dL (ref 30.0–36.0)
MCV: 94.4 fL (ref 80.0–100.0)
Platelets: 224 10*3/uL (ref 150–400)
RBC: 4.11 MIL/uL — ABNORMAL LOW (ref 4.22–5.81)
RDW: 11.6 % (ref 11.5–15.5)
WBC: 7.6 10*3/uL (ref 4.0–10.5)
nRBC: 0 % (ref 0.0–0.2)

## 2019-09-13 LAB — URINALYSIS, COMPLETE (UACMP) WITH MICROSCOPIC
Bacteria, UA: NONE SEEN
Bilirubin Urine: NEGATIVE
Glucose, UA: NEGATIVE mg/dL
Hgb urine dipstick: NEGATIVE
Ketones, ur: 5 mg/dL — AB
Leukocytes,Ua: NEGATIVE
Nitrite: NEGATIVE
Protein, ur: NEGATIVE mg/dL
Specific Gravity, Urine: 1.02 (ref 1.005–1.030)
pH: 7 (ref 5.0–8.0)

## 2019-09-13 IMAGING — CR DG CHEST 2V
1 series · 2 of 2 positions shown · non-contrast
Comparison: [DATE].

CLINICAL DATA: Fever.

EXAM:
CHEST - 2 VIEW

[Series 1: dg chest 2 view · 0.14mm/px · 2 of 2 slices shown]
[im 1/2]
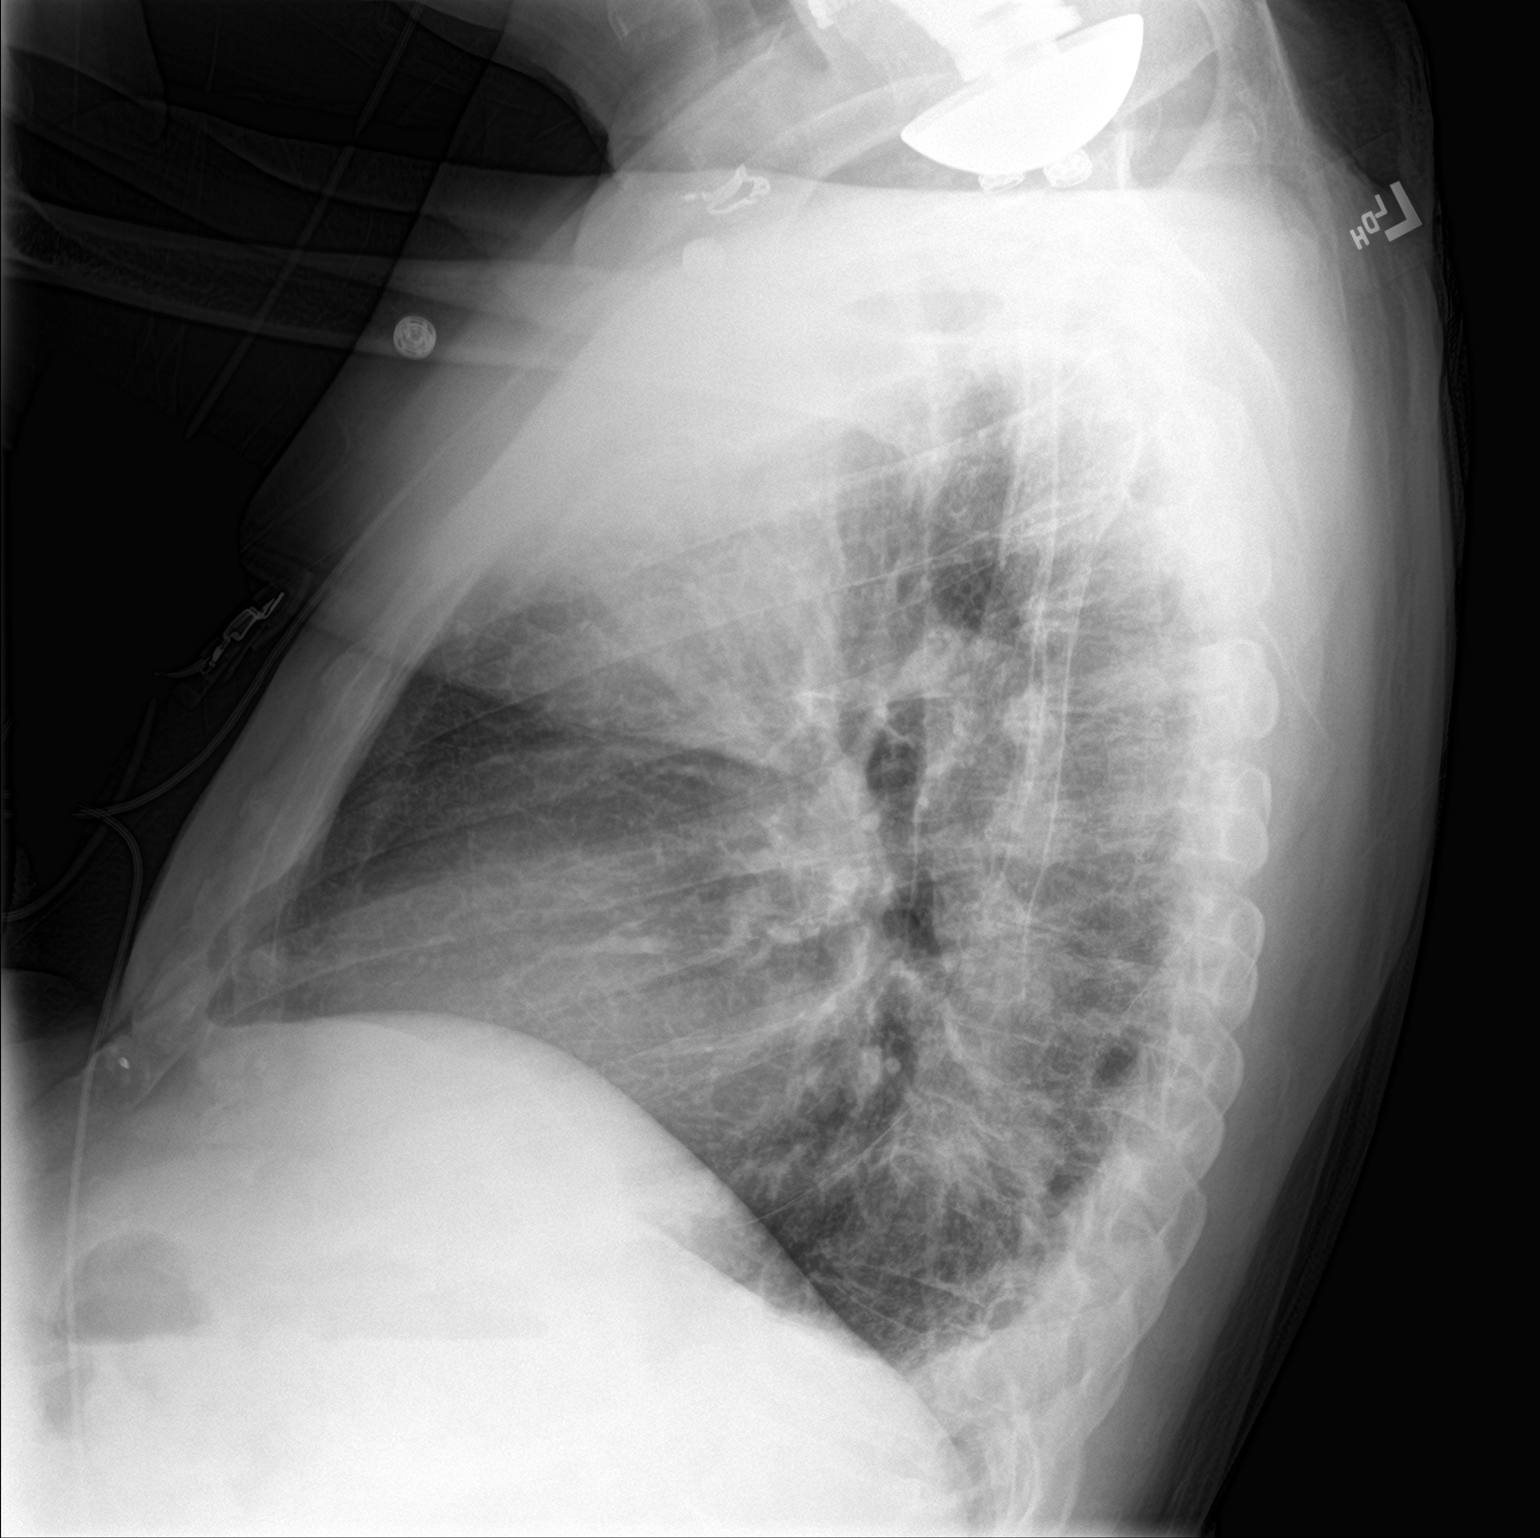
[im 2/2]
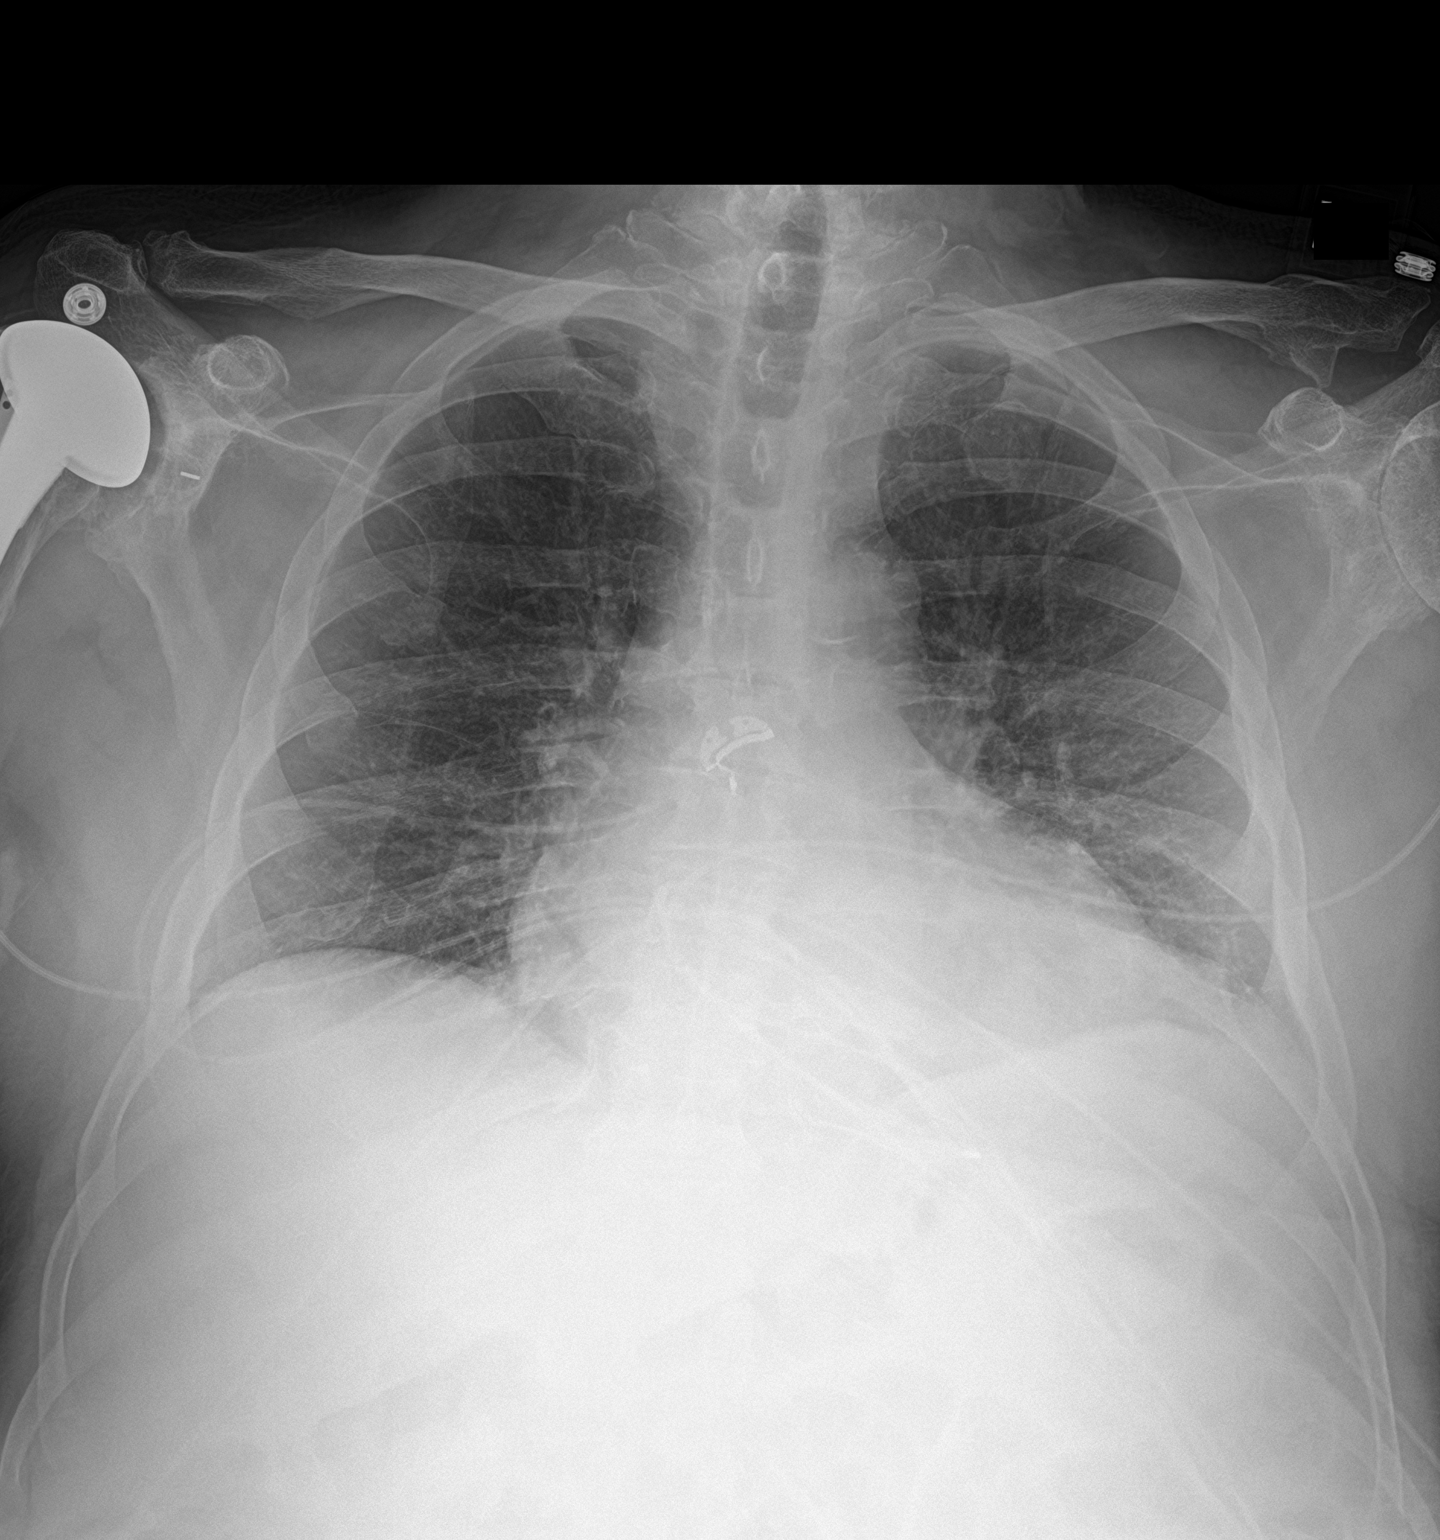

[2 of 2 positions shown; findings below may reference images not displayed]

FINDINGS: Mediastinum and hilar structures normal. Cardiomegaly. No pulmonary
venous congestion. Low lung volumes. Mild bilateral interstitial
prominence. Pneumonitis could present in this fashion. Tiny left
pleural effusion cannot be excluded. No pneumothorax. Prior cervical
spine fusion. Right shoulder replacement. Loosening of the right
IMPRESSION: 1.  Cardiomegaly.  No pulmonary venous congestion.

2. Low lung volumes. Mild bilateral interstitial prominence.
Pneumonitis could present this fashion. Tiny left pleural effusion
cannot be excluded.

## 2019-09-13 NOTE — Progress Notes (Signed)
PT Cancellation Note  Patient Details Name: Ian Gill MRN: [id number] DOB: May 13, 1945   Cancelled Treatment:    Reason Eval/Treat Not Completed: Other (comment) (Pt off the  floor at this time. PT to re-attempt as able)  Olga Coaster PT, DPT 11:20 AM,09/13/19

## 2019-09-13 NOTE — Progress Notes (Addendum)
PROGRESS NOTE    BAYLER GEHRIG  RKY:[id number] DOB: [birth year] DOA: 09/08/2019 PCP: Barbette Reichmann, MD   Brief Narrative: 74 year old with past medical history significant for seizure, hypertension, hyperlipidemia, COPD, stroke, depression with anxiety, alcohol use who presents with altered mental status and seizures. Patient Recently discharged from skilled nursing facility to independent living facility after he had a right ankle fracture.  Day of admission patient was noted to be confused.  His son think patient probably had a seizure episode.  Patient had a fall and has bruises.   Evaluation in the ED patient valproic acid was subtherapeutic at 11.  18, blood pressure elevated 193/79.  Chest x-ray show vascular crowding with low lung volumes.  CT head negative for acute intracranial abnormalities.  Patient was admitted with altered mental status thought to be related to seizure episode versus hypertensive encephalopathy, mild elevation ammonia level.  Patient mental status continues to fluctuate will get EEG per neurology recommendation.  Patient had a low-grade fever 8/16.  This could be related to atelectasis.  Chest x-ray showed finding with interstitial marking related to pneumonitis.  No leukocytosis. Checking UA>   Assessment & Plan:   Principal Problem:   Seizure (HCC) Active Problems:   Alcohol use   COPD (chronic obstructive pulmonary disease) (HCC)   Depression   Stroke (HCC)   HLD (hyperlipidemia)   GERD (gastroesophageal reflux disease)   HTN (hypertension)   Acute metabolic encephalopathy   Hypertensive urgency   Fall  1-Seizure and acute metabolic encephalopathy: -CT head negative for acute intracranial abnormality. -Patient was restarted on IV Keppra, Depakote and Lamictal -Patient is alert and oriented x3, per son patient has improved but not back to baseline. -MRI: Punctate left corona radiata DWI hyperintensities may reflect tiny subacute infarcts  versus artifact. Remote small right frontal microhemorrhage. -neurology consulted, unlikely MRI finding are related to patient confusion and or stroke.  -Plan for EEG. Discussed with Neurology we dont have to wait for EEG results to proceed to sx.  -Depakote dose reduce.  -Ammonia level mildly elevated. Started  low dose lactulose. Follow level. LFT normal. Depakote dose decreased.  -Ammonia down to normal level.  -per neurology component of Hypertensive encephalopathy  -EEG if mental status fluctuates. Became confuse last night (8/16). EEG has been ordered.  -Alert and conversant.   2-Alcohol use: Patient has been sober for the last 6 weeks.  3-COPD: Continue with bronchodilators.  4-Depression: Continue with medications  5-History of stroke continue with Plavix and Lipitor.  6-GERD: Continue with Pepcid.   7-Hypertension: Continue with IV hydralazine as needed Continue with amlodipine lisinopril propanolol BP improving.   Low grade fever;  Incentive spirometry. Chest x ray: Bilateral interstitial prominence. ? Pneumonitis.  No leukocytosis.  UA ordered.   Fall: PT OT eval Ribs x ray; number 10 rib fracture Incentive hysterometry Fell last night. CT head no acute abnormalities. Fall precaution.   Right ankle fracture: Discussed with podiatry, x-ray looks worse recommendation is for surgical intervention.  plan to proceed on Sunday Mildly displaced distal fibular fracture. 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  Close reduction dislocated second toe PIP J. 8/15. Dressing  changes 8/17. Clear by ortho for discharge.   Report of A flutter, A fib by telemetry; not consistent with these diagnosis.  Telemetry review by cardiology, likely artifact and PAC.  EKG sinus rhythm.   Estimated body mass index  is 32.08 kg/m as calculated from the following:   Height as of  this encounter: 5\' 11"  (1.803 m).   Weight as of this encounter: 104.3 kg.   DVT prophylaxis: Lovenox Code Status: DNR Family Communication: Discussed with son over phone 8/17 Disposition Plan:  Status is: Inpatient  Remains inpatient appropriate because:Hemodynamically unstable   Dispo: The patient is from: Home              Anticipated d/c is to: SNF              Anticipated d/c date is: 3 days              Patient currently is not medically stable to d/c.  SNF 8/18 if EEG negative and afebrile.        Consultants:   Podiatry   Procedures:   none  Antimicrobials:    Subjective: He is alert.  Events from last night notice. He hot confuse.  No new complaints.   Objective: Vitals:   09/13/19 0030 09/13/19 0334 09/13/19 0826 09/13/19 1202  BP:  (!) 158/68 (!) 176/71 128/71  Pulse:  70 67 65  Resp:  20 18 16   Temp: 99.5 F (37.5 C) 98.4 F (36.9 C) 98.1 F (36.7 C) 98.5 F (36.9 C)  TempSrc: Oral Oral Oral Oral  SpO2:  98% 96% 95%  Weight:      Height:        Intake/Output Summary (Last 24 hours) at 09/13/2019 1520 Last data filed at 09/13/2019 1230 Gross per 24 hour  Intake 1400 ml  Output 800 ml  Net 600 ml   Filed Weights   09/07/19 1438  Weight: 104.3 kg    Examination:  General exam: NAD Respiratory system: CTA Cardiovascular system: S 1, S 2 RRR Gastrointestinal system: BS present, soft, nt Central nervous system: alert, follows command Extremities: RLE immobilizer.   Data Reviewed: I have personally reviewed following labs and imaging studies  CBC: Recent Labs  Lab 09/09/19 0434 09/10/19 0614 09/11/19 0730 09/12/19 0517 09/13/19 1231  WBC 5.9 6.3 5.0 7.5 7.6  HGB 13.0 13.3 13.8 13.5 13.5  HCT 35.9* 38.9* 38.7* 38.4* 38.8*  MCV 93.5 95.8 93.5 93.2 94.4  PLT 181 178 197 191 224   Basic Metabolic Panel: Recent Labs  Lab 09/07/19 1445 09/09/19 0434 09/10/19 0614 09/11/19 0730 09/12/19 0517  NA 140 136 138 139 138  K  4.1 3.7 4.1 3.5 3.8  CL 101 101 100 102 99  CO2 24 26 30 26 29   GLUCOSE 116* 99 99 91 104*  BUN 12 14 12 10 11   CREATININE 0.85 0.78 0.76 0.74 0.85  CALCIUM 9.6 8.7* 9.1 9.3 9.1   GFR: Estimated Creatinine Clearance: 92.3 mL/min (by C-G formula based on SCr of 0.85 mg/dL). Liver Function Tests: Recent Labs  Lab 09/07/19 1445  AST 70*  ALT 21  ALKPHOS 56  BILITOT 1.3*  PROT 7.2  ALBUMIN 4.5   No results for input(s): LIPASE, AMYLASE in the last 168 hours. Recent Labs  Lab 09/08/19 0429 09/10/19 1135 09/11/19 0730  AMMONIA 18 44* 21   Coagulation Profile: No results for input(s): INR, PROTIME in the last 168 hours. Cardiac Enzymes: No results for input(s): CKTOTAL, CKMB, CKMBINDEX, TROPONINI in the last 168 hours. BNP (last 3 results) No results for input(s): PROBNP in the last 8760 hours. HbA1C: Recent Labs    09/11/19 0730  HGBA1C 5.1   CBG: Recent Labs  Lab 09/11/19 1700  09/12/19 0816 09/12/19 1227  GLUCAP 102* 75 96   Lipid Profile: Recent Labs    09/11/19 0730  CHOL 110  HDL 37*  LDLCALC 59  TRIG 68  CHOLHDL 3.0   Thyroid Function Tests: No results for input(s): TSH, T4TOTAL, FREET4, T3FREE, THYROIDAB in the last 72 hours. Anemia Panel: No results for input(s): VITAMINB12, FOLATE, FERRITIN, TIBC, IRON, RETICCTPCT in the last 72 hours. Sepsis Labs: No results for input(s): PROCALCITON, LATICACIDVEN in the last 168 hours.  Recent Results (from the past 240 hour(s))  SARS Coronavirus 2 by RT PCR (hospital order, performed in [facility] lab) Nasopharyngeal Nasopharyngeal Swab     Status: None   Collection Time: 09/08/19  7:36 AM   Specimen: Nasopharyngeal Swab  Result Value Ref Range Status   SARS Coronavirus 2 NEGATIVE NEGATIVE Final    Comment: (NOTE) SARS-CoV-2 target nucleic acids are NOT DETECTED.  The SARS-CoV-2 RNA is generally detectable in upper and lower respiratory specimens during the acute phase of infection. The  lowest concentration of SARS-CoV-2 viral copies this assay can detect is 250 copies / mL. A negative result does not preclude SARS-CoV-2 infection and should not be used as the sole basis for treatment or other patient management decisions.  A negative result may occur with improper specimen collection / handling, submission of specimen other than nasopharyngeal swab, presence of viral mutation(s) within the areas targeted by this assay, and inadequate number of viral copies (<250 copies / mL). A negative result must be combined with clinical observations, patient history, and epidemiological information.  Fact Sheet for Patients:   BoilerBrush.com.cyhttps://www.fda.gov/media/136312/download  Fact Sheet for Healthcare Providers: https://pope.com/https://www.fda.gov/media/136313/download  This test is not yet approved or  cleared by the [place] FDA and has been authorized for detection and/or diagnosis of SARS-CoV-2 by FDA under an Emergency Use Authorization (EUA).  This EUA will remain in effect (meaning this test can be used) for the duration of the COVID-19 declaration under Section 564(b)(1) of the Act, 21 U.S.C. section 360bbb-3(b)(1), unless the authorization is terminated or revoked sooner.  Performed at [facility], [address] [place], [place] [address]   SARS CORONAVIRUS 2 (TAT 6-24 HRS) Nasopharyngeal Nasopharyngeal Swab     Status: None   Collection Time: 09/12/19  2:00 PM   Specimen: Nasopharyngeal Swab  Result Value Ref Range Status   SARS Coronavirus 2 NEGATIVE NEGATIVE Final    Comment: (NOTE) SARS-CoV-2 target nucleic acids are NOT DETECTED.  The SARS-CoV-2 RNA is generally detectable in upper and lower respiratory specimens during the acute phase of infection. Negative results do not preclude SARS-CoV-2 infection, do not rule out co-infections with other pathogens, and should not be used as the sole basis for treatment or other patient management decisions. Negative  results must be combined with clinical observations, patient history, and epidemiological information. The expected result is Negative.  Fact Sheet for Patients: HairSlick.nohttps://www.fda.gov/media/138098/download  Fact Sheet for Healthcare Providers: quierodirigir.comhttps://www.fda.gov/media/138095/download  This test is not yet approved or cleared by the [place] FDA and  has been authorized for detection and/or diagnosis of SARS-CoV-2 by FDA under an Emergency Use Authorization (EUA). This EUA will remain  in effect (meaning this test can be used) for the duration of the COVID-19 declaration under Se ction 564(b)(1) of the Act, 21 U.S.C. section 360bbb-3(b)(1), unless the authorization is terminated or revoked sooner.  Performed at [facility], [address] [place], [place] [address]  Radiology Studies: DG Chest 2 View  Result Date: 09/13/2019 CLINICAL DATA:  Fever. EXAM: CHEST - 2 VIEW COMPARISON:  09/08/2019. FINDINGS: Mediastinum and hilar structures normal. Cardiomegaly. No pulmonary venous congestion. Low lung volumes. Mild bilateral interstitial prominence. Pneumonitis could present in this fashion. Tiny left pleural effusion cannot be excluded. No pneumothorax. Prior cervical spine fusion. Right shoulder replacement. Loosening of the right IMPRESSION: 1.  Cardiomegaly.  No pulmonary venous congestion. 2. Low lung volumes. Mild bilateral interstitial prominence. Pneumonitis could present this fashion. Tiny left pleural effusion cannot be excluded. Electronically Signed   By: Maisie Fus  Register   On: 09/13/2019 11:23        Scheduled Meds: .  stroke: mapping our early stages of recovery book   Does not apply Once  . amLODipine  10 mg Oral Daily  . atorvastatin  20 mg Oral Daily  . cholecalciferol  5,000 Units Oral Daily  . clopidogrel  75 mg Oral Daily  . divalproex  250 mg Oral Q12H  . enoxaparin (LOVENOX) injection  40 mg Subcutaneous Q24H  . folic acid  1 mg Oral  Daily  . lactulose  20 g Oral Daily  . lamoTRIgine  150 mg Oral BID  . lisinopril  20 mg Oral Daily  . multivitamin with minerals  1 tablet Oral Daily  . nystatin  5 mL Oral QID  . omega-3 acid ethyl esters  1,000 mg Oral Daily  . propranolol  20 mg Oral BID  . sertraline  200 mg Oral Daily  . thiamine  100 mg Oral Daily   Or  . thiamine  100 mg Intravenous Daily   Continuous Infusions: . levETIRAcetam 1,500 mg (09/13/19 1405)     LOS: 5 days    Time spent: 35 minutes.     Alba Cory, MD [facility]   If 7PM-7AM, please contact night-coverage [email or web address]  09/13/2019, 3:20 PM

## 2019-09-13 NOTE — Procedures (Signed)
Patient Name: Ian Gill  MRN: [id number]  Epilepsy Attending: Charlsie Quest  Referring Physician/Provider: Dr Hartley Barefoot Date: 8/1/72021 Duration: 24.33 mins  Patient history: 74yo M presented with ams and seizure. EEG to evaluate for seizure.   Level of alertness: Awake, asleep, comatose, lethargic  AEDs during EEG study: VPA, LTG, LEV  Technical aspects: This EEG study was done with scalp electrodes positioned according to the 10-20 International system of electrode placement. Electrical activity was acquired at a sampling rate of 500Hz  and reviewed with a high frequency filter of 70Hz  and a low frequency filter of 1Hz . EEG data were recorded continuously and digitally stored.   Description: The posterior dominant rhythm consists of 8 Hz activity of moderate voltage (25-35 uV) seen predominantly in posterior head regions, symmetric and reactive to eye opening and eye closing.  Sleep was characterized by vertex waves, sleep spindles (12 to 14 Hz), maximal frontocentral region. Physiologic photic driving was not seen during photic stimulation.  Hyperventilation was not performed.     IMPRESSION: This study is within normal limits. No seizures or epileptiform discharges were seen throughout the recording.  [name] 

## 2019-09-13 NOTE — Progress Notes (Signed)
eeg completed ° °

## 2019-09-13 NOTE — Progress Notes (Signed)
Patient found restless in bed. He had removed the coban splint that was keeping his second toe stable. When questioned regarding removing the dressing he was confused and stated that he did not know what the dressing was. Second to restablized with coban. Patient re-educated on maintaining this dressing. No further interventions at this time.

## 2019-09-13 NOTE — Progress Notes (Signed)
PT Cancellation Note  Patient Details Name: Ian Gill MRN: [id number] DOB: 1945/02/01   Cancelled Treatment:    Reason Eval/Treat Not Completed: Other (comment) (Telemetry monitor in hallway checked prior to PT, it reported pt was in afib. Lines/leads checked by PT and RN. MD and primary RN notified of pt status.)   Olga Coaster PT, DPT 8:55 AM,09/13/19

## 2019-09-13 NOTE — Procedures (Deleted)
Pt went to OR today-gen anesthesia.  EEG can be done but less than 24 hrs after gen anes might not be as yielding.

## 2019-09-13 NOTE — Progress Notes (Signed)
Daily Progress Note   Subjective  - 2 Days Post-Op  F/u right ankle fracture.  Doing well.    Objective Vitals:   09/13/19 0030 09/13/19 0334 09/13/19 0826 09/13/19 1202  BP:  (!) 158/68 (!) 176/71 128/71  Pulse:  70 67 65  Resp:  20 18 16   Temp: 99.5 F (37.5 C) 98.4 F (36.9 C) 98.1 F (36.7 C) 98.5 F (36.9 C)  TempSrc: Oral Oral Oral Oral  SpO2:  98% 96% 95%  Weight:      Height:        Physical Exam: Dressing changed.  Wound stable.  Laboratory CBC    Component Value Date/Time   WBC 7.5 09/12/2019 0517   HGB 13.5 09/12/2019 0517   HGB 14.0 02/15/2014 2016   HCT 38.4 (L) 09/12/2019 0517   HCT 42.2 02/15/2014 2016   PLT 191 09/12/2019 0517   PLT 160 02/15/2014 2016    BMET    Component Value Date/Time   NA 138 09/12/2019 0517   NA 143 02/15/2014 2016   K 3.8 09/12/2019 0517   K 4.3 02/15/2014 2016   CL 99 09/12/2019 0517   CL 110 (H) 02/15/2014 2016   CO2 29 09/12/2019 0517   CO2 28 02/15/2014 2016   GLUCOSE 104 (H) 09/12/2019 0517   GLUCOSE 103 (H) 02/15/2014 2016   BUN 11 09/12/2019 0517   BUN 17 02/15/2014 2016   CREATININE 0.85 09/12/2019 0517   CREATININE 1.09 02/15/2014 2016   CALCIUM 9.1 09/12/2019 0517   CALCIUM 8.9 02/15/2014 2016   GFRNONAA >60 09/12/2019 0517   GFRNONAA >60 02/15/2014 2016   GFRAA >60 09/12/2019 0517   GFRAA >60 02/15/2014 2016    Assessment/Planning: S/p ORIf right ankle and closed reduction left 2nd toe   NWB to right ankle.  Dressing changed today.  Leave intact. Will change in office next week.  WBAT on left foot   OK from podiatry standpoint to d/c.  F/U in outpt clinic in 1 week.  2017 A  09/13/2019, 12:30 PM

## 2019-09-13 NOTE — Progress Notes (Addendum)
Physical Therapy Treatment Patient Details Name: Ian Gill MRN: [id number] DOB: Jul 13, 1945 Today's Date: 09/13/2019    History of Present Illness Ian Gill is a 74 y.o. male s/p R anke ORIF due to changes in R ankle fracture, initially presented to ED for AMS and seizures. PMH  of seizure, hypertension, hyperlipidemia, COPD, stroke, GERD, depression, anxiety, Parkinson's disease, alcohol abuse, who presents with altered mental status and seizure.    PT Comments    Of note, PT spoke with RN, MD and central telemetry prior to session due to reported afib/irregular HR on monitor. MD stated it was okay to work with patient, and central telemetry reported sinus rhythm with a signifcant amount of PACs. HR monitored throughout, 70-80s with activity, reported irregular HR. Pt alert, oriented to name, able to report "august 18th 2022", with cueing able to report correct date, wrong hospital, stated he was in the hospital for seizures and had surgery on his foot. Fluctuating situational awareness noted.   Several supine exercises performed with tactile cueing. The patient was able to sit EOB with modA and modA to reposition on EOB to obtain balance. Sit to stand with minAx2 and RW. The patient was able to take several steps forward, but unable to laterally hop safely. Often displayed increased L knee flexion, but able to maintain NWB well. Unsteadiness noted throughout, though improved from last session. Pt in bed, all needs in reach at end of session.The patient would benefit from further skilled PT intervention to continue to progress towards goals. Recommendation remains appropriate.      Follow Up Recommendations  SNF     Equipment Recommendations  None recommended by PT;Other (comment)    Recommendations for Other Services       Precautions / Restrictions Precautions Precautions: Fall;Other (comment) Precaution Comments: seizure Required Braces or Orthoses: Other Brace Other  Brace: ortho boot Restrictions Weight Bearing Restrictions: Yes RLE Weight Bearing: Non weight bearing    Mobility  Bed Mobility Overal bed mobility: Needs Assistance Bed Mobility: Sit to Supine;Supine to Sit     Supine to sit: Mod assist Sit to supine: Mod assist;+2 for safety/equipment   General bed mobility comments: Assist for RLE mgmt and trunk control  Transfers Overall transfer level: Needs assistance Equipment used: Rolling walker (2 wheeled) Transfers: Sit to/from Stand Sit to Stand: Min assist;+2 safety/equipment         General transfer comment: Pt able to hop forward but unable to hop laterally safely. further mobility held.  Ambulation/Gait                 Stairs             Wheelchair Mobility    Modified Rankin (Stroke Patients Only)       Balance Overall balance assessment: Needs assistance Sitting-balance support: No upper extremity supported;Feet supported Sitting balance-Leahy Scale: Fair     Standing balance support: Bilateral upper extremity supported Standing balance-Leahy Scale: Poor                              Cognition Arousal/Alertness: Awake/alert Behavior During Therapy: WFL for tasks assessed/performed Overall Cognitive Status: No family/caregiver present to determine baseline cognitive functioning                                 General Comments: flucuating mental status noted, oriented to person and  situation intermittently, re-oriented to place/date weight bearing status with pt able to repeat back education.      Exercises General Exercises - Lower Extremity Heel Slides: AROM;Strengthening;Both;10 reps Hip ABduction/ADduction: AROM;Strengthening;Both;10 reps    General Comments        Pertinent Vitals/Pain Pain Assessment: Faces Faces Pain Scale: Hurts little more Pain Location: with R foot/ankle movement    Home Living                      Prior Function             PT Goals (current goals can now be found in the care plan section) Progress towards PT goals: Progressing toward goals    Frequency    7X/week      PT Plan Current plan remains appropriate    Co-evaluation              AM-PAC PT "6 Clicks" Mobility   Outcome Measure  Help needed turning from your back to your side while in a flat bed without using bedrails?: A Lot Help needed moving from lying on your back to sitting on the side of a flat bed without using bedrails?: A Lot Help needed moving to and from a bed to a chair (including a wheelchair)?: A Lot Help needed standing up from a chair using your arms (e.g., wheelchair or bedside chair)?: A Lot Help needed to walk in hospital room?: Total Help needed climbing 3-5 steps with a railing? : Total 6 Click Score: 10    End of Session Equipment Utilized During Treatment: Gait belt Activity Tolerance: Patient tolerated treatment well Patient left: in bed;with call bell/phone within reach;with nursing/sitter in room Nurse Communication: Mobility status PT Visit Diagnosis: Difficulty in walking, not elsewhere classified (R26.2);Other abnormalities of gait and mobility (R26.89);History of falling (Z91.81);Pain Pain - Right/Left: Left (and right) Pain - part of body: Ankle and joints of foot (R foot and L flank)     Time: 5885-0277 PT Time Calculation (min) (ACUTE ONLY): 28 min  Charges:  $Therapeutic Exercise: 23-37 mins                    Olga Coaster PT, DPT 3:21 PM,09/13/19

## 2019-09-14 DIAGNOSIS — Z7289 Other problems related to lifestyle: Secondary | ICD-10-CM | POA: Diagnosis not present

## 2019-09-14 DIAGNOSIS — J449 Chronic obstructive pulmonary disease, unspecified: Secondary | ICD-10-CM | POA: Diagnosis not present

## 2019-09-14 MED ORDER — LACTULOSE 10 GM/15ML PO SOLN: 20.0000 g | Freq: Every day | ORAL | 0 refills | Status: DC

## 2019-09-14 MED ORDER — THIAMINE HCL 100 MG PO TABS: 100.0000 mg | ORAL_TABLET | Freq: Every day | ORAL | Status: DC

## 2019-09-14 MED ORDER — BUTALBITAL-APAP-CAFFEINE 50-325-40 MG PO TABS
1.0000 | ORAL_TABLET | ORAL | Status: DC | PRN
  Administered 2019-09-14: 10:00:00 1 via ORAL
  Filled 2019-09-14 (×2): qty 1

## 2019-09-14 MED ORDER — DIVALPROEX SODIUM 125 MG PO CSDR: 250.0000 mg | DELAYED_RELEASE_CAPSULE | Freq: Two times a day (BID) | ORAL | Status: DC

## 2019-09-14 MED ORDER — ACETAMINOPHEN 325 MG PO TABS: 650.0000 mg | ORAL_TABLET | Freq: Four times a day (QID) | ORAL | Status: DC | PRN

## 2019-09-14 MED ORDER — FOLIC ACID 1 MG PO TABS: 1.0000 mg | ORAL_TABLET | Freq: Every day | ORAL | Status: AC

## 2019-09-14 NOTE — Discharge Summary (Addendum)
Physician Discharge Summary  Ian Gill UJW:[id number] DOB: 06-12-1945 DOA: 09/08/2019  PCP: Barbette Reichmann, MD  Admit date: 09/08/2019 Discharge date: 09/14/2019  Time spent: 50* minutes  Recommendations for Outpatient Follow-up:  1. Follow-up podiatry in 1 week 2. Follow-up neurology in 4 weeks 3. Nonweightbearing right lower extremity  Discharge Diagnoses:  Principal Problem:   Seizure (HCC) Active Problems: Ankle fracture   Alcohol use   COPD (chronic obstructive pulmonary disease) (HCC)   Depression   Stroke (HCC)   HLD (hyperlipidemia)   GERD (gastroesophageal reflux disease)   HTN (hypertension)   Acute metabolic encephalopathy   Hypertensive urgency   Fall   Discharge Condition: Stable  Diet recommendation: Heart healthy diet  Filed Weights   09/07/19 1438  Weight: 104.3 kg    History of present illness:  74 year old male with a past medical history of seizure disorder, hypertension, hyperlipidemia, COPD, stroke, depression anxiety, alcohol abuse presented with altered mental status and seizure.  Patient was recently discharged from skilled nursing facility to independent facility after he had ankle fracture.  On the day of admission patient was found to be confused also was brought to hospital for possible seizure episode.  In the ED evaluation showed valproic acid was subtherapeutic at 11.   Hospital Course:  Seizure and acute metabolic encephalopathy-CT head was negative for acute intracranial abnormality.  Patient was started back on Keppra, Depakote and Lipitor.  At this time patient is mentally back to his baseline.  He is alert oriented x3.  MRI showed Punctate left corona radiata DWI hyperintensities may reflect tiny subacute infarcts versus artifact. Remote small right frontal microhemorrhage -neurology was consulted, unlikely MRI finding are related to patient confusion and or stroke.  EEG was done which did not show any epileptogenic focus.  Ammonia  was mildly increased.  Patient started on low-dose lactulose.  Patient will be discharged on current medications and follow-up with neurology in 4 weeks.  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    Gross reduction of dislocated second toe PIP on 09/11/2019.  Patient to follow-up with podiatry in 1 week.  Alcohol use-has been sober for past 6 weeks.  Continue thiamine 100 mg p.o. daily.  COPD-continue bronchodilators.  Depression-continue home medications  History of stroke-continue Plavix, albuterol  Hypertension-continue amlodipine, lisinopril, propranolol.   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  Consultations: Podiatry Neurology  Discharge Exam: Vitals:   09/14/19 0748 09/14/19 1222  BP: (!) 150/80 (!) 102/57  Pulse: 69 60  Resp: 18 18  Temp: 97.9 F (36.6 C) 98.4 F (36.9 C)  SpO2: 98% 95%    General: Appears in no acute distress Cardiovascular: S1-S2, regular Respiratory: Clear to auscultation bilaterally  Discharge Instructions   Discharge Instructions    Ambulatory referral to Neurology   Complete by: As directed    An appointment is requested in approximately: 4 weeks   Diet - low sodium heart healthy   Complete by: As directed    Increase activity slowly   Complete by: As directed    No wound care   Complete by: As directed      Allergies as of 09/14/2019      Reactions   Bupropion Other (See Comments)   Seizures Seizure seizures Seizure   Dilantin [phenytoin Sodium Extended]    Past issues with toxicity per pt's son   Iodinated Diagnostic Agents Swelling  Medication List    STOP taking these medications   divalproex 250 MG DR tablet Commonly known as: DEPAKOTE Replaced by: divalproex 125 MG capsule   divalproex 500 MG DR tablet Commonly known as: DEPAKOTE     TAKE these medications    acetaminophen 325 MG tablet Commonly known as: TYLENOL Take 2 tablets (650 mg total) by mouth every 6 (six) hours as needed for mild pain (or Fever >/= 101).   amLODipine 10 MG tablet Commonly known as: NORVASC Take 1 tablet (10 mg total) by mouth daily.   atorvastatin 20 MG tablet Commonly known as: LIPITOR Take 1 tablet (20 mg total) by mouth daily.   Cholecalciferol 125 MCG (5000 UT) capsule Take 5,000 Units by mouth daily.   clopidogrel 75 MG tablet Commonly known as: PLAVIX Take 1 tablet (75 mg total) by mouth daily.   divalproex 125 MG capsule Commonly known as: DEPAKOTE SPRINKLE Take 2 capsules (250 mg total) by mouth every 12 (twelve) hours. Replaces: divalproex 250 MG DR tablet   folic acid 1 MG tablet Commonly known as: FOLVITE Take 1 tablet (1 mg total) by mouth daily. Start taking on: September 15, 2019   lactulose 10 GM/15ML solution Commonly known as: CHRONULAC Take 30 mLs (20 g total) by mouth daily. Start taking on: September 15, 2019   lamoTRIgine 150 MG tablet Commonly known as: LAMICTAL Take 1 tablet (150 mg total) by mouth 2 (two) times daily.   levETIRAcetam 500 MG tablet Commonly known as: KEPPRA Take 3 tablets (1,500 mg total) by mouth 2 (two) times daily.   lisinopril 20 MG tablet Commonly known as: ZESTRIL Take 1 tablet (20 mg total) by mouth daily.   Melatonin 10 MG Tabs Take 1 tablet by mouth at bedtime.   Multi-Vitamin tablet Take 1 tablet by mouth daily.   propranolol 20 MG tablet Commonly known as: INDERAL Take 20 mg by mouth 2 (two) times daily.   RA Fish Oil 1000 MG Caps Take 1 capsule by mouth daily.   sertraline 100 MG tablet Commonly known as: ZOLOFT Take 200 mg by mouth daily.   thiamine 100 MG tablet Take 1 tablet (100 mg total) by mouth daily. Start taking on: September 15, 2019      Allergies  Allergen Reactions  . Bupropion Other (See Comments)    Seizures  Seizure seizures Seizure   . Dilantin [Phenytoin  Sodium Extended]     Past issues with toxicity per pt's son  . Iodinated Diagnostic Agents Swelling    Follow-up Information    Gwyneth Revels, DPM Follow up in 1 week(s).   Specialty: Podiatry Contact information: [address] [place] [address] [phone]                The results of significant diagnostics from this hospitalization (including imaging, microbiology, ancillary and laboratory) are listed below for reference.    Significant Diagnostic Studies: EEG  Result Date: 09/13/2019 Charlsie Quest, MD     09/13/2019  5:54 PM Patient Name: Ian Gill MRN: [id number] Epilepsy Attending: Charlsie Quest Referring Physician/Provider: Dr Hartley Barefoot Date: 8/1/72021 Duration: 24.33 mins Patient history: 74yo M presented with ams and seizure. EEG to evaluate for seizure. Level of alertness: Awake, asleep, comatose, lethargic AEDs during EEG study: VPA, LTG, LEV Technical aspects: This EEG study was done with scalp electrodes positioned according to the 10-20 International system of electrode placement. Electrical activity was acquired at a sampling rate of 500Hz  and reviewed  with a high frequency filter of 70Hz  and a low frequency filter of 1Hz . EEG data were recorded continuously and digitally stored. Description: The posterior dominant rhythm consists of 8 Hz activity of moderate voltage (25-35 uV) seen predominantly in posterior head regions, symmetric and reactive to eye opening and eye closing.  Sleep was characterized by vertex waves, sleep spindles (12 to 14 Hz), maximal frontocentral region. Physiologic photic driving was not seen during photic stimulation.  Hyperventilation was not performed.   IMPRESSION: This study is within normal limits. No seizures or epileptiform discharges were seen throughout the recording.   DG Chest 2 View  Result Date: 09/13/2019 CLINICAL DATA:  Fever. EXAM: CHEST - 2 VIEW COMPARISON:  09/08/2019. FINDINGS:  Mediastinum and hilar structures normal. Cardiomegaly. No pulmonary venous congestion. Low lung volumes. Mild bilateral interstitial prominence. Pneumonitis could present in this fashion. Tiny left pleural effusion cannot be excluded. No pneumothorax. Prior cervical spine fusion. Right shoulder replacement. Loosening of the right IMPRESSION: 1.  Cardiomegaly.  No pulmonary venous congestion. 2. Low lung volumes. Mild bilateral interstitial prominence. Pneumonitis could present this fashion. Tiny left pleural effusion cannot be excluded. Electronically Signed   By: 09/15/2019  Register   On: 09/13/2019 11:23   DG Chest 2 View  Result Date: 09/06/2019 CLINICAL DATA:  Chest pain.  Recent fall EXAM: CHEST - 2 VIEW COMPARISON:  May 30, 2019 FINDINGS: There is mild lateral left base atelectasis. Lungs elsewhere are clear. Heart size and pulmonary vascularity are normal. No adenopathy. 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 No pneumothorax. Exostosis along the inferior left clavicle laterally is stable. IMPRESSION: Mild left base atelectasis. Lungs elsewhere clear. Cardiac silhouette within normal limits. Electronically Signed   By: 11/06/2019 III M.D.   On: 09/06/2019 08:30   DG Ribs Unilateral Left  Result Date: 09/09/2019 CLINICAL DATA:  Frequent falls at home. History of seizures and Parkinson's disease. Pain under the left breast. EXAM: LEFT RIBS - 2 VIEW COMPARISON:  09/08/2019 FINDINGS: Acute, nondisplaced left posterior tenth rib fracture. No other acute fractures. No bone lesions. IMPRESSION: Nondisplaced, left posterior tenth rib fracture. Electronically Signed   By: 09/11/2019 M.D.   On: 09/09/2019 12:26   DG Ankle Complete Right  Result Date: 09/09/2019 CLINICAL DATA:  09/11/2019 2-3 weeks ago. Persistent pain and swelling. EXAM: RIGHT ANKLE - COMPLETE 3+ VIEW COMPARISON:  None. FINDINGS: There is a mildly displaced oblique fracture involving the distal fibula at and above the level of  the ankle mortise. The tibia is intact. No fracture of the medial malleolus. Mild ankle joint degenerative changes. Ankle mortise is maintained. The mid and hindfoot bony structures are intact. Extensive soft tissue swelling mainly around the lateral aspect of the ankle. IMPRESSION: Mildly displaced distal fibular fracture. Electronically Signed   By: 09/11/2019 M.D.   On: 09/09/2019 11:14   CT HEAD WO CONTRAST  Result Date: 09/10/2019 CLINICAL DATA:  Altered mental status and seizures. EXAM: CT HEAD WITHOUT CONTRAST TECHNIQUE: Contiguous axial images were obtained from the base of the skull through the vertex without intravenous contrast. COMPARISON:  September 08, 2019 FINDINGS: Brain: There is mild cerebral atrophy with widening of the extra-axial spaces and ventricular dilatation. There are areas of decreased attenuation within the white matter tracts of the supratentorial brain, consistent with microvascular disease changes. Vascular: No hyperdense vessel or unexpected calcification. Skull: Normal. Negative for fracture or focal lesion. Sinuses/Orbits: No acute finding. Other: None. IMPRESSION: 1.  Generalized cerebral atrophy. 2. No acute intracranial abnormality. Electronically Signed   By: Aram Candela M.D.   On: 09/10/2019 21:43   CT Head Wo Contrast  Result Date: 09/08/2019 CLINICAL DATA:  Altered mental status.  Decreased responsiveness. EXAM: CT HEAD WITHOUT CONTRAST TECHNIQUE: Contiguous axial images were obtained from the base of the skull through the vertex without intravenous contrast. COMPARISON:  CT head without contrast 05/30/2019 FINDINGS: Brain: Mild atrophy and white matter changes are stable, likely within normal limits for age. No acute infarct, hemorrhage, or mass lesion is present. The ventricles are of normal size. No significant extraaxial fluid collection is present. The brainstem and cerebellum are within normal limits. Vascular: Atherosclerotic calcifications are present  within the cavernous internal carotid arteries bilaterally. Skull: Calvarium is intact. No focal lytic or blastic lesions are present. Asymmetric right parietal scalp soft tissue swelling is present without underlying fracture or foreign body. Sinuses/Orbits: The paranasal sinuses and mastoid air cells are clear. The globes and orbits are within normal limits. IMPRESSION: 1. Asymmetric right parietal scalp soft tissue swelling without underlying fracture or foreign body. Question trauma. 2. Stable atrophy and white matter disease, likely within normal limits for age. 3. Atherosclerosis. Electronically Signed   By: Marin Roberts M.D.   On: 09/08/2019 05:38   MR ANGIO HEAD WO CONTRAST  Result Date: 09/10/2019 CLINICAL DATA:  Stroke, follow-up EXAM: MRA HEAD WITHOUT CONTRAST TECHNIQUE: Angiographic images of the Circle of Willis were obtained using MRA technique without intravenous contrast. COMPARISON:  None. FINDINGS: Motion artifact is present intracranial internal carotid arteries are patent. Middle and anterior cerebral arteries are patent. Intracranial vertebral arteries are partially included but patent. Basilar and posterior cerebral arteries are patent. Bilateral posterior communicating arteries are present. There is no significant stenosis or aneurysm. IMPRESSION: No proximal intracranial vessel occlusion or significant stenosis. Electronically Signed   By: Guadlupe Spanish M.D.   On: 09/10/2019 13:18   MR BRAIN WO CONTRAST  Result Date: 09/09/2019 CLINICAL DATA:  Delirium EXAM: MRI HEAD WITHOUT CONTRAST TECHNIQUE: Multiplanar, multiecho pulse sequences of the brain and surrounding structures were obtained without intravenous contrast. COMPARISON:  09/08/2019 head CT and prior.  01/19/1999 MRI head. FINDINGS: Please note that motion artifact limits evaluation. Brain: Few scattered T2 hyperintense foci involving the periventricular and subcortical white matter are nonspecific however commonly  associated with chronic microvascular ischemic changes. Punctate left corona radiata hyperdense foci on DWI imaging (5:30, 31) may reflect tiny subacute infarcts versus artifact. Sequela of remote right frontal microhemorrhage. No midline shift, ventriculomegaly or extra-axial fluid collection. No mass lesion. Mild cerebral atrophy with ex vacuo dilatation. The bilateral hippocampi are symmetric demonstrating normal morphology and signal intensity. Vascular: Normal flow voids. Skull and upper cervical spine: Normal marrow signal. Sinuses/Orbits: Normal orbits. Minimal left maxillary sinus mucosal thickening. No mastoid effusion. Other: None. IMPRESSION: Punctate left corona radiata DWI hyperintensities may reflect tiny subacute infarcts versus artifact. Remote small right frontal microhemorrhage. Mild cerebral atrophy. Minimal chronic microvascular ischemic changes. Normal appearance of the hippocampal complexes. Electronically Signed   By: Stana Bunting M.D.   On: 09/09/2019 15:14   US Carotid Bilateral  Result Date: 09/10/2019 CLINICAL DATA:  74 year old male with a history of stroke EXAM: BILATERAL CAROTID DUPLEX ULTRASOUND TECHNIQUE: Wallace Cullens scale imaging, color Doppler and duplex ultrasound were performed of bilateral carotid and vertebral arteries in the neck. COMPARISON:  None. FINDINGS: Criteria: Quantification of carotid stenosis is based on velocity parameters that correlate the residual internal carotid diameter with  NASCET-based stenosis levels, using the diameter of the distal internal carotid lumen as the denominator for stenosis measurement. The following velocity measurements were obtained: RIGHT ICA:  Systolic 83 cm/sec, Diastolic 23 cm/sec CCA:  65 cm/sec SYSTOLIC ICA/CCA RATIO:  1.3 ECA:  112 cm/sec LEFT ICA:  Systolic 91 cm/sec, Diastolic 27 cm/sec CCA:  113 cm/sec SYSTOLIC ICA/CCA RATIO:  0.8 ECA:  96 cm/sec Right Brachial SBP: Not acquired Left Brachial SBP: Not acquired RIGHT CAROTID  ARTERY: No significant calcifications of the right common carotid artery. Intermediate waveform maintained. Heterogeneous and partially calcified plaque at the right carotid bifurcation. No significant lumen shadowing. Low resistance waveform of the right ICA. No significant tortuosity. RIGHT VERTEBRAL ARTERY: Antegrade flow with low resistance waveform. LEFT CAROTID ARTERY: No significant calcifications of the left common carotid artery. Intermediate waveform maintained. Heterogeneous and partially calcified plaque at the left carotid bifurcation without significant lumen shadowing. Low resistance waveform of the left ICA. No significant tortuosity. LEFT VERTEBRAL ARTERY:  Antegrade flow with low resistance waveform. IMPRESSION: Color duplex indicates minimal heterogeneous and calcified plaque, with no hemodynamically significant stenosis by duplex criteria in the extracranial cerebrovascular circulation. Signed, Yvone Neu. Reyne Dumas, RPVI [facility] Electronically Signed   By: Gilmer Mor D.O.   On: 09/10/2019 15:09   DG Chest Portable 1 View  Result Date: 09/08/2019 CLINICAL DATA:  Altered mental status. EXAM: PORTABLE CHEST 1 VIEW COMPARISON:  09/06/2019 FINDINGS: The cardiac silhouette, mediastinal and hilar contours are within normal limits and stable. Low lung volumes with vascular crowding and streaky atelectasis but no infiltrates or effusions. The bony thorax is intact. IMPRESSION: Low lung volumes with vascular crowding and streaky atelectasis. Electronically Signed   By: Rudie Meyer M.D.   On: 09/08/2019 06:19   DG Foot Complete Left  Result Date: 09/09/2019 CLINICAL DATA:  Frequent falls at home. History of Parkinson's disease and seizures. Has a right ankle fracture. Bruising around the second toe on the left. EXAM: LEFT FOOT - COMPLETE 3+ VIEW COMPARISON:  07/11/2015 FINDINGS: The middle phalanx of the left second toe is subluxed  laterally in relation to the proximal phalanx, but non dislocated. There are small densities along the lateral margin of the second toe PIP joint which may reflect small avulsion fracture fragments. There is no other evidence of a fracture. Remaining joints are normally aligned. There are degenerative changes most evident at the first metatarsophalangeal joint with joint space narrowing and small marginal osteophytes. Soft tissues are unremarkable. IMPRESSION: 1. Laterally subluxed PIP joint of the second toe with evidence suggesting small avulsion fractures, likely from the lateral distal corner of the proximal phalanx of the second toe. 2. No other evidence of a fracture or acute abnormality. 3. Moderate first metatarsophalangeal joint osteoarthritis. Electronically Signed   By: Amie Portland M.D.   On: 09/09/2019 12:30   DG MINI C-ARM IMAGE ONLY  Result Date: 09/11/2019 There is no interpretation for this exam.  This order is for images obtained during a surgical procedure.  Please See "Surgeries" Tab for more information regarding the procedure.   ECHOCARDIOGRAM COMPLETE  Result Date: 09/10/2019    ECHOCARDIOGRAM REPORT   Patient Name:   Ian Gill Date of Exam: 09/10/2019 Medical Rec #:  [id number]        Height:       71.0 in Accession #:    [id number]       Weight:       230.0 lb  Date of Birth:  07-07-1945        BSA:          2.238 m Patient Age:    74 years         BP:           148/64 mmHg Patient Gender: M                HR:           64 bpm. Exam Location:  [facility] Procedure: 2D Echo Indications:     Abnormal ECG R94.31  History:         Patient has no prior history of Echocardiogram examinations.  Sonographer:     Wonda Cerise RDCS Referring Phys:  [id number] Diagnosing Phys: Julien Nordmann MD  Sonographer Comments: Technically difficult study due to poor echo windows and suboptimal subcostal window. Image acquisition challenging due to patient body habitus. IMPRESSIONS  1. Left  ventricular ejection fraction, by estimation, is 60 to 65%. The left ventricle has normal function. The left ventricle has no regional wall motion abnormalities. There is mild left ventricular hypertrophy. Left ventricular diastolic parameters are indeterminate.  2. Right ventricular systolic function is normal. The right ventricular size is normal. Tricuspid regurgitation signal is inadequate for assessing PA pressure.  3. There is borderline dilatation of the aortic root measuring 37 mm. FINDINGS  Left Ventricle: Left ventricular ejection fraction, by estimation, is 60 to 65%. The left ventricle has normal function. The left ventricle has no regional wall motion abnormalities. The left ventricular internal cavity size was normal in size. There is  mild left ventricular hypertrophy. Left ventricular diastolic parameters are indeterminate. Right Ventricle: The right ventricular size is normal. No increase in right ventricular wall thickness. Right ventricular systolic function is normal. Tricuspid regurgitation signal is inadequate for assessing PA pressure. Left Atrium: Left atrial size was normal in size. Right Atrium: Right atrial size was normal in size. Pericardium: There is no evidence of pericardial effusion. Mitral Valve: The mitral valve is normal in structure. Normal mobility of the mitral valve leaflets. No evidence of mitral valve regurgitation. No evidence of mitral valve stenosis. Tricuspid Valve: The tricuspid valve is normal in structure. Tricuspid valve regurgitation is not demonstrated. No evidence of tricuspid stenosis. Aortic Valve: The aortic valve was not well visualized. Aortic valve regurgitation is not visualized. No aortic stenosis is present. Aortic valve mean gradient measures 2.0 mmHg. Aortic valve peak gradient measures 5.4 mmHg. Aortic valve area, by VTI measures 3.00 cm. Pulmonic Valve: The pulmonic valve was normal in structure. Pulmonic valve regurgitation is not visualized. No  evidence of pulmonic stenosis. Aorta: The aortic root is normal in size and structure. There is borderline dilatation of the aortic root measuring 37 mm. Venous: The inferior vena cava is normal in size with greater than 50% respiratory variability, suggesting right atrial pressure of 3 mmHg. IAS/Shunts: No atrial level shunt detected by color flow Doppler.  LEFT VENTRICLE PLAX 2D LVIDd:         5.16 cm  Diastology LVIDs:         3.43 cm  LV e' lateral:   7.62 cm/s LV PW:         1.17 cm  LV E/e' lateral: 7.9 LV IVS:        1.23 cm  LV e' medial:    7.40 cm/s LVOT diam:     2.30 cm  LV E/e' medial:  8.1 LV SV:  66 LV SV Index:   29 LVOT Area:     4.15 cm  RIGHT VENTRICLE RV Basal diam:  2.87 cm RV S prime:     15.70 cm/s TAPSE (M-mode): 2.2 cm LEFT ATRIUM           Index       RIGHT ATRIUM           Index LA diam:      4.30 cm 1.92 cm/m  RA Area:     18.00 cm LA Vol (A2C): 36.9 ml 16.49 ml/m RA Volume:   55.70 ml  24.89 ml/m LA Vol (A4C): 48.6 ml 21.72 ml/m  AORTIC VALVE AV Area (Vmax):    2.60 cm AV Area (Vmean):   3.00 cm AV Area (VTI):     3.00 cm AV Vmax:           116.00 cm/s AV Vmean:          72.800 cm/s AV VTI:            0.219 m AV Peak Grad:      5.4 mmHg AV Mean Grad:      2.0 mmHg LVOT Vmax:         72.50 cm/s LVOT Vmean:        52.500 cm/s LVOT VTI:          0.158 m LVOT/AV VTI ratio: 0.72  AORTA Ao Root diam: 3.70 cm Ao Asc diam:  3.00 cm MITRAL VALVE               TRICUSPID VALVE MV Area (PHT): 2.54 cm    TV Peak grad:   21.3 mmHg MV Decel Time: 299 msec    TV Vmax:        2.31 m/s MV E velocity: 60.00 cm/s MV A velocity: 57.00 cm/s  SHUNTS MV E/A ratio:  1.05        Systemic VTI:  0.16 m                            Systemic Diam: 2.30 cm Julien Nordmannimothy Gollan MD Electronically signed by Julien Nordmannimothy Gollan MD Signature Date/Time: 09/10/2019/1:48:45 PM    Final     Microbiology: Recent Results (from the past 240 hour(s))  SARS Coronavirus 2 by RT PCR (hospital order[facility]) Nasopharyngeal Nasopharyngeal Swab     Status: None   Collection Time: 09/08/19  7:36 AM   Specimen: Nasopharyngeal Swab  Result Value Ref Range Status   SARS Coronavirus 2 NEGATIVE NEGATIVE Final    Comment: (NOTE) SARS-CoV-2 target nucleic acids are NOT DETECTED.  The SARS-CoV-2 RNA is generally detectable in upper and lower respiratory specimens during the acute phase of infection. The lowest concentration of SARS-CoV-2 viral copies this assay can detect is 250 copies / mL. A negative result does not preclude SARS-CoV-2 infection and should not be used as the sole basis for treatment or other patient management decisions.  A negative result may occur with improper specimen collection / handling, submission of specimen other than nasopharyngeal swab, presence of viral mutation(s) within the areas targeted by this assay, and inadequate number of viral copies (<250 copies / mL). A negative result must be combined with clinical observations, patient history, and epidemiological information.  Fact Sheet for Patients:   BoilerBrush.com.cyhttps://www.fda.gov/media/136312/download  Fact Sheet for Healthcare Providers: https://pope.com/https://www.fda.gov/media/136313/download  This test is not yet approved or  cleared by the [place] FDA and has been authorized for detection and/or diagnosis of SARS-CoV-2 by FDA under an Emergency Use Authorization (EUA).  This EUA will remain in effect (meaning this test can be used) for the duration of the COVID-19 declaration under Section 564(b)(1) of the Act, 21 U.S.C. section 360bbb-3(b)(1), unless the authorization is terminated or revoked sooner.  Performed at [facility], [address] [place], [place] [address]   SARS CORONAVIRUS 2 (TAT 6-24 HRS) Nasopharyngeal Nasopharyngeal Swab     Status: None   Collection Time: 09/12/19  2:00 PM   Specimen: Nasopharyngeal Swab  Result Value Ref Range Status   SARS Coronavirus 2 NEGATIVE NEGATIVE Final     Comment: (NOTE) SARS-CoV-2 target nucleic acids are NOT DETECTED.  The SARS-CoV-2 RNA is generally detectable in upper and lower respiratory specimens during the acute phase of infection. Negative results do not preclude SARS-CoV-2 infection, do not rule out co-infections with other pathogens, and should not be used as the sole basis for treatment or other patient management decisions. Negative results must be combined with clinical observations, patient history, and epidemiological information. The expected result is Negative.  Fact Sheet for Patients: HairSlick.no  Fact Sheet for Healthcare Providers: quierodirigir.com  This test is not yet approved or cleared by the [place] FDA and  has been authorized for detection and/or diagnosis of SARS-CoV-2 by FDA under an Emergency Use Authorization (EUA). This EUA will remain  in effect (meaning this test can be used) for the duration of the COVID-19 declaration under Se ction 564(b)(1) of the Act, 21 U.S.C. section 360bbb-3(b)(1), unless the authorization is terminated or revoked sooner.  Performed at [facility], [address], [place], [place] [address]      Labs: Basic Metabolic Panel: Recent Labs  Lab 09/07/19 1445 09/09/19 0434 09/10/19 0614 09/11/19 0730 09/12/19 0517  NA 140 136 138 139 138  K 4.1 3.7 4.1 3.5 3.8  CL 101 101 100 102 99  CO2 24 26 30 26 29   GLUCOSE 116* 99 99 91 104*  BUN 12 14 12 10 11   CREATININE 0.85 0.78 0.76 0.74 0.85  CALCIUM 9.6 8.7* 9.1 9.3 9.1   Liver Function Tests: Recent Labs  Lab 09/07/19 1445  AST 70*  ALT 21  ALKPHOS 56  BILITOT 1.3*  PROT 7.2  ALBUMIN 4.5   No results for input(s): LIPASE, AMYLASE in the last 168 hours. Recent Labs  Lab 09/08/19 0429 09/10/19 1135 09/11/19 0730  AMMONIA 18 44* 21   CBC: Recent Labs  Lab 09/09/19 0434 09/10/19 0614 09/11/19 0730 09/12/19 0517 09/13/19 1231   WBC 5.9 6.3 5.0 7.5 7.6  HGB 13.0 13.3 13.8 13.5 13.5  HCT 35.9* 38.9* 38.7* 38.4* 38.8*  MCV 93.5 95.8 93.5 93.2 94.4  PLT 181 178 197 191 224   Cardiac Enzymes: No results for input(s): CKTOTAL, CKMB, CKMBINDEX, TROPONINI in the last 168 hours. BNP: BNP (last 3 results) Recent Labs    05/30/19 1544  BNP 98.0    CBG: Recent Labs  Lab 09/11/19 1700 09/12/19 0816 09/12/19 1227  GLUCAP 102* 75 96    Signed:  09/14/19 MD.  [facility] 09/14/2019, 2:30 PM

## 2019-09-14 NOTE — Progress Notes (Signed)
OT Cancellation Note  Patient Details Name: Ian Gill MRN: [id number] DOB: 1945-04-08   Cancelled Treatment:    Reason Eval/Treat Not Completed: Patient declined, no reason specified. OT attempted to see pt for Co-tx with PT this date. Pt declines session endorsing a headache/neckache this am. Will re-attempt at a later date/time as available and pt medically appropriate for OT tx.   Rockney Ghee, M.S., OTR/L Ascom: [phone] 09/14/19, 9:33 AM

## 2019-09-14 NOTE — Progress Notes (Signed)
PT Cancellation Note  Patient Details Name: Ian Gill MRN: [id number] DOB: 10-29-1945   Cancelled Treatment:    Reason Eval/Treat Not Completed: Other (comment) (Pt reported a headache/neckache this morning and that his RN aware. Politely requested PT to attempt later. Pt to re-attempt as able.)   Olga Coaster PT, DPT 9:26 AM,09/14/19

## 2019-09-14 NOTE — Care Management Important Message (Signed)
Important Message  Patient Details  Name: Ian Gill MRN: [id number] Date of Birth: Jun 27, 1945   Medicare Important Message Given:  Yes     Bernadette Hoit 09/14/2019, 11:35 AM

## 2019-09-14 NOTE — Progress Notes (Signed)
Physical Therapy Treatment Patient Details Name: Ian Gill MRN: [id number] DOB: [birth year] Today's Date: 09/14/2019    History of Present Illness Ian Gill is a 74 y.o. male s/p R anke ORIF due to changes in R ankle fracture, initially presented to ED for AMS and seizures. PMH  of seizure, hypertension, hyperlipidemia, COPD, stroke, GERD, depression, anxiety, Parkinson's disease, alcohol abuse, who presents with altered mental status and seizure.    PT Comments    Patient alert, seated in bed with legs hanging over bed rails, requesting to speak with MD. Pt agreeable to return to supine in preparation for PT/OT session. Pt oriented to name/location/date, intermittently oriented to situation, behavior WFLs for therapy. Reported headache, RN aware but no R foot pain. Pt exhibited improved bed mobility, able to transfer to EOB with bed rails and CGA and sit with good balance. Extensive cueing needed for proper set up to begin lateral scoot transfers with slide board. Ultimately needed CGA-minA to transfer to recliner. Pt up in chair, all needs in reach with chair alarm on. The patient would benefit from further skilled PT intervention to continue to progress towards goals. Recommendation remains appropriate.       Follow Up Recommendations  SNF     Equipment Recommendations  None recommended by PT;Other (comment)    Recommendations for Other Services       Precautions / Restrictions Precautions Precautions: Fall;Other (comment) Required Braces or Orthoses: Other Brace Other Brace: ortho boot Restrictions Weight Bearing Restrictions: Yes RLE Weight Bearing: Non weight bearing    Mobility  Bed Mobility Overal bed mobility: Needs Assistance Bed Mobility: Sit to Supine;Supine to Sit     Supine to sit: Min guard        Transfers Overall transfer level: Needs assistance Equipment used: Sliding board Transfers: Lateral/Scoot Transfers          Lateral/Scoot  Transfers: Min assist General transfer comment: extensive cueing to prepare patient/get into the right position, ultimately CGA/minA to complete transfer with significant UE use and good adherence to NWB precautions  Ambulation/Gait             General Gait Details: deferred   Stairs             Wheelchair Mobility    Modified Rankin (Stroke Patients Only)       Balance Overall balance assessment: Needs assistance Sitting-balance support: No upper extremity supported;Feet supported Sitting balance-Leahy Scale: Good                                      Cognition Arousal/Alertness: Awake/alert Behavior During Therapy: WFL for tasks assessed/performed Overall Cognitive Status: No family/caregiver present to determine baseline cognitive functioning                                 General Comments: flucuating mental status noted, oriented to person and date initially, and situation intermittently, re-oriented to place/date weight bearing status with pt able to repeat back education.      Exercises      General Comments        Pertinent Vitals/Pain      Home Living                      Prior Function  PT Goals (current goals can now be found in the care plan section) Progress towards PT goals: Progressing toward goals    Frequency    7X/week      PT Plan Current plan remains appropriate    Co-evaluation PT/OT/SLP Co-Evaluation/Treatment: Yes Reason for Co-Treatment: Necessary to address cognition/behavior during functional activity;For patient/therapist safety;To address functional/ADL transfers PT goals addressed during session: Mobility/safety with mobility;Balance;Proper use of DME OT goals addressed during session: Proper use of Adaptive equipment and DME      AM-PAC PT "6 Clicks" Mobility   Outcome Measure  Help needed turning from your back to your side while in a flat bed without using  bedrails?: A Little Help needed moving from lying on your back to sitting on the side of a flat bed without using bedrails?: A Little Help needed moving to and from a bed to a chair (including a wheelchair)?: A Little Help needed standing up from a chair using your arms (e.g., wheelchair or bedside chair)?: A Lot Help needed to walk in hospital room?: Total Help needed climbing 3-5 steps with a railing? : Total 6 Click Score: 13    End of Session Equipment Utilized During Treatment: Gait belt Activity Tolerance: Patient tolerated treatment well Patient left: in bed;with call bell/phone within reach;with nursing/sitter in room Nurse Communication: Mobility status PT Visit Diagnosis: Difficulty in walking, not elsewhere classified (R26.2);Other abnormalities of gait and mobility (R26.89);History of falling (Z91.81);Pain Pain - Right/Left: Left (and right) Pain - part of body: Ankle and joints of foot (R foot, L flank)     Time: 8768-1157 PT Time Calculation (min) (ACUTE ONLY): 26 min  Charges:  $Therapeutic Exercise: 8-22 mins                     Olga Coaster PT, DPT 1:11 PM,09/14/19

## 2019-09-14 NOTE — TOC Transition Note (Signed)
Transition of Care [facility]) - CM/SW Discharge Note   Patient Details  Name: ZAEEM KANDEL MRN: [id number] Date of Birth: 1945-12-28  Transition of Care [facility]) CM/SW Contact:  Allayne Butcher, RN Phone Number: 09/14/2019, 2:05 PM   Clinical Narrative:    Patient is medically cleared for discharge to [facility] today.  Pasrr has completed there evaluation, current Pasrr is good through Aug 22nd.   Patient will be going to room 347 and bedside RN will call report to [phone].  This RNCM will arranged transport via [facility].  Patient's son has been updated on plan of care and discharge for today.   Final next level of care: Skilled Nursing Facility Barriers to Discharge: Barriers Resolved   Patient Goals and CMS Choice   CMS Medicare.gov Compare Post Acute Care list provided to:: Patient Choice offered to / list presented to : Patient  Discharge Placement   Existing PASRR number confirmed : 09/09/19          Patient chooses bed at: [facility]) Patient to be transferred to facility by: [facility] Name of family member notified: Thayer Ohm (son) Patient and family notified of of transfer: 09/14/19  Discharge Plan and Services   Discharge Planning Services: CM Consult Post Acute Care Choice: Skilled Nursing Facility                               Social Determinants of Health (SDOH) Interventions     Readmission Risk Interventions No flowsheet data found.

## 2019-09-14 NOTE — Progress Notes (Signed)
Occupational Therapy Treatment Patient Details Name: Ian Gill MRN: [id number] DOB: 04-Feb-1945 Today's Date: 09/14/2019    History of present illness Ian Gill is a 74 y.o. male s/p R anke ORIF due to changes in R ankle fracture, initially presented to ED for AMS and seizures. PMH  of seizure, hypertension, hyperlipidemia, COPD, stroke, GERD, depression, anxiety, Parkinson's disease, alcohol abuse, who presents with altered mental status and seizure.   OT comments  Pt seen for OT/PT co-treatment to address safety with functional mobility and NWB status on this date. Upon arrival to pt room, pt seated upright in bed with BLE hanging out of R side below bed rails. Pt assisted back to bed with +2 max assist for bed boost, and agreeable to therapy session. He is noted with fluctuating mental status during session with increased difficulty sequencing motor tasks such as scooting L, R, or back in bed. Otherwise, he is generally oriented and maintains NWB status for RLE well during session. OT/PT facilitate functional tasks as described below. Pt requires MAX cueing for safe use of slide board transfer and MIN physical assist this date. He is able to t/f from sitting EOB to room recliner givens supports. Pt educated on safe transfer techniques and NWB status t/o session. RN educated on safe use of slide board at end of session. Pt progressing toward OT goals and continues to benefit from skilled OT services to maximize return to PLOF and minimize risk of future falls, injury, caregiver burden, and readmission. Will continue to follow POC as written. Discharge recommendation remains appropriate.    Follow Up Recommendations  SNF    Equipment Recommendations       Recommendations for Other Services      Precautions / Restrictions Precautions Precautions: Fall;Other (comment) Precaution Comments: seizure Required Braces or Orthoses: Other Brace Other Brace: ortho  boot Restrictions Weight Bearing Restrictions: Yes RLE Weight Bearing: Non weight bearing       Mobility Bed Mobility Overal bed mobility: Needs Assistance Bed Mobility: Sit to Supine;Supine to Sit     Supine to sit: Min guard Sit to supine: Mod assist;+2 for safety/equipment   General bed mobility comments: Assist for RLE mgmt and trunk control  Transfers Overall transfer level: Needs assistance Equipment used: Sliding board Transfers: Lateral/Scoot Transfers          Lateral/Scoot Transfers: Min assist General transfer comment: extensive cueing to prepare patient/get into the right position, ultimately MIN A complete transfer with significant UE use and good adherence to NWB precautions    Balance Overall balance assessment: Needs assistance Sitting-balance support: No upper extremity supported;Feet supported Sitting balance-Leahy Scale: Good Sitting balance - Comments: Steady static/dynamic sitting at EOB   Standing balance support: Bilateral upper extremity supported   Standing balance comment: Not Tested                           ADL either performed or assessed with clinical judgement   ADL Overall ADL's : Needs assistance/impaired                                       General ADL Comments: Pt dons LLE sock while seated EOB with set up/supervision for safety. He continues to require CGA/MIN A for functional mobility including sup<>sit and slide board transfers during this session.     Vision Patient Visual Report:  No change from baseline Vision Assessment?: No apparent visual deficits   Perception     Praxis      Cognition Arousal/Alertness: Awake/alert Behavior During Therapy: WFL for tasks assessed/performed Overall Cognitive Status: No family/caregiver present to determine baseline cognitive functioning                                 General Comments: flucuating mental status noted, oriented to person  and date initially, and situation intermittently, re-oriented to place/date weight bearing status with pt able to repeat back education.        Exercises Other Exercises Other Exercises: OT facilitates LBD sup>sit t/f, and education on safety with slide board transfer, falls prevention, and NWB precautions.   Shoulder Instructions       General Comments      Pertinent Vitals/ Pain       Pain Assessment: 0-10 Pain Score: 6  Pain Location: headache Pain Descriptors / Indicators: Aching;Discomfort;Dull Pain Intervention(s): Limited activity within patient's tolerance;Monitored during session;Repositioned  Home Living                                          Prior Functioning/Environment              Frequency  Min 1X/week        Progress Toward Goals  OT Goals(current goals can now be found in the care plan section)  Progress towards OT goals: Progressing toward goals  Acute Rehab OT Goals Patient Stated Goal: to return home OT Goal Formulation: With patient Time For Goal Achievement: 09/23/19 Potential to Achieve Goals: Good  Plan Discharge plan remains appropriate;Frequency remains appropriate    Co-evaluation    PT/OT/SLP Co-Evaluation/Treatment: Yes Reason for Co-Treatment: Necessary to address cognition/behavior during functional activity;For patient/therapist safety;To address functional/ADL transfers PT goals addressed during session: Mobility/safety with mobility;Balance;Proper use of DME OT goals addressed during session: ADL's and self-care;Proper use of Adaptive equipment and DME      AM-PAC OT "6 Clicks" Daily Activity     Outcome Measure   Help from another person eating meals?: A Little Help from another person taking care of personal grooming?: A Little Help from another person toileting, which includes using toliet, bedpan, or urinal?: A Lot Help from another person bathing (including washing, rinsing, drying)?: A  Lot Help from another person to put on and taking off regular upper body clothing?: A Little Help from another person to put on and taking off regular lower body clothing?: A Lot 6 Click Score: 15    End of Session Equipment Utilized During Treatment: Gait belt;Rolling walker  OT Visit Diagnosis: Unsteadiness on feet (R26.81);Other abnormalities of gait and mobility (R26.89);History of falling (Z91.81)   Activity Tolerance Patient tolerated treatment well   Patient Left in chair;with call bell/phone within reach;with chair alarm set   Nurse Communication Other (comment) (Pt using slide board t/f for chair transfer, chair alarm mising cord splitter to ring to nurses station, pt oriented to call bell.)        Time: 1610-9604 OT Time Calculation (min): 26 min  Charges: OT General Charges $OT Visit: 1 Visit OT Treatments $Self Care/Home Management : 8-22 mins  Rockney Ghee, M.S., OTR/L Ascom: [phone] 09/14/19, 1:56 PM

## 2020-01-13 ENCOUNTER — Other Ambulatory Visit
Admission: RE | Admit: 2020-01-13 | Discharge: 2020-01-13 | Disposition: A | Discharge status: Home / Self Care | Payer: Medicare Other | Source: Ambulatory Visit | Attending: Internal Medicine | Admitting: Internal Medicine

## 2020-01-13 DIAGNOSIS — Z0000 Encounter for general adult medical examination without abnormal findings: Secondary | ICD-10-CM | POA: Insufficient documentation

## 2020-01-13 LAB — COMPREHENSIVE METABOLIC PANEL
ALT: 12 U/L (ref 0–44)
AST: 32 U/L (ref 15–41)
Albumin: 4.3 g/dL (ref 3.5–5.0)
Alkaline Phosphatase: 62 U/L (ref 38–126)
Anion gap: 11 (ref 5–15)
BUN: 16 mg/dL (ref 8–23)
CO2: 26 mmol/L (ref 22–32)
Calcium: 9.4 mg/dL (ref 8.9–10.3)
Chloride: 101 mmol/L (ref 98–111)
Creatinine, Ser: 0.91 mg/dL (ref 0.61–1.24)
GFR, Estimated: >60 mL/min (ref >60)
Glucose, Bld: 96 mg/dL (ref 70–99)
Potassium: 4 mmol/L (ref 3.5–5.1)
Sodium: 138 mmol/L (ref 135–145)
Total Bilirubin: 0.7 mg/dL (ref 0.3–1.2)
Total Protein: 7 g/dL (ref 6.5–8.1)

## 2020-01-13 LAB — CBC WITH DIFFERENTIAL/PLATELET
Abs Immature Granulocytes: 0.01 10*3/uL (ref 0.00–0.07)
Basophils Absolute: 0 10*3/uL (ref 0.0–0.1)
Basophils Relative: 1 %
Eosinophils Absolute: 0.1 10*3/uL (ref 0.0–0.5)
Eosinophils Relative: 2 %
HCT: 43.3 % (ref 39.0–52.0)
Hemoglobin: 14.6 g/dL (ref 13.0–17.0)
Immature Granulocytes: 0 %
Lymphocytes Relative: 33 %
Lymphs Abs: 2 10*3/uL (ref 0.7–4.0)
MCH: 31.8 pg (ref 26.0–34.0)
MCHC: 33.7 g/dL (ref 30.0–36.0)
MCV: 94.3 fL (ref 80.0–100.0)
Monocytes Absolute: 0.7 10*3/uL (ref 0.1–1.0)
Monocytes Relative: 11 %
Neutro Abs: 3.2 10*3/uL (ref 1.7–7.7)
Neutrophils Relative %: 53 %
Platelets: 218 10*3/uL (ref 150–400)
RBC: 4.59 MIL/uL (ref 4.22–5.81)
RDW: 12.5 % (ref 11.5–15.5)
WBC: 6 10*3/uL (ref 4.0–10.5)
nRBC: 0 % (ref 0.0–0.2)

## 2020-01-13 LAB — LIPID PANEL
Cholesterol: 121 mg/dL (ref 0–200)
HDL: 56 mg/dL (ref >40)
LDL Cholesterol: 50 mg/dL (ref 0–99)
Total CHOL/HDL Ratio: 2.2 RATIO
Triglycerides: 77 mg/dL (ref <150)
VLDL: 15 mg/dL (ref 0–40)

## 2020-01-13 LAB — PSA: Prostatic Specific Antigen: 0.37 ng/mL (ref 0.00–4.00)

## 2020-01-13 LAB — TSH: TSH: 3.781 u[IU]/mL (ref 0.350–4.500)

## 2020-01-23 ENCOUNTER — Other Ambulatory Visit
Admission: RE | Admit: 2020-01-23 | Discharge: 2020-01-23 | Disposition: A | Discharge status: Home / Self Care | Payer: Medicare Other | Source: Skilled Nursing Facility | Attending: Internal Medicine | Admitting: Internal Medicine

## 2020-01-23 DIAGNOSIS — R350 Frequency of micturition: Secondary | ICD-10-CM | POA: Diagnosis present

## 2020-01-23 LAB — URINALYSIS, COMPLETE (UACMP) WITH MICROSCOPIC
Bacteria, UA: NONE SEEN
Bilirubin Urine: NEGATIVE
Glucose, UA: NEGATIVE mg/dL
Hgb urine dipstick: NEGATIVE
Ketones, ur: NEGATIVE mg/dL
Nitrite: NEGATIVE
Protein, ur: NEGATIVE mg/dL
Specific Gravity, Urine: 1.013 (ref 1.005–1.030)
Squamous Epithelial / HPF: NONE SEEN (ref 0–5)
pH: 6 (ref 5.0–8.0)

## 2020-07-06 ENCOUNTER — Other Ambulatory Visit: Payer: Self-pay

## 2020-07-06 ENCOUNTER — Emergency Department: Payer: Medicare Other

## 2020-07-06 ENCOUNTER — Inpatient Hospital Stay
Admission: EM | Admit: 2020-07-06 | Discharge: 2020-07-10 | DRG: 178 | Disposition: A | Discharge status: Skilled Nursing Facility | Payer: Medicare Other | Attending: Internal Medicine | Admitting: Internal Medicine

## 2020-07-06 DIAGNOSIS — Z8673 Personal history of transient ischemic attack (TIA), and cerebral infarction without residual deficits: Secondary | ICD-10-CM

## 2020-07-06 DIAGNOSIS — R7401 Elevation of levels of liver transaminase levels: Secondary | ICD-10-CM | POA: Diagnosis present

## 2020-07-06 DIAGNOSIS — Z66 Do not resuscitate: Secondary | ICD-10-CM | POA: Diagnosis present

## 2020-07-06 DIAGNOSIS — Z7902 Long term (current) use of antithrombotics/antiplatelets: Secondary | ICD-10-CM

## 2020-07-06 DIAGNOSIS — G4700 Insomnia, unspecified: Secondary | ICD-10-CM | POA: Diagnosis present

## 2020-07-06 DIAGNOSIS — Z825 Family history of asthma and other chronic lower respiratory diseases: Secondary | ICD-10-CM | POA: Diagnosis not present

## 2020-07-06 DIAGNOSIS — I639 Cerebral infarction, unspecified: Secondary | ICD-10-CM | POA: Diagnosis present

## 2020-07-06 DIAGNOSIS — Z96659 Presence of unspecified artificial knee joint: Secondary | ICD-10-CM | POA: Diagnosis present

## 2020-07-06 DIAGNOSIS — Z888 Allergy status to other drugs, medicaments and biological substances status: Secondary | ICD-10-CM

## 2020-07-06 DIAGNOSIS — I248 Other forms of acute ischemic heart disease: Secondary | ICD-10-CM | POA: Diagnosis present

## 2020-07-06 DIAGNOSIS — Z91041 Radiographic dye allergy status: Secondary | ICD-10-CM | POA: Diagnosis not present

## 2020-07-06 DIAGNOSIS — Z981 Arthrodesis status: Secondary | ICD-10-CM | POA: Diagnosis not present

## 2020-07-06 DIAGNOSIS — Z87891 Personal history of nicotine dependence: Secondary | ICD-10-CM

## 2020-07-06 DIAGNOSIS — E871 Hypo-osmolality and hyponatremia: Secondary | ICD-10-CM | POA: Diagnosis present

## 2020-07-06 DIAGNOSIS — G40909 Epilepsy, unspecified, not intractable, without status epilepticus: Secondary | ICD-10-CM | POA: Diagnosis present

## 2020-07-06 DIAGNOSIS — I10 Essential (primary) hypertension: Secondary | ICD-10-CM | POA: Diagnosis present

## 2020-07-06 DIAGNOSIS — F1011 Alcohol abuse, in remission: Secondary | ICD-10-CM | POA: Diagnosis present

## 2020-07-06 DIAGNOSIS — R5381 Other malaise: Secondary | ICD-10-CM | POA: Diagnosis not present

## 2020-07-06 DIAGNOSIS — R569 Unspecified convulsions: Secondary | ICD-10-CM | POA: Diagnosis not present

## 2020-07-06 DIAGNOSIS — R7989 Other specified abnormal findings of blood chemistry: Secondary | ICD-10-CM | POA: Diagnosis present

## 2020-07-06 DIAGNOSIS — G20 Parkinson's disease: Secondary | ICD-10-CM | POA: Diagnosis present

## 2020-07-06 DIAGNOSIS — U071 COVID-19: Secondary | ICD-10-CM | POA: Diagnosis not present

## 2020-07-06 DIAGNOSIS — J449 Chronic obstructive pulmonary disease, unspecified: Secondary | ICD-10-CM | POA: Diagnosis present

## 2020-07-06 DIAGNOSIS — K219 Gastro-esophageal reflux disease without esophagitis: Secondary | ICD-10-CM | POA: Diagnosis present

## 2020-07-06 DIAGNOSIS — J9601 Acute respiratory failure with hypoxia: Secondary | ICD-10-CM

## 2020-07-06 DIAGNOSIS — R296 Repeated falls: Secondary | ICD-10-CM | POA: Diagnosis present

## 2020-07-06 DIAGNOSIS — E785 Hyperlipidemia, unspecified: Secondary | ICD-10-CM | POA: Diagnosis present

## 2020-07-06 DIAGNOSIS — R531 Weakness: Secondary | ICD-10-CM | POA: Diagnosis not present

## 2020-07-06 DIAGNOSIS — Z79899 Other long term (current) drug therapy: Secondary | ICD-10-CM

## 2020-07-06 DIAGNOSIS — F32A Depression, unspecified: Secondary | ICD-10-CM | POA: Diagnosis present

## 2020-07-06 LAB — CBC
HCT: 39.1 % (ref 39.0–52.0)
Hemoglobin: 14.2 g/dL (ref 13.0–17.0)
MCH: 32.9 pg (ref 26.0–34.0)
MCHC: 36.3 g/dL — ABNORMAL HIGH (ref 30.0–36.0)
MCV: 90.5 fL (ref 80.0–100.0)
Platelets: 151 10*3/uL (ref 150–400)
RBC: 4.32 MIL/uL (ref 4.22–5.81)
RDW: 11.7 % (ref 11.5–15.5)
WBC: 12.8 10*3/uL — ABNORMAL HIGH (ref 4.0–10.5)
nRBC: 0 % (ref 0.0–0.2)

## 2020-07-06 LAB — DIFFERENTIAL
Abs Immature Granulocytes: 0.04 10*3/uL (ref 0.00–0.07)
Basophils Absolute: 0 10*3/uL (ref 0.0–0.1)
Basophils Relative: 0 %
Eosinophils Absolute: 0 10*3/uL (ref 0.0–0.5)
Eosinophils Relative: 0 %
Immature Granulocytes: 0 %
Lymphocytes Relative: 7 %
Lymphs Abs: 0.9 10*3/uL (ref 0.7–4.0)
Monocytes Absolute: 1.8 10*3/uL — ABNORMAL HIGH (ref 0.1–1.0)
Monocytes Relative: 14 %
Neutro Abs: 10 10*3/uL — ABNORMAL HIGH (ref 1.7–7.7)
Neutrophils Relative %: 79 %

## 2020-07-06 LAB — COMPREHENSIVE METABOLIC PANEL
ALT: 24 U/L (ref 0–44)
AST: 89 U/L — ABNORMAL HIGH (ref 15–41)
Albumin: 3.9 g/dL (ref 3.5–5.0)
Alkaline Phosphatase: 47 U/L (ref 38–126)
Anion gap: 10 (ref 5–15)
BUN: 22 mg/dL (ref 8–23)
CO2: 22 mmol/L (ref 22–32)
Calcium: 9.1 mg/dL (ref 8.9–10.3)
Chloride: 97 mmol/L — ABNORMAL LOW (ref 98–111)
Creatinine, Ser: 1.07 mg/dL (ref 0.61–1.24)
GFR, Estimated: >60 mL/min (ref >60)
Glucose, Bld: 121 mg/dL — ABNORMAL HIGH (ref 70–99)
Potassium: 3.9 mmol/L (ref 3.5–5.1)
Sodium: 129 mmol/L — ABNORMAL LOW (ref 135–145)
Total Bilirubin: 1.1 mg/dL (ref 0.3–1.2)
Total Protein: 6.7 g/dL (ref 6.5–8.1)

## 2020-07-06 LAB — RESP PANEL BY RT-PCR (FLU A&B, COVID) ARPGX2
Influenza A by PCR: NEGATIVE
Influenza B by PCR: NEGATIVE
SARS Coronavirus 2 by RT PCR: POSITIVE — AB

## 2020-07-06 LAB — APTT: aPTT: 36 seconds (ref 24–36)

## 2020-07-06 LAB — TROPONIN I (HIGH SENSITIVITY)
Troponin I (High Sensitivity): 31 ng/L — ABNORMAL HIGH (ref <18)
Troponin I (High Sensitivity): 24 ng/L — ABNORMAL HIGH (ref <18)

## 2020-07-06 LAB — VALPROIC ACID LEVEL: Valproic Acid Lvl: 36 ug/mL — ABNORMAL LOW (ref 50.0–100.0)

## 2020-07-06 LAB — PROTIME-INR
INR: 1.1 (ref 0.8–1.2)
Prothrombin Time: 14.3 seconds (ref 11.4–15.2)

## 2020-07-06 LAB — D-DIMER, QUANTITATIVE: D-Dimer, Quant: 1.59 ug/mL-FEU — ABNORMAL HIGH (ref 0.00–0.50)

## 2020-07-06 IMAGING — CT CT HEAD CODE STROKE
4 series · 16 of 47 positions shown, 18 images · non-contrast
Comparison: [DATE]

CLINICAL DATA: Code stroke.  Right-sided weakness

EXAM:
CT HEAD WITHOUT CONTRAST
TECHNIQUE: Contiguous axial images were obtained from the base of the skull
through the vertex without intravenous contrast.

[Series 3: head wo · axial · 0.43mm/px · z∈[+279,+394]mm · 7 of 31 slices shown, 9 images]
[im 4/31  brain]
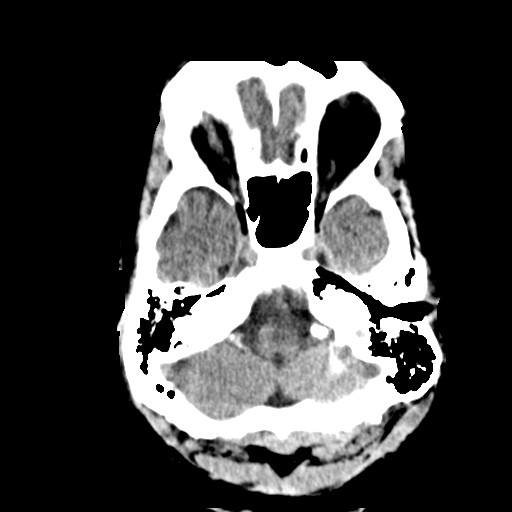
[im 4/31  bone]
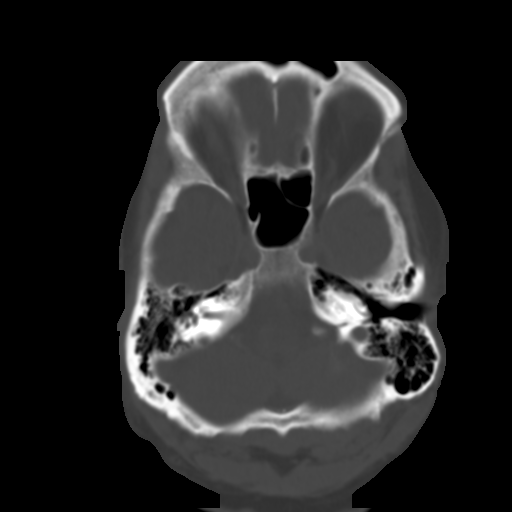
[im 8/31  brain]
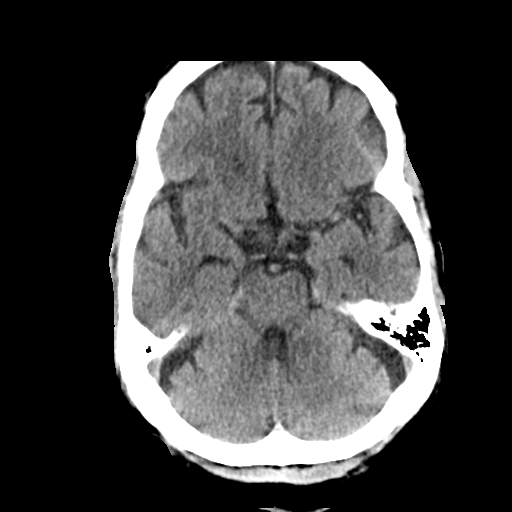
[im 12/31  brain]
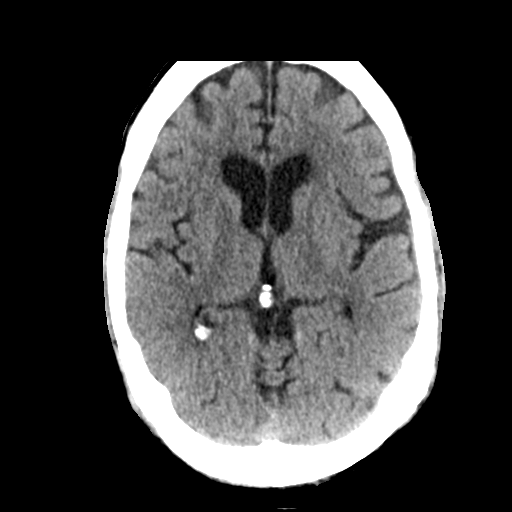
[im 16/31  brain]
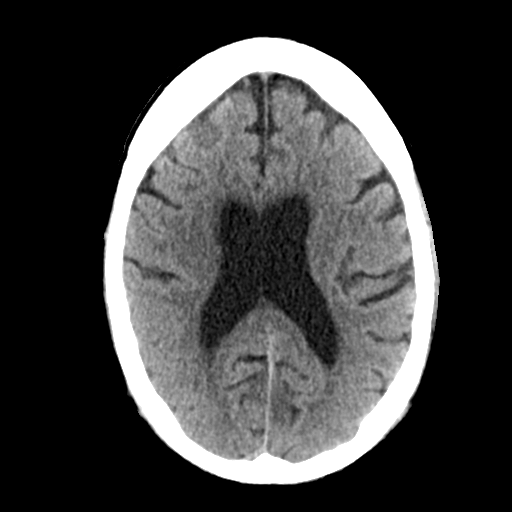
[im 19/31  brain]
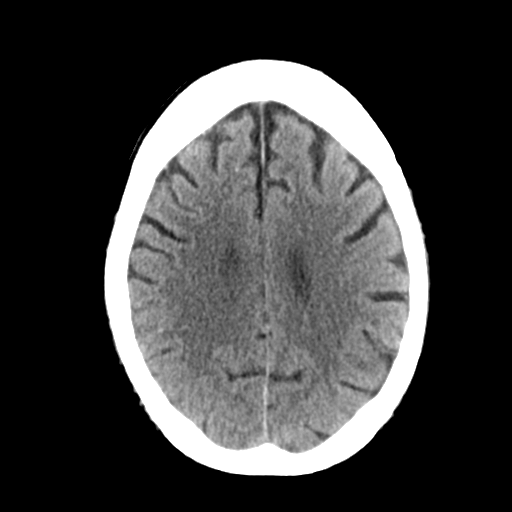
[im 19/31  bone]
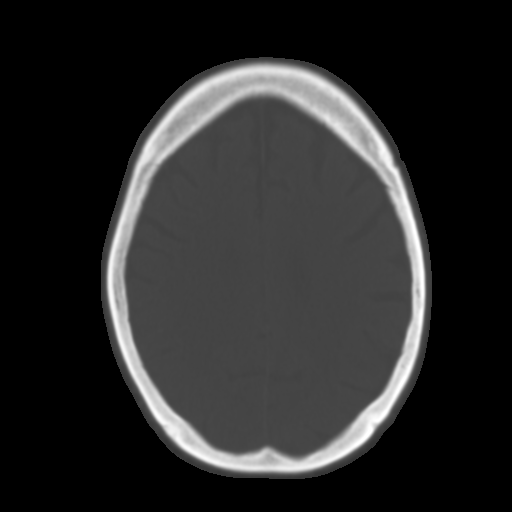
[im 23/31  brain]
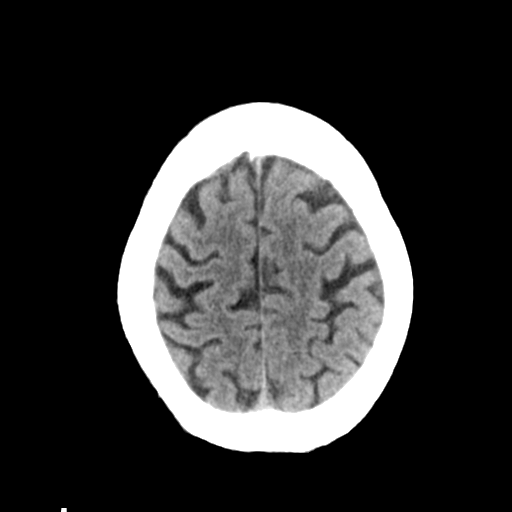
[im 27/31  brain]
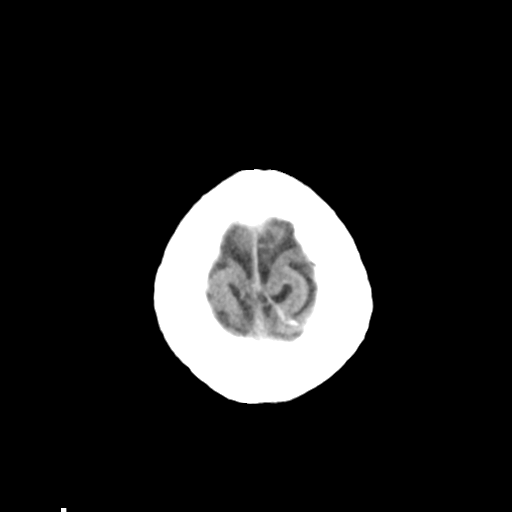

[Series 4: head bone · axial · 0.43mm/px · z∈[+278,+310]mm · 3 of 78 slices shown]
[im 8/78  bone]
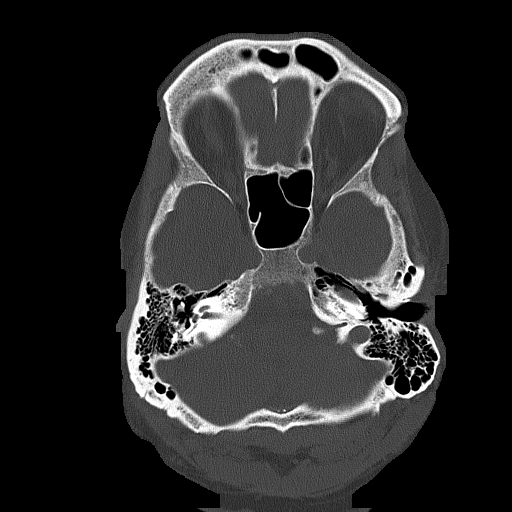
[im 16/78  bone]
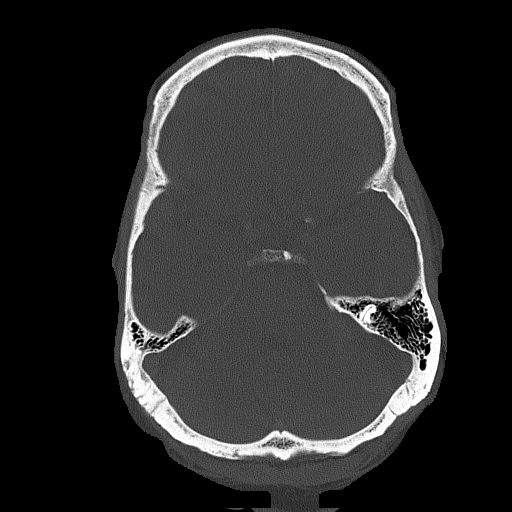
[im 24/78  bone]
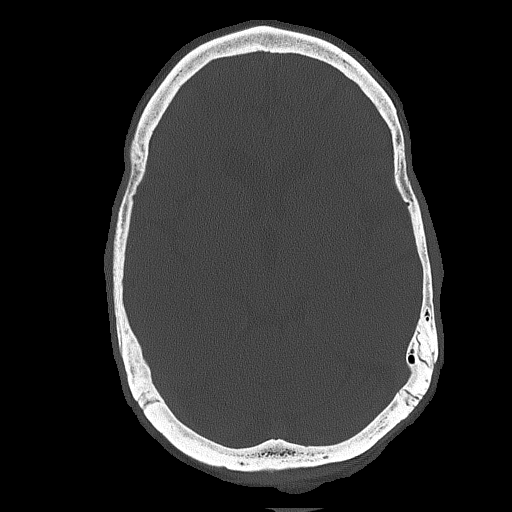

[Series 5: coronal soft tissue · coronal · 0.32mm/px · 3 of 72 slices shown]
[im 24/72  brain]
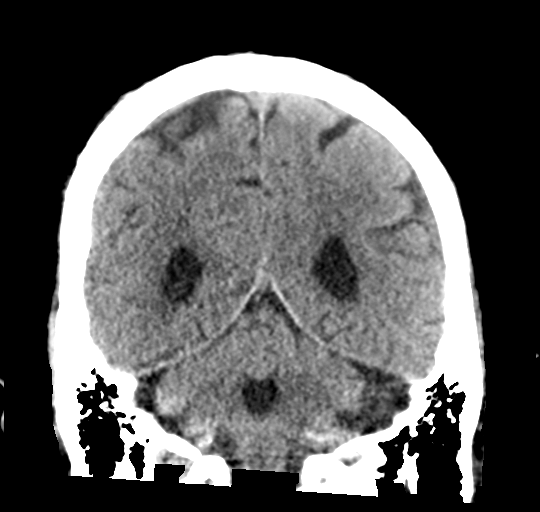
[im 32/72  brain]
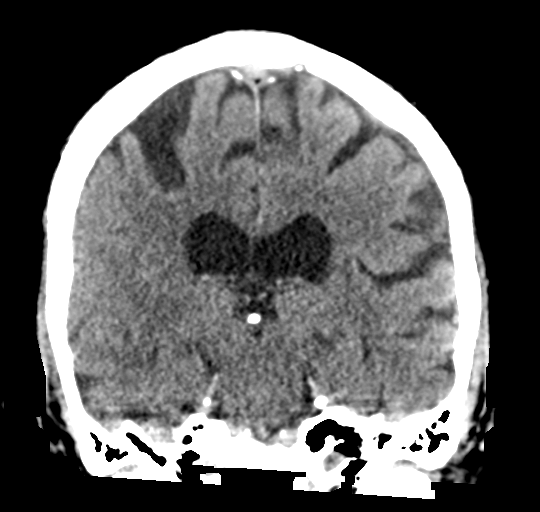
[im 40/72  brain]
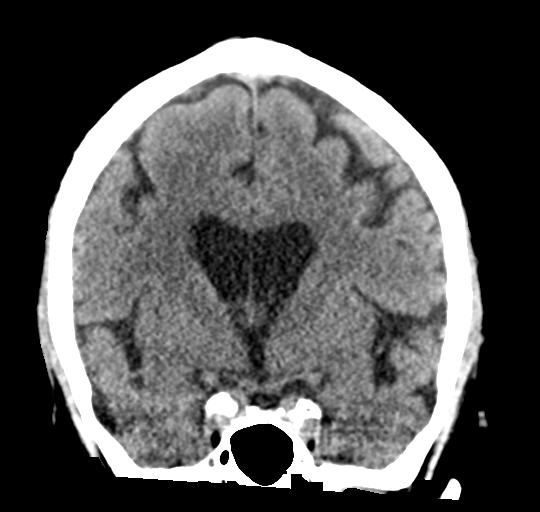

[Series 6: sagittal soft tissue · sagittal · 0.35mm/px · 3 of 56 slices shown]
[im 19/56  brain]
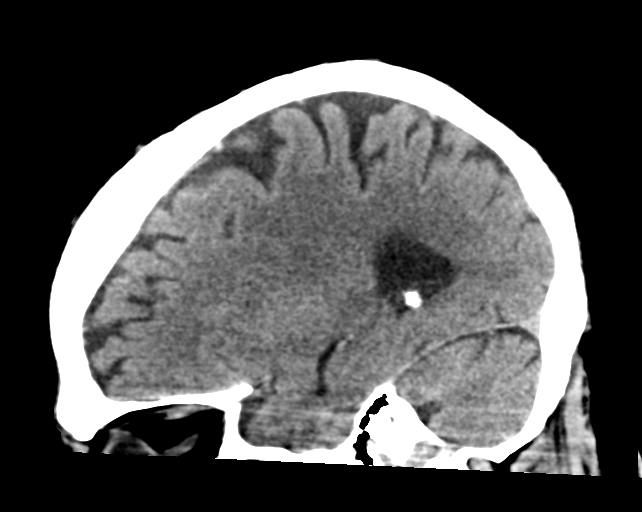
[im 28/56  brain]
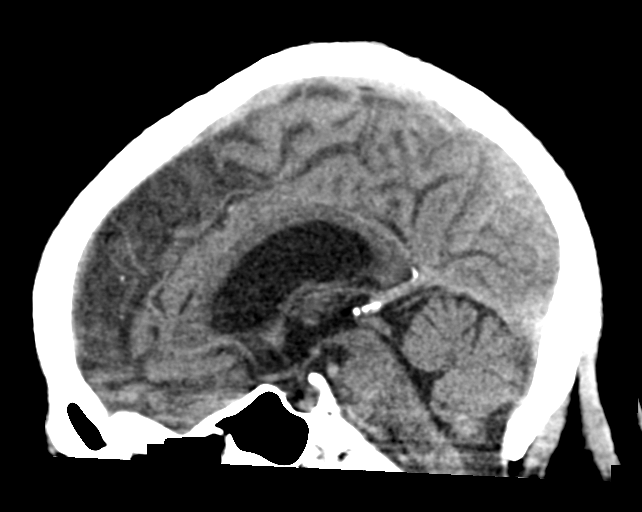
[im 37/56  brain]
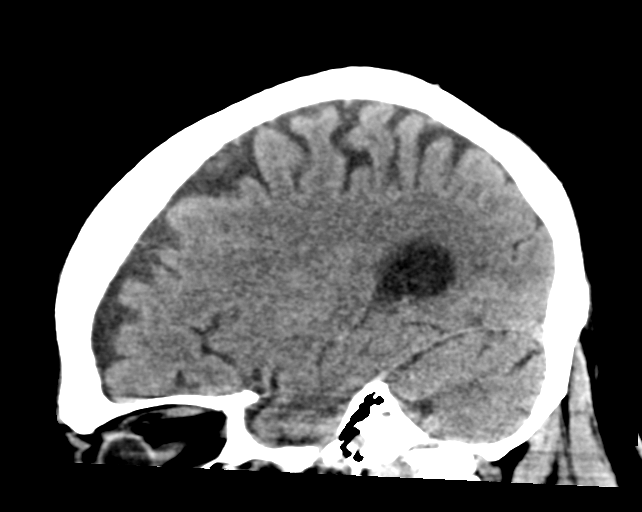

[16 of 47 positions shown; findings below may reference images not displayed]

FINDINGS: Brain: There is no acute intracranial hemorrhage, mass effect, or
edema. No new loss of gray-white differentiation. Prominence of the
ventricles and sulci reflects minor generalized parenchymal volume
loss. Patchy hypoattenuation in the supratentorial white matter is
nonspecific but may reflect mild chronic microvascular ischemic
changes. No extra-axial collection.

Vascular: No hyperdense vessel. There is intracranial
atherosclerotic calcification at the skull base.

Skull: Unremarkable.

Sinuses/Orbits: No acute abnormality.

Other: Mastoid air cells are clear.

ASPECTS (Alberta Stroke Program Early CT Score)

- Ganglionic level infarction (caudate, lentiform nuclei, internal
capsule, insula, M1-M3 cortex): 7

- Supraganglionic infarction (M4-M6 cortex): 3

Total score (0-10 with 10 being normal): 10
IMPRESSION: There is no acute intracranial hemorrhage or evidence of acute
infarction. ASPECT score is 10.

These results were communicated to Dr. TERAN at [DATE] on
[DATE] by text page via the [employer] messaging system.

## 2020-07-06 IMAGING — DX DG CHEST 1V PORT
1 series · 1 of 1 positions shown · non-contrast
Comparison: None.

CLINICAL DATA: Reason for exam: Fall Pt has hx of COPD,
parkinson's, seizures, and strokes. Pt is a former smoker. fall

EXAM:
PORTABLE CHEST 1 VIEW

[chest ap]
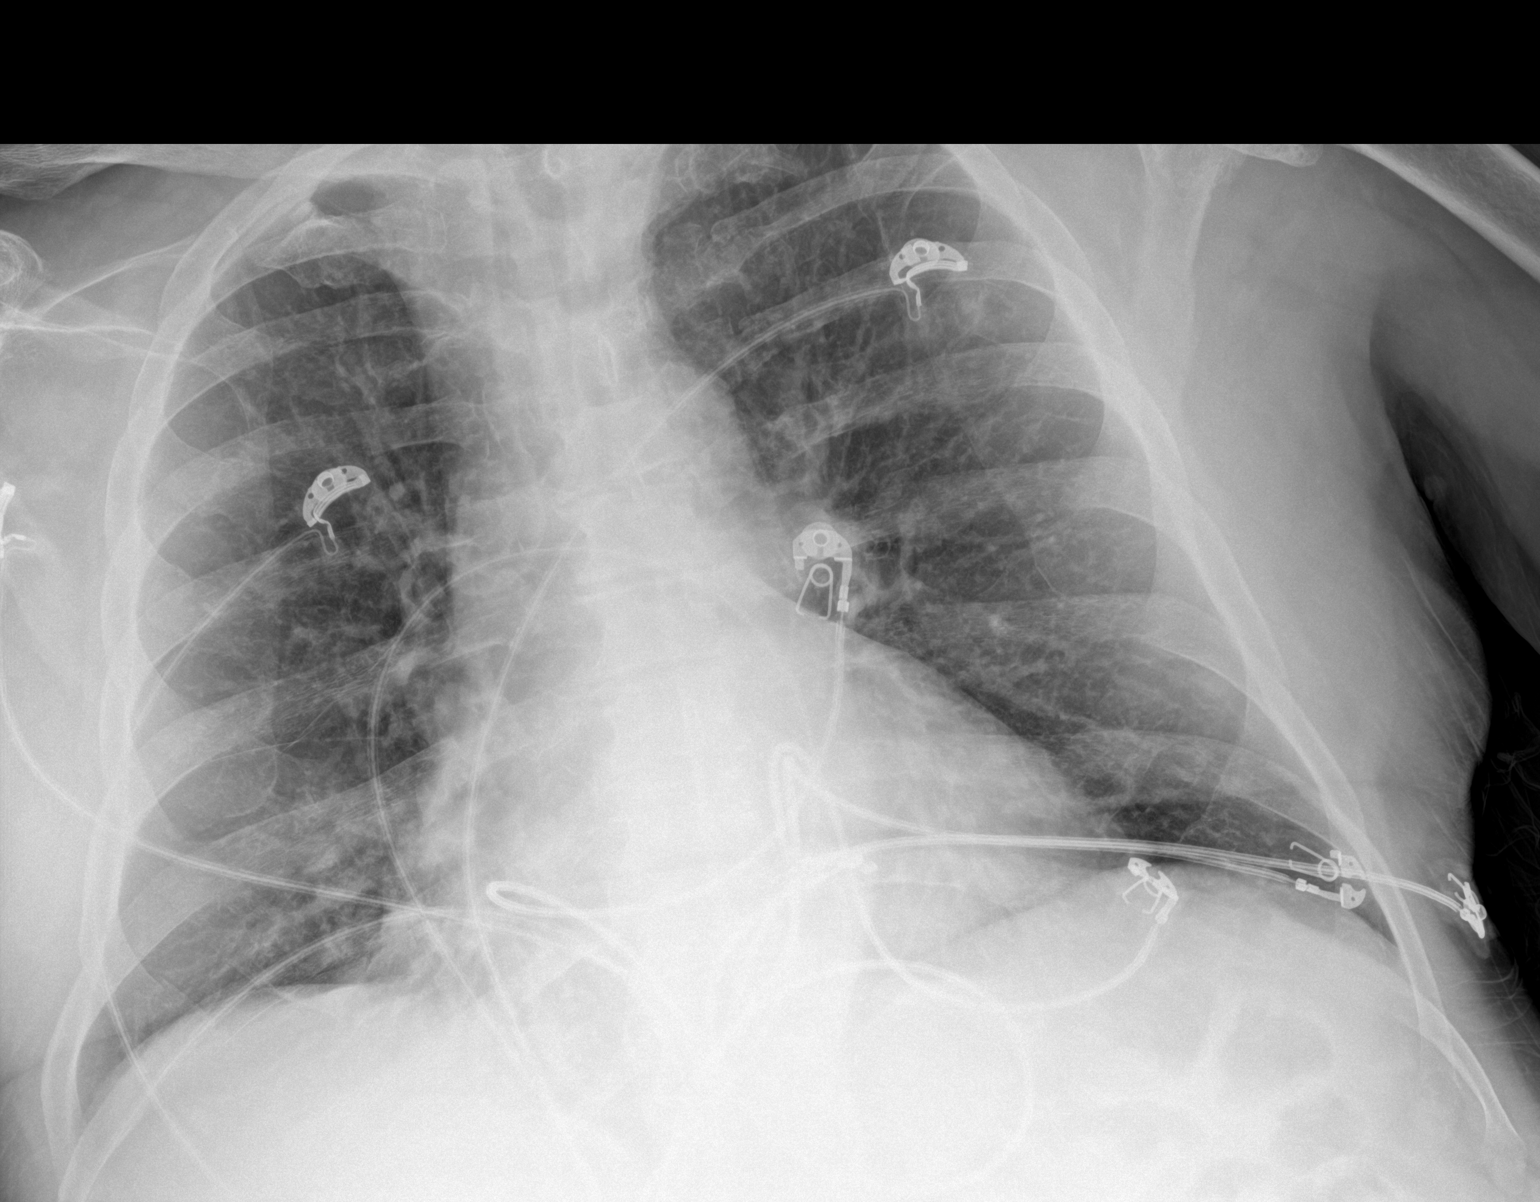

[1 of 1 positions shown; findings below may reference images not displayed]

FINDINGS: Normal mediastinum and cardiac silhouette. Normal pulmonary
vasculature. No evidence of effusion, infiltrate, or pneumothorax.
No acute bony abnormality.
IMPRESSION: No acute cardiopulmonary process.

## 2020-07-06 IMAGING — MR MR HEAD W/O CM
12 series · 48 of 48 positions shown · IV contrast (gadavist)
Comparison: [DATE].

CLINICAL DATA: Neuro deficit, acute stroke suspected.



[Series 5: ax dwi_tracew · axial · B · 3.0mm · 1.80mm/px · z∈[-112,+49]mm · 6 of 100 slices shown]
[im 1/100]
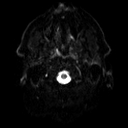
[im 20/100]
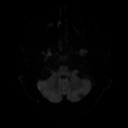
[im 40/100]
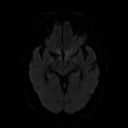
[im 60/100]
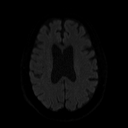
[im 80/100]
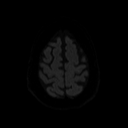
[im 100/100]
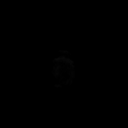

[Series 6: ax dwi_adc · axial · B · 3.0mm · 1.80mm/px · z∈[-112,+49]mm · 3 of 50 slices shown]
[im 1/50]
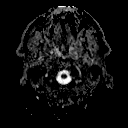
[im 25/50]
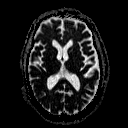
[im 50/50]
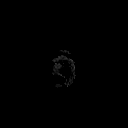

[Series 7: cor dwi_tracew · coronal · B · 5.0mm · 1.80mm/px · 5 of 80 slices shown]
[im 1/80]
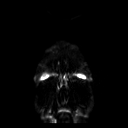
[im 20/80]
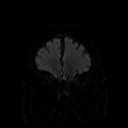
[im 40/80]
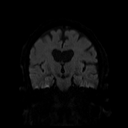
[im 60/80]
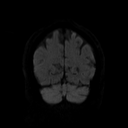
[im 80/80]
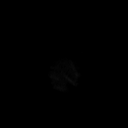

[Series 8: cor dwi_adc · coronal · B · 5.0mm · 1.80mm/px · 3 of 40 slices shown]
[im 1/40]
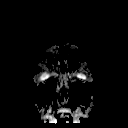
[im 20/40]
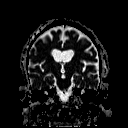
[im 40/40]
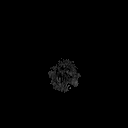

[Series 9: T1 · sagittal · B · 5.0mm · 0.62mm/px · 1 of 22 slices shown (1 of 2)]
[im 1/22]
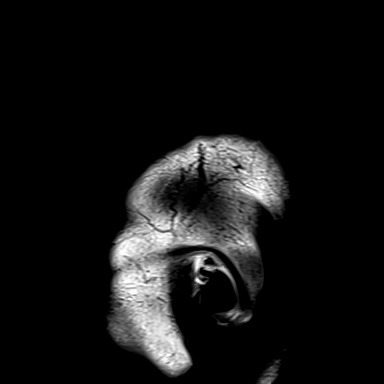

[Series 10: T2 · axial · B · 5.0mm · 0.53mm/px · z∈[-108,+47]mm · 2 of 27 slices shown (1 of 2)]
[im 1/27]
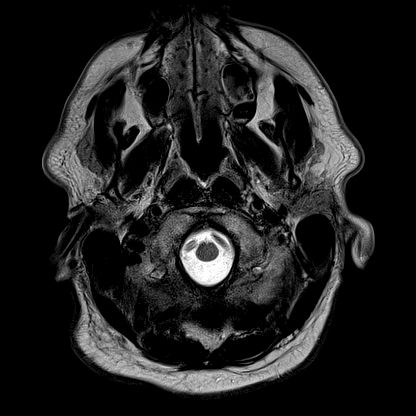
[im 27/27]
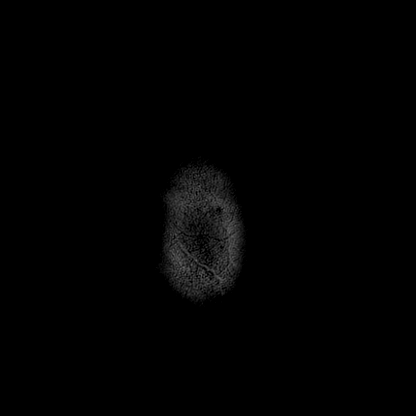

[Series 11: mag_images · axial · B · 3.0mm · 0.90mm/px · z∈[-118,+58]mm · 4 of 60 slices shown]
[im 1/60]
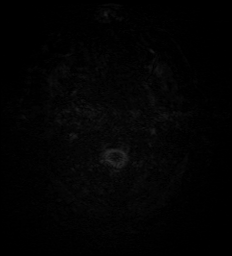
[im 20/60]
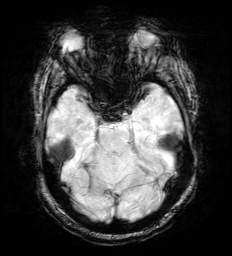
[im 40/60]
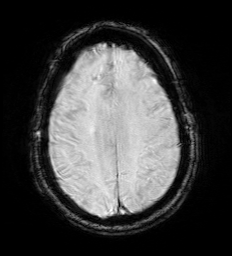
[im 60/60]
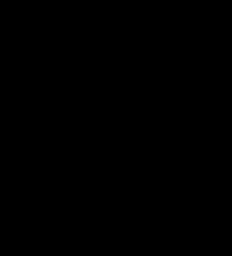

[Series 12: pha_images · axial · B · 3.0mm · 0.90mm/px · z∈[-118,+55]mm · 4 of 59 slices shown]
[im 1/59]
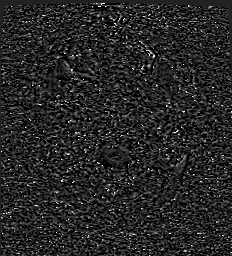
[im 20/59]
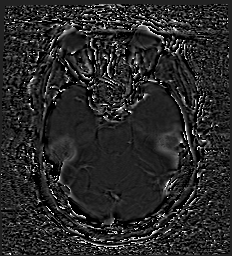
[im 39/59]
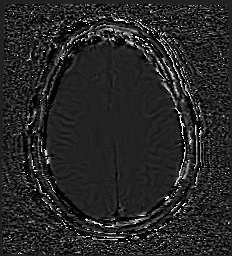
[im 59/59]
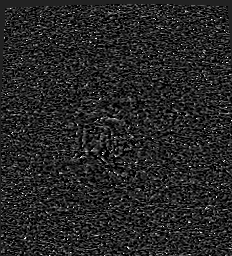

[Series 13: swi_images · axial · B · 3.0mm · 0.90mm/px · z∈[-118,+58]mm · 4 of 60 slices shown]
[im 1/60]
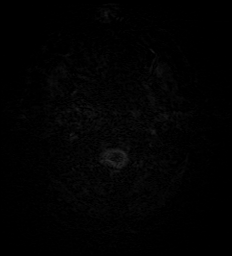
[im 20/60]
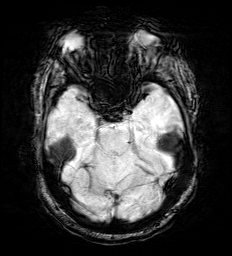
[im 40/60]
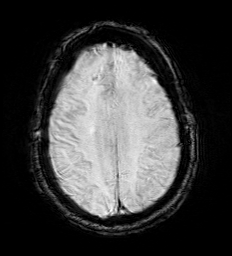
[im 60/60]
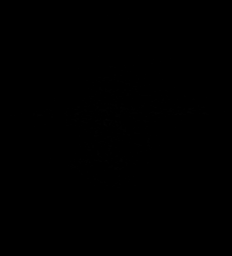

[Series 15: FLAIR · axial · B · 3.0mm · 0.53mm/px · z∈[-111,+50]mm · 3 of 55 slices shown]
[im 1/55]
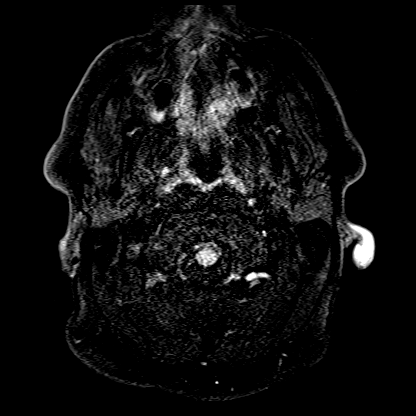
[im 28/55]
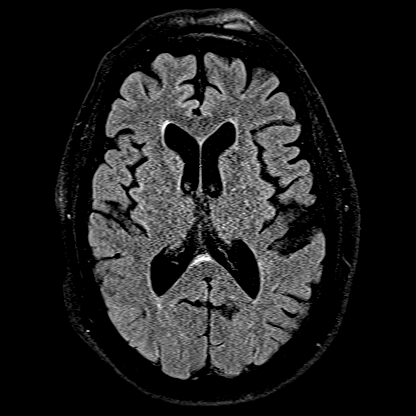
[im 55/55]
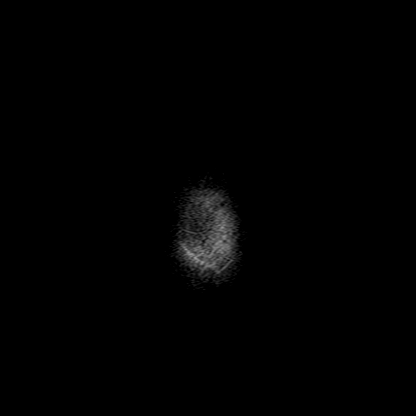

[Series 16: T1 · axial · B · 1.0mm · 0.98mm/px · z∈[-115,+58]mm · 11 of 176 slices shown (2 of 2)]
[im 1/176]
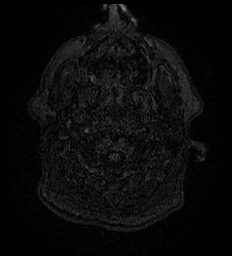
[im 18/176]
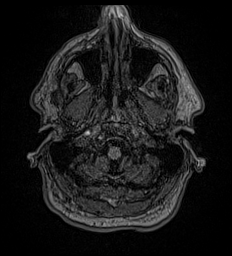
[im 36/176]
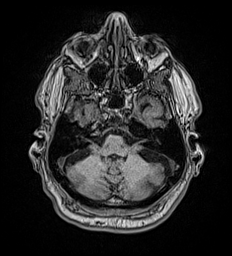
[im 53/176]
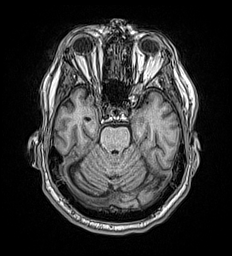
[im 71/176]
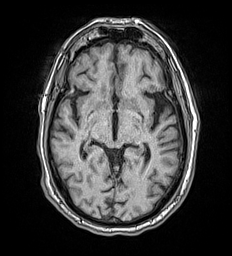
[im 88/176]
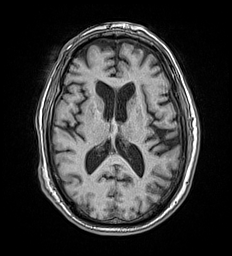
[im 106/176]
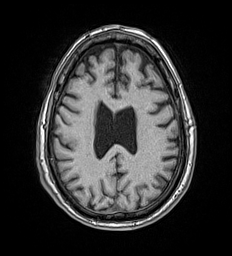
[im 123/176]
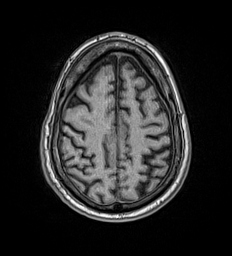
[im 141/176]
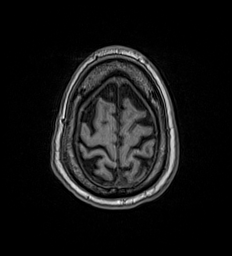
[im 158/176]
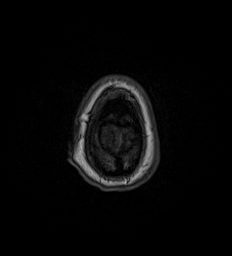
[im 176/176]
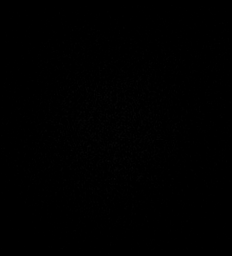

[Series 17: T2 · coronal · B · 5.0mm · 0.57mm/px · 2 of 31 slices shown (2 of 2)]
[im 1/31]
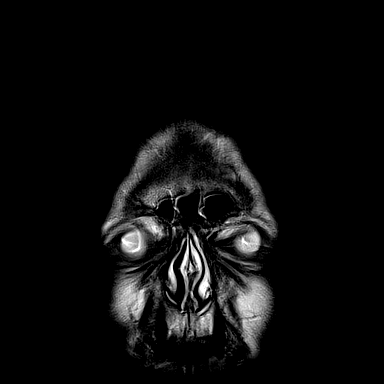
[im 31/31]
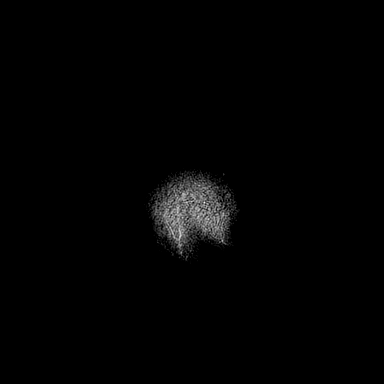

[48 of 48 positions shown; findings below may reference images not displayed]

FINDINGS: MRI HEAD FINDINGS

Brain: No acute infarction, hemorrhage, hydrocephalus, extra-axial
collection or mass lesion. No pathologic intracranial enhancement.
There are a few T2/FLAIR hyperintensities within the white matter,
most likely related to chronic microvascular ischemic disease that
is mild for age. Mild atrophy with ex vacuo ventricular dilation.

Vascular: See below.

Skull and upper cervical spine: Normal marrow signal.

Sinuses/Orbits: Mild pansinus mucosal thickening. Unremarkable
orbits.

Other: No mastoid effusions.

MRA HEAD FINDINGS

Anterior circulation: No large vessel occlusion or proximal
hemodynamically significant stenosis. No aneurysm.

Posterior circulation: No large vessel occlusion or proximal
hemodynamically significant stenosis. Bilateral posterior
communicating arteries. No aneurysm.

MRA NECK FINDINGS

Aortic arch: Great vessel origins are patent.

Right carotid system: No visible significant (greater than 50%)
stenosis.

Left carotid system: Mild narrowing at the carotid bifurcation. No
visible significant (greater than 50%) stenosis.

Vertebral arteries: Left dominant. Limited evaluation of the
origins. No visible significant (greater than 50%) stenosis.
IMPRESSION: 1. No evidence of acute intracranial abnormality.
2. No large vessel occlusion or visible hemodynamically significant
stenosis.

## 2020-07-06 IMAGING — MR MR MRA HEAD W/O CM
1 series · 19 of 48 positions shown · IV contrast (gadavist)
Comparison: [DATE].

CLINICAL DATA: Neuro deficit, acute stroke suspected.



[Series 1: TOF · axial · B · 0.5mm · 0.41mm/px · z∈[-113,-17]mm · 19 of 205 slices shown]
[im 1/205]
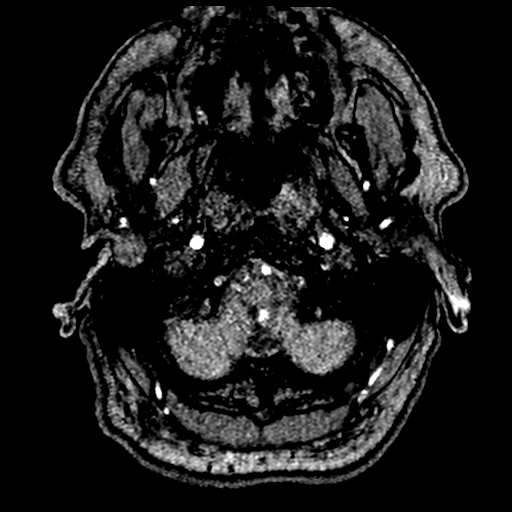
[im 5/205]
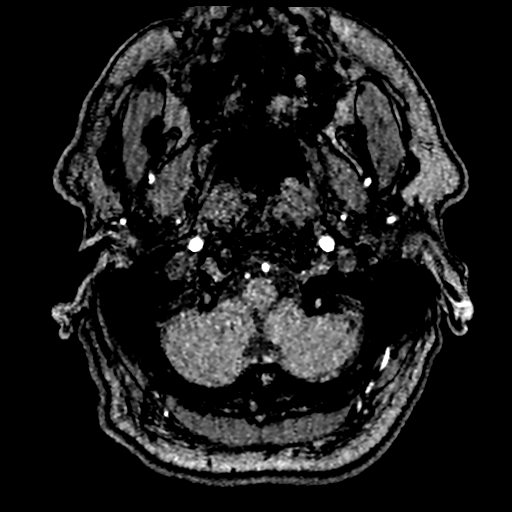
[im 9/205]
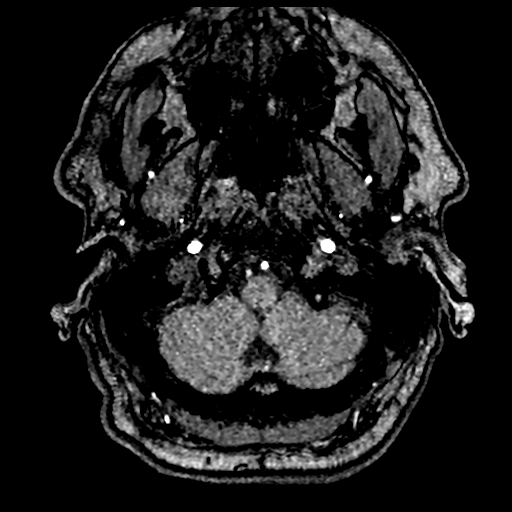
[im 14/205]
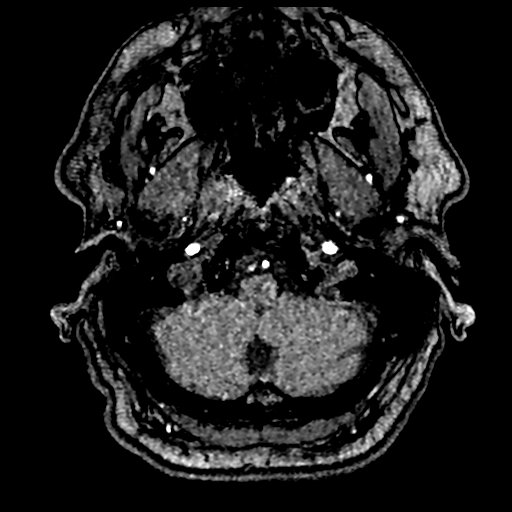
[im 18/205]
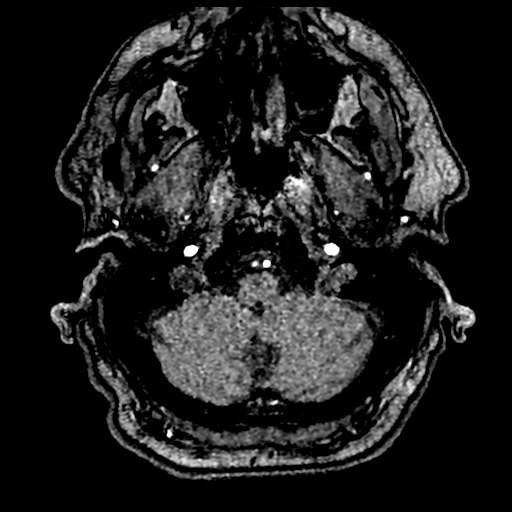
[im 22/205]
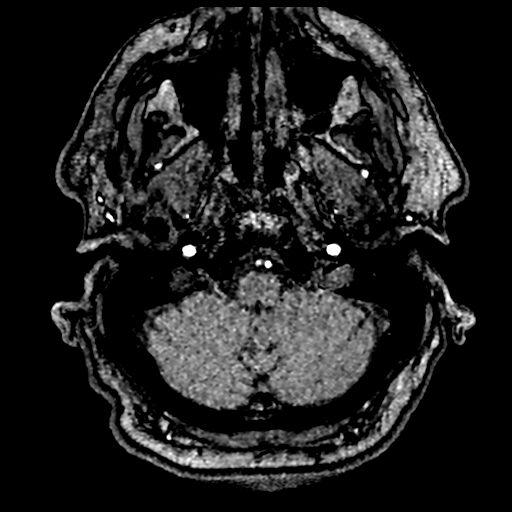
[im 27/205]
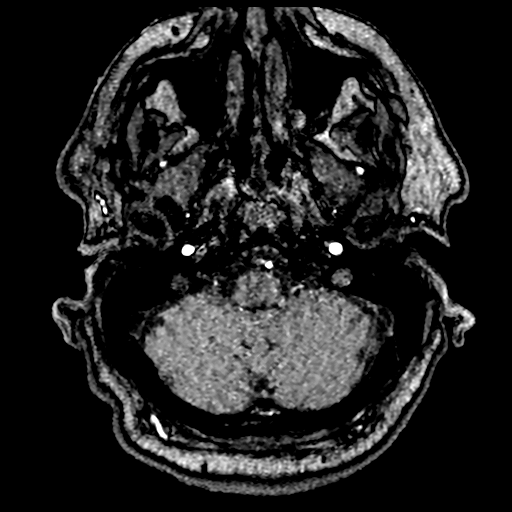
[im 31/205]
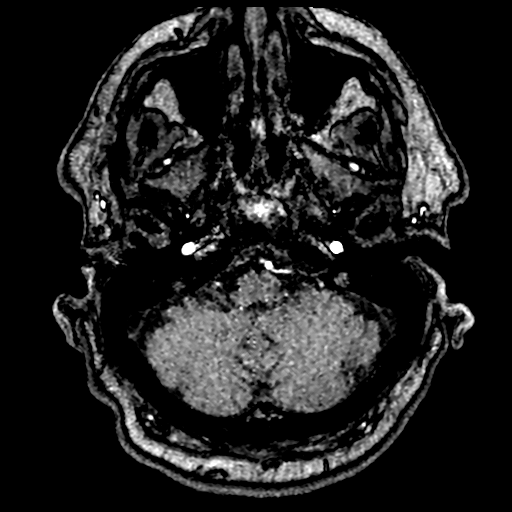
[im 35/205]
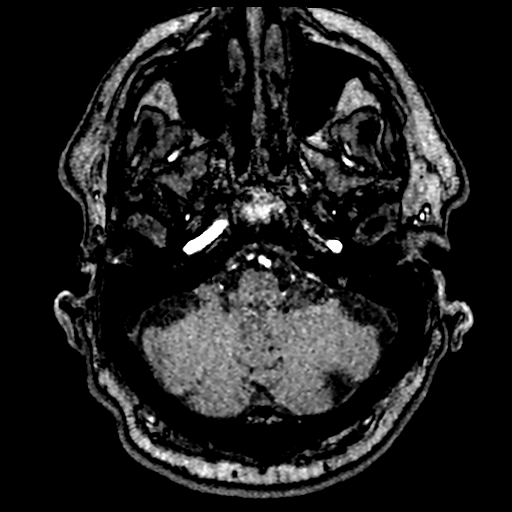
[im 40/205]
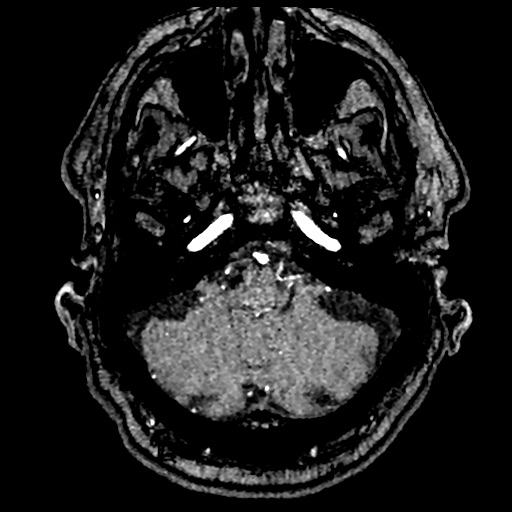
[im 44/205]
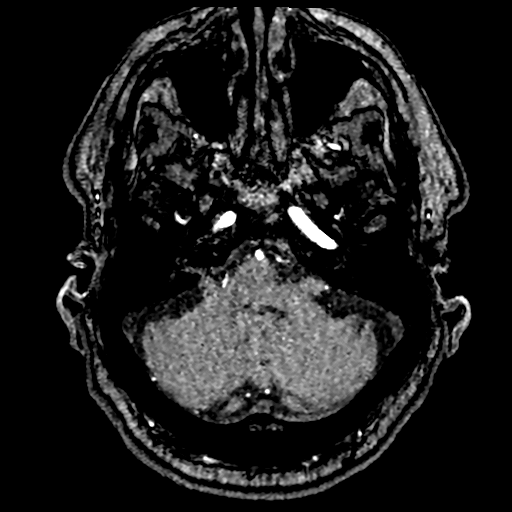
[im 66/205]
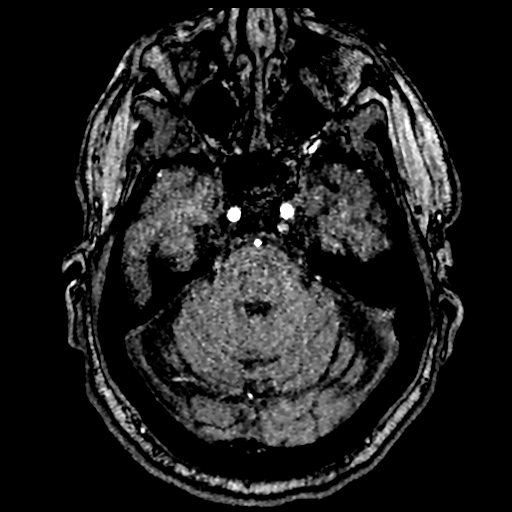
[im 92/205]
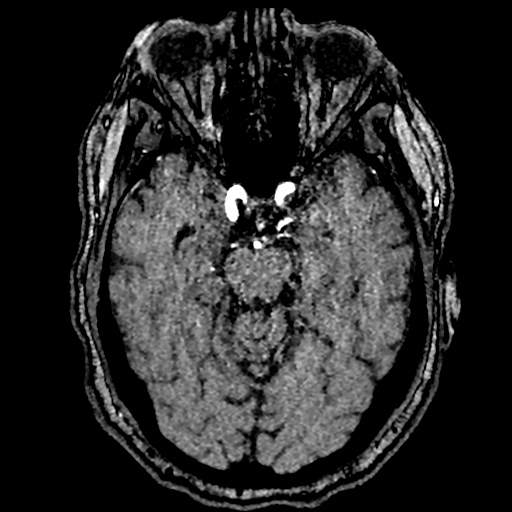
[im 105/205]
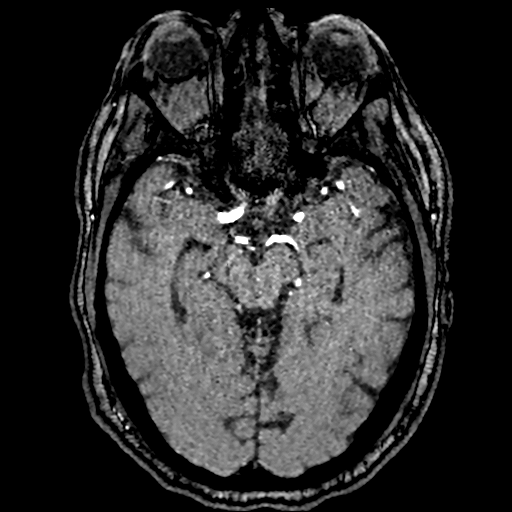
[im 118/205]
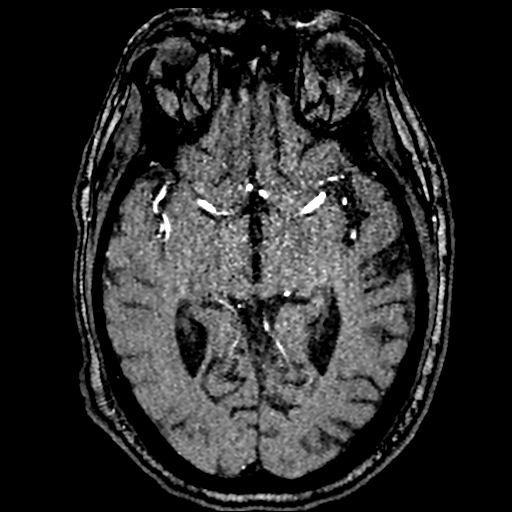
[im 144/205]
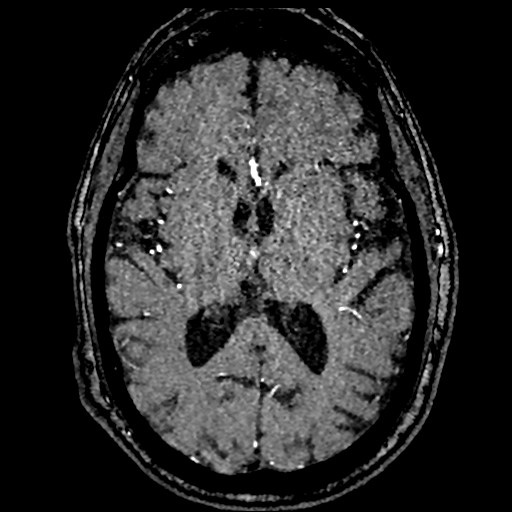
[im 170/205]
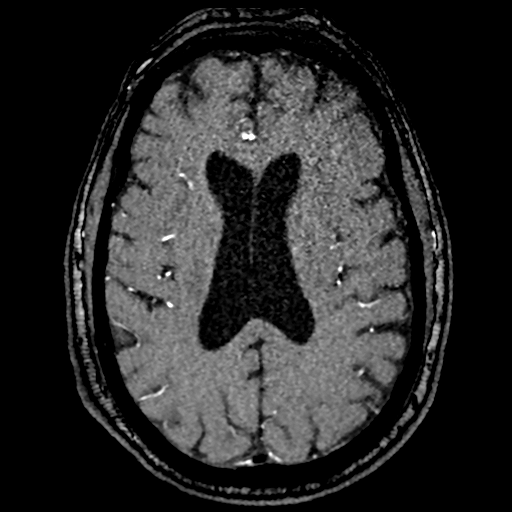
[im 174/205]
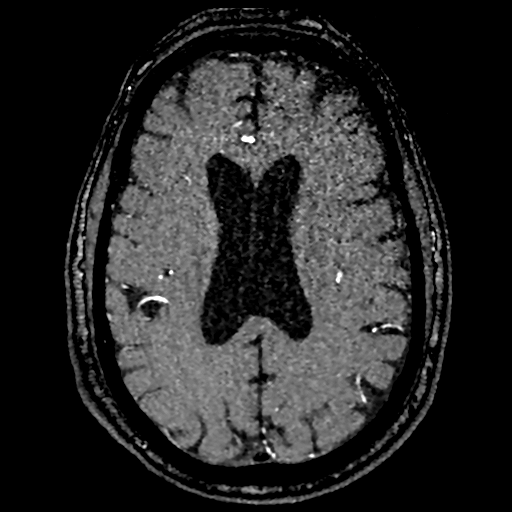
[im 196/205]
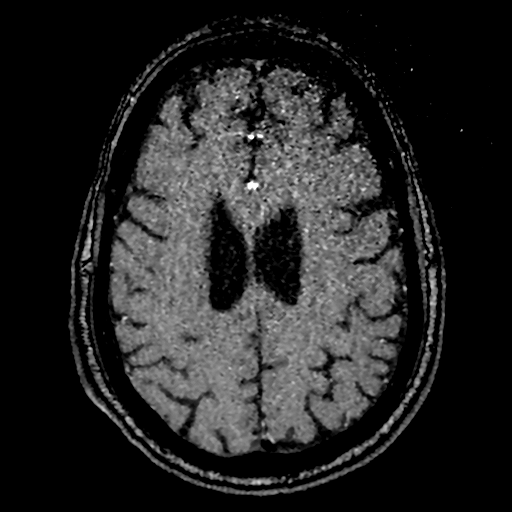

[19 of 48 positions shown; findings below may reference images not displayed]

FINDINGS: MRI HEAD FINDINGS

Brain: No acute infarction, hemorrhage, hydrocephalus, extra-axial
collection or mass lesion. No pathologic intracranial enhancement.
There are a few T2/FLAIR hyperintensities within the white matter,
most likely related to chronic microvascular ischemic disease that
is mild for age. Mild atrophy with ex vacuo ventricular dilation.

Vascular: See below.

Skull and upper cervical spine: Normal marrow signal.

Sinuses/Orbits: Mild pansinus mucosal thickening. Unremarkable
orbits.

Other: No mastoid effusions.

MRA HEAD FINDINGS

Anterior circulation: No large vessel occlusion or proximal
hemodynamically significant stenosis. No aneurysm.

Posterior circulation: No large vessel occlusion or proximal
hemodynamically significant stenosis. Bilateral posterior
communicating arteries. No aneurysm.

MRA NECK FINDINGS

Aortic arch: Great vessel origins are patent.

Right carotid system: No visible significant (greater than 50%)
stenosis.

Left carotid system: Mild narrowing at the carotid bifurcation. No
visible significant (greater than 50%) stenosis.

Vertebral arteries: Left dominant. Limited evaluation of the
origins. No visible significant (greater than 50%) stenosis.
IMPRESSION: 1. No evidence of acute intracranial abnormality.
2. No large vessel occlusion or visible hemodynamically significant
stenosis.

## 2020-07-06 IMAGING — MR MR MRA NECK WO/W CM
3 of 4 series · 30 of 48 positions shown · IV contrast (gadavist)
Comparison: [DATE].

CLINICAL DATA: Neuro deficit, acute stroke suspected.



[Series 9: angio_fl3d_cor_pre_ttc=2.0s · coronal · B · 0.9mm · 0.85mm/px · 10 of 96 slices shown]
[im 1/96]
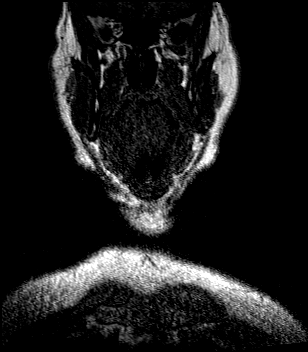
[im 11/96]
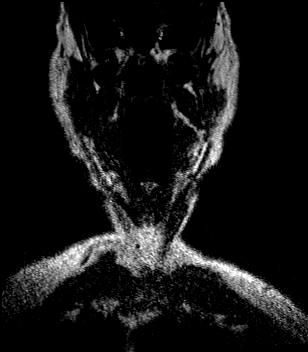
[im 22/96]
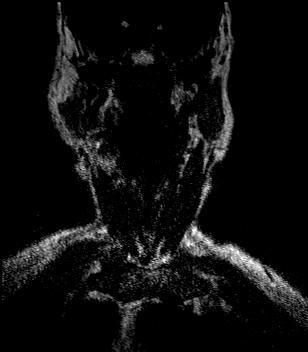
[im 32/96]
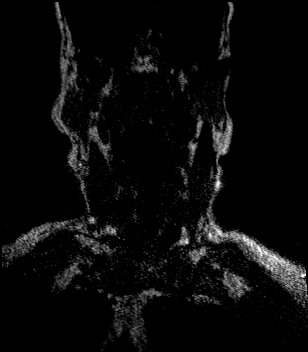
[im 43/96]
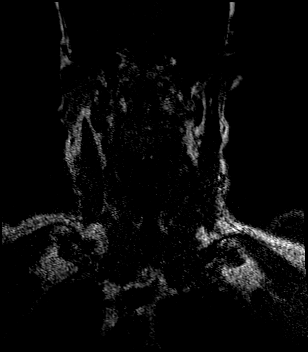
[im 53/96]
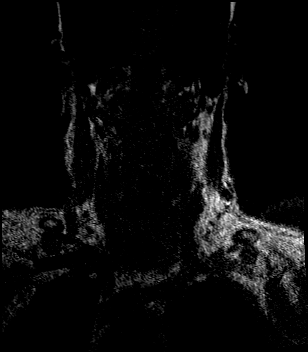
[im 64/96]
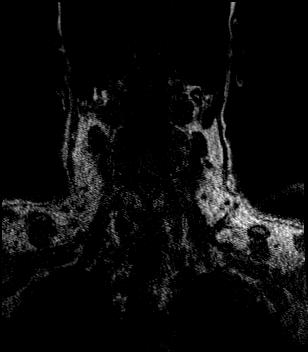
[im 74/96]
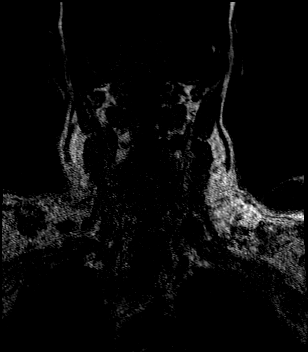
[im 85/96]
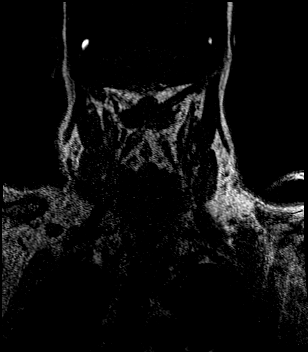
[im 96/96]
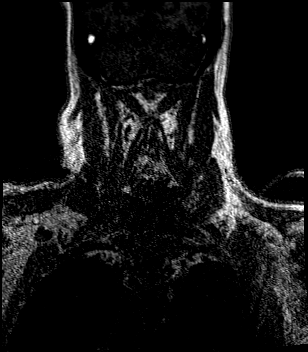

[Series 12: angio_fl3d_cor_post_ttc=2.0s_moco-adv · coronal · B · 0.9mm · 0.85mm/px · 10 of 96 slices shown]
[im 1/96]
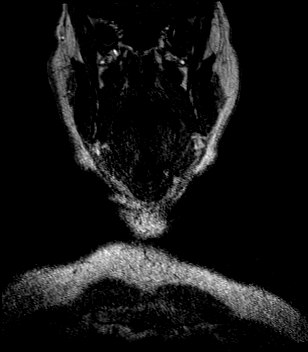
[im 11/96]
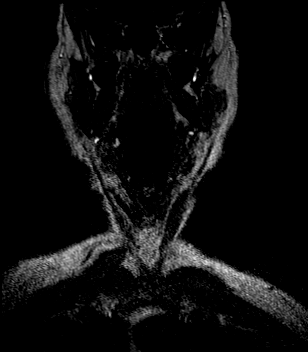
[im 22/96]
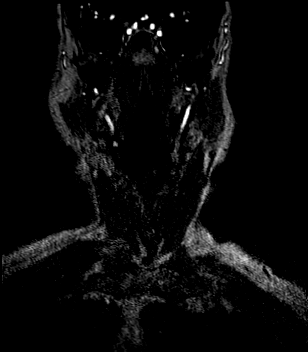
[im 32/96]
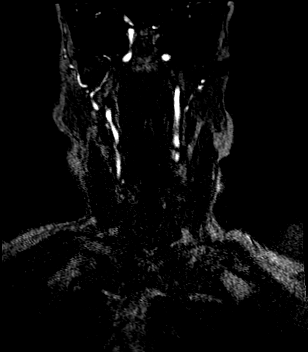
[im 43/96]
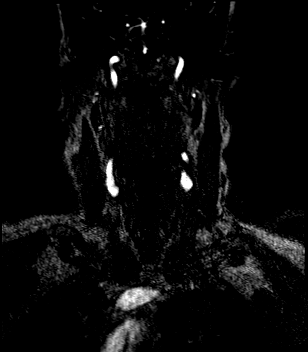
[im 53/96]
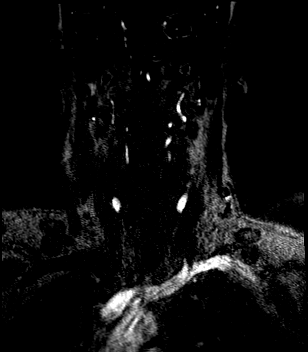
[im 64/96]
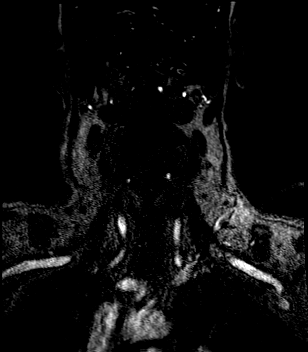
[im 74/96]
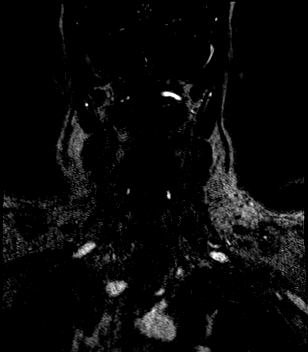
[im 85/96]
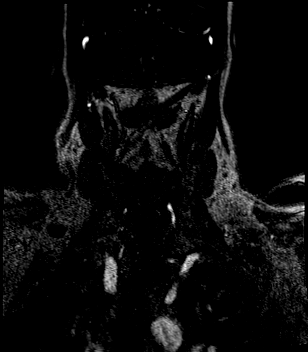
[im 96/96]
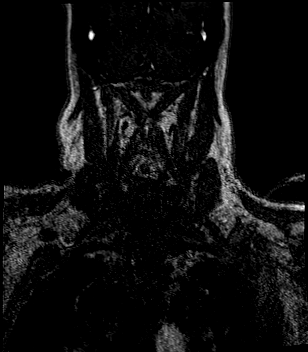

[Series 13: angio_fl3d_cor_post_ttc=2.0s_moco-adv_sub · coronal · B · 0.9mm · 0.85mm/px · 10 of 96 slices shown]
[im 1/96]
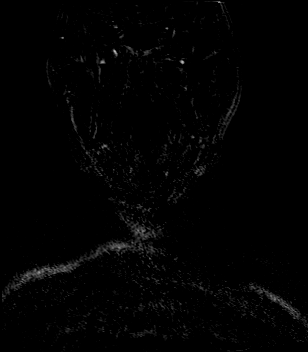
[im 11/96]
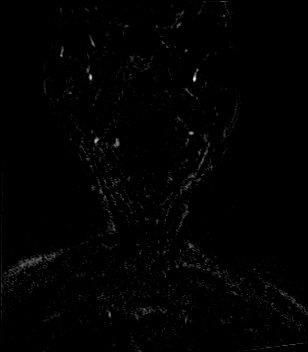
[im 22/96]
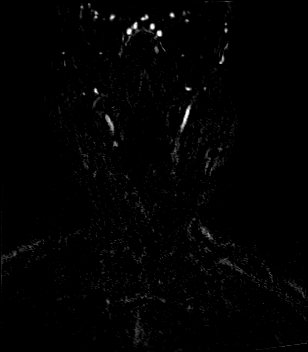
[im 32/96]
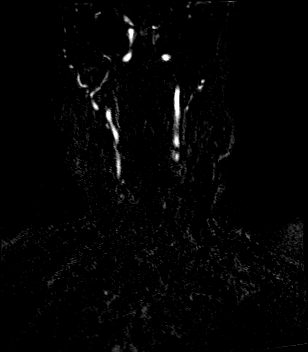
[im 43/96]
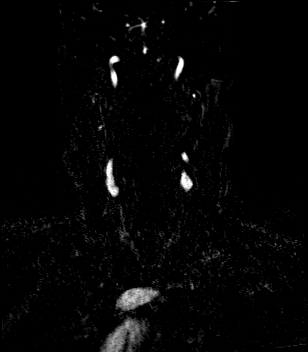
[im 53/96]
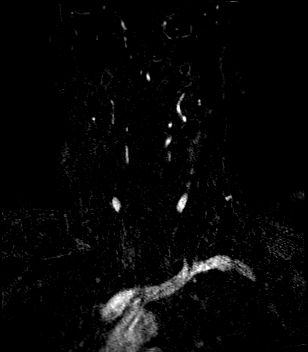
[im 64/96]
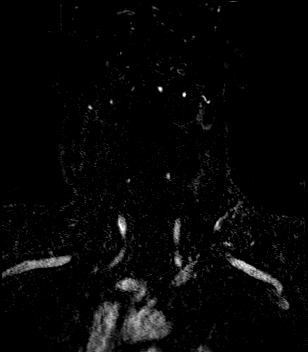
[im 74/96]
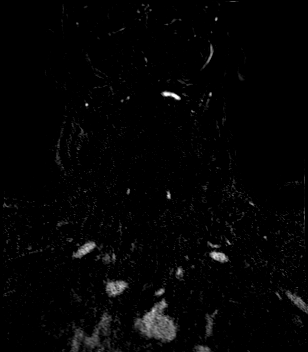
[im 85/96]
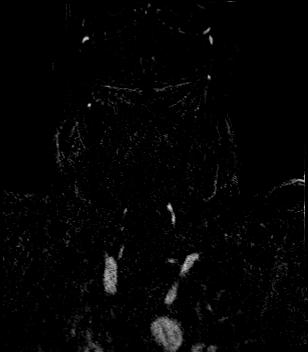
[im 96/96]
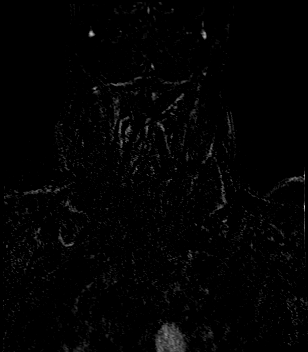

[30 of 48 positions shown; findings below may reference images not displayed]

FINDINGS: MRI HEAD FINDINGS

Brain: No acute infarction, hemorrhage, hydrocephalus, extra-axial
collection or mass lesion. No pathologic intracranial enhancement.
There are a few T2/FLAIR hyperintensities within the white matter,
most likely related to chronic microvascular ischemic disease that
is mild for age. Mild atrophy with ex vacuo ventricular dilation.

Vascular: See below.

Skull and upper cervical spine: Normal marrow signal.

Sinuses/Orbits: Mild pansinus mucosal thickening. Unremarkable
orbits.

Other: No mastoid effusions.

MRA HEAD FINDINGS

Anterior circulation: No large vessel occlusion or proximal
hemodynamically significant stenosis. No aneurysm.

Posterior circulation: No large vessel occlusion or proximal
hemodynamically significant stenosis. Bilateral posterior
communicating arteries. No aneurysm.

MRA NECK FINDINGS

Aortic arch: Great vessel origins are patent.

Right carotid system: No visible significant (greater than 50%)
stenosis.

Left carotid system: Mild narrowing at the carotid bifurcation. No
visible significant (greater than 50%) stenosis.

Vertebral arteries: Left dominant. Limited evaluation of the
origins. No visible significant (greater than 50%) stenosis.
IMPRESSION: 1. No evidence of acute intracranial abnormality.
2. No large vessel occlusion or visible hemodynamically significant
stenosis.

## 2020-07-06 MED ORDER — LACTATED RINGERS IV BOLUS
1000.0000 mL | Freq: Once | INTRAVENOUS | Status: AC
  Administered 2020-07-06: 1000 mL via INTRAVENOUS

## 2020-07-06 MED ORDER — SODIUM CHLORIDE 0.9% FLUSH
3.0000 mL | Freq: Once | INTRAVENOUS | Status: DC
Start: 2020-07-06 — End: 2020-07-07

## 2020-07-06 MED ORDER — GADOBUTROL 1 MMOL/ML IV SOLN
10.0000 mL | Freq: Once | INTRAVENOUS | Status: AC | PRN
  Administered 2020-07-06: 10 mL via INTRAVENOUS

## 2020-07-06 NOTE — H&P (Addendum)
[facility] History and Physical  RIAAN TOLEDO ZOX:[id number] DOB: [birth year] DOA: 07/06/2020  Referring physician: Greig Right, PA PCP: Barbette Reichmann, MD   Chief Complaint: weakness  HPI: FARRELL PANTALEO is a 75 y.o. male with history of COPD, seizure disorder, CVA, hyperlipidemia, hypertension, alcohol use disorder in remission, who presents after being found down at his group home this morning in the setting of recent COVID diagnosis.  Per ED providers history he was brought in from a group home where he was found on the floor this morning, at that time he was unsure how he got there and if he had had a seizure which led to him falling there.  Was also reported that he had been recently diagnosed with COVID.  Review of chart shows that he had his first positive noted in a home care visit from June 8 and was prescribed molunipavir.  Review of chart shows patient last saw neurology on April 19 of this year, at that time no changes made to his seizure medications, there was some concern for possible dementia, he was started on primidone for tremors, and he was referred to PT for frequent falls.  On my history patient reports that he woke up this morning between his bed and his chair with the lamp on.  He is unsure how he got there but feels like there may have been some seizure activity involved.  He lives in a skilled nursing facility due to issues with mobility.  He reports that he has been having significant falls for the past several months.  He denies any missed medications recently.  He endorses having a sore throat and cough that is very painful to him but otherwise no symptoms of chest pain, shortness of breath, abdominal pain, burning or pain with urination, diarrhea.  While being transported to the ED by EMS there was some concern for right facial droop and code stroke was activated. In the ED initial vital signs notable for hypertension but otherwise unremarkable.  Lab  work-up notable for CMP with hyponatremia to 129 and AST of 89, neither of which are typical for him, remainder was unremarkable.  CBC had mild leukocytosis of 12.8 but was otherwise unremarkable.  D-dimer was slightly elevated at 1.59, valproic acid level was subtherapeutic at 36, and initial troponin was 31 with repeat pending.  He underwent a CT head to evaluate for stroke which was read as negative.  He had a chest x-ray that was unremarkable.  EKG was obtained which was unchanged from prior and had no acute ischemic changes.  MRI brain and MRA head and neck were ordered and are still pending.  Attempts were made to ambulate patient but he was unable to do so.  He was admitted for further work-up and management.   Review of Systems:  Pertinent positives and negative per HPI, all others reviewed and negative  Past Medical History:  Diagnosis Date   Anxiety    COPD (chronic obstructive pulmonary disease) (HCC)    Depression    Parkinson's disease (HCC)    Seizures (HCC)    Stroke [place])    Past Surgical History:  Procedure Laterality Date   ANKLE CLOSED REDUCTION Left 09/11/2019   Procedure: CLOSED REDUCTION 2nd toe;  Surgeon: Gwyneth Revels, DPM;  Location: [facility] ORS;  Service: Podiatry;  Laterality: Left;   ORIF ANKLE FRACTURE Right 09/11/2019   Procedure: OPEN REDUCTION INTERNAL FIXATION (ORIF) ANKLE FRACTURE;  Surgeon: Gwyneth Revels, DPM;  Location: [facility] ORS;  Service: Podiatry;  Laterality: Right;   Shoulder surgery     SPINAL FUSION     cervical   TOTAL KNEE ARTHROPLASTY     Social History:  reports that he has quit smoking. He has never used smokeless tobacco. He reports current alcohol use of about 3.0 standard drinks of alcohol per week. He reports that he does not use drugs.  Allergies  Allergen Reactions   Bupropion Other (See Comments)    Seizures  Seizure seizures Seizure    Dilantin [Phenytoin Sodium Extended]     Past issues with toxicity per pt's son   Iodinated  Diagnostic Agents Swelling    Family History  Problem Relation Age of Onset   COPD Father      Prior to Admission medications   Medication Sig Start Date End Date Taking? Authorizing Provider  acetaminophen (TYLENOL) 325 MG tablet Take 2 tablets (650 mg total) by mouth every 6 (six) hours as needed for mild pain (or Fever >/= 101). 09/14/19  Yes Meredeth IdeLama, Gagan S, MD  amLODipine (NORVASC) 10 MG tablet Take 1 tablet (10 mg total) by mouth daily. 06/05/19 07/06/20 Yes Sreenath, Sudheer B, MD  atorvastatin (LIPITOR) 20 MG tablet Take 1 tablet (20 mg total) by mouth daily. 07/11/15  Yes Sharman CheekStafford, Phillip, MD  Cholecalciferol 125 MCG (5000 UT) capsule Take 5,000 Units by mouth daily.   Yes [provider]  clopidogrel (PLAVIX) 75 MG tablet Take 1 tablet (75 mg total) by mouth daily. 07/11/15  Yes Sharman CheekStafford, Phillip, MD  divalproex (DEPAKOTE SPRINKLE) 125 MG capsule Take 2 capsules (250 mg total) by mouth every 12 (twelve) hours. 09/14/19  Yes Meredeth IdeLama, Gagan S, MD  donepezil (ARICEPT) 5 MG tablet Take 5 mg by mouth daily. 01/13/20  Yes [provider]  folic acid (FOLVITE) 1 MG tablet Take 1 tablet (1 mg total) by mouth daily. 09/15/19  Yes Meredeth IdeLama, Gagan S, MD  ibuprofen (ADVIL) 200 MG tablet Take 2 tablets by mouth every 6 (six) hours as needed.   Yes [provider]  lamoTRIgine (LAMICTAL) 150 MG tablet Take 1 tablet (150 mg total) by mouth 2 (two) times daily. 06/04/19 07/06/20 Yes Sreenath, Sudheer B, MD  levETIRAcetam (KEPPRA) 500 MG tablet Take 3 tablets (1,500 mg total) by mouth 2 (two) times daily. 06/04/19 07/06/20 Yes Sreenath, Sudheer B, MD  lisinopril (ZESTRIL) 20 MG tablet Take 1 tablet (20 mg total) by mouth daily. 06/05/19 07/06/20 Yes Sreenath, Sudheer B, MD  Melatonin 10 MG TABS Take 1 tablet by mouth at bedtime. 07/11/15  Yes Sharman CheekStafford, Phillip, MD  Multiple Vitamin (MULTI-VITAMIN) tablet Take 1 tablet by mouth daily.   Yes [provider]  Omega-3 Fatty Acids (RA FISH OIL)  1000 MG CAPS Take 1 capsule by mouth daily.   Yes [provider]  propranolol (INDERAL) 20 MG tablet Take 10 mg by mouth 2 (two) times daily.   Yes [provider]  sertraline (ZOLOFT) 100 MG tablet Take 200 mg by mouth daily. 10/28/18  Yes [provider]  thiamine 100 MG tablet Take 1 tablet (100 mg total) by mouth daily. 09/15/19  Yes Meredeth IdeLama, Gagan S, MD  zinc oxide 20 % ointment Apply 1 application topically as needed for irritation.   Yes [provider]  lactulose (CHRONULAC) 10 GM/15ML solution Take 30 mLs (20 g total) by mouth daily. Patient not taking: Reported on 07/06/2020 09/15/19   Meredeth IdeLama, Gagan S, MD   Physical Exam: Vitals:   07/06/20 1500 07/06/20  1501 07/06/20 1530 07/06/20 1600  BP: (!) 124/57 (!) 124/57 (!) 93/52 (!) 116/52  Pulse: 69 84 (!) 59 64  Resp: (!) 23 18 (!) 21 18  Temp:      TempSrc:      SpO2: 93% 95% 95% 96%  Weight:      Height:        Wt Readings from Last 3 Encounters:  07/06/20 104 kg  09/07/19 104.3 kg  09/06/19 103.8 kg     General:  Appears calm and comfortable Eyes: PERRL, normal lids, irises & conjunctiva ENT: grossly normal hearing, lips & tongue Neck: no masses Cardiovascular: RRR, no m/r/g. No LE edema. Telemetry: SR, no arrhythmias  Respiratory: CTA bilaterally, no w/r/r. Normal respiratory effort. Abdomen: soft, ntnd Skin: no rash or induration seen on limited exam Musculoskeletal: grossly normal tone BUE/BLE Psychiatric: grossly normal mood and affect, speech fluent and appropriate Neurologic: grossly non-focal with exception of random tremors          Labs on Admission:  Basic Metabolic Panel: Recent Labs  Lab 07/06/20 1310  NA 129*  K 3.9  CL 97*  CO2 22  GLUCOSE 121*  BUN 22  CREATININE 1.07  CALCIUM 9.1   Liver Function Tests: Recent Labs  Lab 07/06/20 1310  AST 89*  ALT 24  ALKPHOS 47  BILITOT 1.1  PROT 6.7  ALBUMIN 3.9   No results for input(s): LIPASE, AMYLASE in the  last 168 hours. No results for input(s): AMMONIA in the last 168 hours. CBC: Recent Labs  Lab 07/06/20 1310  WBC 12.8*  NEUTROABS 10.0*  HGB 14.2  HCT 39.1  MCV 90.5  PLT 151   Cardiac Enzymes: No results for input(s): CKTOTAL, CKMB, CKMBINDEX, TROPONINI in the last 168 hours.  BNP (last 3 results) No results for input(s): BNP in the last 8760 hours.  ProBNP (last 3 results) No results for input(s): PROBNP in the last 8760 hours.  CBG: No results for input(s): GLUCAP in the last 168 hours.  Radiological Exams on Admission: MR ANGIO HEAD WO CONTRAST  Result Date: 07/06/2020 CLINICAL DATA:  Neuro deficit, acute stroke suspected. EXAM: MRI HEAD WITHOUT CONTRAST MRA HEAD WITHOUT CONTRAST MRA NECK WITHOUT AND WITH CONTRAST TECHNIQUE: Multiplanar, multi-echo pulse sequences of the brain and surrounding structures were acquired without intravenous contrast. Angiographic images of the Circle of Willis were acquired using MRA technique without intravenous contrast. Angiographic images of the neck were acquired using MRA technique without and with intravenous contrast. Carotid stenosis measurements (when applicable) are obtained utilizing NASCET criteria, using the distal internal carotid diameter as the denominator. CONTRAST:  10mL GADAVIST GADOBUTROL 1 MMOL/ML IV SOLN COMPARISON:  September 10, 2019. FINDINGS: MRI HEAD FINDINGS Brain: No acute infarction, hemorrhage, hydrocephalus, extra-axial collection or mass lesion. No pathologic intracranial enhancement. There are a few T2/FLAIR hyperintensities within the white matter, most likely related to chronic microvascular ischemic disease that is mild for age. Mild atrophy with ex vacuo ventricular dilation. Vascular: See below. Skull and upper cervical spine: Normal marrow signal. Sinuses/Orbits: Mild pansinus mucosal thickening. Unremarkable orbits. Other: No mastoid effusions. MRA HEAD FINDINGS Anterior circulation: No large vessel occlusion or  proximal hemodynamically significant stenosis. No aneurysm. Posterior circulation: No large vessel occlusion or proximal hemodynamically significant stenosis. Bilateral posterior communicating arteries. No aneurysm. MRA NECK FINDINGS Aortic arch: Great vessel origins are patent. Right carotid system: No visible significant (greater than 50%) stenosis. Left carotid system: Mild narrowing at the carotid bifurcation. No visible significant (greater  than 50%) stenosis. Vertebral arteries: Left dominant. Limited evaluation of the origins. No visible significant (greater than 50%) stenosis. IMPRESSION: 1. No evidence of acute intracranial abnormality. 2. No large vessel occlusion or visible hemodynamically significant stenosis. Electronically Signed   By: Feliberto Harts MD   On: 07/06/2020 17:47   MR ANGIO NECK W WO CONTRAST  Result Date: 07/06/2020 CLINICAL DATA:  Neuro deficit, acute stroke suspected. EXAM: MRI HEAD WITHOUT CONTRAST MRA HEAD WITHOUT CONTRAST MRA NECK WITHOUT AND WITH CONTRAST TECHNIQUE: Multiplanar, multi-echo pulse sequences of the brain and surrounding structures were acquired without intravenous contrast. Angiographic images of the Circle of Willis were acquired using MRA technique without intravenous contrast. Angiographic images of the neck were acquired using MRA technique without and with intravenous contrast. Carotid stenosis measurements (when applicable) are obtained utilizing NASCET criteria, using the distal internal carotid diameter as the denominator. CONTRAST:  52mL GADAVIST GADOBUTROL 1 MMOL/ML IV SOLN COMPARISON:  September 10, 2019. FINDINGS: MRI HEAD FINDINGS Brain: No acute infarction, hemorrhage, hydrocephalus, extra-axial collection or mass lesion. No pathologic intracranial enhancement. There are a few T2/FLAIR hyperintensities within the white matter, most likely related to chronic microvascular ischemic disease that is mild for age. Mild atrophy with ex vacuo ventricular  dilation. Vascular: See below. Skull and upper cervical spine: Normal marrow signal. Sinuses/Orbits: Mild pansinus mucosal thickening. Unremarkable orbits. Other: No mastoid effusions. MRA HEAD FINDINGS Anterior circulation: No large vessel occlusion or proximal hemodynamically significant stenosis. No aneurysm. Posterior circulation: No large vessel occlusion or proximal hemodynamically significant stenosis. Bilateral posterior communicating arteries. No aneurysm. MRA NECK FINDINGS Aortic arch: Great vessel origins are patent. Right carotid system: No visible significant (greater than 50%) stenosis. Left carotid system: Mild narrowing at the carotid bifurcation. No visible significant (greater than 50%) stenosis. Vertebral arteries: Left dominant. Limited evaluation of the origins. No visible significant (greater than 50%) stenosis. IMPRESSION: 1. No evidence of acute intracranial abnormality. 2. No large vessel occlusion or visible hemodynamically significant stenosis. Electronically Signed   By: Feliberto Harts MD   On: 07/06/2020 17:47   MR BRAIN WO CONTRAST  Result Date: 07/06/2020 CLINICAL DATA:  Neuro deficit, acute stroke suspected. EXAM: MRI HEAD WITHOUT CONTRAST MRA HEAD WITHOUT CONTRAST MRA NECK WITHOUT AND WITH CONTRAST TECHNIQUE: Multiplanar, multi-echo pulse sequences of the brain and surrounding structures were acquired without intravenous contrast. Angiographic images of the Circle of Willis were acquired using MRA technique without intravenous contrast. Angiographic images of the neck were acquired using MRA technique without and with intravenous contrast. Carotid stenosis measurements (when applicable) are obtained utilizing NASCET criteria, using the distal internal carotid diameter as the denominator. CONTRAST:  84mL GADAVIST GADOBUTROL 1 MMOL/ML IV SOLN COMPARISON:  September 10, 2019. FINDINGS: MRI HEAD FINDINGS Brain: No acute infarction, hemorrhage, hydrocephalus, extra-axial collection or  mass lesion. No pathologic intracranial enhancement. There are a few T2/FLAIR hyperintensities within the white matter, most likely related to chronic microvascular ischemic disease that is mild for age. Mild atrophy with ex vacuo ventricular dilation. Vascular: See below. Skull and upper cervical spine: Normal marrow signal. Sinuses/Orbits: Mild pansinus mucosal thickening. Unremarkable orbits. Other: No mastoid effusions. MRA HEAD FINDINGS Anterior circulation: No large vessel occlusion or proximal hemodynamically significant stenosis. No aneurysm. Posterior circulation: No large vessel occlusion or proximal hemodynamically significant stenosis. Bilateral posterior communicating arteries. No aneurysm. MRA NECK FINDINGS Aortic arch: Great vessel origins are patent. Right carotid system: No visible significant (greater than 50%) stenosis. Left carotid system: Mild narrowing at the carotid bifurcation.  No visible significant (greater than 50%) stenosis. Vertebral arteries: Left dominant. Limited evaluation of the origins. No visible significant (greater than 50%) stenosis. IMPRESSION: 1. No evidence of acute intracranial abnormality. 2. No large vessel occlusion or visible hemodynamically significant stenosis. Electronically Signed   By: Feliberto Harts MD   On: 07/06/2020 17:47   DG Chest Portable 1 View  Result Date: 07/06/2020 CLINICAL DATA:  Reason for exam: Fall Pt has hx of COPD, parkinson's, seizures, and strokes. Pt is a former smoker. fall EXAM: PORTABLE CHEST 1 VIEW COMPARISON:  None. FINDINGS: Normal mediastinum and cardiac silhouette. Normal pulmonary vasculature. No evidence of effusion, infiltrate, or pneumothorax. No acute bony abnormality. IMPRESSION: No acute cardiopulmonary process. Electronically Signed   By: Genevive Bi M.D.   On: 07/06/2020 14:25   CT HEAD CODE STROKE WO CONTRAST  Result Date: 07/06/2020 CLINICAL DATA:  Code stroke.  Right-sided weakness EXAM: CT HEAD WITHOUT  CONTRAST TECHNIQUE: Contiguous axial images were obtained from the base of the skull through the vertex without intravenous contrast. COMPARISON:  09/10/2019 FINDINGS: Brain: There is no acute intracranial hemorrhage, mass effect, or edema. No new loss of gray-white differentiation. Prominence of the ventricles and sulci reflects minor generalized parenchymal volume loss. Patchy hypoattenuation in the supratentorial white matter is nonspecific but may reflect mild chronic microvascular ischemic changes. No extra-axial collection. Vascular: No hyperdense vessel. There is intracranial atherosclerotic calcification at the skull base. Skull: Unremarkable. Sinuses/Orbits: No acute abnormality. Other: Mastoid air cells are clear. ASPECTS ([place] Stroke Program Early CT Score) - Ganglionic level infarction (caudate, lentiform nuclei, internal capsule, insula, M1-M3 cortex): 7 - Supraganglionic infarction (M4-M6 cortex): 3 Total score (0-10 with 10 being normal): 10 IMPRESSION: There is no acute intracranial hemorrhage or evidence of acute infarction. ASPECT score is 10. These results were communicated to Dr. Amada Jupiter at 1:32 pm on 07/06/2020 by text page via the [facility] messaging system. Electronically Signed   By: Guadlupe Spanish M.D.   On: 07/06/2020 13:34    EKG: Independently reviewed.  No ischemic changes, unchanged from prior.  Assessment/Plan Active Problems:   Seizure (HCC)   COPD (chronic obstructive pulmonary disease) (HCC)   Stroke (HCC)   HLD (hyperlipidemia)   HTN (hypertension)   Physical deconditioning   MONTAVIOUS WIERZBA is a 75 y.o. male with history of COPD, seizure disorder, CVA, hyperlipidemia, hypertension, alcohol use disorder in remission, who presents after being found down at his group home this morning in the setting of recent COVID diagnosis.  #Physical deconditioning #Falls #Seizure disorder All imaging is been completed and patient has been ruled out for stroke.  Both  neurologist and myself suspect that he had a seizure event, possibly related to his COVID infection.  Depakote level is low but per neurology recommendations no changes at this time.  Keppra and lamotrigine levels are pending.  Patient unable to ambulate in the ED, will require further evaluation. - PT/OT evaluation in a.m. - Follow-up lamotrigine and Keppra levels - Continue Depakote, lamotrigine, Keppra  #COVID-19 infection Sore throat but otherwise doing well without any severe symptoms, not requiring oxygen.  Was started on molnupiravir as an outpatient this is not available as an inpatient.  Per outpatient notes start of symptoms was 4 days ago, still within the window for treatment. - Start Paxilovid  #Hyponatremia Unclear etiology, possibly due to hypovolemia in setting of COVID infection and possible seizure activity, will rehydrate and recheck in the a.m.  #Elevated AST Likely secondary to COVID infection, trend  CMP.  #Elevated troponin Trended from 31 down to 24 on recheck 5 hours later, may be related to COVID infection versus slight demand from seizure activity.  EKG without ischemic changes.  #Elevated D-dimer Likely secondary to COVID infection.  #Known medical problems Hypertension-continue amlodipine, lisinopril Hyperlipidemia-continue atorvastatin History of CVA-continue Plavix, donepezil History of alcohol use disorder-continue folic acid, multivitamin, thiamine Insomnia-continue melatonin Tremors-continue propranolol Depression-continue sertraline  Code Status: DNR/DNI, confirmed with patient DVT Prophylaxis: Lovenox Family Communication: None Disposition Plan: Inpatient, Med Surg   Time spent: 58 min  Venora Maples MD/[facility]  Note:  This document was prepared using Conservation officer, historic buildings and may include unintentional dictation errors.

## 2020-07-06 NOTE — ED Notes (Signed)
Black coffee provided, no other needs at this time

## 2020-07-06 NOTE — Consult Note (Signed)
Neurology Consultation Reason for Consult:?  Right-sided weakness Referring Physician: Michiel Sites  CC: Unsteadiness  History is obtained from: Patient  HPI: Ian Gill is a 75 y.o. male with a history of seizures, stroke, Parkinson's disease who has been more unsteady over the past few days since being diagnosed with COVID.  He was noticed to be slightly leaning to the right today and therefore EMS was called who noticed a facial weakness and therefore activated a code stroke.  His last known well was initially reported as 9:30 AM, but the patient himself reports that he has been unsteady for at least a few days.  He has been feeling bad and was diagnosed with COVID recently, he thinks towards the beginning of this week or last week.  He reports waking up on the floor this morning, not sure how he got there.   LKW: Several days ago tpa given?: no, outside window   ROS: A 14 point ROS was performed and is negative except as noted in the HPI.   Past Medical History:  Diagnosis Date   Anxiety    COPD (chronic obstructive pulmonary disease) (HCC)    Depression    Parkinson's disease (HCC)    Seizures (HCC)    Stroke (HCC)      Family History  Problem Relation Age of Onset   COPD Father      Social History:  reports that he has quit smoking. He has never used smokeless tobacco. He reports current alcohol use of about 3.0 standard drinks of alcohol per week. He reports that he does not use drugs.   Exam: Current vital signs: Pulse 76   Resp 19   Ht 5\' 11"  (1.803 m)   Wt 104 kg   SpO2 93%   BMI 31.98 kg/m  Vital signs in last 24 hours: Pulse Rate:  [73-76] 76 (06/10 1351) Resp:  [19] 19 (06/10 1349) SpO2:  [93 %] 93 % (06/10 1349) Weight:  [104 kg] 104 kg (06/10 1351)   Physical Exam  Constitutional: Appears well-developed and well-nourished.  Psych: Affect appropriate to situation Eyes: No scleral injection HENT: No OP obstruction MSK: no joint deformities.   Cardiovascular: Normal rate and regular rhythm.  Respiratory: Effort normal, non-labored breathing GI: Soft.  No distension. There is no tenderness.  Skin: WDI  Neuro: Mental Status: Patient is awake, alert, oriented to person, place, month, year, and situation. Patient is able to give a clear and coherent history. No signs of aphasia or neglect Cranial Nerves: II: Visual Fields are full. Pupils are equal, round, and reactive to light.   III,IV, VI: EOMI without ptosis or diploplia.  V: Facial sensation is symmetric to temperature VII: Facial movement with mild right facial weaknessVIII: hearing is intact to voice X: Uvula elevates symmetrically XI: Shoulder shrug is symmetric. XII: tongue is midline without atrophy or fasciculations.  Motor: Bulk is normal. 5/5 strength was present in all four extremities.  He does have tremor of bilateral upper extremities (he states this is normal for him) Sensory: Sensation is symmetric to light touch and temperature in the arms and legs. Cerebellar: No clear ataxia     I have reviewed labs in epic and the results pertinent to this consultation are: Depakote 36  I have reviewed the images obtained: CT head-negative  Impression: 75 year old male with a history of stroke, seizures, Parkinson's disease who presents with unsteadiness for several days as well as waking up on the floor this morning.  It  is unclear if he had an unwitnessed seizure, but this is a possibility.  COVID is a very inflammatory process and therefore I would expect it to lower seizure threshold some.  I am not certain that I would make adjustments to his medication regimen based on this.  His valproic acid level is low, but given that there is discussion in the chart that this is being weaned down I would not favor making adjustments to that at this time.  Certainly with his Parkinson's, increasing unsteadiness with an inflammatory condition like COVID is not  unexpected.  Recommendations: 1) Keppra level, lamotrigine level-these will not be back prior to discharge 2) MRI brain, MRA head and neck 3) if imaging is negative, I do not think I would make any significant changes at this time, but he can follow-up as an outpatient with his neurologist.  If positive, then he will need to be admitted for stroke work-up.   Ritta Slot, MD [facility] [phone]  If 7pm- 7am, please page neurology on call as listed in AMION.

## 2020-07-06 NOTE — ED Notes (Signed)
Pt in MRI. Will reassess VS when he returns

## 2020-07-06 NOTE — Progress Notes (Signed)
On Call Chaplain Maggie responded to code STEMI to ED. Patient was being moved to the Cath Lab. No family was present. Chaplain available for continued support as needed.

## 2020-07-06 NOTE — ED Provider Notes (Signed)
[facility] Emergency Department Provider Note  ____________________________________________   Event Date/Time   First MD Initiated Contact with Patient 07/06/20 1323     (approximate)  I have reviewed the triage vital signs and the nursing notes.   HISTORY  Chief Complaint Code Stroke    HPI Ian Gill is a 75 y.o. male With history of COPD, Parkinson's, seizures, and previous stroke presents with recent history of covid and shortness of breath.  Patient was found on the floor this morning.  He is unsure how he got there.  States he thinks it is just the COVID virus.  He denies chest pain.  Complains of some shortness of breath.  Patient states he is vaccinated for COVID and was given antiviral pills for COVID.  He denies any slurred speech.  States he was able to stand but is a little weak.   Past Medical History:  Diagnosis Date   Anxiety    COPD (chronic obstructive pulmonary disease) (HCC)    Depression    Parkinson's disease (HCC)    Seizures (HCC)    Stroke [facility])     Patient Active Problem List   Diagnosis Date Noted   Hypertensive urgency 09/08/2019   Fall 09/08/2019   COPD (chronic obstructive pulmonary disease) (HCC)    Depression    Stroke (HCC)    HLD (hyperlipidemia)    GERD (gastroesophageal reflux disease)    HTN (hypertension)    Acute metabolic encephalopathy    Postictal state (HCC) 05/31/2019   Seizure (HCC) 05/30/2019   Alcohol use 05/30/2019   Post-ictal state (HCC) 05/30/2019    Past Surgical History:  Procedure Laterality Date   ANKLE CLOSED REDUCTION Left 09/11/2019   Procedure: CLOSED REDUCTION 2nd toe;  Surgeon: Gwyneth Revels, DPM;  Location: [facility] ORS;  Service: Podiatry;  Laterality: Left;   ORIF ANKLE FRACTURE Right 09/11/2019   Procedure: OPEN REDUCTION INTERNAL FIXATION (ORIF) ANKLE FRACTURE;  Surgeon: Gwyneth Revels, DPM;  Location: [facility] ORS;  Service: Podiatry;  Laterality: Right;   Shoulder surgery      SPINAL FUSION     cervical   TOTAL KNEE ARTHROPLASTY      Prior to Admission medications   Medication Sig Start Date End Date Taking? Authorizing Provider  acetaminophen (TYLENOL) 325 MG tablet Take 2 tablets (650 mg total) by mouth every 6 (six) hours as needed for mild pain (or Fever >/= 101). 09/14/19  Yes Meredeth Ide, MD  amLODipine (NORVASC) 10 MG tablet Take 1 tablet (10 mg total) by mouth daily. 06/05/19 07/06/20 Yes Sreenath, Sudheer B, MD  atorvastatin (LIPITOR) 20 MG tablet Take 1 tablet (20 mg total) by mouth daily. 07/11/15  Yes Sharman Cheek, MD  Cholecalciferol 125 MCG (5000 UT) capsule Take 5,000 Units by mouth daily.   Yes [provider]  clopidogrel (PLAVIX) 75 MG tablet Take 1 tablet (75 mg total) by mouth daily. 07/11/15  Yes Sharman Cheek, MD  divalproex (DEPAKOTE SPRINKLE) 125 MG capsule Take 2 capsules (250 mg total) by mouth every 12 (twelve) hours. 09/14/19  Yes Meredeth Ide, MD  donepezil (ARICEPT) 5 MG tablet Take 5 mg by mouth daily. 01/13/20  Yes [provider]  folic acid (FOLVITE) 1 MG tablet Take 1 tablet (1 mg total) by mouth daily. 09/15/19  Yes Meredeth Ide, MD  ibuprofen (ADVIL) 200 MG tablet Take 2 tablets by mouth every 6 (six) hours as needed.   Yes [provider]  lamoTRIgine (LAMICTAL)  150 MG tablet Take 1 tablet (150 mg total) by mouth 2 (two) times daily. 06/04/19 07/06/20 Yes Sreenath, Sudheer B, MD  levETIRAcetam (KEPPRA) 500 MG tablet Take 3 tablets (1,500 mg total) by mouth 2 (two) times daily. 06/04/19 07/06/20 Yes Sreenath, Sudheer B, MD  lisinopril (ZESTRIL) 20 MG tablet Take 1 tablet (20 mg total) by mouth daily. 06/05/19 07/06/20 Yes Sreenath, Sudheer B, MD  Melatonin 10 MG TABS Take 1 tablet by mouth at bedtime. 07/11/15  Yes Sharman Cheek, MD  Multiple Vitamin (MULTI-VITAMIN) tablet Take 1 tablet by mouth daily.   Yes [provider]  Omega-3 Fatty Acids (RA FISH OIL) 1000 MG CAPS Take 1 capsule by  mouth daily.   Yes [provider]  propranolol (INDERAL) 20 MG tablet Take 10 mg by mouth 2 (two) times daily.   Yes [provider]  sertraline (ZOLOFT) 100 MG tablet Take 200 mg by mouth daily. 10/28/18  Yes [provider]  thiamine 100 MG tablet Take 1 tablet (100 mg total) by mouth daily. 09/15/19  Yes Meredeth Ide, MD  zinc oxide 20 % ointment Apply 1 application topically as needed for irritation.   Yes [provider]  lactulose (CHRONULAC) 10 GM/15ML solution Take 30 mLs (20 g total) by mouth daily. Patient not taking: Reported on 07/06/2020 09/15/19   Meredeth Ide, MD    Allergies Bupropion, Dilantin [phenytoin sodium extended], and Iodinated diagnostic agents  Family History  Problem Relation Age of Onset   COPD Father     Social History Social History   Tobacco Use   Smoking status: Former    Pack years: 0.00   Smokeless tobacco: Never  Vaping Use   Vaping Use: Never used  Substance Use Topics   Alcohol use: Yes    Alcohol/week: 3.0 standard drinks    Types: 2 Shots of liquor, 1 Cans of beer per week   Drug use: No    Review of Systems  Constitutional: No fever/chills Eyes: No visual changes. ENT: No sore throat. Respiratory: Positive shortness of breath and cough Cardiovascular: Denies chest pain Gastrointestinal: Denies abdominal pain Genitourinary: Negative for dysuria. Musculoskeletal: Negative for back pain. Neuro: No slurred speech, headache, question if he had a seizure last night Skin: Negative for rash. Psychiatric: no mood changes,     ____________________________________________   PHYSICAL EXAM:  VITAL SIGNS: ED Triage Vitals  Enc Vitals Group     BP      Pulse      Resp      Temp      Temp src      SpO2      Weight      Height      Head Circumference      Peak Flow      Pain Score      Pain Loc      Pain Edu?      Excl. in GC?     Constitutional: Alert and oriented. Well appearing and  in no acute distress. Eyes: Conjunctivae are normal.  PERRL Head: Atraumatic. Nose: No congestion/rhinnorhea. Mouth/Throat: Mucous membranes are moist.   Neck:  supple no lymphadenopathy noted Cardiovascular: Normal rate, regular rhythm. Heart sounds are normal Respiratory: Normal respiratory effort.  No retractions, lungs c wheezing Abd: soft nontender bs normal all 4 quad GU: deferred Musculoskeletal: FROM all extremities, warm and well perfused Neurologic:  Normal speech and language.  Grips 5/5 and equal bilaterally, lower extremities have 5/5 strength  bilaterally, great toes have 5/5 strength bilaterally, cranial nerves II through XII grossly intact Skin:  Skin is warm, dry and intact. No rash noted. Psychiatric: Mood and affect are normal. Speech and behavior are normal.  ____________________________________________   LABS (all labs ordered are listed, but only abnormal results are displayed)  Labs Reviewed  CBC - Abnormal; Notable for the following components:      Result Value   WBC 12.8 (*)    MCHC 36.3 (*)    All other components within normal limits  DIFFERENTIAL - Abnormal; Notable for the following components:   Neutro Abs 10.0 (*)    Monocytes Absolute 1.8 (*)    All other components within normal limits  COMPREHENSIVE METABOLIC PANEL - Abnormal; Notable for the following components:   Sodium 129 (*)    Chloride 97 (*)    Glucose, Bld 121 (*)    AST 89 (*)    All other components within normal limits  VALPROIC ACID LEVEL - Abnormal; Notable for the following components:   Valproic Acid Lvl 36 (*)    All other components within normal limits  TROPONIN I (HIGH SENSITIVITY) - Abnormal; Notable for the following components:   Troponin I (High Sensitivity) 31 (*)    All other components within normal limits  PROTIME-INR  APTT  LAMOTRIGINE LEVEL  LEVETIRACETAM LEVEL  D-DIMER, QUANTITATIVE  CBG MONITORING, ED  TROPONIN I (HIGH SENSITIVITY)    ____________________________________________   ____________________________________________  RADIOLOGY  CT of the head Chest x-ray MRI of the brain  ____________________________________________   PROCEDURES  Procedure(s) performed: No  .Critical Care E&M  Date/Time: 07/06/2020 2:21 PM Performed by: Faythe Ghee, PA-C  Critical care provider statement:    Critical care time (minutes):  45   Critical care time was exclusive of:  Separately billable procedures and treating other patients   Critical care was necessary to treat or prevent imminent or life-threatening deterioration of the following conditions:  Respiratory failure   Critical care was time spent personally by me on the following activities:  Blood draw for specimens, development of treatment plan with patient or surrogate, examination of patient, obtaining history from patient or surrogate, ordering and performing treatments and interventions, ordering and review of laboratory studies, ordering and review of radiographic studies, pulse oximetry, re-evaluation of patient's condition and review of old charts After initial E/M assessment, critical care services were subsequently performed that were exclusive of separately billable procedures or treatment.      ____________________________________________   INITIAL IMPRESSION / ASSESSMENT AND PLAN / ED COURSE  Pertinent labs & imaging results that were available during my care of the patient were reviewed by me and considered in my medical decision making (see chart for details).   Patient is a 75 year old male with history of COPD presents with recent COVID diagnosis along with shortness of breath.  Patient was found on the floor this morning.  DDx: COVID-pneumonia, acute respiratory distress secondary covid, seizure, CVA  CBC with elevated WBC of 12.8, sodium is a little low at 129 which is not his normal trend, AST is elevated, PT and PTT are normal,  CT  of the head for code stroke protocol, radiologist reads as negative  Chest x-ray reviewed by me confirmed by radiology to be negative.  Troponins elevated 31, valproic acid level is depressed at 36,  Attempted to have the patient sit on the side of the bed so we could ambulate him.  He is too weak to sit  up.  Will page hospitalist for admission.  Neurologist still has pending code stroke orders and.   Ian Gill was evaluated in Emergency Department on 07/06/2020 for the symptoms described in the history of present illness. He was evaluated in the context of the global COVID-19 pandemic, which necessitated consideration that the patient might be at risk for infection with the SARS-CoV-2 virus that causes COVID-19. Institutional protocols and algorithms that pertain to the evaluation of patients at risk for COVID-19 are in a state of rapid change based on information released by regulatory bodies including the CDC and federal and state organizations. These policies and algorithms were followed during the patient's care in the ED.    As part of my medical decision making, I reviewed the following data within the electronic MEDICAL RECORD NUMBER Nursing notes reviewed and incorporated, Labs reviewed , EKG interpreted see physician read, Old chart reviewed, Radiograph reviewed , Discussed with admitting physician , Evaluated by EM attending Dr. Katrinka BlazingSmith, consult to neurology on arrival, Notes from prior ED visits, and [place] Controlled Substance Database  ____________________________________________   FINAL CLINICAL IMPRESSION(S) / ED DIAGNOSES  Final diagnoses:  COVID-19  Weakness      NEW MEDICATIONS STARTED DURING THIS VISIT:  New Prescriptions   No medications on file     Note:  This document was prepared using Dragon voice recognition software and may include unintentional dictation errors.    Faythe GheeFisher, Alexandra Lipps W, PA-C 07/06/20 1503    Gilles ChiquitoSmith, Zachary P, MD 07/08/20 [phone]

## 2020-07-06 NOTE — ED Notes (Addendum)
Neurology at bedside in CT at this time.

## 2020-07-06 NOTE — ED Notes (Signed)
Patient transported to CT 

## 2020-07-06 NOTE — ED Triage Notes (Signed)
Pt arrived via [facility] from [facility]. Per EMS, originally called out for SOB and R sided weakness.   Per EMS pt was 90% on RA, placed pt on 2L to be at 93%  Per EMS, pt was dx as covid positive a few days ago.  Per EMS CBG 128, BP 121/65, HR 88, Temp 100.89F   Pt alert and able to answer questions at this time.

## 2020-07-06 NOTE — ED Notes (Signed)
primofit leaking, pericare provided, brief changed

## 2020-07-07 ENCOUNTER — Encounter: Payer: Self-pay | Admitting: Family Medicine

## 2020-07-07 ENCOUNTER — Other Ambulatory Visit: Payer: Self-pay

## 2020-07-07 DIAGNOSIS — R5381 Other malaise: Secondary | ICD-10-CM

## 2020-07-07 LAB — COMPREHENSIVE METABOLIC PANEL
ALT: 28 U/L (ref 0–44)
AST: 113 U/L — ABNORMAL HIGH (ref 15–41)
Albumin: 3.7 g/dL (ref 3.5–5.0)
Alkaline Phosphatase: 48 U/L (ref 38–126)
Anion gap: 10 (ref 5–15)
BUN: 18 mg/dL (ref 8–23)
CO2: 24 mmol/L (ref 22–32)
Calcium: 8.8 mg/dL — ABNORMAL LOW (ref 8.9–10.3)
Chloride: 98 mmol/L (ref 98–111)
Creatinine, Ser: 0.8 mg/dL (ref 0.61–1.24)
GFR, Estimated: >60 mL/min (ref >60)
Glucose, Bld: 101 mg/dL — ABNORMAL HIGH (ref 70–99)
Potassium: 3.7 mmol/L (ref 3.5–5.1)
Sodium: 132 mmol/L — ABNORMAL LOW (ref 135–145)
Total Bilirubin: 1.1 mg/dL (ref 0.3–1.2)
Total Protein: 6.3 g/dL — ABNORMAL LOW (ref 6.5–8.1)

## 2020-07-07 LAB — CBC
HCT: 39.1 % (ref 39.0–52.0)
Hemoglobin: 14 g/dL (ref 13.0–17.0)
MCH: 32.6 pg (ref 26.0–34.0)
MCHC: 35.8 g/dL (ref 30.0–36.0)
MCV: 90.9 fL (ref 80.0–100.0)
Platelets: 148 10*3/uL — ABNORMAL LOW (ref 150–400)
RBC: 4.3 MIL/uL (ref 4.22–5.81)
RDW: 11.8 % (ref 11.5–15.5)
WBC: 8.5 10*3/uL (ref 4.0–10.5)
nRBC: 0 % (ref 0.0–0.2)

## 2020-07-07 MED ORDER — DIVALPROEX SODIUM 125 MG PO CSDR
250.0000 mg | DELAYED_RELEASE_CAPSULE | Freq: Two times a day (BID) | ORAL | Status: DC
  Administered 2020-07-07 – 2020-07-10 (×7): 250 mg via ORAL
  Filled 2020-07-07 (×8): qty 2

## 2020-07-07 MED ORDER — THIAMINE HCL 100 MG PO TABS
100.0000 mg | ORAL_TABLET | Freq: Every day | ORAL | Status: DC
  Administered 2020-07-07 – 2020-07-10 (×4): 100 mg via ORAL
  Filled 2020-07-07 (×4): qty 1

## 2020-07-07 MED ORDER — ACETAMINOPHEN 325 MG PO TABS
650.0000 mg | ORAL_TABLET | Freq: Four times a day (QID) | ORAL | Status: DC | PRN
  Administered 2020-07-07: 05:00:00 650 mg via ORAL
  Filled 2020-07-07 (×2): qty 2

## 2020-07-07 MED ORDER — ONDANSETRON HCL 4 MG/2ML IJ SOLN: 4.0000 mg | Freq: Four times a day (QID) | INTRAMUSCULAR | Status: DC | PRN

## 2020-07-07 MED ORDER — LEVETIRACETAM 750 MG PO TABS
1500.0000 mg | ORAL_TABLET | Freq: Two times a day (BID) | ORAL | Status: DC
  Administered 2020-07-07 – 2020-07-10 (×7): 1500 mg via ORAL
  Filled 2020-07-07 (×9): qty 2

## 2020-07-07 MED ORDER — PROPRANOLOL HCL 10 MG PO TABS
10.0000 mg | ORAL_TABLET | Freq: Two times a day (BID) | ORAL | Status: DC
  Administered 2020-07-07 – 2020-07-10 (×7): 10 mg via ORAL
  Filled 2020-07-07 (×9): qty 1

## 2020-07-07 MED ORDER — POLYETHYLENE GLYCOL 3350 17 G PO PACK: 17.0000 g | PACK | Freq: Every day | ORAL | Status: DC | PRN

## 2020-07-07 MED ORDER — NIRMATRELVIR/RITONAVIR (PAXLOVID)TABLET
3.0000 | ORAL_TABLET | Freq: Two times a day (BID) | ORAL | Status: DC
  Administered 2020-07-07 – 2020-07-10 (×7): 3 via ORAL
  Filled 2020-07-07: qty 3

## 2020-07-07 MED ORDER — LAMOTRIGINE 100 MG PO TABS
150.0000 mg | ORAL_TABLET | Freq: Two times a day (BID) | ORAL | Status: DC
  Administered 2020-07-07 – 2020-07-10 (×7): 150 mg via ORAL
  Filled 2020-07-07 (×7): qty 2

## 2020-07-07 MED ORDER — ENOXAPARIN SODIUM 60 MG/0.6ML IJ SOSY
0.5000 mg/kg | PREFILLED_SYRINGE | INTRAMUSCULAR | Status: DC
  Administered 2020-07-07 – 2020-07-10 (×4): 50 mg via SUBCUTANEOUS
  Filled 2020-07-07 (×4): qty 0.6

## 2020-07-07 MED ORDER — METHOCARBAMOL 1000 MG/10ML IJ SOLN
500.0000 mg | Freq: Four times a day (QID) | INTRAMUSCULAR | Status: DC | PRN
  Filled 2020-07-07: qty 5

## 2020-07-07 MED ORDER — ONDANSETRON HCL 4 MG PO TABS: 4.0000 mg | ORAL_TABLET | Freq: Four times a day (QID) | ORAL | Status: DC | PRN

## 2020-07-07 MED ORDER — OXYCODONE HCL 5 MG PO TABS: 5.0000 mg | ORAL_TABLET | ORAL | Status: DC | PRN

## 2020-07-07 MED ORDER — AMLODIPINE BESYLATE 10 MG PO TABS
10.0000 mg | ORAL_TABLET | Freq: Every day | ORAL | Status: DC
  Administered 2020-07-07 – 2020-07-10 (×4): 10 mg via ORAL
  Filled 2020-07-07 (×4): qty 1

## 2020-07-07 MED ORDER — FOLIC ACID 1 MG PO TABS
1.0000 mg | ORAL_TABLET | Freq: Every day | ORAL | Status: DC
  Administered 2020-07-07 – 2020-07-10 (×4): 1 mg via ORAL
  Filled 2020-07-07 (×4): qty 1

## 2020-07-07 MED ORDER — ADULT MULTIVITAMIN W/MINERALS CH
1.0000 | ORAL_TABLET | Freq: Every day | ORAL | Status: DC
  Administered 2020-07-07 – 2020-07-10 (×4): 1 via ORAL
  Filled 2020-07-07 (×4): qty 1

## 2020-07-07 MED ORDER — VITAMIN D 25 MCG (1000 UNIT) PO TABS
5000.0000 [IU] | ORAL_TABLET | Freq: Every day | ORAL | Status: DC
  Administered 2020-07-07 – 2020-07-10 (×4): 5000 [IU] via ORAL
  Filled 2020-07-07 (×4): qty 5

## 2020-07-07 MED ORDER — TRAZODONE HCL 50 MG PO TABS
50.0000 mg | ORAL_TABLET | Freq: Every evening | ORAL | Status: DC | PRN
  Administered 2020-07-09: 22:00:00 50 mg via ORAL
  Filled 2020-07-07: qty 1

## 2020-07-07 MED ORDER — SERTRALINE HCL 50 MG PO TABS
200.0000 mg | ORAL_TABLET | Freq: Every day | ORAL | Status: DC
  Administered 2020-07-07 – 2020-07-10 (×4): 200 mg via ORAL
  Filled 2020-07-07 (×4): qty 4

## 2020-07-07 MED ORDER — DONEPEZIL HCL 5 MG PO TABS
5.0000 mg | ORAL_TABLET | Freq: Every day | ORAL | Status: DC
  Administered 2020-07-07 – 2020-07-10 (×4): 5 mg via ORAL
  Filled 2020-07-07 (×4): qty 1

## 2020-07-07 MED ORDER — LISINOPRIL 20 MG PO TABS
20.0000 mg | ORAL_TABLET | Freq: Every day | ORAL | Status: DC
  Administered 2020-07-07 – 2020-07-10 (×4): 20 mg via ORAL
  Filled 2020-07-07 (×4): qty 1

## 2020-07-07 MED ORDER — CLOPIDOGREL BISULFATE 75 MG PO TABS
75.0000 mg | ORAL_TABLET | Freq: Every day | ORAL | Status: DC
  Administered 2020-07-07 – 2020-07-10 (×4): 75 mg via ORAL
  Filled 2020-07-07 (×4): qty 1

## 2020-07-07 MED ORDER — ATORVASTATIN CALCIUM 20 MG PO TABS
20.0000 mg | ORAL_TABLET | Freq: Every day | ORAL | Status: DC
  Administered 2020-07-07 – 2020-07-09 (×3): 20 mg via ORAL
  Filled 2020-07-07 (×4): qty 1

## 2020-07-07 MED ORDER — ACETAMINOPHEN 650 MG RE SUPP
650.0000 mg | Freq: Four times a day (QID) | RECTAL | Status: DC | PRN
  Filled 2020-07-07: qty 1

## 2020-07-07 MED ORDER — MELATONIN 5 MG PO TABS
10.0000 mg | ORAL_TABLET | Freq: Every day | ORAL | Status: DC
  Administered 2020-07-07 – 2020-07-09 (×3): 10 mg via ORAL
  Filled 2020-07-07 (×4): qty 2

## 2020-07-07 MED ORDER — PNEUMOCOCCAL VAC POLYVALENT 25 MCG/0.5ML IJ INJ: 0.5000 mL | INJECTION | INTRAMUSCULAR | Status: DC

## 2020-07-07 NOTE — Progress Notes (Signed)
Anticoagulation monitoring(Lovenox):  75 yo male ordered Lovenox 40 mg Q24h    Filed Weights   07/06/20 1351 07/06/20 2303  Weight: 104 kg (229 lb 4.5 oz) 97.6 kg (215 lb 2.7 oz)   BMI  30.01   Lab Results  Component Value Date   CREATININE 1.07 07/06/2020   CREATININE 0.91 01/13/2020   CREATININE 0.85 09/12/2019   Estimated Creatinine Clearance: 69.9 mL/min (by C-G formula based on SCr of 1.07 mg/dL). Hemoglobin & Hematocrit     Component Value Date/Time   HGB 14.2 07/06/2020 1310   HGB 14.0 02/15/2014 2016   HCT 39.1 07/06/2020 1310   HCT 42.2 02/15/2014 2016     Per Protocol for Patient with estCrcl > 30 ml/min and BMI > 40, will transition to Lovenox 50 mg Q24h.

## 2020-07-07 NOTE — Evaluation (Signed)
Physical Therapy Evaluation Patient Details Name: Ian Gill MRN: [id number] DOB: 1945-02-09 Today's Date: 07/07/2020   History of Present Illness  Pt is a 75 y/o M with PMH: HTN, HLD, COPD, stroke, GERD, depression, anxiety, Parkinson's, alcohol abuse, and R ankle ORIF in 2021. Pt presented d/t weakness and falls at his facility in setting of recent COVID diagnosis.  Clinical Impression  Pt was willing to work with PT but did keep eyes closed much of the session.  He repeatedly spoke about how much weaker and less energy he has had over the last week.  Pt generally was able to push himself to do some in-room ambulation/mobility but was leaning heavily forward ont he walker, struggled to stay inside the base of support and needed consistent assist to manage walker appropriately (especially during turns).  Unsure of actual baseline, but pt clearly needs work with PT when he returns to his facility to increase safety and activity tolerance.      Follow Up Recommendations SNF    Equipment Recommendations  None recommended by PT    Recommendations for Other Services       Precautions / Restrictions Precautions Precautions: Fall Restrictions Weight Bearing Restrictions: No      Mobility  Bed Mobility Overal bed mobility: Needs Assistance Bed Mobility: Supine to Sit;Sit to Supine     Supine to sit: Min assist;HOB elevated Sit to supine: Min assist   General bed mobility comments: Pt showed good overall effort and though he did not need a lot of assist he was unable to transition either way w/o some    Transfers Overall transfer level: Needs assistance Equipment used: Rolling walker (2 wheeled) Transfers: Sit to/from [employer] Sit to Stand: Min assist         General transfer comment: Pt able to rise with only light assit to keep weight shifting forward, cues for UE use  Ambulation/Gait Ambulation/Gait assistance: Min guard Gait Distance (Feet): 90  Feet Assistive device: Rolling walker (2 wheeled)       General Gait Details: multiple loops in the room.  Pt with consistent veering with walker to the R while feet drifted outside BOS to the L.  He was very reliant on the walker and leaned heavily on it.  Did not have any LOBs but general unsteadiness t/o the entire effort.  Pt's O2 remained stable in the mid 90s but he did have increasing subjective and objective signs of fatigue with increased distance.  Stairs            Wheelchair Mobility    Modified Rankin (Stroke Patients Only)       Balance Overall balance assessment: Needs assistance Sitting-balance support: Feet supported Sitting balance-Leahy Scale: Good       Standing balance-Leahy Scale: Fair Standing balance comment: Pt requires heavy UE support on RW.  struggled to stay inside walke with any dynamic standing acts                             Pertinent Vitals/Pain Pain Assessment: No/denies pain    Home Living Family/patient expects to be discharged to:: Skilled nursing facility                      Prior Function Level of Independence: Needs assistance   Gait / Transfers Assistance Needed: uses wheelchair or walker  ADL's / Homemaking Assistance Needed: Assist from facility for most IADLs and  seems to be some assistance with bathing/dressing although this is somewhat unclear as pt is hypophonic and describes a sore throat and provides limited information.        Hand Dominance        Extremity/Trunk Assessment   Upper Extremity Assessment Upper Extremity Assessment: Generalized weakness (h/o R total shoudler, b/l UEs grossly = with limited shoulder elevation to ~100)    Lower Extremity Assessment Lower Extremity Assessment: Generalized weakness       Communication   Communication: No difficulties  Cognition Arousal/Alertness: Lethargic Behavior During Therapy: WFL for tasks assessed/performed Overall Cognitive  Status: Within Functional Limits for tasks assessed                                        General Comments      Exercises     Assessment/Plan    PT Assessment Patient needs continued PT services  PT Problem List Decreased strength;Decreased range of motion;Decreased activity tolerance;Decreased balance;Decreased mobility;Decreased knowledge of use of DME;Decreased safety awareness;Cardiopulmonary status limiting activity       PT Treatment Interventions DME instruction;Gait training;Stair training;Functional mobility training;Therapeutic activities;Therapeutic exercise;Balance training;Patient/family education    PT Goals (Current goals can be found in the Care Plan section)  Acute Rehab PT Goals Patient Stated Goal: to get stronger PT Goal Formulation: With patient Time For Goal Achievement: 07/21/20 Potential to Achieve Goals: Fair    Frequency Min 2X/week   Barriers to discharge        Co-evaluation               AM-PAC PT "6 Clicks" Mobility  Outcome Measure Help needed turning from your back to your side while in a flat bed without using bedrails?: A Little Help needed moving from lying on your back to sitting on the side of a flat bed without using bedrails?: A Little Help needed moving to and from a bed to a chair (including a wheelchair)?: A Little Help needed standing up from a chair using your arms (e.g., wheelchair or bedside chair)?: A Little Help needed to walk in hospital room?: A Lot Help needed climbing 3-5 steps with a railing? : Total 6 Click Score: 15    End of Session Equipment Utilized During Treatment: Gait belt Activity Tolerance: Patient tolerated treatment well;Patient limited by fatigue Patient left: with bed alarm set;with call bell/phone within reach Nurse Communication: Mobility status PT Visit Diagnosis: Muscle weakness (generalized) (M62.81);Difficulty in walking, not elsewhere classified (R26.2)    Time:  1415-1450 PT Time Calculation (min) (ACUTE ONLY): 35 min   Charges:   PT Evaluation $PT Eval Low Complexity: 1 Low PT Treatments $Gait Training: 8-22 mins        Malachi Pro, DPT 07/07/2020, 4:26 PM

## 2020-07-07 NOTE — Progress Notes (Signed)
Noted negative imaging. I suspect that his worsened gait is due to exacerbation of his underlying neurological issues(previous strokes and parkinsonism) in the setting of a physiological stress(covid). I would not change his anti-epileptic medications unless he has further seizure activity.   Unless he continues to worsen, no further recommendations other than PT/OT at this time and neurology will be available on an as needed basis.   Ritta Slot, MD [facility] [phone]  If 7pm- 7am, please page neurology on call as listed in AMION.

## 2020-07-07 NOTE — Progress Notes (Addendum)
Progress Note    Ian Gill  GEZ:[id number] DOB: [birth year]  DOA: 07/06/2020 PCP: Barbette Reichmann, MD      Brief Narrative:    Medical records reviewed and are as summarized below:  Ian Gill is a 75 y.o. male with medical history significant for COPD, seizure disorder, stroke, Parkinson's disease, hyperlipidemia, hypertension, alcohol use disorder, recently diagnosed with COVID-19 infection, who was brought to the hospital because of unwitnessed fall.  Reportedly, he was found on the floor at the group home. It is not clear whether he had a seizure prior to the fall.  He was admitted to the hospital for fall and suspected seizure.  Acute stroke was ruled out with MRI brain.    Assessment/Plan:   Active Problems:   Seizure (HCC)   COPD (chronic obstructive pulmonary disease) (HCC)   Stroke (HCC)   HLD (hyperlipidemia)   HTN (hypertension)   Physical deconditioning   Body mass index is 30.01 kg/m.   Seizure disorder: Continue antiepileptics.  Patient has been evaluated by neurologist and no changes in antiepileptics are being made.  No evidence of stroke on MRI brain.  Fever, COVID-19 infection: Reportedly tested positive on 07/04/2020 and had been started on Molnupiravir.  Continue Paxilovid.  He had fever on admission.  Monitor fever curve.  No antibiotics for now.  Hyponatremia: Improving.  Mildly elevated troponins: This is likely due to demand ischemia.  No chest pain.  History of stroke and Parkinson's disease: He is on Plavix  Debility, falls: PT and OT.  He will be discharged to SNF.  Other comorbidities include COPD, hypertension, hyperlipidemia, alcohol use disorder, depression     Diet Order             Diet Heart Room service appropriate? Yes; Fluid consistency: Thin  Diet effective now                      Consultants: Neurologist  Procedures: None    Medications:    amLODipine  10 mg Oral Daily   atorvastatin   20 mg Oral Daily   cholecalciferol  5,000 Units Oral Daily   clopidogrel  75 mg Oral Daily   divalproex  250 mg Oral Q12H   donepezil  5 mg Oral Daily   enoxaparin (LOVENOX) injection  0.5 mg/kg Subcutaneous Q24H   folic acid  1 mg Oral Daily   lamoTRIgine  150 mg Oral BID   levETIRAcetam  1,500 mg Oral BID   lisinopril  20 mg Oral Daily   melatonin  10 mg Oral QHS   multivitamin with minerals  1 tablet Oral Daily   nirmatrelvir/ritonavir EUA  3 tablet Oral BID   [START ON 07/08/2020] pneumococcal 23 valent vaccine  0.5 mL Intramuscular Tomorrow-1000   propranolol  10 mg Oral BID   sertraline  200 mg Oral Daily   thiamine  100 mg Oral Daily   Continuous Infusions:  methocarbamol (ROBAXIN) IV       Anti-infectives (From admission, onward)    Start     Dose/Rate Route Frequency Ordered Stop   07/07/20 1000  nirmatrelvir/ritonavir EUA (PAXLOVID) TABS 3 tablet        3 tablet Oral 2 times daily 07/07/20 0307 07/12/20 0959              Family Communication/Anticipated D/C date and plan/Code Status   DVT prophylaxis:      Code Status: DNR  Family Communication: None Disposition  Plan:    Status is: Inpatient  Remains inpatient appropriate because:Inpatient level of care appropriate due to severity of illness  Dispo: The patient is from: Group home              Anticipated d/c is to: SNF              Patient currently is not medically stable to d/c.   Difficult to place patient No           Subjective:   Interval events noted.  He does not remember the circumstances surrounding the fall.  Objective:    Vitals:   07/07/20 0501 07/07/20 0600 07/07/20 0953 07/07/20 1259  BP: (!) 147/59  134/62 130/69  Pulse: 94  81 79  Resp: 18  20 18   Temp: (!) 101.1 F (38.4 C) 99.6 F (37.6 C) 98 F (36.7 C) 98.8 F (37.1 C)  TempSrc: Oral Oral    SpO2: 94%  93% 93%  Weight:      Height:       No data found.   Intake/Output Summary (Last 24 hours)  at 07/07/2020 1434 Last data filed at 07/07/2020 1009 Gross per 24 hour  Intake 480 ml  Output 500 ml  Net -20 ml   Filed Weights   07/06/20 1351 07/06/20 2303  Weight: 104 kg 97.6 kg    Exam:  GEN: NAD SKIN: Warm and dry EYES: EOMI ENT: MMM CV: RRR PULM: CTA B ABD: soft, ND, NT, +BS CNS: AAO x 3, non focal EXT: No edema or tenderness        Data Reviewed:   I have personally reviewed following labs and imaging studies:  Labs: Labs show the following:   Basic Metabolic Panel: Recent Labs  Lab 07/06/20 1310 07/07/20 0511  NA 129* 132*  K 3.9 3.7  CL 97* 98  CO2 22 24  GLUCOSE 121* 101*  BUN 22 18  CREATININE 1.07 0.80  CALCIUM 9.1 8.8*   GFR Estimated Creatinine Clearance: 93.6 mL/min (by C-G formula based on SCr of 0.8 mg/dL). Liver Function Tests: Recent Labs  Lab 07/06/20 1310 07/07/20 0511  AST 89* 113*  ALT 24 28  ALKPHOS 47 48  BILITOT 1.1 1.1  PROT 6.7 6.3*  ALBUMIN 3.9 3.7   No results for input(s): LIPASE, AMYLASE in the last 168 hours. No results for input(s): AMMONIA in the last 168 hours. Coagulation profile Recent Labs  Lab 07/06/20 1310  INR 1.1    CBC: Recent Labs  Lab 07/06/20 1310 07/07/20 0511  WBC 12.8* 8.5  NEUTROABS 10.0*  --   HGB 14.2 14.0  HCT 39.1 39.1  MCV 90.5 90.9  PLT 151 148*   Cardiac Enzymes: No results for input(s): CKTOTAL, CKMB, CKMBINDEX, TROPONINI in the last 168 hours. BNP (last 3 results) No results for input(s): PROBNP in the last 8760 hours. CBG: No results for input(s): GLUCAP in the last 168 hours. D-Dimer: Recent Labs    07/06/20 1310  DDIMER 1.59*   Hgb A1c: No results for input(s): HGBA1C in the last 72 hours. Lipid Profile: No results for input(s): CHOL, HDL, LDLCALC, TRIG, CHOLHDL, LDLDIRECT in the last 72 hours. Thyroid function studies: No results for input(s): TSH, T4TOTAL, T3FREE, THYROIDAB in the last 72 hours.  Invalid input(s): FREET3 Anemia work up: No results  for input(s): VITAMINB12, FOLATE, FERRITIN, TIBC, IRON, RETICCTPCT in the last 72 hours. Sepsis Labs: Recent Labs  Lab 07/06/20 1310 07/07/20 0511  WBC 12.8*  8.5    Microbiology Recent Results (from the past 240 hour(s))  Resp Panel by RT-PCR (Flu A&B, Covid) Nasopharyngeal Swab     Status: Abnormal   Collection Time: 07/06/20  6:07 PM   Specimen: Nasopharyngeal Swab; Nasopharyngeal(NP) swabs in vial transport medium  Result Value Ref Range Status   SARS Coronavirus 2 by RT PCR POSITIVE (A) NEGATIVE Final    Comment: RESULT CALLED TO, READ BACK BY AND VERIFIED WITH: DEE GIVENS 1909 07/06/2020 [facility] (NOTE) SARS-CoV-2 target nucleic acids are DETECTED.  The SARS-CoV-2 RNA is generally detectable in upper respiratory specimens during the acute phase of infection. Positive results are indicative of the presence of the identified virus, but do not rule out bacterial infection or co-infection with other pathogens not detected by the test. Clinical correlation with patient history and other diagnostic information is necessary to determine patient infection status. The expected result is Negative.  Fact Sheet for Patients: BloggerCourse.com  Fact Sheet for Healthcare Providers: SeriousBroker.it  This test is not yet approved or cleared by the [place] FDA and  has been authorized for detection and/or diagnosis of SARS-CoV-2 by FDA under an Emergency Use Authorization (EUA).  This EUA will remain in effect (meaning this test can be [place] ed) for the duration of  the COVID-19 declaration under Section 564(b)(1) of the Act, 21 U.S.C. section 360bbb-3(b)(1), unless the authorization is terminated or revoked sooner.     Influenza A by PCR NEGATIVE NEGATIVE Final   Influenza B by PCR NEGATIVE NEGATIVE Final    Comment: (NOTE) The Xpert Xpress SARS-CoV-2/FLU/RSV plus assay is intended as an aid in the diagnosis of influenza from  Nasopharyngeal swab specimens and should not be used as a sole basis for treatment. Nasal washings and aspirates are unacceptable for Xpert Xpress SARS-CoV-2/FLU/RSV testing.  Fact Sheet for Patients: BloggerCourse.com  Fact Sheet for Healthcare Providers: SeriousBroker.it  This test is not yet approved or cleared by the [place] FDA and has been authorized for detection and/or diagnosis of SARS-CoV-2 by FDA under an Emergency Use Authorization (EUA). This EUA will remain in effect (meaning this test can be used) for the duration of the COVID-19 declaration under Section 564(b)(1) of the Act, 21 U.S.C. section 360bbb-3(b)(1), unless the authorization is terminated or revoked.  Performed at [facility], [address] [place], [place] [address]     Procedures and diagnostic studies:  MR ANGIO HEAD WO CONTRAST  Result Date: 07/06/2020 CLINICAL DATA:  Neuro deficit, acute stroke suspected. EXAM: MRI HEAD WITHOUT CONTRAST MRA HEAD WITHOUT CONTRAST MRA NECK WITHOUT AND WITH CONTRAST TECHNIQUE: Multiplanar, multi-echo pulse sequences of the brain and surrounding structures were acquired without intravenous contrast. Angiographic images of the Circle of Willis were acquired using MRA technique without intravenous contrast. Angiographic images of the neck were acquired using MRA technique without and with intravenous contrast. Carotid stenosis measurements (when applicable) are obtained utilizing NASCET criteria, using the distal internal carotid diameter as the denominator. CONTRAST:  62mL GADAVIST GADOBUTROL 1 MMOL/ML IV SOLN COMPARISON:  September 10, 2019. FINDINGS: MRI HEAD FINDINGS Brain: No acute infarction, hemorrhage, hydrocephalus, extra-axial collection or mass lesion. No pathologic intracranial enhancement. There are a few T2/FLAIR hyperintensities within the white matter, most likely related to chronic microvascular  ischemic disease that is mild for age. Mild atrophy with ex vacuo ventricular dilation. Vascular: See below. Skull and upper cervical spine: Normal marrow signal. Sinuses/Orbits: Mild pansinus mucosal thickening. Unremarkable orbits. Other: No mastoid effusions. MRA HEAD FINDINGS Anterior  circulation: No large vessel occlusion or proximal hemodynamically significant stenosis. No aneurysm. Posterior circulation: No large vessel occlusion or proximal hemodynamically significant stenosis. Bilateral posterior communicating arteries. No aneurysm. MRA NECK FINDINGS Aortic arch: Great vessel origins are patent. Right carotid system: No visible significant (greater than 50%) stenosis. Left carotid system: Mild narrowing at the carotid bifurcation. No visible significant (greater than 50%) stenosis. Vertebral arteries: Left dominant. Limited evaluation of the origins. No visible significant (greater than 50%) stenosis. IMPRESSION: 1. No evidence of acute intracranial abnormality. 2. No large vessel occlusion or visible hemodynamically significant stenosis. Electronically Signed   By: Feliberto Harts MD   On: 07/06/2020 17:47   MR ANGIO NECK W WO CONTRAST  Result Date: 07/06/2020 CLINICAL DATA:  Neuro deficit, acute stroke suspected. EXAM: MRI HEAD WITHOUT CONTRAST MRA HEAD WITHOUT CONTRAST MRA NECK WITHOUT AND WITH CONTRAST TECHNIQUE: Multiplanar, multi-echo pulse sequences of the brain and surrounding structures were acquired without intravenous contrast. Angiographic images of the Circle of Willis were acquired using MRA technique without intravenous contrast. Angiographic images of the neck were acquired using MRA technique without and with intravenous contrast. Carotid stenosis measurements (when applicable) are obtained utilizing NASCET criteria, using the distal internal carotid diameter as the denominator. CONTRAST:  10mL GADAVIST GADOBUTROL 1 MMOL/ML IV SOLN COMPARISON:  September 10, 2019. FINDINGS: MRI HEAD  FINDINGS Brain: No acute infarction, hemorrhage, hydrocephalus, extra-axial collection or mass lesion. No pathologic intracranial enhancement. There are a few T2/FLAIR hyperintensities within the white matter, most likely related to chronic microvascular ischemic disease that is mild for age. Mild atrophy with ex vacuo ventricular dilation. Vascular: See below. Skull and upper cervical spine: Normal marrow signal. Sinuses/Orbits: Mild pansinus mucosal thickening. Unremarkable orbits. Other: No mastoid effusions. MRA HEAD FINDINGS Anterior circulation: No large vessel occlusion or proximal hemodynamically significant stenosis. No aneurysm. Posterior circulation: No large vessel occlusion or proximal hemodynamically significant stenosis. Bilateral posterior communicating arteries. No aneurysm. MRA NECK FINDINGS Aortic arch: Great vessel origins are patent. Right carotid system: No visible significant (greater than 50%) stenosis. Left carotid system: Mild narrowing at the carotid bifurcation. No visible significant (greater than 50%) stenosis. Vertebral arteries: Left dominant. Limited evaluation of the origins. No visible significant (greater than 50%) stenosis. IMPRESSION: 1. No evidence of acute intracranial abnormality. 2. No large vessel occlusion or visible hemodynamically significant stenosis. Electronically Signed   By: Feliberto Harts MD   On: 07/06/2020 17:47   MR BRAIN WO CONTRAST  Result Date: 07/06/2020 CLINICAL DATA:  Neuro deficit, acute stroke suspected. EXAM: MRI HEAD WITHOUT CONTRAST MRA HEAD WITHOUT CONTRAST MRA NECK WITHOUT AND WITH CONTRAST TECHNIQUE: Multiplanar, multi-echo pulse sequences of the brain and surrounding structures were acquired without intravenous contrast. Angiographic images of the Circle of Willis were acquired using MRA technique without intravenous contrast. Angiographic images of the neck were acquired using MRA technique without and with intravenous contrast. Carotid  stenosis measurements (when applicable) are obtained utilizing NASCET criteria, using the distal internal carotid diameter as the denominator. CONTRAST:  10mL GADAVIST GADOBUTROL 1 MMOL/ML IV SOLN COMPARISON:  September 10, 2019. FINDINGS: MRI HEAD FINDINGS Brain: No acute infarction, hemorrhage, hydrocephalus, extra-axial collection or mass lesion. No pathologic intracranial enhancement. There are a few T2/FLAIR hyperintensities within the white matter, most likely related to chronic microvascular ischemic disease that is mild for age. Mild atrophy with ex vacuo ventricular dilation. Vascular: See below. Skull and upper cervical spine: Normal marrow signal. Sinuses/Orbits: Mild pansinus mucosal thickening. Unremarkable orbits. Other: No mastoid effusions.  MRA HEAD FINDINGS Anterior circulation: No large vessel occlusion or proximal hemodynamically significant stenosis. No aneurysm. Posterior circulation: No large vessel occlusion or proximal hemodynamically significant stenosis. Bilateral posterior communicating arteries. No aneurysm. MRA NECK FINDINGS Aortic arch: Great vessel origins are patent. Right carotid system: No visible significant (greater than 50%) stenosis. Left carotid system: Mild narrowing at the carotid bifurcation. No visible significant (greater than 50%) stenosis. Vertebral arteries: Left dominant. Limited evaluation of the origins. No visible significant (greater than 50%) stenosis. IMPRESSION: 1. No evidence of acute intracranial abnormality. 2. No large vessel occlusion or visible hemodynamically significant stenosis. Electronically Signed   By: Feliberto Harts MD   On: 07/06/2020 17:47   DG Chest Portable 1 View  Result Date: 07/06/2020 CLINICAL DATA:  Reason for exam: Fall Pt has hx of COPD, parkinson's, seizures, and strokes. Pt is a former smoker. fall EXAM: PORTABLE CHEST 1 VIEW COMPARISON:  None. FINDINGS: Normal mediastinum and cardiac silhouette. Normal pulmonary vasculature. No  evidence of effusion, infiltrate, or pneumothorax. No acute bony abnormality. IMPRESSION: No acute cardiopulmonary process. Electronically Signed   By: Genevive Bi M.D.   On: 07/06/2020 14:25   CT HEAD CODE STROKE WO CONTRAST  Result Date: 07/06/2020 CLINICAL DATA:  Code stroke.  Right-sided weakness EXAM: CT HEAD WITHOUT CONTRAST TECHNIQUE: Contiguous axial images were obtained from the base of the skull through the vertex without intravenous contrast. COMPARISON:  09/10/2019 FINDINGS: Brain: There is no acute intracranial hemorrhage, mass effect, or edema. No new loss of gray-white differentiation. Prominence of the ventricles and sulci reflects minor generalized parenchymal volume loss. Patchy hypoattenuation in the supratentorial white matter is nonspecific but may reflect mild chronic microvascular ischemic changes. No extra-axial collection. Vascular: No hyperdense vessel. There is intracranial atherosclerotic calcification at the skull base. Skull: Unremarkable. Sinuses/Orbits: No acute abnormality. Other: Mastoid air cells are clear. ASPECTS ([place] Stroke Program Early CT Score) - Ganglionic level infarction (caudate, lentiform nuclei, internal capsule, insula, M1-M3 cortex): 7 - Supraganglionic infarction (M4-M6 cortex): 3 Total score (0-10 with 10 being normal): 10 IMPRESSION: There is no acute intracranial hemorrhage or evidence of acute infarction. ASPECT score is 10. These results were communicated to Dr. Amada Jupiter at 1:32 pm on 07/06/2020 by text page via the [facility] messaging system. Electronically Signed   By: Guadlupe Spanish M.D.   On: 07/06/2020 13:34               LOS: 1 day   Jaeveon Ashland  [facility]   Pager on [email or web address]. If 7PM-7AM, please contact night-coverage at [email or web address]     07/07/2020, 2:34 PM

## 2020-07-07 NOTE — Plan of Care (Signed)
?  Problem: Cardiac: ?Goal: Ability to achieve and maintain adequate cardiopulmonary perfusion will improve ?Outcome: Progressing ?  ?Problem: Activity: ?Goal: Capacity to carry out activities will improve ?Outcome: Progressing ?  ?

## 2020-07-07 NOTE — Evaluation (Signed)
Occupational Therapy Evaluation Patient Details Name: Ian Gill MRN: [id number] DOB: 09-23-1945 Today's Date: 07/07/2020    History of Present Illness Pt is a 75 y/o M with PMH: HTN, HLD, COPD, stroke, GERD, depression, anxiety, Parkinson's, alcohol abuse, and R ankle ORIF in 2021. Pt presented d/t weakness and falls at his facility in setting of recent COVID diagnosis.   Clinical Impression   Pt seen for OT evaluation this date in setting of acute hospitalization d/t falls and weakness. Pt reports using AD for fxl mobility in his home facility of [place]. Pt's facility handles IADLs and it appears he has assistance with bathing as well. Pt presents this date with decreased fxl activity tolerance and general deconditioning impacting his ability to safely and efficiently perform ADLs/ADL mobility. On OT assessment this date, pt requires: SETUP to MIN A for seated UB ADLs, MIN to MOD A for seated LB ADLs, MIN A for ADL transfers with RW for UE support. Cues for safety. Pt transferred to chair with chair alarm, all needs met and in reach. Will continue to follow acutely. Anticipate pt will require STR in SNF setting to improve strength and safety for ADL mobility.     Follow Up Recommendations  SNF    Equipment Recommendations  Other (comment) (defer)    Recommendations for Other Services       Precautions / Restrictions Precautions Precautions: Fall Restrictions Weight Bearing Restrictions: No      Mobility Bed Mobility Overal bed mobility: Needs Assistance Bed Mobility: Supine to Sit     Supine to sit: Min guard;Min assist;HOB elevated          Transfers Overall transfer level: Needs assistance Equipment used: Rolling walker (2 wheeled) Transfers: Sit to/from [facility] Sit to Stand: Min assist Stand pivot transfers: Min assist            Balance Overall balance assessment: Needs assistance Sitting-balance support: Feet  supported Sitting balance-Leahy Scale: Good       Standing balance-Leahy Scale: Fair Standing balance comment: Pt requires UE support on RW.                           ADL either performed or assessed with clinical judgement   ADL                                         General ADL Comments: Pt requires SETUP to MIN A for seated UB ADLs, MIN to MOD A for seated LB ADLs, MIN A for ADL transfers with RW for UE support. Cues for safety.     Vision Patient Visual Report: No change from baseline       Perception     Praxis      Pertinent Vitals/Pain Pain Assessment: No/denies pain     Hand Dominance Left   Extremity/Trunk Assessment Upper Extremity Assessment Upper Extremity Assessment: Generalized weakness (decreased/delayed shld flexion against gravity, gets to 3/4 range. Grip MMT grossly 4-/5)   Lower Extremity Assessment Lower Extremity Assessment: Generalized weakness (Good ROM to don LB clothing to LLE, somewhat limtied on R d/t h/o fx, but ultimately pt is able to perform this task with increaesd time and MIN A.)       Communication Communication Communication: No difficulties;Other (comment) (clear speech, but hypophonic, diffiuclt to hear and has to be asked  to repeat himself several times. Pt with c/o sore throat and given beverage, but still somewhat difficult to hear to get accurate information.)   Cognition Arousal/Alertness: Awake/alert Behavior During Therapy: [facility] for tasks assessed/performed Overall Cognitive Status: Within Functional Limits for tasks assessed                                 General Comments: Pt is oriented x4, appropriate with command following, he is diffiuclt to hear and describes a sore throat, so difficult to gather all needed PLOF information at this time.   General Comments       Exercises Other Exercises Other Exercises: OT ed re: role of OT in acute setting, importance of OOB activity,  safe technique for use of RW, pt with good understanding. Other Exercises: OT engages pt in UB bathing seated EOB with MIN A and UB dressing with CGA/SETUP. Pt requires MOD A for LB dressing in sitting and standing for posterior in standing, in addition, MIN A to don socks in sitting. Pt able to perform STS and SPS with RW for UE support. Left in chair with chair alarm and all needs met/in reach.   Shoulder Instructions      Home Living Family/patient expects to be discharged to:: Skilled nursing facility ([facility])                                 Additional Comments: has varying levels of care, appears he was in ILF 10 months ago, chart indicates SNF in ED triage notes, but difficult to ascertain if this is accurate. Pt is appropriate but wif difficulty describing which level of care he is at within the facility.      Prior Functioning/Environment Level of Independence: Needs assistance  Gait / Transfers Assistance Needed: uses wheelchair or walker ADL's / Homemaking Assistance Needed: Assist from facility for most IADLs and seems to be some assistance with bathing/dressing although this is somewhat unclear as pt is hypophonic and describes a sore throat and provides limited information.            OT Problem List: Decreased strength;Decreased range of motion;Decreased activity tolerance;Impaired balance (sitting and/or standing);Decreased coordination      OT Treatment/Interventions: Self-care/ADL training;DME and/or AE instruction;Therapeutic activities;Balance training;Therapeutic exercise;Energy conservation;Patient/family education    OT Goals(Current goals can be found in the care plan section) Acute Rehab OT Goals Patient Stated Goal: to get stronger OT Goal Formulation: With patient Time For Goal Achievement: 07/21/20 Potential to Achieve Goals: Good ADL Goals Pt Will Perform Lower Body Bathing: with min guard assist;sit to/from stand Pt Will  Perform Upper Body Dressing: Independently;sitting Pt Will Perform Lower Body Dressing: with min guard assist;sit to/from stand Pt Will Transfer to Toilet: with min guard assist;ambulating;bedside commode (with LRAD, ~10-15' to improve tolerance for fxl distances) Pt Will Perform Toileting - Clothing Manipulation and hygiene: with min guard assist;sit to/from stand Pt/caregiver will Perform Home Exercise Program: Increased strength;Both right and left upper extremity;With Supervision  OT Frequency: Min 1X/week   Barriers to D/C:            Co-evaluation              AM-PAC OT "6 Clicks" Daily Activity     Outcome Measure Help from another person eating meals?: None Help from another person taking care of personal grooming?: A  Little Help from another person toileting, which includes using toliet, bedpan, or urinal?: A Lot Help from another person bathing (including washing, rinsing, drying)?: A Lot Help from another person to put on and taking off regular upper body clothing?: A Little Help from another person to put on and taking off regular lower body clothing?: A Lot 6 Click Score: 16   End of Session Equipment Utilized During Treatment: Gait belt;Rolling walker Nurse Communication: Mobility status  Activity Tolerance: Patient tolerated treatment well Patient left: in chair;with call bell/phone within reach;with chair alarm set  OT Visit Diagnosis: Unsteadiness on feet (R26.81);Muscle weakness (generalized) (M62.81)                Time: 2902-1115 OT Time Calculation (min): 35 min Charges:  OT General Charges $OT Visit: 1 Visit OT Evaluation $OT Eval Moderate Complexity: 1 Mod OT Treatments $Self Care/Home Management : 8-22 mins $Therapeutic Activity: 8-22 mins  Gerrianne Scale, MS, OTR/L ascom [phone] 07/07/20, 11:34 AM

## 2020-07-08 ENCOUNTER — Inpatient Hospital Stay: Payer: Medicare Other

## 2020-07-08 LAB — GLUCOSE, CAPILLARY
Glucose-Capillary: 113 mg/dL — ABNORMAL HIGH (ref 70–99)
Glucose-Capillary: 116 mg/dL — ABNORMAL HIGH (ref 70–99)
Glucose-Capillary: 125 mg/dL — ABNORMAL HIGH (ref 70–99)

## 2020-07-08 NOTE — Progress Notes (Signed)
Patient with unwitnessed fall.  Patient states that his phone fell to the floor and he reached for it and slid out of bed.  We assisted him back to bed and assessed for injuries.  Patient is alert and oriented times 4 and states that he did not hit his head and denied any pain.  He previously had a scab on his left knee and rubbed the scab off, knee bleeding.  Knee cleansed and foam dressing applied.  MD notified of fall and ordered head CT.   Son pending notification.  Will continue to monitor.

## 2020-07-08 NOTE — Progress Notes (Signed)
Patient son, Loraine Leriche, notified of fall.  All questions answered, no concerns voiced by son at this time.

## 2020-07-08 NOTE — Progress Notes (Signed)
When transport came to take patient to scheduled post fall CT Scan, pt refused to go. Stated to me that he doesn't need a CT scan. Pt A&Ox4.

## 2020-07-08 NOTE — Progress Notes (Addendum)
Progress Note    Ian Gill  JJO:[id number] DOB: 11-22-1945  DOA: 07/06/2020 PCP: Barbette Reichmann, MD      Brief Narrative:    Medical records reviewed and are as summarized below:  Ian Gill is a 75 y.o. male with medical history significant for COPD, seizure disorder, stroke, Parkinson's disease, hyperlipidemia, hypertension, alcohol use disorder, recently diagnosed with COVID-19 infection, who was brought to the hospital because of unwitnessed fall.  Reportedly, he was found on the floor at the group home. It is not clear whether he had a seizure prior to the fall.  He was admitted to the hospital for fall and suspected seizure.  Acute stroke was ruled out with MRI brain.    Assessment/Plan:   Active Problems:   Seizure (HCC)   COPD (chronic obstructive pulmonary disease) (HCC)   Stroke (HCC)   HLD (hyperlipidemia)   HTN (hypertension)   Physical deconditioning   Body mass index is 30.01 kg/m.   Seizure disorder: Continue antiepileptics.  No evidence of acute stroke on MRI brain.  Fever, COVID-19 infection: Reportedly tested positive on 07/04/2020 and had been started on Molnupiravir.  Continue Paxlovid.  He had fever on admission.  Monitor fever curve.  No antibiotics for now.  Hyponatremia: Improved and asymptomatic.  Mildly elevated troponins: This is likely due to demand ischemia.  No chest pain.  History of stroke and Parkinson's disease: He is on Plavix  Debility, falls: PT and OT.  PT and OT recommend discharge to SNF.  Follow-up with case manager to assist with disposition.  Other comorbidities include COPD, hypertension, hyperlipidemia, alcohol use disorder, depression     Diet Order             Diet Heart Room service appropriate? Yes; Fluid consistency: Thin  Diet effective now                      Consultants: Neurologist  Procedures: None    Medications:    amLODipine  10 mg Oral Daily   atorvastatin  20  mg Oral Daily   cholecalciferol  5,000 Units Oral Daily   clopidogrel  75 mg Oral Daily   divalproex  250 mg Oral Q12H   donepezil  5 mg Oral Daily   enoxaparin (LOVENOX) injection  0.5 mg/kg Subcutaneous Q24H   folic acid  1 mg Oral Daily   lamoTRIgine  150 mg Oral BID   levETIRAcetam  1,500 mg Oral BID   lisinopril  20 mg Oral Daily   melatonin  10 mg Oral QHS   multivitamin with minerals  1 tablet Oral Daily   nirmatrelvir/ritonavir EUA  3 tablet Oral BID   pneumococcal 23 valent vaccine  0.5 mL Intramuscular Tomorrow-1000   propranolol  10 mg Oral BID   sertraline  200 mg Oral Daily   thiamine  100 mg Oral Daily   Continuous Infusions:  methocarbamol (ROBAXIN) IV       Anti-infectives (From admission, onward)    Start     Dose/Rate Route Frequency Ordered Stop   07/07/20 1000  nirmatrelvir/ritonavir EUA (PAXLOVID) TABS 3 tablet        3 tablet Oral 2 times daily 07/07/20 0307 07/12/20 0959              Family Communication/Anticipated D/C date and plan/Code Status   DVT prophylaxis:      Code Status: DNR  Family Communication: None Disposition Plan:    Status  is: Inpatient  Remains inpatient appropriate because:Inpatient level of care appropriate due to severity of illness  Dispo: The patient is from: Group home              Anticipated d/c is to: SNF              Patient currently is not medically stable to d/c.   Difficult to place patient No           Subjective:   Interval events noted.  He has no complaints.  He said he does not want to go back to the facility where he came from.  He thinks he's paying too much money to be there.   Objective:    Vitals:   07/07/20 1605 07/07/20 2043 07/08/20 0540 07/08/20 0805  BP: 126/64 133/63 (!) 134/57 (!) 162/72  Pulse: 66 63 65 75  Resp:  17 17 20   Temp:  98.9 F (37.2 C) (!) 100.8 F (38.2 C) 99.3 F (37.4 C)  TempSrc:  Oral Oral   SpO2:  94% 100% 94%  Weight:      Height:        No data found.  No intake or output data in the 24 hours ending 07/08/20 1051  Filed Weights   07/06/20 1351 07/06/20 2303  Weight: 104 kg 97.6 kg    Exam:  GEN: NAD SKIN: Warm and dry EYES: No pallor or icterus ENT: MMM CV: RRR PULM: CTA B ABD: soft, ND, NT, +BS CNS: AAO x 3, non focal.  Resting tremor noted in her right hand. EXT: No edema or tenderness         Data Reviewed:   I have personally reviewed following labs and imaging studies:  Labs: Labs show the following:   Basic Metabolic Panel: Recent Labs  Lab 07/06/20 1310 07/07/20 0511  NA 129* 132*  K 3.9 3.7  CL 97* 98  CO2 22 24  GLUCOSE 121* 101*  BUN 22 18  CREATININE 1.07 0.80  CALCIUM 9.1 8.8*   GFR Estimated Creatinine Clearance: 93.6 mL/min (by C-G formula based on SCr of 0.8 mg/dL). Liver Function Tests: Recent Labs  Lab 07/06/20 1310 07/07/20 0511  AST 89* 113*  ALT 24 28  ALKPHOS 47 48  BILITOT 1.1 1.1  PROT 6.7 6.3*  ALBUMIN 3.9 3.7   No results for input(s): LIPASE, AMYLASE in the last 168 hours. No results for input(s): AMMONIA in the last 168 hours. Coagulation profile Recent Labs  Lab 07/06/20 1310  INR 1.1    CBC: Recent Labs  Lab 07/06/20 1310 07/07/20 0511  WBC 12.8* 8.5  NEUTROABS 10.0*  --   HGB 14.2 14.0  HCT 39.1 39.1  MCV 90.5 90.9  PLT 151 148*   Cardiac Enzymes: No results for input(s): CKTOTAL, CKMB, CKMBINDEX, TROPONINI in the last 168 hours. BNP (last 3 results) No results for input(s): PROBNP in the last 8760 hours. CBG: Recent Labs  Lab 07/08/20 0825  GLUCAP 113*   D-Dimer: Recent Labs    07/06/20 1310  DDIMER 1.59*   Hgb A1c: No results for input(s): HGBA1C in the last 72 hours. Lipid Profile: No results for input(s): CHOL, HDL, LDLCALC, TRIG, CHOLHDL, LDLDIRECT in the last 72 hours. Thyroid function studies: No results for input(s): TSH, T4TOTAL, T3FREE, THYROIDAB in the last 72 hours.  Invalid input(s): FREET3 Anemia  work up: No results for input(s): VITAMINB12, FOLATE, FERRITIN, TIBC, IRON, RETICCTPCT in the last 72 hours. Sepsis Labs: Recent  Labs  Lab 07/06/20 1310 07/07/20 0511  WBC 12.8* 8.5    Microbiology Recent Results (from the past 240 hour(s))  Resp Panel by RT-PCR (Flu A&B, Covid) Nasopharyngeal Swab     Status: Abnormal   Collection Time: 07/06/20  6:07 PM   Specimen: Nasopharyngeal Swab; Nasopharyngeal(NP) swabs in vial transport medium  Result Value Ref Range Status   SARS Coronavirus 2 by RT PCR POSITIVE (A) NEGATIVE Final    Comment: RESULT CALLED TO, READ BACK BY AND VERIFIED WITH: DEE GIVENS 1909 07/06/2020 [facility] (NOTE) SARS-CoV-2 target nucleic acids are DETECTED.  The SARS-CoV-2 RNA is generally detectable in upper respiratory specimens during the acute phase of infection. Positive results are indicative of the presence of the identified virus, but do not rule out bacterial infection or co-infection with other pathogens not detected by the test. Clinical correlation with patient history and other diagnostic information is necessary to determine patient infection status. The expected result is Negative.  Fact Sheet for Patients: BloggerCourse.com  Fact Sheet for Healthcare Providers: SeriousBroker.it  This test is not yet approved or cleared by the [place] FDA and  has been authorized for detection and/or diagnosis of SARS-CoV-2 by FDA under an Emergency Use Authorization (EUA).  This EUA will remain in effect (meaning this test can be [place] ed) for the duration of  the COVID-19 declaration under Section 564(b)(1) of the Act, 21 U.S.C. section 360bbb-3(b)(1), unless the authorization is terminated or revoked sooner.     Influenza A by PCR NEGATIVE NEGATIVE Final   Influenza B by PCR NEGATIVE NEGATIVE Final    Comment: (NOTE) The Xpert Xpress SARS-CoV-2/FLU/RSV plus assay is intended as an aid in the diagnosis of  influenza from Nasopharyngeal swab specimens and should not be used as a sole basis for treatment. Nasal washings and aspirates are unacceptable for Xpert Xpress SARS-CoV-2/FLU/RSV testing.  Fact Sheet for Patients: BloggerCourse.com  Fact Sheet for Healthcare Providers: SeriousBroker.it  This test is not yet approved or cleared by the [place] FDA and has been authorized for detection and/or diagnosis of SARS-CoV-2 by FDA under an Emergency Use Authorization (EUA). This EUA will remain in effect (meaning this test can be used) for the duration of the COVID-19 declaration under Section 564(b)(1) of the Act, 21 U.S.C. section 360bbb-3(b)(1), unless the authorization is terminated or revoked.  Performed at [facility], [address] [place], [place] [address]     Procedures and diagnostic studies:  MR ANGIO HEAD WO CONTRAST  Result Date: 07/06/2020 CLINICAL DATA:  Neuro deficit, acute stroke suspected. EXAM: MRI HEAD WITHOUT CONTRAST MRA HEAD WITHOUT CONTRAST MRA NECK WITHOUT AND WITH CONTRAST TECHNIQUE: Multiplanar, multi-echo pulse sequences of the brain and surrounding structures were acquired without intravenous contrast. Angiographic images of the Circle of Willis were acquired using MRA technique without intravenous contrast. Angiographic images of the neck were acquired using MRA technique without and with intravenous contrast. Carotid stenosis measurements (when applicable) are obtained utilizing NASCET criteria, using the distal internal carotid diameter as the denominator. CONTRAST:  10mL GADAVIST GADOBUTROL 1 MMOL/ML IV SOLN COMPARISON:  September 10, 2019. FINDINGS: MRI HEAD FINDINGS Brain: No acute infarction, hemorrhage, hydrocephalus, extra-axial collection or mass lesion. No pathologic intracranial enhancement. There are a few T2/FLAIR hyperintensities within the white matter, most likely related to chronic  microvascular ischemic disease that is mild for age. Mild atrophy with ex vacuo ventricular dilation. Vascular: See below. Skull and upper cervical spine: Normal marrow signal. Sinuses/Orbits: Mild pansinus mucosal thickening.  Unremarkable orbits. Other: No mastoid effusions. MRA HEAD FINDINGS Anterior circulation: No large vessel occlusion or proximal hemodynamically significant stenosis. No aneurysm. Posterior circulation: No large vessel occlusion or proximal hemodynamically significant stenosis. Bilateral posterior communicating arteries. No aneurysm. MRA NECK FINDINGS Aortic arch: Great vessel origins are patent. Right carotid system: No visible significant (greater than 50%) stenosis. Left carotid system: Mild narrowing at the carotid bifurcation. No visible significant (greater than 50%) stenosis. Vertebral arteries: Left dominant. Limited evaluation of the origins. No visible significant (greater than 50%) stenosis. IMPRESSION: 1. No evidence of acute intracranial abnormality. 2. No large vessel occlusion or visible hemodynamically significant stenosis. Electronically Signed   By: Feliberto Harts MD   On: 07/06/2020 17:47   MR ANGIO NECK W WO CONTRAST  Result Date: 07/06/2020 CLINICAL DATA:  Neuro deficit, acute stroke suspected. EXAM: MRI HEAD WITHOUT CONTRAST MRA HEAD WITHOUT CONTRAST MRA NECK WITHOUT AND WITH CONTRAST TECHNIQUE: Multiplanar, multi-echo pulse sequences of the brain and surrounding structures were acquired without intravenous contrast. Angiographic images of the Circle of Willis were acquired using MRA technique without intravenous contrast. Angiographic images of the neck were acquired using MRA technique without and with intravenous contrast. Carotid stenosis measurements (when applicable) are obtained utilizing NASCET criteria, using the distal internal carotid diameter as the denominator. CONTRAST:  60mL GADAVIST GADOBUTROL 1 MMOL/ML IV SOLN COMPARISON:  September 10, 2019. FINDINGS:  MRI HEAD FINDINGS Brain: No acute infarction, hemorrhage, hydrocephalus, extra-axial collection or mass lesion. No pathologic intracranial enhancement. There are a few T2/FLAIR hyperintensities within the white matter, most likely related to chronic microvascular ischemic disease that is mild for age. Mild atrophy with ex vacuo ventricular dilation. Vascular: See below. Skull and upper cervical spine: Normal marrow signal. Sinuses/Orbits: Mild pansinus mucosal thickening. Unremarkable orbits. Other: No mastoid effusions. MRA HEAD FINDINGS Anterior circulation: No large vessel occlusion or proximal hemodynamically significant stenosis. No aneurysm. Posterior circulation: No large vessel occlusion or proximal hemodynamically significant stenosis. Bilateral posterior communicating arteries. No aneurysm. MRA NECK FINDINGS Aortic arch: Great vessel origins are patent. Right carotid system: No visible significant (greater than 50%) stenosis. Left carotid system: Mild narrowing at the carotid bifurcation. No visible significant (greater than 50%) stenosis. Vertebral arteries: Left dominant. Limited evaluation of the origins. No visible significant (greater than 50%) stenosis. IMPRESSION: 1. No evidence of acute intracranial abnormality. 2. No large vessel occlusion or visible hemodynamically significant stenosis. Electronically Signed   By: Feliberto Harts MD   On: 07/06/2020 17:47   MR BRAIN WO CONTRAST  Result Date: 07/06/2020 CLINICAL DATA:  Neuro deficit, acute stroke suspected. EXAM: MRI HEAD WITHOUT CONTRAST MRA HEAD WITHOUT CONTRAST MRA NECK WITHOUT AND WITH CONTRAST TECHNIQUE: Multiplanar, multi-echo pulse sequences of the brain and surrounding structures were acquired without intravenous contrast. Angiographic images of the Circle of Willis were acquired using MRA technique without intravenous contrast. Angiographic images of the neck were acquired using MRA technique without and with intravenous contrast.  Carotid stenosis measurements (when applicable) are obtained utilizing NASCET criteria, using the distal internal carotid diameter as the denominator. CONTRAST:  28mL GADAVIST GADOBUTROL 1 MMOL/ML IV SOLN COMPARISON:  September 10, 2019. FINDINGS: MRI HEAD FINDINGS Brain: No acute infarction, hemorrhage, hydrocephalus, extra-axial collection or mass lesion. No pathologic intracranial enhancement. There are a few T2/FLAIR hyperintensities within the white matter, most likely related to chronic microvascular ischemic disease that is mild for age. Mild atrophy with ex vacuo ventricular dilation. Vascular: See below. Skull and upper cervical spine: Normal marrow signal. Sinuses/Orbits:  Mild pansinus mucosal thickening. Unremarkable orbits. Other: No mastoid effusions. MRA HEAD FINDINGS Anterior circulation: No large vessel occlusion or proximal hemodynamically significant stenosis. No aneurysm. Posterior circulation: No large vessel occlusion or proximal hemodynamically significant stenosis. Bilateral posterior communicating arteries. No aneurysm. MRA NECK FINDINGS Aortic arch: Great vessel origins are patent. Right carotid system: No visible significant (greater than 50%) stenosis. Left carotid system: Mild narrowing at the carotid bifurcation. No visible significant (greater than 50%) stenosis. Vertebral arteries: Left dominant. Limited evaluation of the origins. No visible significant (greater than 50%) stenosis. IMPRESSION: 1. No evidence of acute intracranial abnormality. 2. No large vessel occlusion or visible hemodynamically significant stenosis. Electronically Signed   By: Feliberto Harts MD   On: 07/06/2020 17:47   DG Chest Portable 1 View  Result Date: 07/06/2020 CLINICAL DATA:  Reason for exam: Fall Pt has hx of COPD, parkinson's, seizures, and strokes. Pt is a former smoker. fall EXAM: PORTABLE CHEST 1 VIEW COMPARISON:  None. FINDINGS: Normal mediastinum and cardiac silhouette. Normal pulmonary  vasculature. No evidence of effusion, infiltrate, or pneumothorax. No acute bony abnormality. IMPRESSION: No acute cardiopulmonary process. Electronically Signed   By: Genevive Bi M.D.   On: 07/06/2020 14:25   CT HEAD CODE STROKE WO CONTRAST  Result Date: 07/06/2020 CLINICAL DATA:  Code stroke.  Right-sided weakness EXAM: CT HEAD WITHOUT CONTRAST TECHNIQUE: Contiguous axial images were obtained from the base of the skull through the vertex without intravenous contrast. COMPARISON:  09/10/2019 FINDINGS: Brain: There is no acute intracranial hemorrhage, mass effect, or edema. No new loss of gray-white differentiation. Prominence of the ventricles and sulci reflects minor generalized parenchymal volume loss. Patchy hypoattenuation in the supratentorial white matter is nonspecific but may reflect mild chronic microvascular ischemic changes. No extra-axial collection. Vascular: No hyperdense vessel. There is intracranial atherosclerotic calcification at the skull base. Skull: Unremarkable. Sinuses/Orbits: No acute abnormality. Other: Mastoid air cells are clear. ASPECTS ([place]ber[place] Stroke Program Early CT Score) - Ganglionic level infarction (caudate, lentiform nuclei, internal capsule, insula, M1-M3 cortex): 7 - Supraganglionic infarction (M4-M6 cortex): 3 Total score (0-10 with 10 being normal): 10 IMPRESSION: There is no acute intracranial hemorrhage or evidence of acute infarction. ASPECT score is 10. These results were communicated to Dr. Amada Jupiter at 1:32 pm on 07/06/2020 by text page via the [facility] messaging system. Electronically Signed   By: Guadlupe Spanish M.D.   On: 07/06/2020 13:34               LOS: 2 days   Taysean Wager  [facility]   Pager on [email or web address]. If 7PM-7AM, please contact night-coverage at [email or web address]     07/08/2020, 10:51 AM

## 2020-07-08 NOTE — Progress Notes (Signed)
PT recommended SNF. Attempted call into patient room due to isolation status. Also tried patients mobile #. Unable to reach patient. TOC to try again later.  Patient is COVID + as of 6/10 so would have to wait until 10 days post COVID to go to local SNFs.  Alfonso Ramus, [place] [phone]

## 2020-07-09 LAB — GLUCOSE, CAPILLARY
Glucose-Capillary: 109 mg/dL — ABNORMAL HIGH (ref 70–99)
Glucose-Capillary: 86 mg/dL (ref 70–99)

## 2020-07-09 LAB — LAMOTRIGINE LEVEL: Lamotrigine Lvl: 10.8 ug/mL (ref 2.0–20.0)

## 2020-07-09 NOTE — TOC Initial Note (Signed)
Transition of Care [facility]) - Initial/Assessment Note    Patient Details  Name: Ian Gill MRN: [id number] Date of Birth: [birth year]  [facility] of [facility]) CM/SW Contact:    Allayne Butcher, RN Phone Number: 07/09/2020, 4:35 PM  Clinical Narrative:                 Patient admitted to the hospital for COVID 19.  Patient is from [facility] long term skilled nursing care unit.  RNCM was able to speak with Cala Bradford over at [facility] today, they are aware the patient has COVID and can take him back any time.  Insurance auth started,should be back by tomorrow.  MD reports patient medically cleared for DC.  Plan for DC tomorrow back to [facility] tomorrow.   Expected Discharge Plan: Skilled Nursing Facility Barriers to Discharge: Barriers Resolved   Patient Goals and CMS Choice   CMS Medicare.gov Compare Post Acute Care list provided to:: Patient Choice offered to / list presented to : Patient  Expected Discharge Plan and Services Expected Discharge Plan: Skilled Nursing Facility   Discharge Planning Services: CM Consult Post Acute Care Choice: Skilled Nursing Facility Living arrangements for the past 2 months: Skilled Nursing Facility                 DME Arranged: N/A DME Agency: NA       HH Arranged: NA HH Agency: NA        Prior Living Arrangements/Services Living arrangements for the past 2 months: Skilled Nursing Facility Lives with:: Facility Resident Patient language and need for interpreter reviewed:: Yes Do you feel safe going back to the place where you live?: Yes      Need for Family Participation in Patient Care: Yes (Comment) Care giver support system in place?: Yes (comment) (son and skilled nursing facility)   Criminal Activity/Legal Involvement Pertinent to Current Situation/Hospitalization: No - Comment as needed  Activities of Daily Living Home Assistive Devices/Equipment: Environmental consultant (specify type), Wheelchair ADL Screening (condition at time  of admission) Patient's cognitive ability adequate to safely complete daily activities?: No Is the patient deaf or have difficulty hearing?: No Does the patient have difficulty seeing, even when wearing glasses/contacts?: No Does the patient have difficulty concentrating, remembering, or making decisions?: Yes Patient able to express need for assistance with ADLs?: Yes Does the patient have difficulty dressing or bathing?: Yes Independently performs ADLs?: Yes (appropriate for developmental age) Does the patient have difficulty walking or climbing stairs?: No Weakness of Legs: Both Weakness of Arms/Hands: Both  Permission Sought/Granted Permission sought to share information with : Case Manager, Family Supports, Oceanographer granted to share information with : Yes, Verbal Permission Granted  Share Information with NAME: Loraine Leriche  Permission granted to share info w AGENCY: [employer] granted to share info w Relationship: son     Emotional Assessment       Orientation: : Oriented to Self, Oriented to Place, Oriented to  Time, Oriented to Situation Alcohol / Substance Use: Not Applicable Psych Involvement: No (comment)  Admission diagnosis:  Weakness [R53.1] Physical deconditioning [R53.81] Acute respiratory failure with hypoxia (HCC) [J96.01] COVID-19 [U07.1] Patient Active Problem List   Diagnosis Date Noted   Physical deconditioning 07/06/2020   COVID-19    Weakness    Hypertensive urgency 09/08/2019   Fall 09/08/2019   COPD (chronic obstructive pulmonary disease) (HCC)    Depression    Stroke (HCC)    HLD (hyperlipidemia)  GERD (gastroesophageal reflux disease)    HTN (hypertension)    Acute metabolic encephalopathy    Postictal state (HCC) 05/31/2019   Seizure (HCC) 05/30/2019   Alcohol use 05/30/2019   Post-ictal state (HCC) 05/30/2019   PCP:  Barbette Reichmann, MD Pharmacy:   [facility] [phone] [place] [address], [place]  - [address] [place] [address] Phone: [phone] Fax: [phone]     Social Determinants of Health (SDOH) Interventions    Readmission Risk Interventions No flowsheet data found.

## 2020-07-09 NOTE — Progress Notes (Signed)
Progress Note    Ian Gill  DQQ:[id number] DOB: 29-Feb-1945  DOA: 07/06/2020 PCP: Barbette Reichmann, MD      Brief Narrative:    Medical records reviewed and are as summarized below:  Ian Gill is a 75 y.o. male with medical history significant for COPD, seizure disorder, stroke, Parkinson's disease, hyperlipidemia, hypertension, alcohol use disorder, recently diagnosed with COVID-19 infection, who was brought to the hospital because of unwitnessed fall.  Reportedly, he was found on the floor at the group home. It is not clear whether he had a seizure prior to the fall.  He was admitted to the hospital for fall and suspected seizure.  Acute stroke was ruled out with MRI brain.    Assessment/Plan:   Active Problems:   Seizure (HCC)   COPD (chronic obstructive pulmonary disease) (HCC)   Stroke (HCC)   HLD (hyperlipidemia)   HTN (hypertension)   Physical deconditioning   Body mass index is 30.01 kg/m.   Seizure disorder: Continue antiepileptics.  No evidence of acute stroke on MRI brain.  Fever, COVID-19 infection: Reportedly tested positive on 07/04/2020 and had been started on Molnupiravir.  Continue Paxlovid.  He had fever on admission.  No fever for >436 hours.  Hyponatremia: Improved and asymptomatic.  Mildly elevated troponins: This is likely due to demand ischemia.  No chest pain.  History of stroke and Parkinson's disease: He is on Plavix  Debility, falls: PT and OT.  PT and OT recommend discharge to SNF.  Follow-up with case manager to assist with disposition.  Other comorbidities include COPD, hypertension, hyperlipidemia, alcohol use disorder, depression     Diet Order             Diet Heart Room service appropriate? Yes; Fluid consistency: Thin  Diet effective now                      Consultants: Neurologist  Procedures: None    Medications:    amLODipine  10 mg Oral Daily   atorvastatin  20 mg Oral Daily    cholecalciferol  5,000 Units Oral Daily   clopidogrel  75 mg Oral Daily   divalproex  250 mg Oral Q12H   donepezil  5 mg Oral Daily   enoxaparin (LOVENOX) injection  0.5 mg/kg Subcutaneous Q24H   folic acid  1 mg Oral Daily   lamoTRIgine  150 mg Oral BID   levETIRAcetam  1,500 mg Oral BID   lisinopril  20 mg Oral Daily   melatonin  10 mg Oral QHS   multivitamin with minerals  1 tablet Oral Daily   nirmatrelvir/ritonavir EUA  3 tablet Oral BID   pneumococcal 23 valent vaccine  0.5 mL Intramuscular Tomorrow-1000   propranolol  10 mg Oral BID   sertraline  200 mg Oral Daily   thiamine  100 mg Oral Daily   Continuous Infusions:  methocarbamol (ROBAXIN) IV       Anti-infectives (From admission, onward)    Start     Dose/Rate Route Frequency Ordered Stop   07/07/20 1000  nirmatrelvir/ritonavir EUA (PAXLOVID) TABS 3 tablet        3 tablet Oral 2 times daily 07/07/20 0307 07/12/20 0959              Family Communication/Anticipated D/C date and plan/Code Status   DVT prophylaxis:      Code Status: DNR  Family Communication: None Disposition Plan:    Status is: Inpatient  Remains inpatient appropriate because:Inpatient level of care appropriate due to severity of illness  Dispo: The patient is from: Group home              Anticipated d/c is to: SNF              Patient currently is not medically stable to d/c.   Difficult to place patient No           Subjective:   Interval events noted.  Patient said he slid out of the bed last night and he does not think it was a heavy fall.  He feels okay.  No headache, dizziness or confusion.  Objective:    Vitals:   07/09/20 0418 07/09/20 0450 07/09/20 0805 07/09/20 1206  BP: (!) 157/64 129/68 (!) 172/94 128/67  Pulse: 70 (!) 55 65 61  Resp: 19 15 18 18   Temp: 97.9 F (36.6 C) 98.4 F (36.9 C) 97.9 F (36.6 C) 98.1 F (36.7 C)  TempSrc: Oral  Oral Oral  SpO2: 95% 93% 99% 95%  Weight:      Height:        No data found.   Intake/Output Summary (Last 24 hours) at 07/09/2020 1419 Last data filed at 07/09/2020 0448 Gross per 24 hour  Intake --  Output 700 ml  Net -700 ml    Filed Weights   07/06/20 1351 07/06/20 2303  Weight: 104 kg 97.6 kg    Exam:  GEN: NAD SKIN: Abrasion on left knee.  Old scabbed wound on right knee. EYES: No pallor or icterus ENT: MMM CV: RRR PULM: CTA B ABD: soft, ND, NT, +BS CNS: AAO x 3, non focal EXT: No edema or tenderness         Data Reviewed:   I have personally reviewed following labs and imaging studies:  Labs: Labs show the following:   Basic Metabolic Panel: Recent Labs  Lab 07/06/20 1310 07/07/20 0511  NA 129* 132*  K 3.9 3.7  CL 97* 98  CO2 22 24  GLUCOSE 121* 101*  BUN 22 18  CREATININE 1.07 0.80  CALCIUM 9.1 8.8*   GFR Estimated Creatinine Clearance: 93.6 mL/min (by C-G formula based on SCr of 0.8 mg/dL). Liver Function Tests: Recent Labs  Lab 07/06/20 1310 07/07/20 0511  AST 89* 113*  ALT 24 28  ALKPHOS 47 48  BILITOT 1.1 1.1  PROT 6.7 6.3*  ALBUMIN 3.9 3.7   No results for input(s): LIPASE, AMYLASE in the last 168 hours. No results for input(s): AMMONIA in the last 168 hours. Coagulation profile Recent Labs  Lab 07/06/20 1310  INR 1.1    CBC: Recent Labs  Lab 07/06/20 1310 07/07/20 0511  WBC 12.8* 8.5  NEUTROABS 10.0*  --   HGB 14.2 14.0  HCT 39.1 39.1  MCV 90.5 90.9  PLT 151 148*   Cardiac Enzymes: No results for input(s): CKTOTAL, CKMB, CKMBINDEX, TROPONINI in the last 168 hours. BNP (last 3 results) No results for input(s): PROBNP in the last 8760 hours. CBG: Recent Labs  Lab 07/06/20 1307 07/08/20 0825 07/08/20 1153 07/08/20 1605 07/09/20 0802  GLUCAP 109* 113* 116* 125* 86   D-Dimer: No results for input(s): DDIMER in the last 72 hours.  Hgb A1c: No results for input(s): HGBA1C in the last 72 hours. Lipid Profile: No results for input(s): CHOL, HDL, LDLCALC, TRIG,  CHOLHDL, LDLDIRECT in the last 72 hours. Thyroid function studies: No results for input(s): TSH, T4TOTAL, T3FREE, THYROIDAB in the  last 72 hours.  Invalid input(s): FREET3 Anemia work up: No results for input(s): VITAMINB12, FOLATE, FERRITIN, TIBC, IRON, RETICCTPCT in the last 72 hours. Sepsis Labs: Recent Labs  Lab 07/06/20 1310 07/07/20 0511  WBC 12.8* 8.5    Microbiology Recent Results (from the past 240 hour(s))  Resp Panel by RT-PCR (Flu A&B, Covid) Nasopharyngeal Swab     Status: Abnormal   Collection Time: 07/06/20  6:07 PM   Specimen: Nasopharyngeal Swab; Nasopharyngeal(NP) swabs in vial transport medium  Result Value Ref Range Status   SARS Coronavirus 2 by RT PCR POSITIVE (A) NEGATIVE Final    Comment: RESULT CALLED TO, READ BACK BY AND VERIFIED WITH: DEE GIVENS 1909 07/06/2020 L[facility] (NOTE) SARS-CoV-2 target nucleic acids are DETECTED.  The SARS-CoV-2 RNA is generally detectable in upper respiratory specimens during the acute phase of infection. Positive results are indicative of the presence of the identified virus, but do not rule out bacterial infection or co-infection with other pathogens not detected by the test. Clinical correlation with patient history and other diagnostic information is necessary to determine patient infection status. The expected result is Negative.  Fact Sheet for Patients: BloggerCourse.com  Fact Sheet for Healthcare Providers: SeriousBroker.it  This test is not yet approved or cleared by the [place] FDA and  has been authorized for detection and/or diagnosis of SARS-CoV-2 by FDA under an Emergency Use Authorization (EUA).  This EUA will remain in effect (meaning this test can be [place] ed) for the duration of  the COVID-19 declaration under Section 564(b)(1) of the Act, 21 U.S.C. section 360bbb-3(b)(1), unless the authorization is terminated or revoked sooner.     Influenza A by  PCR NEGATIVE NEGATIVE Final   Influenza B by PCR NEGATIVE NEGATIVE Final    Comment: (NOTE) The Xpert Xpress SARS-CoV-2/FLU/RSV plus assay is intended as an aid in the diagnosis of influenza from Nasopharyngeal swab specimens and should not be used as a sole basis for treatment. Nasal washings and aspirates are unacceptable for Xpert Xpress SARS-CoV-2/FLU/RSV testing.  Fact Sheet for Patients: BloggerCourse.com  Fact Sheet for Healthcare Providers: SeriousBroker.it  This test is not yet approved or cleared by the [place] FDA and has been authorized for detection and/or diagnosis of SARS-CoV-2 by FDA under an Emergency Use Authorization (EUA). This EUA will remain in effect (meaning this test can be used) for the duration of the COVID-19 declaration under Section 564(b)(1) of the Act, 21 U.S.C. section 360bbb-3(b)(1), unless the authorization is terminated or revoked.  Performed at [facility], [address] [place], [place] [address]     Procedures and diagnostic studies:  No results found.             LOS: 3 days   Chantil Bari  [facility]   Pager on [email or web address]. If 7PM-7AM, please contact night-coverage at [email or web address]     07/09/2020, 2:19 PM

## 2020-07-09 NOTE — [place] FL2 (Signed)
[place] MEDICAID FL2 LEVEL OF CARE SCREENING TOOL     IDENTIFICATION  Patient Name: Ian Gill Birthdate: [birth year] Sex: male Admission Date (Current Location): 07/06/2020  [facility] and [place] Number:  Chiropodist and Address:  [facility], [address], [place], [place] [address]      Provider Number: [id number]  Attending Physician Name and Address:  Lurene Shadow, MD  Relative Name and Phone Number:  Randell Teare (son) [phone]    Current Level of Care: Hospital Recommended Level of Care: Skilled Nursing Facility Prior Approval Number:    Date Approved/Denied:   PASRR Number: [id number]  Discharge Plan: SNF    Current Diagnoses: Patient Active Problem List   Diagnosis Date Noted   Physical deconditioning 07/06/2020   COVID-19    Weakness    Hypertensive urgency 09/08/2019   Fall 09/08/2019   COPD (chronic obstructive pulmonary disease) (HCC)    Depression    Stroke (HCC)    HLD (hyperlipidemia)    GERD (gastroesophageal reflux disease)    HTN (hypertension)    Acute metabolic encephalopathy    Postictal state (HCC) 05/31/2019   Seizure (HCC) 05/30/2019   Alcohol use 05/30/2019   Post-ictal state (HCC) 05/30/2019    Orientation RESPIRATION BLADDER Height & Weight     Self, Time, Situation, Place  Normal Continent, External catheter Weight: 97.6 kg Height:  5\' 11"  (180.3 cm)  BEHAVIORAL SYMPTOMS/MOOD NEUROLOGICAL BOWEL NUTRITION STATUS      Continent Diet (heart healthy)  AMBULATORY STATUS COMMUNICATION OF NEEDS Skin   Limited Assist Verbally Skin abrasions (left knee- foam dressing)                       Personal Care Assistance Level of Assistance  Bathing, Feeding, Dressing Bathing Assistance: Limited assistance Feeding assistance: Limited assistance Dressing Assistance: Limited assistance     Functional Limitations Info  Sight, Hearing, Speech Sight Info: Impaired Hearing Info:  Adequate Speech Info: Adequate    SPECIAL CARE FACTORS FREQUENCY  PT (By licensed PT), OT (By licensed OT)     PT Frequency: 5 times per week OT Frequency: 5 times per week            Contractures Contractures Info: Not present    Additional Factors Info  Code Status, Allergies Code Status Info: DNR Allergies Info: Buproprion, dilantin, iodinated contrast dye           Current Medications (07/09/2020):  This is the current hospital active medication list Current Facility-Administered Medications  Medication Dose Route Frequency Provider Last Rate Last Admin   acetaminophen (TYLENOL) tablet 650 mg  650 mg Oral Q6H PRN 07/11/2020, MD   650 mg at 07/07/20 0510   Or   acetaminophen (TYLENOL) suppository 650 mg  650 mg Rectal Q6H PRN 09/06/20, MD       amLODipine (NORVASC) tablet 10 mg  10 mg Oral Daily Venora Maples, MD   10 mg at 07/09/20 1029   atorvastatin (LIPITOR) tablet 20 mg  20 mg Oral Daily 07/11/20, MD   20 mg at 07/09/20 1029   cholecalciferol (VITAMIN D3) tablet 5,000 Units  5,000 Units Oral Daily 07/11/20, MD   5,000 Units at 07/09/20 1030   clopidogrel (PLAVIX) tablet 75 mg  75 mg Oral Daily 07/11/20, MD   75 mg at 07/09/20 1029   divalproex (DEPAKOTE SPRINKLE) capsule 250 mg  250 mg Oral  Q12H Venora Maples, MD   250 mg at 07/09/20 1031   donepezil (ARICEPT) tablet 5 mg  5 mg Oral Daily Venora Maples, MD   5 mg at 07/09/20 1029   enoxaparin (LOVENOX) injection 50 mg  0.5 mg/kg Subcutaneous Q24H Venora Maples, MD   50 mg at 07/09/20 1032   folic acid (FOLVITE) tablet 1 mg  1 mg Oral Daily Venora Maples, MD   1 mg at 07/09/20 1029   lamoTRIgine (LAMICTAL) tablet 150 mg  150 mg Oral BID Venora Maples, MD   150 mg at 07/09/20 1028   levETIRAcetam (KEPPRA) tablet 1,500 mg  1,500 mg Oral BID Venora Maples, MD   1,500 mg at 07/09/20 1031   lisinopril (ZESTRIL) tablet 20 mg  20 mg Oral Daily  Venora Maples, MD   20 mg at 07/09/20 1028   melatonin tablet 10 mg  10 mg Oral QHS Venora Maples, MD   10 mg at 07/08/20 2155   methocarbamol (ROBAXIN) 500 mg in dextrose 5 % 50 mL IVPB  500 mg Intravenous Q6H PRN Venora Maples, MD       multivitamin with minerals tablet 1 tablet  1 tablet Oral Daily Venora Maples, MD   1 tablet at 07/09/20 1031   nirmatrelvir/ritonavir EUA (PAXLOVID) TABS 3 tablet  3 tablet Oral BID Venora Maples, MD   3 tablet at 07/09/20 1031   ondansetron (ZOFRAN) tablet 4 mg  4 mg Oral Q6H PRN Venora Maples, MD       Or   ondansetron [facility]) injection 4 mg  4 mg Intravenous Q6H PRN Venora Maples, MD       oxyCODONE (Oxy IR/ROXICODONE) immediate release tablet 5 mg  5 mg Oral Q4H PRN Venora Maples, MD       pneumococcal 23 valent vaccine (PNEUMOVAX-23) injection 0.5 mL  0.5 mL Intramuscular Tomorrow-1000 Lurene Shadow, MD       polyethylene glycol (MIRALAX / GLYCOLAX) packet 17 g  17 g Oral Daily PRN Venora Maples, MD       propranolol (INDERAL) tablet 10 mg  10 mg Oral BID Venora Maples, MD   10 mg at 07/09/20 1030   sertraline (ZOLOFT) tablet 200 mg  200 mg Oral Daily Venora Maples, MD   200 mg at 07/09/20 1029   thiamine tablet 100 mg  100 mg Oral Daily Venora Maples, MD   100 mg at 07/09/20 1028   traZODone (DESYREL) tablet 50 mg  50 mg Oral QHS PRN Venora Maples, MD         Discharge Medications: Please see discharge summary for a list of discharge medications.  Relevant Imaging Results:  Relevant Lab Results:   Additional Information SS# [id number]  Allayne Butcher, RN

## 2020-07-10 ENCOUNTER — Encounter
Admission: RE | Admit: 2020-07-10 | Discharge: 2020-07-10 | Disposition: A | Discharge status: Home / Self Care | Payer: Medicare Other | Source: Ambulatory Visit | Attending: Internal Medicine | Admitting: Internal Medicine

## 2020-07-10 DIAGNOSIS — I10 Essential (primary) hypertension: Secondary | ICD-10-CM

## 2020-07-10 DIAGNOSIS — U071 COVID-19: Principal | ICD-10-CM

## 2020-07-10 LAB — LEVETIRACETAM LEVEL: Levetiracetam Lvl: 36.7 ug/mL (ref 10.0–40.0)

## 2020-07-10 MED ORDER — ATORVASTATIN CALCIUM 20 MG PO TABS: 20.0000 mg | ORAL_TABLET | Freq: Every day | ORAL | 0 refills | Status: AC

## 2020-07-10 NOTE — Progress Notes (Signed)
I spoke to Dr. Mikael Spray medical director for insurance company for a peer to peer review for discharge to SNF.  She said she cannot approve discharge to SNF because last PT and OT notes were from  07/07/2020.  She requested repeat assessment from PT and OT.  Reordered PT and OT consult.

## 2020-07-10 NOTE — TOC Transition Note (Signed)
Transition of Care [facility]) - CM/SW Discharge Note   Patient Details  Name: Ian Gill MRN: [id number] Date of Birth: 10/05/1945  Transition of Care [facility]) CM/SW Contact:  Allayne Butcher, RN Phone Number: 07/10/2020, 12:58 PM   Clinical Narrative:    Patient medically cleared for discharge back to [facility].  [facility] is still pending but [facility] will still accept today.  Patient's son Loraine Leriche updated on discharge today.  [facility] has been called and patient is first up on the list for pickup.  Bedside RN Calling report.   Final next level of care: Skilled Nursing Facility Barriers to Discharge: Barriers Resolved   Patient Goals and CMS Choice Patient states their goals for this hospitalization and ongoing recovery are:: Family wants patient to go back to [facility] [employer].gov Compare Post Acute Care list provided to:: Patient Choice offered to / list presented to : Patient, Adult Children  Discharge Placement              Patient chooses bed at: [facility] Patient to be transferred to facility by: [facility] Name of family member notified: Loraine Leriche (son) Patient and family notified of of transfer: 07/10/20  Discharge Plan and Services   Discharge Planning Services: CM Consult Post Acute Care Choice: Skilled Nursing Facility          DME Arranged: N/A DME Agency: NA       HH Arranged: NA HH Agency: NA        Social Determinants of Health (SDOH) Interventions     Readmission Risk Interventions No flowsheet data found.

## 2020-07-10 NOTE — Care Management Important Message (Signed)
Important Message  Patient Details  Name: Ian Gill MRN: [id number] Date of Birth: [birth year]   Medicare Important Message Given:  Yes  Patient is in an isolation room so I talked with him by phone [phone]) and reviewed the Important Message from Medicare with him.  He is in agreement with his discharge plan.  I asked if he would like a copy of this form and he replied yes.  I will send via Secure e-mail to [email or web address] as he directed.  I wished him a speedy recovery and thanked him for his time.  Olegario Messier A Gerber Penza 07/10/2020, 11:38 AM

## 2020-07-10 NOTE — Discharge Summary (Addendum)
Physician Discharge Summary  IRIS TATSCH CLE:[id number] DOB: 09-Mar-1945 DOA: 07/06/2020  PCP: Barbette Reichmann, MD  Admit date: 07/06/2020 Discharge date: 07/10/2020  Discharge disposition: SNF   Recommendations for Outpatient Follow-Up:   Follow-up with physician at the nursing home within 3 days of discharge.   Discharge Diagnosis:   Active Problems:   Seizure (HCC)   COPD (chronic obstructive pulmonary disease) (HCC)   Stroke (HCC)   HLD (hyperlipidemia)   HTN (hypertension)   Physical deconditioning   COVID-19    Discharge Condition: Stable.  Diet recommendation:  Diet Order             Diet - low sodium heart healthy           Diet Heart Room service appropriate? Yes; Fluid consistency: Thin  Diet effective now                     Code Status: DNR     Hospital Course:    Mr. Ian Gill is a 75 y.o. male with medical history significant for COPD, seizure disorder, stroke, Parkinson's disease, hyperlipidemia, hypertension, alcohol use disorder, recently diagnosed with COVID-19 infection, who was brought to the hospital because of unwitnessed fall.  Reportedly, he was found on the floor at the group home. It is not clear whether he had a seizure prior to the fall.   He was admitted to the hospital for fall and suspected seizure.  Acute stroke was ruled out with MRI brain.  Fever was likely due to acute COVID-19 infection.  He was treated with Paxlovid.  He was seen in consultation by the neurologist and no changes were made to antiepileptics.  He was evaluated by PT and OT recommended further rehabilitation at a skilled nursing facility.  He is deemed stable for discharge to SNF today.        Medical Consultants:   Neurologist   Discharge Exam:    Vitals:   07/10/20 0026 07/10/20 0628 07/10/20 0800 07/10/20 1156  BP: 130/63 (!) 120/59 (!) 162/71 124/72  Pulse: (!) 57 73 (!) 55 75  Resp: 16 16 15 17   Temp: 97.7 F (36.5 C) (!)  97.5 F (36.4 C) 97.8 F (36.6 C) 98.1 F (36.7 C)  TempSrc: Oral Oral Oral Oral  SpO2: 93% 97% 96% 97%  Weight:      Height:         GEN: NAD SKIN: Abrasion on left knee.  Old scab wound on right knee. EYES: No pallor or icterus ENT: MMM CV: RRR PULM: CTA B ABD: soft, ND, NT, +BS CNS: AAO x 3, non focal EXT: No edema or tenderness   The results of significant diagnostics from this hospitalization (including imaging, microbiology, ancillary and laboratory) are listed below for reference.     Procedures and Diagnostic Studies:   MR ANGIO HEAD WO CONTRAST  Result Date: 07/06/2020 CLINICAL DATA:  Neuro deficit, acute stroke suspected. EXAM: MRI HEAD WITHOUT CONTRAST MRA HEAD WITHOUT CONTRAST MRA NECK WITHOUT AND WITH CONTRAST TECHNIQUE: Multiplanar, multi-echo pulse sequences of the brain and surrounding structures were acquired without intravenous contrast. Angiographic images of the Circle of Willis were acquired using MRA technique without intravenous contrast. Angiographic images of the neck were acquired using MRA technique without and with intravenous contrast. Carotid stenosis measurements (when applicable) are obtained utilizing NASCET criteria, using the distal internal carotid diameter as the denominator. CONTRAST:  64mL GADAVIST GADOBUTROL 1 MMOL/ML IV SOLN COMPARISON:  September 10, 2019. FINDINGS: MRI HEAD FINDINGS Brain: No acute infarction, hemorrhage, hydrocephalus, extra-axial collection or mass lesion. No pathologic intracranial enhancement. There are a few T2/FLAIR hyperintensities within the white matter, most likely related to chronic microvascular ischemic disease that is mild for age. Mild atrophy with ex vacuo ventricular dilation. Vascular: See below. Skull and upper cervical spine: Normal marrow signal. Sinuses/Orbits: Mild pansinus mucosal thickening. Unremarkable orbits. Other: No mastoid effusions. MRA HEAD FINDINGS Anterior circulation: No large vessel occlusion  or proximal hemodynamically significant stenosis. No aneurysm. Posterior circulation: No large vessel occlusion or proximal hemodynamically significant stenosis. Bilateral posterior communicating arteries. No aneurysm. MRA NECK FINDINGS Aortic arch: Great vessel origins are patent. Right carotid system: No visible significant (greater than 50%) stenosis. Left carotid system: Mild narrowing at the carotid bifurcation. No visible significant (greater than 50%) stenosis. Vertebral arteries: Left dominant. Limited evaluation of the origins. No visible significant (greater than 50%) stenosis. IMPRESSION: 1. No evidence of acute intracranial abnormality. 2. No large vessel occlusion or visible hemodynamically significant stenosis. Electronically Signed   By: Feliberto Harts MD   On: 07/06/2020 17:47   MR ANGIO NECK W WO CONTRAST  Result Date: 07/06/2020 CLINICAL DATA:  Neuro deficit, acute stroke suspected. EXAM: MRI HEAD WITHOUT CONTRAST MRA HEAD WITHOUT CONTRAST MRA NECK WITHOUT AND WITH CONTRAST TECHNIQUE: Multiplanar, multi-echo pulse sequences of the brain and surrounding structures were acquired without intravenous contrast. Angiographic images of the Circle of Willis were acquired using MRA technique without intravenous contrast. Angiographic images of the neck were acquired using MRA technique without and with intravenous contrast. Carotid stenosis measurements (when applicable) are obtained utilizing NASCET criteria, using the distal internal carotid diameter as the denominator. CONTRAST:  61mL GADAVIST GADOBUTROL 1 MMOL/ML IV SOLN COMPARISON:  September 10, 2019. FINDINGS: MRI HEAD FINDINGS Brain: No acute infarction, hemorrhage, hydrocephalus, extra-axial collection or mass lesion. No pathologic intracranial enhancement. There are a few T2/FLAIR hyperintensities within the white matter, most likely related to chronic microvascular ischemic disease that is mild for age. Mild atrophy with ex vacuo ventricular  dilation. Vascular: See below. Skull and upper cervical spine: Normal marrow signal. Sinuses/Orbits: Mild pansinus mucosal thickening. Unremarkable orbits. Other: No mastoid effusions. MRA HEAD FINDINGS Anterior circulation: No large vessel occlusion or proximal hemodynamically significant stenosis. No aneurysm. Posterior circulation: No large vessel occlusion or proximal hemodynamically significant stenosis. Bilateral posterior communicating arteries. No aneurysm. MRA NECK FINDINGS Aortic arch: Great vessel origins are patent. Right carotid system: No visible significant (greater than 50%) stenosis. Left carotid system: Mild narrowing at the carotid bifurcation. No visible significant (greater than 50%) stenosis. Vertebral arteries: Left dominant. Limited evaluation of the origins. No visible significant (greater than 50%) stenosis. IMPRESSION: 1. No evidence of acute intracranial abnormality. 2. No large vessel occlusion or visible hemodynamically significant stenosis. Electronically Signed   By: Feliberto Harts MD   On: 07/06/2020 17:47   MR BRAIN WO CONTRAST  Result Date: 07/06/2020 CLINICAL DATA:  Neuro deficit, acute stroke suspected. EXAM: MRI HEAD WITHOUT CONTRAST MRA HEAD WITHOUT CONTRAST MRA NECK WITHOUT AND WITH CONTRAST TECHNIQUE: Multiplanar, multi-echo pulse sequences of the brain and surrounding structures were acquired without intravenous contrast. Angiographic images of the Circle of Willis were acquired using MRA technique without intravenous contrast. Angiographic images of the neck were acquired using MRA technique without and with intravenous contrast. Carotid stenosis measurements (when applicable) are obtained utilizing NASCET criteria, using the distal internal carotid diameter as the denominator. CONTRAST:  67mL GADAVIST GADOBUTROL 1 MMOL/ML IV SOLN  COMPARISON:  September 10, 2019. FINDINGS: MRI HEAD FINDINGS Brain: No acute infarction, hemorrhage, hydrocephalus, extra-axial collection or  mass lesion. No pathologic intracranial enhancement. There are a few T2/FLAIR hyperintensities within the white matter, most likely related to chronic microvascular ischemic disease that is mild for age. Mild atrophy with ex vacuo ventricular dilation. Vascular: See below. Skull and upper cervical spine: Normal marrow signal. Sinuses/Orbits: Mild pansinus mucosal thickening. Unremarkable orbits. Other: No mastoid effusions. MRA HEAD FINDINGS Anterior circulation: No large vessel occlusion or proximal hemodynamically significant stenosis. No aneurysm. Posterior circulation: No large vessel occlusion or proximal hemodynamically significant stenosis. Bilateral posterior communicating arteries. No aneurysm. MRA NECK FINDINGS Aortic arch: Great vessel origins are patent. Right carotid system: No visible significant (greater than 50%) stenosis. Left carotid system: Mild narrowing at the carotid bifurcation. No visible significant (greater than 50%) stenosis. Vertebral arteries: Left dominant. Limited evaluation of the origins. No visible significant (greater than 50%) stenosis. IMPRESSION: 1. No evidence of acute intracranial abnormality. 2. No large vessel occlusion or visible hemodynamically significant stenosis. Electronically Signed   By: Feliberto Harts MD   On: 07/06/2020 17:47   DG Chest Portable 1 View  Result Date: 07/06/2020 CLINICAL DATA:  Reason for exam: Fall Pt has hx of COPD, parkinson's, seizures, and strokes. Pt is a former smoker. fall EXAM: PORTABLE CHEST 1 VIEW COMPARISON:  None. FINDINGS: Normal mediastinum and cardiac silhouette. Normal pulmonary vasculature. No evidence of effusion, infiltrate, or pneumothorax. No acute bony abnormality. IMPRESSION: No acute cardiopulmonary process. Electronically Signed   By: Genevive Bi M.D.   On: 07/06/2020 14:25   CT HEAD CODE STROKE WO CONTRAST  Result Date: 07/06/2020 CLINICAL DATA:  Code stroke.  Right-sided weakness EXAM: CT HEAD WITHOUT  CONTRAST TECHNIQUE: Contiguous axial images were obtained from the base of the skull through the vertex without intravenous contrast. COMPARISON:  09/10/2019 FINDINGS: Brain: There is no acute intracranial hemorrhage, mass effect, or edema. No new loss of gray-white differentiation. Prominence of the ventricles and sulci reflects minor generalized parenchymal volume loss. Patchy hypoattenuation in the supratentorial white matter is nonspecific but may reflect mild chronic microvascular ischemic changes. No extra-axial collection. Vascular: No hyperdense vessel. There is intracranial atherosclerotic calcification at the skull base. Skull: Unremarkable. Sinuses/Orbits: No acute abnormality. Other: Mastoid air cells are clear. ASPECTS ([place]ber[place] Stroke Program Early CT Score) - Ganglionic level infarction (caudate, lentiform nuclei, internal capsule, insula, M1-M3 cortex): 7 - Supraganglionic infarction (M4-M6 cortex): 3 Total score (0-10 with 10 being normal): 10 IMPRESSION: There is no acute intracranial hemorrhage or evidence of acute infarction. ASPECT score is 10. These results were communicated to Dr. Amada Jupiter at 1:32 pm on 07/06/2020 by text page via the [facility] messaging system. Electronically Signed   By: Guadlupe Spanish M.D.   On: 07/06/2020 13:34     Labs:   Basic Metabolic Panel: Recent Labs  Lab 07/06/20 1310 07/07/20 0511  NA 129* 132*  K 3.9 3.7  CL 97* 98  CO2 22 24  GLUCOSE 121* 101*  BUN 22 18  CREATININE 1.07 0.80  CALCIUM 9.1 8.8*   GFR Estimated Creatinine Clearance: 93.6 mL/min (by C-G formula based on SCr of 0.8 mg/dL). Liver Function Tests: Recent Labs  Lab 07/06/20 1310 07/07/20 0511  AST 89* 113*  ALT 24 28  ALKPHOS 47 48  BILITOT 1.1 1.1  PROT 6.7 6.3*  ALBUMIN 3.9 3.7   No results for input(s): LIPASE, AMYLASE in the last 168 hours. No results for input(s): AMMONIA  in the last 168 hours. Coagulation profile Recent Labs  Lab 07/06/20 1310  INR 1.1     CBC: Recent Labs  Lab 07/06/20 1310 07/07/20 0511  WBC 12.8* 8.5  NEUTROABS 10.0*  --   HGB 14.2 14.0  HCT 39.1 39.1  MCV 90.5 90.9  PLT 151 148*   Cardiac Enzymes: No results for input(s): CKTOTAL, CKMB, CKMBINDEX, TROPONINI in the last 168 hours. BNP: Invalid input(s): POCBNP CBG: Recent Labs  Lab 07/06/20 1307 07/08/20 0825 07/08/20 1153 07/08/20 1605 07/09/20 0802  GLUCAP 109* 113* 116* 125* 86   D-Dimer No results for input(s): DDIMER in the last 72 hours. Hgb A1c No results for input(s): HGBA1C in the last 72 hours. Lipid Profile No results for input(s): CHOL, HDL, LDLCALC, TRIG, CHOLHDL, LDLDIRECT in the last 72 hours. Thyroid function studies No results for input(s): TSH, T4TOTAL, T3FREE, THYROIDAB in the last 72 hours.  Invalid input(s): FREET3 Anemia work up No results for input(s): VITAMINB12, FOLATE, FERRITIN, TIBC, IRON, RETICCTPCT in the last 72 hours. Microbiology Recent Results (from the past 240 hour(s))  Resp Panel by RT-PCR (Flu A&B, Covid) Nasopharyngeal Swab     Status: Abnormal   Collection Time: 07/06/20  6:07 PM   Specimen: Nasopharyngeal Swab; Nasopharyngeal(NP) swabs in vial transport medium  Result Value Ref Range Status   SARS Coronavirus 2 by RT PCR POSITIVE (A) NEGATIVE Final    Comment: RESULT CALLED TO, READ BACK BY AND VERIFIED WITH: DEE GIVENS 1909 07/06/2020 [facility] (NOTE) SARS-CoV-2 target nucleic acids are DETECTED.  The SARS-CoV-2 RNA is generally detectable in upper respiratory specimens during the acute phase of infection. Positive results are indicative of the presence of the identified virus, but do not rule out bacterial infection or co-infection with other pathogens not detected by the test. Clinical correlation with patient history and other diagnostic information is necessary to determine patient infection status. The expected result is Negative.  Fact Sheet for  Patients: BloggerCourse.comhttps://www.fda.gov/media/152166/download  Fact Sheet for Healthcare Providers: SeriousBroker.ithttps://www.fda.gov/media/152162/download  This test is not yet approved or cleared by the [place] FDA and  has been authorized for detection and/or diagnosis of SARS-CoV-2 by FDA under an Emergency Use Authorization (EUA).  This EUA will remain in effect (meaning this test can be us ed) for the duration of  the COVID-19 declaration under Section 564(b)(1) of the Act, 21 U.S.C. section 360bbb-3(b)(1), unless the authorization is terminated or revoked sooner.     Influenza A by PCR NEGATIVE NEGATIVE Final   Influenza B by PCR NEGATIVE NEGATIVE Final    Comment: (NOTE) The Xpert Xpress SARS-CoV-2/FLU/RSV plus assay is intended as an aid in the diagnosis of influenza from Nasopharyngeal swab specimens and should not be used as a sole basis for treatment. Nasal washings and aspirates are unacceptable for Xpert Xpress SARS-CoV-2/FLU/RSV testing.  Fact Sheet for Patients: BloggerCourse.comhttps://www.fda.gov/media/152166/download  Fact Sheet for Healthcare Providers: SeriousBroker.ithttps://www.fda.gov/media/152162/download  This test is not yet approved or cleared by the [place] FDA and has been authorized for detection and/or diagnosis of SARS-CoV-2 by FDA under an Emergency Use Authorization (EUA). This EUA will remain in effect (meaning this test can be used) for the duration of the COVID-19 declaration under Section 564(b)(1) of the Act, 21 U.S.C. section 360bbb-3(b)(1), unless the authorization is terminated or revoked.  Performed at [facility], [address] [place], [place] [id number]      Discharge Instructions:   Discharge Instructions     Diet - low sodium heart healthy  Complete by: As directed    Increase activity slowly   Complete by: As directed       Allergies as of 07/10/2020       Reactions   Bupropion Other (See Comments)   Seizures Seizure seizures Seizure    Dilantin [phenytoin Sodium Extended]    Past issues with toxicity per pt's son   Iodinated Diagnostic Agents Swelling        Medication List     STOP taking these medications    lactulose 10 GM/15ML solution Commonly known as: CHRONULAC       TAKE these medications    acetaminophen 325 MG tablet Commonly known as: TYLENOL Take 2 tablets (650 mg total) by mouth every 6 (six) hours as needed for mild pain (or Fever >/= 101).   amLODipine 10 MG tablet Commonly known as: NORVASC Take 1 tablet (10 mg total) by mouth daily.   atorvastatin 20 MG tablet Commonly known as: LIPITOR Take 1 tablet (20 mg total) by mouth daily. Start taking on: July 13, 2020 What changed: These instructions start on July 13, 2020. If you are unsure what to do until then, ask your doctor or other care provider.   Cholecalciferol 125 MCG (5000 UT) capsule Take 5,000 Units by mouth daily.   clopidogrel 75 MG tablet Commonly known as: PLAVIX Take 1 tablet (75 mg total) by mouth daily.   divalproex 125 MG capsule Commonly known as: DEPAKOTE SPRINKLE Take 2 capsules (250 mg total) by mouth every 12 (twelve) hours.   donepezil 5 MG tablet Commonly known as: ARICEPT Take 5 mg by mouth daily.   folic acid 1 MG tablet Commonly known as: FOLVITE Take 1 tablet (1 mg total) by mouth daily.   ibuprofen 200 MG tablet Commonly known as: ADVIL Take 2 tablets by mouth every 6 (six) hours as needed.   lamoTRIgine 150 MG tablet Commonly known as: LAMICTAL Take 1 tablet (150 mg total) by mouth 2 (two) times daily.   levETIRAcetam 500 MG tablet Commonly known as: KEPPRA Take 3 tablets (1,500 mg total) by mouth 2 (two) times daily.   lisinopril 20 MG tablet Commonly known as: ZESTRIL Take 1 tablet (20 mg total) by mouth daily.   Melatonin 10 MG Tabs Take 1 tablet by mouth at bedtime.   Multi-Vitamin tablet Take 1 tablet by mouth daily.   propranolol 20 MG tablet Commonly known as:  INDERAL Take 10 mg by mouth 2 (two) times daily.   RA Fish Oil 1000 MG Caps Take 1 capsule by mouth daily.   sertraline 100 MG tablet Commonly known as: ZOLOFT Take 200 mg by mouth daily.   thiamine 100 MG tablet Take 1 tablet (100 mg total) by mouth daily.   zinc oxide 20 % ointment Apply 1 application topically as needed for irritation.          Time coordinating discharge: 32 minutes  Signed:  Mckinzi Eriksen  [facility] 07/10/2020, 12:00 PM   Pager on [email or web address]. If 7PM-7AM, please contact night-coverage at [email or web address]

## 2020-07-10 NOTE — Progress Notes (Signed)
Physical Therapy Treatment Patient Details Name: Ian Gill MRN: [id number] DOB: 04/21/1945 Today's Date: 07/10/2020    History of Present Illness Pt is a 75 y/o M with PMH: HTN, HLD, COPD, stroke, GERD, depression, anxiety, Parkinson's, alcohol abuse, and R ankle ORIF in 2021. Pt presented d/t weakness and falls at his facility in setting of recent COVID diagnosis.    PT Comments    Patient received in bed, he is agreeable to PT session. Reports no pain. He is mod independent with bed mobility. Needs min assist for sit to stand and for ambulation in room using RW. Patient is slightly unsteady at times, requiring cues and assist to manage rolling walker. He is limited by fatigue. Patient will continue to benefit from skilled PT while here to improve safety, strength and functional independence.     Follow Up Recommendations  SNF     Equipment Recommendations  None recommended by PT    Recommendations for Other Services       Precautions / Restrictions Precautions Precautions: Fall Restrictions Weight Bearing Restrictions: No    Mobility  Bed Mobility Overal bed mobility: Modified Independent Bed Mobility: Supine to Sit;Sit to Supine     Supine to sit: Modified independent (Device/Increase time) Sit to supine: Modified independent (Device/Increase time)   General bed mobility comments: good effort. Patient does not require physical assist, increased time and effort needed    Transfers Overall transfer level: Needs assistance Equipment used: Rolling walker (2 wheeled) Transfers: Sit to/from Stand Sit to Stand: Min assist         General transfer comment: Pt able to rise with only light assit to keep weight shifting forward, cues for UE use  Ambulation/Gait Ambulation/Gait assistance: Min assist Gait Distance (Feet): 90 Feet Assistive device: Rolling walker (2 wheeled) Gait Pattern/deviations: Step-through pattern;Decreased step length - right;Decreased step  length - left;Staggering right;Staggering left Gait velocity: decr   General Gait Details: Patient did three laps in room with RW, mild unsteadiness, feet occasionally outside AD, assist needed to sit down on commode safely. Leaning very heavily on RW.   Stairs             Wheelchair Mobility    Modified Rankin (Stroke Patients Only)       Balance Overall balance assessment: Needs assistance Sitting-balance support: Feet supported Sitting balance-Leahy Scale: Good     Standing balance support: Bilateral upper extremity supported;During functional activity Standing balance-Leahy Scale: Fair Standing balance comment: Pt requires heavy UE support on RW.  struggled to stay inside [place] with any dynamic standing acts                            Cognition Arousal/Alertness: Awake/alert Behavior During Therapy: WFL for tasks assessed/performed Overall Cognitive Status: Within Functional Limits for tasks assessed                                        Exercises      General Comments        Pertinent Vitals/Pain Pain Assessment: No/denies pain    Home Living                      Prior Function            PT Goals (current goals can now be found in the care plan section)  Acute Rehab PT Goals Patient Stated Goal: to get stronger PT Goal Formulation: With patient Time For Goal Achievement: 07/21/20 Potential to Achieve Goals: Good Progress towards PT goals: Progressing toward goals    Frequency    Min 2X/week      PT Plan Current plan remains appropriate    Co-evaluation              AM-PAC PT "6 Clicks" Mobility   Outcome Measure  Help needed turning from your back to your side while in a flat bed without using bedrails?: None Help needed moving from lying on your back to sitting on the side of a flat bed without using bedrails?: A Little Help needed moving to and from a bed to a chair (including a  wheelchair)?: A Little Help needed standing up from a chair using your arms (e.g., wheelchair or bedside chair)?: A Little Help needed to walk in hospital room?: A Lot Help needed climbing 3-5 steps with a railing? : A Lot 6 Click Score: 17    End of Session Equipment Utilized During Treatment: Gait belt Activity Tolerance: Patient tolerated treatment well;Patient limited by fatigue Patient left: in bed;with bed alarm set;with call bell/phone within reach Nurse Communication: Mobility status PT Visit Diagnosis: Muscle weakness (generalized) (M62.81);Other abnormalities of gait and mobility (R26.89);Difficulty in walking, not elsewhere classified (R26.2)     Time: 6578-4696 PT Time Calculation (min) (ACUTE ONLY): 18 min  Charges:  $Gait Training: 8-22 mins                    [employer], PT, GCS 07/10/20,4:31 PM

## 2021-04-01 ENCOUNTER — Ambulatory Visit: Payer: Medicare Other | Admitting: Urology

## 2021-04-01 ENCOUNTER — Other Ambulatory Visit: Payer: Self-pay

## 2021-04-01 ENCOUNTER — Encounter: Payer: Self-pay | Admitting: Urology

## 2021-04-01 VITALS — BP 134/73 | HR 81 | Ht 71.0 in | Wt 214.0 lb

## 2021-04-01 DIAGNOSIS — R3915 Urgency of urination: Secondary | ICD-10-CM

## 2021-04-01 DIAGNOSIS — R339 Retention of urine, unspecified: Secondary | ICD-10-CM | POA: Diagnosis not present

## 2021-04-01 DIAGNOSIS — N3941 Urge incontinence: Secondary | ICD-10-CM | POA: Diagnosis not present

## 2021-04-01 LAB — URINALYSIS, COMPLETE
Bilirubin, UA: NEGATIVE
Glucose, UA: NEGATIVE
Ketones, UA: NEGATIVE
Leukocytes,UA: NEGATIVE
Nitrite, UA: NEGATIVE
Protein,UA: NEGATIVE
RBC, UA: NEGATIVE
Specific Gravity, UA: 1.015 (ref 1.005–1.030)
Urobilinogen, Ur: 0.2 mg/dL (ref 0.2–1.0)
pH, UA: 6 (ref 5.0–7.5)

## 2021-04-01 LAB — MICROSCOPIC EXAMINATION
Bacteria, UA: NONE SEEN
Epithelial Cells (non renal): NONE SEEN /hpf (ref 0–10)

## 2021-04-01 LAB — BLADDER SCAN AMB NON-IMAGING: Scan Result: 143

## 2021-04-01 MED ORDER — SILODOSIN 8 MG PO CAPS: 8.0000 mg | ORAL_CAPSULE | Freq: Every day | ORAL | 1 refills | Status: DC

## 2021-04-01 NOTE — Progress Notes (Signed)
? ?04/01/2021 ?1:45 PM  ? ?Ian Gill ?30-Mar-1945 ?[id number] ? ?Referring provider: Vladimir Crofts, MD ?[facility] ?[place],  [place] [id number] ? ?Chief Complaint  ?Patient presents with  ? Urinary Urgency  ? ? ?HPI: ?Ian Gill is a 76 y.o. male referred for evaluation of urinary urgency.  He presents today with his son-in-law. ? ?1 year history of urinary frequency, urgency with urge incontinence ?Followed by neurology for seizure disorder and possible Parkinson's ?States he had similar symptoms in his 54s and saw a urologist who treated him for UTI with resolution ?Denies dysuria or gross hematuria ?No flank, abdominal or pelvic pain ? ? ?PMH: ?Past Medical History:  ?Diagnosis Date  ? Anxiety   ? COPD (chronic obstructive pulmonary disease) ([place])   ? Depression   ? Parkinson's disease ([place])   ? Seizures ([place])   ? Stroke [facility])   ? ? ?Surgical History: ?Past Surgical History:  ?Procedure Laterality Date  ? ANKLE CLOSED REDUCTION Left 09/11/2019  ? Procedure: CLOSED REDUCTION 2nd toe;  Surgeon: Samara Deist, DPM;  Location: [facility] ORS;  Service: Podiatry;  Laterality: Left;  ? ORIF ANKLE FRACTURE Right 09/11/2019  ? Procedure: OPEN REDUCTION INTERNAL FIXATION (ORIF) ANKLE FRACTURE;  Surgeon: Samara Deist, DPM;  Location: [facility] ORS;  Service: Podiatry;  Laterality: Right;  ? Shoulder surgery    ? SPINAL FUSION    ? cervical  ? TOTAL KNEE ARTHROPLASTY    ? ? ?Home Medications:  ?Allergies as of 04/01/2021   ? ?   Reactions  ? Bupropion Other (See Comments)  ? Seizures ?Seizure ?seizures ?Seizure  ? Dilantin [phenytoin Sodium Extended]   ? Past issues with toxicity per pt's son  ? Iodinated Contrast Media Swelling  ? ?  ? ?  ?Medication List  ?  ? ?  ? Accurate as of April 01, 2021  1:45 PM. If you have any questions, ask your nurse or doctor.  ?  ?  ? ?  ? ?acetaminophen 325 MG tablet ?Commonly known as: TYLENOL ?Take 2 tablets (650 mg total) by mouth every 6 (six) hours  as needed for mild pain (or Fever >/= 101). ?  ?amLODipine 10 MG tablet ?Commonly known as: NORVASC ?Take 1 tablet (10 mg total) by mouth daily. ?  ?atorvastatin 20 MG tablet ?Commonly known as: LIPITOR ?Take 1 tablet (20 mg total) by mouth daily. ?  ?carbidopa-levodopa 25-100 MG tablet ?Commonly known as: SINEMET IR ?  ?Cholecalciferol 125 MCG (5000 UT) capsule ?Take 5,000 Units by mouth daily. ?  ?clopidogrel 75 MG tablet ?Commonly known as: PLAVIX ?Take 1 tablet (75 mg total) by mouth daily. ?  ?divalproex 125 MG capsule ?Commonly known as: DEPAKOTE SPRINKLE ?Take 2 capsules (250 mg total) by mouth every 12 (twelve) hours. ?  ?donepezil 5 MG tablet ?Commonly known as: ARICEPT ?Take 5 mg by mouth daily. ?  ?folic acid 1 MG tablet ?Commonly known as: FOLVITE ?Take 1 tablet (1 mg total) by mouth daily. ?  ?ibuprofen 200 MG tablet ?Commonly known as: ADVIL ?Take 2 tablets by mouth every 6 (six) hours as needed. ?  ?lamoTRIgine 150 MG tablet ?Commonly known as: LAMICTAL ?Take 1 tablet (150 mg total) by mouth 2 (two) times daily. ?  ?levETIRAcetam 500 MG tablet ?Commonly known as: KEPPRA ?Take 3 tablets (1,500 mg total) by mouth 2 (two) times daily. ?  ?lisinopril 20 MG tablet ?Commonly known as: ZESTRIL ?Take 1 tablet (20 mg total) by  mouth daily. ?  ?Melatonin 10 MG Tabs ?Take 1 tablet by mouth at bedtime. ?  ?Multi-Vitamin tablet ?Take 1 tablet by mouth daily. ?  ?propranolol 20 MG tablet ?Commonly known as: INDERAL ?Take 10 mg by mouth 2 (two) times daily. ?  ?RA Fish Oil 1000 MG Caps ?Take 1 capsule by mouth daily. ?  ?sertraline 100 MG tablet ?Commonly known as: ZOLOFT ?Take 200 mg by mouth daily. ?  ?thiamine 100 MG tablet ?Take 1 tablet (100 mg total) by mouth daily. ?  ?traZODone 50 MG tablet ?Commonly known as: DESYREL ?Take 50 mg by mouth at bedtime. ?  ?zinc oxide 20 % ointment ?Apply 1 application topically as needed for irritation. ?  ? ?  ? ? ?Allergies:  ?Allergies  ?Allergen Reactions  ? Bupropion  Other (See Comments)  ?  Seizures ? ?Seizure ?seizures ?Seizure ?  ? Dilantin [Phenytoin Sodium Extended]   ?  Past issues with toxicity per pt's son  ? Iodinated Contrast Media Swelling  ? ? ?Family History: ?Family History  ?Problem Relation Age of Onset  ? COPD Father   ? ? ?Social History:  reports that he has quit smoking. He has never used smokeless tobacco. He reports current alcohol use of about 3.0 standard drinks per week. He reports that he does not use drugs. ? ? ?Physical Exam: ?BP 134/73   Pulse 81   Ht 5\' 11"  (1.803 m)   Wt 214 lb (97.1 kg)   BMI 29.85 kg/m?   ?Constitutional:  Alert and oriented, No acute distress. ?HEENT: [place] AT, moist mucus membranes.  Trachea midline, no masses. ?Cardiovascular: No clubbing, cyanosis, or edema. ?Respiratory: Normal respiratory effort, no increased work of breathing. ?GI: Abdomen is soft, nontender, nondistended, no abdominal masses ?GU: Prostate 35 g, smooth without nodules ?Psychiatric: Normal mood and affect. ? ?Laboratory Data: ? ?Urinalysis ?Dipstick/microscopy negative ? ? ?Assessment & Plan:   ? ?1.  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 ?2.  Urge incontinence ?As above ? ?3. Incomplete bladder emptying ?Trial silodosin 4 mg daily ?Follow-up 1 month for symptom check and repeat bladder scan for PVR ?If no improvement trial of a beta 3 agonist ? ? ?Abbie Sons, MD ? ?[place] ?[address], [address] ?[place], [place] [address] ?[phone] ? ?

## 2021-05-02 ENCOUNTER — Ambulatory Visit: Payer: Medicare Other | Admitting: Urology

## 2021-05-08 ENCOUNTER — Ambulatory Visit: Payer: Medicare Other | Admitting: Urology

## 2021-05-27 ENCOUNTER — Ambulatory Visit: Payer: Medicare Other | Admitting: Urology

## 2021-05-31 ENCOUNTER — Ambulatory Visit: Payer: Medicare Other | Admitting: Urology

## 2021-05-31 ENCOUNTER — Encounter: Payer: Self-pay | Admitting: Urology

## 2021-06-04 ENCOUNTER — Other Ambulatory Visit
Admission: RE | Admit: 2021-06-04 | Discharge: 2021-06-04 | Disposition: A | Discharge status: Home / Self Care | Payer: Medicare Other | Attending: Ophthalmology | Admitting: Ophthalmology

## 2021-06-04 DIAGNOSIS — H532 Diplopia: Secondary | ICD-10-CM | POA: Insufficient documentation

## 2021-06-05 LAB — ACETYLCHOLINE RECEPTOR, BINDING: Acety choline binding ab: <0.03 nmol/L (ref 0.00–0.24)

## 2021-06-28 ENCOUNTER — Encounter: Payer: Self-pay | Admitting: Urology

## 2021-06-28 ENCOUNTER — Ambulatory Visit (INDEPENDENT_AMBULATORY_CARE_PROVIDER_SITE_OTHER): Payer: Medicare Other | Admitting: Urology

## 2021-06-28 VITALS — BP 119/82 | HR 72 | Ht 71.0 in | Wt 220.0 lb

## 2021-06-28 DIAGNOSIS — N401 Enlarged prostate with lower urinary tract symptoms: Secondary | ICD-10-CM | POA: Diagnosis not present

## 2021-06-28 DIAGNOSIS — R3915 Urgency of urination: Secondary | ICD-10-CM | POA: Diagnosis not present

## 2021-06-28 DIAGNOSIS — R339 Retention of urine, unspecified: Secondary | ICD-10-CM

## 2021-06-28 DIAGNOSIS — N3941 Urge incontinence: Secondary | ICD-10-CM

## 2021-06-28 LAB — BLADDER SCAN AMB NON-IMAGING: Scan Result: 138

## 2021-06-28 MED ORDER — MIRABEGRON ER 50 MG PO TB24: 50.0000 mg | ORAL_TABLET | Freq: Every day | ORAL | 0 refills | Status: DC

## 2021-06-28 NOTE — Progress Notes (Signed)
06/28/2021 12:35 PM   Cy Bresee Dunbar 04/03/1945 [id number]  Referring provider: Barbette Reichmann, MD [address] [facility] [place],  [place] [address]  Chief Complaint  Patient presents with   Follow-up    HPI: Ian Gill is a 76 y.o. male presents for follow-up visit.  Refer to my prior note 04/01/2021 Has noted some improvement on silodosin though still with frequency, urgency and urge incontinence Denies dysuria, gross hematuria   PMH: Past Medical History:  Diagnosis Date   Anxiety    COPD (chronic obstructive pulmonary disease) (HCC)    Depression    Parkinson's disease (HCC)    Seizures (HCC)    Stroke [facility])     Surgical History: Past Surgical History:  Procedure Laterality Date   ANKLE CLOSED REDUCTION Left 09/11/2019   Procedure: CLOSED REDUCTION 2nd toe;  Surgeon: Gwyneth Revels, DPM;  Location: [facility] ORS;  Service: Podiatry;  Laterality: Left;   ORIF ANKLE FRACTURE Right 09/11/2019   Procedure: OPEN REDUCTION INTERNAL FIXATION (ORIF) ANKLE FRACTURE;  Surgeon: Gwyneth Revels, DPM;  Location: [facility] ORS;  Service: Podiatry;  Laterality: Right;   Shoulder surgery     SPINAL FUSION     cervical   TOTAL KNEE ARTHROPLASTY      Home Medications:  Allergies as of 06/28/2021       Reactions   Bupropion Other (See Comments)   Seizures Seizure seizures Seizure   Dilantin [phenytoin Sodium Extended]    Past issues with toxicity per pt's son   Iodinated Contrast Media Swelling        Medication List        Accurate as of June 28, 2021 12:35 PM. If you have any questions, ask your nurse or doctor.          acetaminophen 325 MG tablet Commonly known as: TYLENOL Take 2 tablets (650 mg total) by mouth every 6 (six) hours as needed for mild pain (or Fever >/= 101).   amLODipine 10 MG tablet Commonly known as: NORVASC Take 1 tablet (10 mg total) by mouth daily.   atorvastatin 20 MG tablet Commonly known as: LIPITOR Take 1  tablet (20 mg total) by mouth daily.   carbidopa-levodopa 25-100 MG tablet Commonly known as: SINEMET IR   Cholecalciferol 125 MCG (5000 UT) capsule Take 5,000 Units by mouth daily.   clopidogrel 75 MG tablet Commonly known as: PLAVIX Take 1 tablet (75 mg total) by mouth daily.   divalproex 125 MG capsule Commonly known as: DEPAKOTE SPRINKLE Take 2 capsules (250 mg total) by mouth every 12 (twelve) hours.   donepezil 5 MG tablet Commonly known as: ARICEPT Take 5 mg by mouth daily.   folic acid 1 MG tablet Commonly known as: FOLVITE Take 1 tablet (1 mg total) by mouth daily.   ibuprofen 200 MG tablet Commonly known as: ADVIL Take 2 tablets by mouth every 6 (six) hours as needed.   lamoTRIgine 150 MG tablet Commonly known as: LAMICTAL Take 1 tablet (150 mg total) by mouth 2 (two) times daily.   levETIRAcetam 1000 MG tablet Commonly known as: KEPPRA Take 1,000 mg by mouth 2 (two) times daily. What changed: Another medication with the same name was removed. Continue taking this medication, and follow the directions you see here. Changed by: Riki Altes, MD   lisinopril 20 MG tablet Commonly known as: ZESTRIL Take 1 tablet (20 mg total) by mouth daily.   Melatonin 10 MG Tabs Take 1 tablet by mouth  at bedtime.   Multi-Vitamin tablet Take 1 tablet by mouth daily.   propranolol 20 MG tablet Commonly known as: INDERAL Take 10 mg by mouth 2 (two) times daily.   RA Fish Oil 1000 MG Caps Take 1 capsule by mouth daily.   sertraline 100 MG tablet Commonly known as: ZOLOFT Take 200 mg by mouth daily.   silodosin 8 MG Caps capsule Commonly known as: RAPAFLO Take 1 capsule (8 mg total) by mouth daily with breakfast.   thiamine 100 MG tablet Take 1 tablet (100 mg total) by mouth daily.   traZODone 50 MG tablet Commonly known as: DESYREL Take 50 mg by mouth at bedtime.   zinc oxide 20 % ointment Apply 1 application topically as needed for irritation.         Allergies:  Allergies  Allergen Reactions   Bupropion Other (See Comments)    Seizures  Seizure seizures Seizure    Dilantin [Phenytoin Sodium Extended]     Past issues with toxicity per pt's son   Iodinated Contrast Media Swelling    Family History: Family History  Problem Relation Age of Onset   COPD Father     Social History:  reports that he has quit smoking. He has never used smokeless tobacco. He reports current alcohol use of about 3.0 standard drinks per week. He reports that he does not use drugs.   Physical Exam: BP 119/82   Pulse 72   Ht 5\' 11"  (1.803 m)   Wt 220 lb (99.8 kg)   BMI 30.68 kg/m   Constitutional:  Alert and oriented, No acute distress. HEENT: [place] AT Psychiatric: Normal mood and affect.   Assessment & Plan:    1.  BPH with incomplete bladder emptying Some improvement on silodosin-continue PVR today stable at 138 mL 6 month follow-up with repeat PVR  2.  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, [address] [place], [place] [phone]

## 2022-01-02 ENCOUNTER — Encounter: Payer: Self-pay | Admitting: Urology

## 2022-01-02 ENCOUNTER — Ambulatory Visit (INDEPENDENT_AMBULATORY_CARE_PROVIDER_SITE_OTHER): Payer: Medicare Other | Admitting: Urology

## 2022-01-02 VITALS — BP 138/81 | HR 80 | Ht 71.0 in | Wt 226.0 lb

## 2022-01-02 DIAGNOSIS — R3915 Urgency of urination: Secondary | ICD-10-CM

## 2022-01-02 DIAGNOSIS — N401 Enlarged prostate with lower urinary tract symptoms: Secondary | ICD-10-CM

## 2022-01-02 DIAGNOSIS — R339 Retention of urine, unspecified: Secondary | ICD-10-CM | POA: Diagnosis not present

## 2022-01-02 DIAGNOSIS — N3941 Urge incontinence: Secondary | ICD-10-CM | POA: Diagnosis not present

## 2022-01-02 LAB — BLADDER SCAN AMB NON-IMAGING: Scan Result: 166

## 2022-01-02 MED ORDER — GEMTESA 75 MG PO TABS: 75.0000 mg | ORAL_TABLET | Freq: Every day | ORAL | 0 refills | Status: DC

## 2022-01-02 NOTE — Progress Notes (Signed)
01/02/2022 10:56 AM   Ian Gill Sep 25, 1945 [id number]  Referring provider: Barbette Reichmann, MD [address] [facility] [place],  [place] [address]  Chief Complaint  Patient presents with   Urinary Retention    HPI: Ian Gill is a 76 y.o. male presents for follow-up visit.  He presents today with his son  Urinary frequency and urgency improved on Myrbetriq 50 mg though he is requesting a higher strength, additional or different medication for the frequency and urgency which is still bothersome He is taking silodosin 4 mg daily No dysuria or gross hematuria No flank, abdominal or pelvic pain   PMH: Past Medical History:  Diagnosis Date   Anxiety    COPD (chronic obstructive pulmonary disease) (HCC)    Depression    Parkinson's disease    Seizures (HCC)    Stroke [facility])     Surgical History: Past Surgical History:  Procedure Laterality Date   ANKLE CLOSED REDUCTION Left 09/11/2019   Procedure: CLOSED REDUCTION 2nd toe;  Surgeon: Gwyneth Revels, DPM;  Location: [facility] ORS;  Service: Podiatry;  Laterality: Left;   ORIF ANKLE FRACTURE Right 09/11/2019   Procedure: OPEN REDUCTION INTERNAL FIXATION (ORIF) ANKLE FRACTURE;  Surgeon: Gwyneth Revels, DPM;  Location: [facility] ORS;  Service: Podiatry;  Laterality: Right;   Shoulder surgery     SPINAL FUSION     cervical   TOTAL KNEE ARTHROPLASTY      Home Medications:  Allergies as of 01/02/2022       Reactions   Bupropion Other (See Comments)   Seizures Seizure seizures Seizure   Dilantin [phenytoin Sodium Extended]    Past issues with toxicity per pt's son   Iodinated Contrast Media Swelling        Medication List        Accurate as of January 02, 2022 10:56 AM. If you have any questions, ask your nurse or doctor.          STOP taking these medications    amLODipine 10 MG tablet Commonly known as: NORVASC Stopped by: Riki Altes, MD   lamoTRIgine 150 MG tablet Commonly  known as: LAMICTAL Stopped by: Riki Altes, MD   lisinopril 20 MG tablet Commonly known as: ZESTRIL Stopped by: Riki Altes, MD       TAKE these medications    acetaminophen 325 MG tablet Commonly known as: TYLENOL Take 2 tablets (650 mg total) by mouth every 6 (six) hours as needed for mild pain (or Fever >/= 101).   atorvastatin 20 MG tablet Commonly known as: LIPITOR Take 1 tablet (20 mg total) by mouth daily.   carbidopa-levodopa 25-100 MG tablet Commonly known as: SINEMET IR   Cholecalciferol 125 MCG (5000 UT) capsule Take 5,000 Units by mouth daily.   clopidogrel 75 MG tablet Commonly known as: PLAVIX Take 1 tablet (75 mg total) by mouth daily.   divalproex 125 MG capsule Commonly known as: DEPAKOTE SPRINKLE Take 2 capsules (250 mg total) by mouth every 12 (twelve) hours.   donepezil 5 MG tablet Commonly known as: ARICEPT Take 5 mg by mouth daily.   folic acid 1 MG tablet Commonly known as: FOLVITE Take 1 tablet (1 mg total) by mouth daily.   ibuprofen 200 MG tablet Commonly known as: ADVIL Take 2 tablets by mouth every 6 (six) hours as needed.   levETIRAcetam 1000 MG tablet Commonly known as: KEPPRA Take 1,000 mg by mouth 2 (two) times daily.   Melatonin  10 MG Tabs Take 1 tablet by mouth at bedtime.   mirabegron ER 50 MG Tb24 tablet Commonly known as: MYRBETRIQ Take 1 tablet (50 mg total) by mouth daily.   Multi-Vitamin tablet Take 1 tablet by mouth daily.   propranolol 20 MG tablet Commonly known as: INDERAL Take 10 mg by mouth 2 (two) times daily.   RA Fish Oil 1000 MG Caps Take 1 capsule by mouth daily.   sertraline 100 MG tablet Commonly known as: ZOLOFT Take 200 mg by mouth daily.   silodosin 8 MG Caps capsule Commonly known as: RAPAFLO Take 1 capsule (8 mg total) by mouth daily with breakfast.   thiamine 100 MG tablet Commonly known as: VITAMIN B1 Take 1 tablet (100 mg total) by mouth daily.   traZODone 50 MG  tablet Commonly known as: DESYREL Take 50 mg by mouth at bedtime.   zinc oxide 20 % ointment Apply 1 application topically as needed for irritation.        Allergies:  Allergies  Allergen Reactions   Bupropion Other (See Comments)    Seizures  Seizure seizures Seizure    Dilantin [Phenytoin Sodium Extended]     Past issues with toxicity per pt's son   Iodinated Contrast Media Swelling    Family History: Family History  Problem Relation Age of Onset   COPD Father     Social History:  reports that he has quit smoking. He has never used smokeless tobacco. He reports current alcohol use of about 3.0 standard drinks of alcohol per week. He reports that he does not use drugs.   Physical Exam: BP 138/81   Pulse 80   Ht 5\' 11"  (1.803 m)   Wt 226 lb (102.5 kg)   BMI 31.52 kg/m   Constitutional:  Alert and oriented, No acute distress. HEENT: [place] AT Psychiatric: Normal mood and affect.   Assessment & Plan:    1.  BPH with incomplete bladder emptying Will increase silodosin to 8 mg PVR today stable at 166 mL 6 month follow-up with repeat PVR  2.  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  He will hold Myrbetriq for the next 28 days.  If he feels is more effective will send in Rx  Would not recommend adding an anticholinergic due to elevated residual Not a candidate for Botox secondary to elevated residual   Leslye Peer, MD  [facility] [address] [place], [place] [phone]

## 2022-09-24 ENCOUNTER — Other Ambulatory Visit: Payer: Self-pay | Admitting: Student

## 2022-09-24 DIAGNOSIS — G20C Parkinsonism, unspecified: Secondary | ICD-10-CM

## 2022-12-11 ENCOUNTER — Ambulatory Visit: Payer: Medicare HMO | Admitting: Urology

## 2022-12-11 ENCOUNTER — Encounter: Payer: Self-pay | Admitting: Urology

## 2022-12-11 DIAGNOSIS — R339 Retention of urine, unspecified: Secondary | ICD-10-CM

## 2022-12-11 DIAGNOSIS — N3941 Urge incontinence: Secondary | ICD-10-CM | POA: Diagnosis not present

## 2022-12-11 DIAGNOSIS — N401 Enlarged prostate with lower urinary tract symptoms: Secondary | ICD-10-CM

## 2022-12-11 LAB — BLADDER SCAN AMB NON-IMAGING: Scan Result: 332

## 2022-12-11 MED ORDER — SILODOSIN 8 MG PO CAPS: 8.0000 mg | ORAL_CAPSULE | Freq: Every day | ORAL | 11 refills | Status: AC

## 2022-12-11 MED ORDER — MIRABEGRON ER 50 MG PO TB24
50.0000 mg | ORAL_TABLET | Freq: Every day | ORAL | 11 refills | Status: AC
Start: 2022-12-11 — End: ?

## 2022-12-11 NOTE — Progress Notes (Signed)
I,Amy L Pierron,acting as a scribe for Riki Altes, MD.,have documented all relevant documentation on the behalf of Riki Altes, MD,as directed by  Riki Altes, MD while in the presence of Riki Altes, MD.  12/11/2022 11:41 AM   Tawni Pummel Artist [birth year] [id number]  Referring provider: Barbette Reichmann, MD [address] [facility] [place],  [place] [address]  Chief Complaint  Patient presents with   Urinary Retention   Urologic History: 1. BPH Incomplete bladder emptying, PVR 150-300 mL. Silodosin 8 mg daily.  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 Beta-3 agonist therapy.   HPI: Ian Gill is a 77 y.o. male presents for a follow up visit.  Stable lower urinary tract symptoms since his last visit. Remains on Gemtesa, however is paying $118 per month.  Remains on Silodosin.  No dysuria or gross hematuria. No flank, abdominal, or pelvic pain.    PMH: Past Medical History:  Diagnosis Date   Anxiety    COPD (chronic obstructive pulmonary disease) (HCC)    Depression    Parkinson's disease (HCC)    Seizures (HCC)    Stroke [facility])     Surgical History: Past Surgical History:  Procedure Laterality Date   ANKLE CLOSED REDUCTION Left 09/11/2019   Procedure: CLOSED REDUCTION 2nd toe;  Surgeon: Gwyneth Revels, DPM;  Location: [facility] ORS;  Service: Podiatry;  Laterality: Left;   ORIF ANKLE FRACTURE Right 09/11/2019   Procedure: OPEN REDUCTION INTERNAL FIXATION (ORIF) ANKLE FRACTURE;  Surgeon: Gwyneth Revels, DPM;  Location: [facility] ORS;  Service: Podiatry;  Laterality: Right;   Shoulder surgery     SPINAL FUSION     cervical   TOTAL KNEE ARTHROPLASTY      Home Medications:  Allergies as of 12/11/2022       Reactions   Bupropion Other (See Comments)   Seizures Seizure seizures Seizure   Dilantin [phenytoin Sodium Extended]    Past issues with toxicity per pt's son   Iodinated  Contrast Media Swelling        Medication List        Accurate as of December 11, 2022 11:41 AM. If you have any questions, ask your nurse or doctor.          STOP taking these medications    acetaminophen 325 MG tablet Commonly known as: TYLENOL Stopped by: Riki Altes   divalproex 125 MG capsule Commonly known as: DEPAKOTE SPRINKLE Stopped by: Verna Czech Markis Langland   Gemtesa 75 MG Tabs Generic drug: Vibegron Stopped by: Riki Altes   Melatonin 10 MG Tabs Stopped by: Riki Altes   Multi-Vitamin tablet Stopped by: Riki Altes   thiamine 100 MG tablet Commonly known as: VITAMIN B1 Stopped by: Riki Altes   zinc oxide 20 % ointment Stopped by: Riki Altes       TAKE these medications    amLODipine 10 MG tablet Commonly known as: NORVASC Take 10 mg by mouth daily.   atorvastatin 20 MG tablet Commonly known as: LIPITOR Take 1 tablet (20 mg total) by mouth daily.   carbidopa-levodopa 25-100 MG tablet Commonly known as: SINEMET IR   Cerovite Senior Tabs Take by mouth. 0.4 mg-300 mg-250 mg   Cholecalciferol 125 MCG (5000 UT) capsule Take 5,000 Units by mouth daily.   clopidogrel 75 MG tablet Commonly known as: PLAVIX Take 1 tablet (75 mg total) by mouth daily.   donepezil 5 MG tablet Commonly  known as: ARICEPT Take 5 mg by mouth daily.   folic acid 1 MG tablet Commonly known as: FOLVITE Take 1 tablet (1 mg total) by mouth daily.   ibuprofen 200 MG tablet Commonly known as: ADVIL Take 2 tablets by mouth every 6 (six) hours as needed.   lamoTRIgine 150 MG tablet Commonly known as: LAMICTAL Take 1 tablet by mouth 2 (two) times daily.   levETIRAcetam 750 MG tablet Commonly known as: KEPPRA Take 750 mg by mouth 2 (two) times daily. What changed: Another medication with the same name was removed. Continue taking this medication, and follow the directions you see here. Changed by: Riki Altes   lisinopril 20 MG  tablet Commonly known as: ZESTRIL Take 20 mg by mouth daily.   loperamide 2 MG capsule Commonly known as: IMODIUM Take by mouth.  Take 2 mg by mouth 4 (four) times daily as needed for Diarrhea   mirabegron ER 50 MG Tb24 tablet Commonly known as: MYRBETRIQ Take 1 tablet (50 mg total) by mouth daily. Started by: Riki Altes   propranolol 20 MG tablet Commonly known as: INDERAL Take 10 mg by mouth 2 (two) times daily.   propranolol 10 MG tablet Commonly known as: INDERAL Take 0.5 tablets by mouth 2 (two) times daily.   RA Fish Oil 1000 MG Caps Take 1 capsule by mouth daily.   sertraline 100 MG tablet Commonly known as: ZOLOFT Take 200 mg by mouth daily.   silodosin 8 MG Caps capsule Commonly known as: RAPAFLO Take 1 capsule (8 mg total) by mouth daily with breakfast.   traZODone 50 MG tablet Commonly known as: DESYREL Take 50 mg by mouth at bedtime.        Allergies:  Allergies  Allergen Reactions   Bupropion Other (See Comments)    Seizures  Seizure seizures Seizure    Dilantin [Phenytoin Sodium Extended]     Past issues with toxicity per pt's son   Iodinated Contrast Media Swelling    Family History: Family History  Problem Relation Age of Onset   COPD Father     Social History:  reports that he has quit smoking. He has never used smokeless tobacco. He reports current alcohol use of about 3.0 standard drinks of alcohol per week. He reports that he does not use drugs.   Physical Exam: There were no vitals taken for this visit.  Constitutional:  Alert and oriented, No acute distress. HEENT: [place] AT Respiratory: Normal respiratory effort, no increased work of breathing. Psychiatric: Normal mood and affect.   Assessment & Plan:    1.  BPH with incomplete bladder emptying PVR today 332 mL Continue Silodosin 1 year follow-up with repeat PVR Call earlier for worsening lower urinary tract symptoms.   2.  Urge incontinence Gemtesa becoming cost  prohibitive.  He was previously on Myrbetriq 50 mg which was effective and will discontinue Gemtesa and restart Myrbetriq.   I have reviewed the above documentation for accuracy and completeness, and I agree with the above.   Riki Altes, MD  [facility] [address], [address] [place], [place] [address] [phone]

## 2022-12-19 ENCOUNTER — Telehealth: Payer: Self-pay | Admitting: Urology

## 2022-12-29 ENCOUNTER — Telehealth: Payer: Self-pay

## 2022-12-29 NOTE — Telephone Encounter (Signed)
Patient called and left a message asking if he could try Solifenacin medication as this suppose to be cheaper for him per insurance. Patient states when calling back and he does not answer it is ok to leave a detailed message.

## 2022-12-29 NOTE — Telephone Encounter (Signed)
Solifenacin has a much higher side effect profile of changes, dry mouth, constipation and may worsen his bladder emptying.  He can try the 5 mg dose for 30 days but will need a PA visit with bladder scan in 1 month

## 2022-12-30 NOTE — Telephone Encounter (Signed)
Pt calls triage line requesting information on med change, advised pt of Dr. Heywood Footman recommendations below. Pt states he does not want to risk urinary retention and therefore does not want to proceed with Solifenacin at this time.

## 2023-05-06 DIAGNOSIS — G20A1 Parkinson's disease without dyskinesia, without mention of fluctuations: Secondary | ICD-10-CM | POA: Diagnosis present

## 2023-06-04 ENCOUNTER — Emergency Department
Admission: EM | Admit: 2023-06-04 | Discharge: 2023-06-05 | Disposition: A | Discharge status: Home / Self Care | Attending: Emergency Medicine | Admitting: Emergency Medicine

## 2023-06-04 ENCOUNTER — Emergency Department

## 2023-06-04 ENCOUNTER — Other Ambulatory Visit: Payer: Self-pay

## 2023-06-04 DIAGNOSIS — Z79899 Other long term (current) drug therapy: Secondary | ICD-10-CM | POA: Insufficient documentation

## 2023-06-04 DIAGNOSIS — G20C Parkinsonism, unspecified: Secondary | ICD-10-CM | POA: Insufficient documentation

## 2023-06-04 DIAGNOSIS — W19XXXA Unspecified fall, initial encounter: Secondary | ICD-10-CM | POA: Diagnosis not present

## 2023-06-04 DIAGNOSIS — S82892A Other fracture of left lower leg, initial encounter for closed fracture: Secondary | ICD-10-CM | POA: Diagnosis present

## 2023-06-04 LAB — CBC WITH DIFFERENTIAL/PLATELET
Abs Immature Granulocytes: 0.02 10*3/uL (ref 0.00–0.07)
Basophils Absolute: 0 10*3/uL (ref 0.0–0.1)
Basophils Relative: 1 %
Eosinophils Absolute: 0 10*3/uL (ref 0.0–0.5)
Eosinophils Relative: 0 %
HCT: 39.8 % (ref 39.0–52.0)
Hemoglobin: 13.5 g/dL (ref 13.0–17.0)
Immature Granulocytes: 0 %
Lymphocytes Relative: 14 %
Lymphs Abs: 0.7 10*3/uL (ref 0.7–4.0)
MCH: 32.8 pg (ref 26.0–34.0)
MCHC: 33.9 g/dL (ref 30.0–36.0)
MCV: 96.8 fL (ref 80.0–100.0)
Monocytes Absolute: 0.9 10*3/uL (ref 0.1–1.0)
Monocytes Relative: 16 %
Neutro Abs: 3.8 10*3/uL (ref 1.7–7.7)
Neutrophils Relative %: 69 %
Platelets: 144 10*3/uL — ABNORMAL LOW (ref 150–400)
RBC: 4.11 MIL/uL — ABNORMAL LOW (ref 4.22–5.81)
RDW: 12.5 % (ref 11.5–15.5)
WBC: 5.4 10*3/uL (ref 4.0–10.5)
nRBC: 0 % (ref 0.0–0.2)

## 2023-06-04 LAB — URINALYSIS, ROUTINE W REFLEX MICROSCOPIC
Bilirubin Urine: NEGATIVE
Glucose, UA: NEGATIVE mg/dL
Hgb urine dipstick: NEGATIVE
Ketones, ur: 5 mg/dL — AB
Leukocytes,Ua: NEGATIVE
Nitrite: NEGATIVE
Protein, ur: NEGATIVE mg/dL
Specific Gravity, Urine: 1.014 (ref 1.005–1.030)
pH: 5 (ref 5.0–8.0)

## 2023-06-04 LAB — COMPREHENSIVE METABOLIC PANEL WITH GFR
ALT: 8 U/L (ref 0–44)
AST: 44 U/L — ABNORMAL HIGH (ref 15–41)
Albumin: 4 g/dL (ref 3.5–5.0)
Alkaline Phosphatase: 47 U/L (ref 38–126)
Anion gap: 12 (ref 5–15)
BUN: 19 mg/dL (ref 8–23)
CO2: 23 mmol/L (ref 22–32)
Calcium: 9.2 mg/dL (ref 8.9–10.3)
Chloride: 106 mmol/L (ref 98–111)
Creatinine, Ser: 0.91 mg/dL (ref 0.61–1.24)
GFR, Estimated: >60 mL/min (ref >60)
Glucose, Bld: 98 mg/dL (ref 70–99)
Potassium: 3.9 mmol/L (ref 3.5–5.1)
Sodium: 141 mmol/L (ref 135–145)
Total Bilirubin: 1.3 mg/dL — ABNORMAL HIGH (ref 0.0–1.2)
Total Protein: 6.5 g/dL (ref 6.5–8.1)

## 2023-06-04 LAB — BRAIN NATRIURETIC PEPTIDE: B Natriuretic Peptide: 172.4 pg/mL — ABNORMAL HIGH (ref 0.0–100.0)

## 2023-06-04 LAB — TROPONIN I (HIGH SENSITIVITY): Troponin I (High Sensitivity): 7 ng/L (ref <18)

## 2023-06-04 LAB — LIPASE, BLOOD: Lipase: 37 U/L (ref 11–51)

## 2023-06-04 MED ORDER — FUROSEMIDE 20 MG PO TABS
10.0000 mg | ORAL_TABLET | Freq: Every day | ORAL | 0 refills | Status: DC
Start: 2023-06-04 — End: 2023-10-26

## 2023-06-04 MED ORDER — FUROSEMIDE 20 MG PO TABS
10.0000 mg | ORAL_TABLET | Freq: Once | ORAL | Status: AC
  Administered 2023-06-04: 10 mg via ORAL
  Filled 2023-06-04: qty 0.5

## 2023-06-04 MED ORDER — SODIUM CHLORIDE 0.9 % IV BOLUS
500.0000 mL | Freq: Once | INTRAVENOUS | Status: AC
  Administered 2023-06-04: 500 mL via INTRAVENOUS

## 2023-06-04 NOTE — ED Provider Notes (Signed)
-----------------------------------------   8:09 PM on 06/04/2023 -----------------------------------------  Blood pressure 109/72, pulse 89, temperature 97.9 F (36.6 C), temperature source Oral, resp. rate 18, height 5\' 11"  (1.803 m), weight 102.5 kg, SpO2 93%.  Assuming care from [employer], PA-C.  In short, Ian Gill is a 78 y.o. male with a chief complaint of Fall .  Refer to the original H&P for additional details.  The current plan of care is to await CMP. If reassuring patient will be able to be discharged with orthopedic follow up.    ----------------------------------------- 8:23 PM on 06/04/2023 -----------------------------------------  CMP reassuring.  Patient is stable condition for discharge home.       Phyllis Breeze, Dodger Sinning A, PA-C 06/04/23 2024    Marylynn Soho, MD 06/04/23 [phone]

## 2023-06-04 NOTE — ED Triage Notes (Signed)
 Patient states he slipped on the floor at nursing facility and now having pain to left ankle.

## 2023-06-04 NOTE — ED Notes (Signed)
 This NT had got

## 2023-06-04 NOTE — ED Provider Notes (Signed)
 [facility] Provider Note    Event Date/Time   First MD Initiated Contact with Patient 06/04/23 1655     (approximate)   History   Fall   HPI  Ian Gill is a 78 y.o. male with history of seizures, stroke, Parkinson's presents emergency department from [facility] by EMS for fall.  The son states that staff told him he has had severe diarrhea today.  Received a vaccine yesterday.  No recent antibiotics.  Complaining of weakness.  When he fell he hurt his ankle.  No head injury.  No blood thinners.      Physical Exam   Triage Vital Signs: ED Triage Vitals [06/04/23 1555]  Encounter Vitals Group     BP 109/72     Systolic BP Percentile      Diastolic BP Percentile      Pulse Rate 89     Resp 18     Temp 97.9 F (36.6 C)     Temp Source Oral     SpO2 93 %     Weight      Height      Head Circumference      Peak Flow      Pain Score 3     Pain Loc      Pain Education      Exclude from Growth Chart     Most recent vital signs: Vitals:   06/04/23 1555  BP: 109/72  Pulse: 89  Resp: 18  Temp: 97.9 F (36.6 C)  SpO2: 93%     General: Awake, no distress.  Patient appears to be very pale, slow to answer CV:  Good peripheral perfusion.  Resp:  Normal effort.  Abd:  No distention.   Other:  Left ankle tender swollen, neurovascular intact   ED Results / Procedures / Treatments   Labs (all labs ordered are listed, but only abnormal results are displayed) Labs Reviewed  CBC WITH DIFFERENTIAL/PLATELET - Abnormal; Notable for the following components:      Result Value   RBC 4.11 (*)    Platelets 144 (*)    All other components within normal limits  URINALYSIS, ROUTINE W REFLEX MICROSCOPIC - Abnormal; Notable for the following components:   Color, Urine YELLOW (*)    APPearance CLEAR (*)    Ketones, ur 5 (*)    All other components within normal limits  BRAIN NATRIURETIC PEPTIDE - Abnormal; Notable for the following  components:   B Natriuretic Peptide 172.4 (*)    All other components within normal limits  COMPREHENSIVE METABOLIC PANEL WITH GFR  LIPASE, BLOOD  TROPONIN I (HIGH SENSITIVITY)     EKG  EKG   RADIOLOGY Chest x-ray, x-ray left ankle    PROCEDURES:   Procedures Chief Complaint  Patient presents with   Fall      MEDICATIONS ORDERED IN ED: Medications  furosemide (LASIX) tablet 10 mg (has no administration in time range)  sodium chloride  0.9 % bolus 500 mL (0 mLs Intravenous Stopped 06/04/23 1834)     IMPRESSION / MDM / ASSESSMENT AND PLAN / ED COURSE  I reviewed the triage vital signs and the nursing notes.                              Differential diagnosis includes, but is not limited to, fracture, sprain, contusion, weakness, dehydration, metabolic abnormality, MI,  Patient's presentation is most  consistent with acute illness / injury with system symptoms.   X-ray of the left ankle was independently reviewed interpreted by me as having a lateral malleolus fracture.  Conferment radiology  Will place patient in a cam boot.  Due to the weakness I did order labs, EKG, chest x-ray.  EKG showed normal sinus rhythm, reassuring, see physician right  Chest x-ray showed mild vascular congestion, independently reviewed interpreted by me.  Due to the mild vascular congestion and BNP of 176 only give him 10 of Lasix p.o. here in the ED.  Urinalysis, troponin, CBC are all reassuring  Care is being transferred to [facility], PA-C at shift change.  She is going to evaluate the comprehensive metabolic panel and lipase.  If normal he will be discharged to home.  Follow-up for the ankle fracture with orthopedics.  2 Lasix pills sent to the pharmacy.  They are to give him half a pill once daily for the next 4 days as needed.  Return emergency department if worsening.      FINAL CLINICAL IMPRESSION(S) / ED DIAGNOSES   Final diagnoses:  Closed fracture of left ankle,  initial encounter     Rx / DC Orders   ED Discharge Orders          Ordered    furosemide (LASIX) 20 MG tablet  Daily        06/04/23 2000             Note:  This document was prepared using Dragon voice recognition software and may include unintentional dictation errors.    Delsie Figures, PA-C 06/04/23 Marton Sleeper, MD 06/04/23 2005

## 2023-06-04 NOTE — ED Notes (Signed)
 This NT had got notice from RN Bridgette Campus that PT had urinated on the floor as well on PT. This NT brought PT some disposable pants and wipes. This NT cleaned up the urine that was on the floor. This NT then offered PT the pants. PT denied and stated " his pants will air dry". PT was then sitting in chair beside bedside. This NT got orders to do an EKG on PT when then this NT noticed that PT was on the floor on his rear bottom. This NT then got some help from nearby Nurse to get PT off the floor.  With the help of staff PT got in the bed, This NT along with several other nurses asked the PT if they were hurt or injured. PT answered "No'. Pt was then given a Fall Risk bracelet and some slip resistant socks. This NT continued with EKG, PT did not complain of any pain during EKG. PT is now resting in bed watching TV.

## 2023-06-04 NOTE — Discharge Instructions (Addendum)
 Called orthopedics for a follow-up appointment. Wear the boot when walking Your chest x-ray showed some vascular congestion and your lab value for CHF was minimally elevated.  Will give you oral Lasix for a few days.  Follow-up with your doctor.

## 2023-06-04 NOTE — ED Notes (Signed)
 The pt had been sitting on the bench seat using the urinal. The pt was noted to have wet himself and was informed not to move that he would be receiving paper scrubs due to wetting his pants.  The CNA was given the pants and asked to assist the pt into changing into them. She stated that she would and walked into the room.   This paramedic was next door prepping the pt for surgery when she was informed that the pt had fallen. The pt was found on the floor near the bench seat. The pt was assisted to the bed. Fall bundle precautions taken at this time.  The pt states he was trying to reach the call light on the bed when he fell. The pt denies hitting his head having any LOC before,during or after the fall. The pt further denies any pain or injury at this time. No obvious injuries noted.  Charge nurse informed of same and post fall assessment, bundle and safety zone completed at this time.

## 2023-06-04 NOTE — ED Notes (Signed)
 First nurse note-pt brought in via ems from [facility].  Pt reportedly fell last night and did not tell staff until this morning.  Pt has left ankle pain.  DNR with pt.  Pt has unsteady gait with recent falls per ems.   Pt in wheelchair in lobby.   BP 145/70 P 97 o2 sats 92%

## 2023-06-04 NOTE — ED Notes (Signed)
 Called to [employer] Per RN Bridgette Campus @939PM Ellen Guppy to [facility] [employer].

## 2023-06-05 ENCOUNTER — Emergency Department

## 2023-06-05 ENCOUNTER — Other Ambulatory Visit: Payer: Self-pay

## 2023-06-05 ENCOUNTER — Emergency Department
Admission: EM | Admit: 2023-06-05 | Discharge: 2023-06-05 | Disposition: A | Discharge status: Home / Self Care | Attending: Emergency Medicine | Admitting: Emergency Medicine

## 2023-06-05 ENCOUNTER — Encounter: Payer: Self-pay | Admitting: Emergency Medicine

## 2023-06-05 DIAGNOSIS — J449 Chronic obstructive pulmonary disease, unspecified: Secondary | ICD-10-CM | POA: Diagnosis not present

## 2023-06-05 DIAGNOSIS — W06XXXA Fall from bed, initial encounter: Secondary | ICD-10-CM | POA: Insufficient documentation

## 2023-06-05 DIAGNOSIS — Z8673 Personal history of transient ischemic attack (TIA), and cerebral infarction without residual deficits: Secondary | ICD-10-CM | POA: Insufficient documentation

## 2023-06-05 DIAGNOSIS — G20A1 Parkinson's disease without dyskinesia, without mention of fluctuations: Secondary | ICD-10-CM | POA: Diagnosis not present

## 2023-06-05 DIAGNOSIS — S0990XA Unspecified injury of head, initial encounter: Secondary | ICD-10-CM

## 2023-06-05 DIAGNOSIS — S0001XA Abrasion of scalp, initial encounter: Secondary | ICD-10-CM | POA: Diagnosis not present

## 2023-06-05 DIAGNOSIS — Y92009 Unspecified place in unspecified non-institutional (private) residence as the place of occurrence of the external cause: Secondary | ICD-10-CM

## 2023-06-05 DIAGNOSIS — S40022A Contusion of left upper arm, initial encounter: Secondary | ICD-10-CM | POA: Insufficient documentation

## 2023-06-05 DIAGNOSIS — Y92003 Bedroom of unspecified non-institutional (private) residence as the place of occurrence of the external cause: Secondary | ICD-10-CM | POA: Insufficient documentation

## 2023-06-05 MED ORDER — BACITRACIN ZINC 500 UNIT/GM EX OINT
TOPICAL_OINTMENT | Freq: Once | CUTANEOUS | Status: AC
  Filled 2023-06-05: qty 0.9

## 2023-06-05 NOTE — ED Triage Notes (Signed)
 Pt arrives via ems from the [facility]. Pt was seen in the Er yesterday for a fall. Today pt was sitting up in the bed and leaved forward causing him to fall out of the bed, pt has a bandage to the top of his head placed by nursing home staff, pt has a walking boot to the left foot from a fx diagnosis yesterday. Pt has what appears to be old bruising to the left bicep that is green, yellow, and purple with swelling, pt has a pos radial pulse to the left wrist. Pt denies pain or loc

## 2023-06-05 NOTE — Discharge Instructions (Addendum)
 Your exam and CT scan are normal and reassuring following your fall. There is no evidence of a serious head injury. Take your home meds. Keep the abrasion on your scalp clean and dry.

## 2023-06-05 NOTE — ED Provider Notes (Signed)
 [facility] Emergency Department Provider Note     Event Date/Time   First MD Initiated Contact with Patient 06/05/23 [phone]     (approximate)   History   Fall and Head Injury   HPI  Ian Gill is a 78 y.o. male with a history of seizures, anxiety, stroke, COPD, Parkinson's disorder, presents to the ED via EMS from his care facility.  Patient was evaluated in the ED yesterday for mechanical fall.  He presents again today, noted a mechanical fall when he was sitting on the edge of the bed.  By his report, he leaned forward, causing fall urgent bed and sustained an abrasion to the top of his head.  Patient has a cam boot in place on the left foot due to a ankle fracture diagnosed and treated yesterday.  No reports of any LOC.  Patient denies any acute pain at this time.  Physical Exam   Triage Vital Signs: ED Triage Vitals  Encounter Vitals Group     BP 06/05/23 0911 (!) 152/82     Systolic BP Percentile --      Diastolic BP Percentile --      Pulse Rate 06/05/23 0911 96     Resp 06/05/23 0911 17     Temp 06/05/23 0911 98.4 F (36.9 C)     Temp Source 06/05/23 0911 Oral     SpO2 06/05/23 0911 97 %     Weight 06/05/23 0916 215 lb (97.5 kg)     Height 06/05/23 0916 5\' 11"  (1.803 m)     Head Circumference --      Peak Flow --      Pain Score 06/05/23 0916 0     Pain Loc --      Pain Education --      Exclude from Growth Chart --     Most recent vital signs: Vitals:   06/05/23 0911 06/05/23 1402  BP: (!) 152/82 (!) 136/90  Pulse: 96 94  Resp: 17 17  Temp: 98.4 F (36.9 C) 97.9 F (36.6 C)  SpO2: 97% 96%    General Awake, no distress. NAD HEENT NCAT, except for small abrasion to the top of the scalp. PERRL. EOMI. No rhinorrhea. Mucous membranes are moist.  CV:  Good peripheral perfusion.  RRR RESP:  Normal effort.  CTA ABD:  No distention.  Soft and nontender MSK:  Active range of motion of all extremities.  Left upper extremity with  the healing ecchymosis.  No evidence of shoulder dislocation or arthropathy.   ED Results / Procedures / Treatments   Labs (all labs ordered are listed, but only abnormal results are displayed) Labs Reviewed - No data to display   EKG   RADIOLOGY  I personally viewed and evaluated these images as part of my medical decision making, as well as reviewing the written report by the radiologist.  ED Provider Interpretation: no acute findings  No results found.   CT Head w/o CM  IMPRESSION: No acute or traumatic finding. Age related volume loss. Atherosclerotic calcification of the major vessels at the base of the brain.  PROCEDURES:  Critical Care performed: No  Procedures   MEDICATIONS ORDERED IN ED: Medications  bacitracin ointment ( Topical Given 06/05/23 1403)     IMPRESSION / MDM / ASSESSMENT AND PLAN / ED COURSE  I reviewed the triage vital signs and the nursing notes.  Differential diagnosis includes, but is not limited to, SDH, closed head injury, concussion, skull fracture, abrasion, laceration, contusion, myalgias  Patient's presentation is most consistent with acute presentation with potential threat to life or bodily function.  Patient's diagnosis is consistent with mechanical fall resulting in minor head injury without LOC.  Geriatric patient presents in no acute distress from home via EMS.  Exam is reassuring.  Patient is at baseline according to staff and family numbers at bedside.  CT scan without evidence of any acute intrathoracic process.  Patient will be discharged home with instructions to take his home meds. Patient is to follow up with his primary provider as discussed, as needed or otherwise directed. Patient is given ED precautions to return to the ED for any worsening or new symptoms.     FINAL CLINICAL IMPRESSION(S) / ED DIAGNOSES   Final diagnoses:  Fall in home, initial encounter  Minor head injury, initial  encounter     Rx / DC Orders   ED Discharge Orders     None        Note:  This document was prepared using Dragon voice recognition software and may include unintentional dictation errors.    May Sparks, PA-C 06/09/23 1520    Lind Repine, MD 06/10/23 [phone]

## 2023-06-05 NOTE — Progress Notes (Signed)
 PT Cancellation Note  Patient Details Name: Ian Gill MRN: [id number] DOB: Jun 26, 1945   Cancelled Treatment:    Reason Eval/Treat Not Completed: Other (comment). Orders received and chart reviewed. Per NSG, pt is transitioning from ALF to SNF prior to admission. Current RN stating PA does not find it necessary to progress with PT order in order for safe D/c from admission. Told over the phone to discontinue orders and sign off. PT to sign off in house.    Marc Senior. Fairly IV, PT, DPT Physical Therapist- [place]  [facility]  06/05/2023, 2:19 PM

## 2023-09-08 NOTE — Progress Notes (Signed)
 Ref Provider: Dr. Hande, Vishwanath Handa*  PCP: Dr. Lenon, Layman Blush III, MD    Assessment and Plan:   In most patients we give written parts of assessment and plan to patient under Patient Instructions/After Visit Summary. So some parts are directed to patient.  Dear Mr. Rafiel Mecca, It was our pleasure to participate in your care. We have typed up brief summary of what we discussed. 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 Worsening symptoms with frequent falls, balance issues, and tremors. Currently on carbidopa -levodopa  25/100 mg, three pills three times a day, totaling 900 mg daily. Symptoms include bilateral resting and action tremors, low volume speech, generalized bradykinesia, and bilateral cogwheeling, more pronounced on the right. Mild improvement with medication. Concerns about increasing carbidopa -levodopa  dose due to potential hallucinations, especially with existing visual issues. Tremors are well controlled with current medication regimen. Action tremors are more problematic than resting tremors, particularly during eating.  - Continue carbidopa -levodopa  25/100 mg, three pills three times a day. - Consider weighted sleeves and weighted utensils to assist with action tremors.  Weighted sleeves are used to help dampen out your tremors. [employer] has a good selection of weighted sleeves at affordable prices.    - Educate on the use of Liftware utensils for stabilization during eating. Tremors can affect ability to use utensils while eating. Liftware is a new type of spoon which adapts to your hand tremor by automatically stabilizing tip of the spoon/fork. Www.liftware.com  Phone:[phone] Y8939335 - Schedule follow-up in 8-9 months, with the  option to call if symptoms worsen. 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 Prisms have been recommended but are ineffective. Visual impairment contributes to balance issues and falls. Explained the anatomical basis of visual impairment due to occipital cortical atrophy, affecting the brain's ability to interpret visual information. Lightheadedness upon position change noted, likely due to orthostatic hypotension, exacerbating balance issues.  - Encourage physical activity to improve balance and overall health.  We encouraged patient to stay physically active and exercise on regular basis. Exercise can be very beneficial in many neurological conditions. - Recommend using water bottles and stretch bands for safe exercise at home. - Suggest engaging in enjoyable activities like dancing or rhythmic movements to stimulate the brain.  We recommend a diet consisting of a variety of green leafy vegetables, fruits, nuts and other whole grain food. Avoid ultra-processed food, soda and artifical sweeteners.   Some life style modifications can help prevent and delay progression of dementia: maintaining a whole food plant based diet, being physically active, getting 7-8 hours of sleep a night, managing stress, engaging in to challenging mental activities, and being socially active. Learn more at https://www.morris-vasquez.com/, LimitLaws.hu and www.mybraindoctor.com  CONSIDERED COMORBIDITIES BELOW Cognitive impairment - concerning for underlying neurodegenerative process such as dementia   History of seizures in a patient with a history of alcoholism - most recent seizure was 09/10/2019 likely in the setting of missed medication as drug levels were sub  therapeutic, patient had a fall and hit head, presented to ER 07/08/20, negative stroke workup. 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  Obstructive sleep apnea - using CPAP  Thiamine  Deficiency  Follow up in 8-9 months with Kaitlin Paich, PA-C  Return in about 9 months (around 06/07/2024) for with Kaitlin Paich PA-C.  This note has been created using automated tools and reviewed for accuracy by [facility]. Interim History date 09/08/2023   Mr. Berntsen is a 78 y.o. male here for treatment and evaluation of Parkinson's Disease  Mr. Shell last visit was on 03/26/2023  Patient states his Parkinson's disease symptoms are worse.  Worsening tremors and balance.  Has a fall nearly everyday per patient.  Taking sinemet  25/100 mg 3 pills three times a day.    History of Present Illness Abdelrahman Nair is a 78 year old male with possible parkinsonism and posterior cortical atrophy who presents with worsening balance and vision issues. He is accompanied by [name], his son.  Gait instability and falls - Worsening balance with falls occurring almost daily - Describes balance as 'terrible' - Able to get up independently after falls; not all falls are reported to nursing staff - Falls occur in various situations, often due to loss of muscle tone and control, particularly when leaning forward or backward - Physical activity limited to walking approximately twenty yards to the common area for meals three times daily - Desires to use the gym but requires supervision for safety - Receives regular physical therapy for balance, but frequency varies and he believes sessions may not fully address his needs  Visual disturbances - Worsening vision with double vision and poor depth perception - Poor contrast sensitivity - Prisms recommended but found to be ineffective - Diagnosed with significant posterior cortical  atrophy  Orthostatic symptoms - Lightheadedness, particularly with positional changes - Believes lightheadedness contributes to balance issues  Tremor - Action tremors, particularly when eating  Seizure disorder - History of seizures - Most recent seizure in August 2021, attributed to missed medication dose  Sleep-disordered breathing - Obstructive sleep apnea - Uses BiPAP  Substance use and nutritional deficiency - History of alcohol use disorder; not currently drinking - History of thiamine  deficiency  Medication regimen - Currently taking carbidopa /levodopa  25/100 mg, three tablets three times daily (total 900 mg levodopa  daily)  I reviewed labs, imaging, and notes in [employer], [facility], and from outside providers, if available.   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  Rest and Action tremors.  Tremors worsen with certain tasks such as writing, applying his toothpaste to toothbrush, and shooting.  Tremors came on gradually and have been worsening.    Increased balance problems and falls, now requiring a rollator. He states he is slower than he once was.    He was started on sinemet  and this helped some.  He states rest makes his tremors better.  He does not drink any caffeinated beverages.  He does not know if alcohol makes his tremors better (no longer drinking).  He denies any history of head trauma.  No family history of tremors or Parkinson's disease.  He denied unilateral resting tremors, decreased volume of speech, drooling, feeling of stiffness, constipation, vivid dreams at night or acting out of the  dreams  etc.  Vision issues likely due to posterior cortical atrophy.   Medications: Primidone (effective - patient discontinued as he thought it was a Parkinson's disease drug), Sinemet  (good for resting tremor, ineffective to action tremors), Depakote  (improved after stopping), propranolol  Non-medication Therapy: Physical Therapy   History of seizures in a patient with a history of alcoholism - most recent seizure was 09/10/2019 likely in the setting of missed medication as drug levels were sub therapeutic - Depakote   (11 when checked 09/08/2019) and Keppra  (5 when checked 09/08/2019) - patient had a recent fall and hit head, presented to ER 07/08/20, negative stroke workup, unsure if was due to +ve covid at the time, or hypoxia, or actual seizure. Patient has many generalized parkinsonism features: Patient has seizures.  He states his most recent seizure was around august 2017.  Takes Keppra  1500 mg two times a day and Depakote  750 mg in the morning and 1000 mg nightly.  Been followed by Dr. Deward Endo and Dr. Glennon Sparrow with [facility].  Patient had seizures as a child, these improved around age 59, then worsened later in life.   Patient states he has a history of stroke.  No records of this available. Patient also reports blurry vision following a seizure in 08/2019.   Historical concern for medication side effects due to patient's diplopia, imbalance, and tremors: diplopia and imbalance improved with discontinuation of Depakote  + reduction of Keppra . No breakthrough seizures   MRI Brain without contrast (07/06/20): No evidence of acute intracranial abnormality. No large vessel occlusion or visible hemodynamically significant stenosis.  MRA Neck and Head with and without contrast (07/06/20): No evidence of acute intracranial abnormality. No large vessel occlusion or visible hemodynamically significant stenosis.  CT head (05/30/19): No evidence of acute intracranial abnormality. Stable, mild  generalized parenchymal atrophy. Mild ethmoid and sphenoid sinus mucosal thickening at the imaged levels. CT Head  (03/07/14) and (07/11/15): Normal. MRI Brain (01/06/13): Normal.  72 hour EEG (07/25/16): This 72 hour ambulatory EEG in the awake and sleep states is abnormal due to presence of frequent to very frequent generalized spike, polyspike and waves discharges were noted, lasting up to 1-2 seconds. Rare prolonged electrographic epileptiform discharges were noted lasting 4-5 sec.  EEG (02/2014): Showed generalized 2-3 Hz spike and wave activity intermittently throughout the recording during wakefulness and sleep. No electrographic seizures were captured.   Medications: Keppra , Lamotrigine , Depakote  (stopped due to tremors, restarted)  Depression and anxiety: He is not interested in seeing she psychiatrist because he thinks they are not helping with his symptoms. He did report that sometimes he feels like he would be better off dead but denies suicide ideation. Medications: Zoloft   Cognitive impairment - concerning for underlying neurodegenerative process such as dementia: Medications: Aricept   History of alcohol use: Patient received Thiamine  (Vitamin B1) injections, has stopped drinking as of 02/2021 due to living in skilled nursing facility (moved there 1.5-2 years ago).    Imbalance with frequent falls: Patient had recent fall and presented to Emergency Room on 07/08/20. Patient states he fell in July 2021 and broke his right foot. Receiving home health/physical therapy with skilled nursing facility.  History of Covid in June 2022:  REM behavior disorder: Discontinued melatonin as it wasn't needed.   Obstructive sleep apnea - using CPAP: He does use his CPAP nightly.  His pressure setting is 11cmH20. Scheduled for repeat CPAP titration in 2018 which did not occur.   HST in 08/2015:  Overall AHI 37, Min  SpO2: 84.6%, RDI 49  Bladder incontinence: Sent a urology consult [facility]) at 02/2021  visit.  Cluster B personality Disorder  Chronic Diplopia, with stereopsis problems- patient was tried for prisms, with little improvement.     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 Prisms have been recommended but are ineffective. Visual impairment contributes to balance issues and falls. Explained the anatomical basis of visual impairment due to occipital cortical atrophy, affecting the brain's ability to interpret visual information. Lightheadedness upon position change noted, likely due to orthostatic hypotension, exacerbating balance issues.  Physical Exam   Vitals Vitals:   09/08/23 1528  BP: 124/76  Pulse: 101  Weight: 98 kg (216 lb)  Height: 177.8 cm (5' 10)  PainSc: 0-No pain     Body mass index is 30.99 kg/m.  Physical Exam   (Some of the exam changes noted are from previous clinical observations)  General Exam  Neurological Exam  02/2021 Unsteady  Slightly hunched forward  (11/15/2019) Memory Testing Date recall  Correctly recalled month and year Correctly recalled day of the week  Correctly recalled home address including city and zip code  Correctly recalled how many children and grandchildren he has  Immediate recall (registration - computer, sky and Butterfly) 2/3 and delayed recall 2/3.  Mr. Berns was asked to count months of the year backward - Patient was able to do it without any problems.  Patient was asked to name different words starting with letter K and he was able to come up with 2 words correctly. The patient was asked to count different types of animals in 1 minute. The patient counted 13  distinct animals.  Mild postural and resting tremors of hands. Patient in wheelchair   Medications: Current Outpatient Medications on File Prior to Visit  Medication Sig  Dispense Refill  . amLODIPine  (NORVASC ) 10 MG tablet Take 1 tablet (10 mg total) by mouth once daily for 90 days 90 tablet 0  . atorvastatin  (LIPITOR) 20 MG tablet Take 1 tablet (20 mg total) by mouth once daily 90 tablet 1  . carbidopa -levodopa  (SINEMET ) 25-100 mg tablet Take 3 tablets by mouth 3 (three) times daily 810 tablet 3  . cholecalciferol  (VITAMIN D3) 5,000 unit capsule Take 5,000 Units by mouth once daily       . clopidogreL  (PLAVIX ) 75 mg tablet Take 1 tablet (75 mg total) by mouth once daily 90 tablet 1  . donepeziL  (ARICEPT ) 5 MG tablet Take 1 tablet (5 mg total) by mouth at bedtime for 360 days 30 tablet 11  . folic acid  (FOLVITE ) 1 MG tablet Take 1 mg by mouth once daily    . ibuprofen (MOTRIN) 200 MG tablet Take 200 mg by mouth every 8 (eight) hours as needed for Pain    . lamoTRIgine  (LAMICTAL ) 150 MG tablet Take 1 tablet (150 mg total) by mouth 2 (two) times daily 60 tablet 11  . levETIRAcetam  (KEPPRA ) 750 MG tablet Take 1 tablet (750 mg total) by mouth 2 (two) times daily 60 tablet 11  . lisinopriL  (ZESTRIL ) 20 MG tablet Take 1 tablet (20 mg total) by mouth once daily for 90 days 90 tablet 0  . mirabegron  (MYRBETRIQ ) 50 mg ER tablet Take 50 mg by mouth once daily    . multivitamin-minerals-FA-lutein (CEROVITE SENIOR) 0.4 mg-300 mcg- 250 mcg tablet Take by mouth    . omega 3-dha-epa-fish oil (FISH  OIL) 100-160-1,000 mg Cap Take by mouth    . propranoloL  (INDERAL ) 10 MG tablet Take 1 tablet (10 mg total) by mouth 2 (two) times daily 60 tablet 11  . sertraline  (ZOLOFT ) 100 MG tablet Take 2 tablets (200 mg total) by mouth once daily for 360 days 180 tablet 3  . silodosin  (RAPAFLO ) 8 mg capsule Take 4 mg by mouth once daily  Take 1 capsule (4 mg total) by mouth daily with breakfast.    . traZODone  (DESYREL ) 50 MG tablet Take 50 mg by mouth at bedtime    . multivitamin-lutein (CENTRUM SILVER) tablet Take 1 tablet by mouth     No current facility-administered medications on file  prior to visit.   Past Medical History:  Past Medical History:  Diagnosis Date  . 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 Anemia 2015  . Arrhythmia 2009   Wandering atrial pacemaker - benign cond.  . Carpal tunnel syndrome, left 08/30/2010  . Cerebral hemorrhage (CMS/HHS-HCC) 2015  . COPD (chronic obstructive pulmonary disease) (CMS/HHS-HCC)   . Depression   . Encounter for therapeutic drug level monitoring 12/17/2012  . GERD (gastroesophageal reflux disease) 2015  . H/O electroencephalogram with 2-3 Hz generalized spike and wave 03/22/2014 03/22/2014  . History of MRI of brain and brain stem w/ and w/o contrast @ [facility] 01/06/2013 normal seizure protocol study 01/06/2013  . Hyperlipidemia 08/29/2008  . Hypertension   . Issue of repeat prescription for medication 12/17/2012  . OSA (obstructive sleep apnea) DME: [facility] 11/28/2013  . Parkinson disease (CMS/HHS-HCC)   . Seizure disorder (CMS/HHS-HCC) 12/17/2012   12/17/2012 On Dilantin  600 mg (3 am and 3 pm) and Keppra  500 mg PO BID  . Seizures (CMS/HHS-HCC)   . Sleep apnea   . Stroke (CMS/HHS-HCC) 2015  . Tremor     Past Surgical History:  Past Surgical History:  Procedure Laterality Date  . knee surgery  2014  . JOINT REPLACEMENT  2014, 2012   Lt knee, rt shoulder  . prostate surgery    . SPINE SURGERY    . TOTAL SHOULDER REPLACEMENT     Family History:  Family History  Problem Relation Name Age of Onset  . Cancer Mother    . Heart disease Father    . High blood pressure (Hypertension) Father    . Myocardial Infarction (Heart attack) Father    . Deep vein thrombosis (DVT or abnormal blood clot formation) Father    . Arthritis Sister    . Sleep disorder Son     Social History:  Social History   Socioeconomic History  . Marital status: Divorced  Tobacco Use  . Smoking status: Former    Current packs/day: 0.00    Average packs/day: 1 pack/day for 8.0 years (8.0 ttl pk-yrs)    Types: Cigarettes    Start  date: 02/11/1960    Quit date: 02/11/1968    Years since quitting: 55.6  . Smokeless tobacco: Never  Vaping Use  . Vaping status: Never Used  Substance and Sexual Activity  . Alcohol use: Yes    Alcohol/week: 0.0 standard drinks of alcohol  . Drug use: No  . Sexual activity: Not Currently   Social Drivers of Health   Financial Resource Strain: Medium Risk (06/10/2023)   Overall Financial Resource Strain (CARDIA)   . Difficulty of Paying Living Expenses: Somewhat hard  Food Insecurity: No Food Insecurity (06/10/2023)   Hunger Vital Sign   . Worried About Programme researcher, broadcasting/film/video in  the Last Year: Never true   . Ran Out of Food in the Last Year: Never true  Transportation Needs: No Transportation Needs (06/10/2023)   PRAPARE - Transportation   . Lack of Transportation (Medical): No   . Lack of Transportation (Non-Medical): No  Housing Stability: Low Risk  (06/10/2023)   Housing Stability Vital Sign   . Unable to Pay for Housing in the Last Year: No   . Number of Times Moved in the Last Year: 0   . Homeless in the Last Year: No   Allergies:  Allergies  Allergen Reactions  . Bupropion Hives    Seizures  Seizure seizures Seizure  . Bupropion Hcl Other (See Comments) and Unknown    seizures Seizure    . Other Swelling  . Phenytoin  Sodium Extended Other (See Comments)    Past issues with toxicity per pt's son Past issues with toxicity per pt's son  . Iodinated Contrast Media Swelling   Dr. Jannett Fairly

## 2023-10-21 ENCOUNTER — Inpatient Hospital Stay

## 2023-10-21 ENCOUNTER — Emergency Department

## 2023-10-21 ENCOUNTER — Encounter: Payer: Self-pay | Admitting: Emergency Medicine

## 2023-10-21 ENCOUNTER — Inpatient Hospital Stay
Admission: EM | Admit: 2023-10-21 | Discharge: 2023-10-26 | DRG: 871 | Disposition: A | Discharge status: Skilled Nursing Facility | Source: Skilled Nursing Facility | Attending: Obstetrics and Gynecology | Admitting: Obstetrics and Gynecology

## 2023-10-21 ENCOUNTER — Other Ambulatory Visit: Payer: Self-pay

## 2023-10-21 DIAGNOSIS — G4733 Obstructive sleep apnea (adult) (pediatric): Secondary | ICD-10-CM | POA: Diagnosis present

## 2023-10-21 DIAGNOSIS — E785 Hyperlipidemia, unspecified: Secondary | ICD-10-CM | POA: Diagnosis present

## 2023-10-21 DIAGNOSIS — Z66 Do not resuscitate: Secondary | ICD-10-CM | POA: Diagnosis present

## 2023-10-21 DIAGNOSIS — N400 Enlarged prostate without lower urinary tract symptoms: Secondary | ICD-10-CM | POA: Diagnosis present

## 2023-10-21 DIAGNOSIS — J189 Pneumonia, unspecified organism: Secondary | ICD-10-CM | POA: Diagnosis present

## 2023-10-21 DIAGNOSIS — G9341 Metabolic encephalopathy: Secondary | ICD-10-CM | POA: Diagnosis present

## 2023-10-21 DIAGNOSIS — I10 Essential (primary) hypertension: Secondary | ICD-10-CM | POA: Diagnosis present

## 2023-10-21 DIAGNOSIS — G40909 Epilepsy, unspecified, not intractable, without status epilepticus: Secondary | ICD-10-CM | POA: Diagnosis present

## 2023-10-21 DIAGNOSIS — J449 Chronic obstructive pulmonary disease, unspecified: Secondary | ICD-10-CM | POA: Diagnosis present

## 2023-10-21 DIAGNOSIS — J9601 Acute respiratory failure with hypoxia: Secondary | ICD-10-CM | POA: Diagnosis present

## 2023-10-21 DIAGNOSIS — F0280 Dementia in other diseases classified elsewhere without behavioral disturbance: Secondary | ICD-10-CM | POA: Diagnosis present

## 2023-10-21 DIAGNOSIS — Z981 Arthrodesis status: Secondary | ICD-10-CM

## 2023-10-21 DIAGNOSIS — Z8673 Personal history of transient ischemic attack (TIA), and cerebral infarction without residual deficits: Secondary | ICD-10-CM

## 2023-10-21 DIAGNOSIS — Z7902 Long term (current) use of antithrombotics/antiplatelets: Secondary | ICD-10-CM

## 2023-10-21 DIAGNOSIS — Z96659 Presence of unspecified artificial knee joint: Secondary | ICD-10-CM | POA: Diagnosis present

## 2023-10-21 DIAGNOSIS — J440 Chronic obstructive pulmonary disease with acute lower respiratory infection: Secondary | ICD-10-CM | POA: Diagnosis present

## 2023-10-21 DIAGNOSIS — K219 Gastro-esophageal reflux disease without esophagitis: Secondary | ICD-10-CM | POA: Diagnosis present

## 2023-10-21 DIAGNOSIS — F1020 Alcohol dependence, uncomplicated: Secondary | ICD-10-CM | POA: Diagnosis present

## 2023-10-21 DIAGNOSIS — Z87891 Personal history of nicotine dependence: Secondary | ICD-10-CM | POA: Diagnosis not present

## 2023-10-21 DIAGNOSIS — J690 Pneumonitis due to inhalation of food and vomit: Secondary | ICD-10-CM | POA: Diagnosis present

## 2023-10-21 DIAGNOSIS — Z1152 Encounter for screening for COVID-19: Secondary | ICD-10-CM

## 2023-10-21 DIAGNOSIS — R1310 Dysphagia, unspecified: Secondary | ICD-10-CM | POA: Diagnosis present

## 2023-10-21 DIAGNOSIS — R569 Unspecified convulsions: Secondary | ICD-10-CM

## 2023-10-21 DIAGNOSIS — A419 Sepsis, unspecified organism: Principal | ICD-10-CM | POA: Diagnosis present

## 2023-10-21 DIAGNOSIS — Z91041 Radiographic dye allergy status: Secondary | ICD-10-CM

## 2023-10-21 DIAGNOSIS — Z79899 Other long term (current) drug therapy: Secondary | ICD-10-CM

## 2023-10-21 DIAGNOSIS — G20A1 Parkinson's disease without dyskinesia, without mention of fluctuations: Secondary | ICD-10-CM | POA: Diagnosis present

## 2023-10-21 DIAGNOSIS — I639 Cerebral infarction, unspecified: Secondary | ICD-10-CM | POA: Diagnosis present

## 2023-10-21 DIAGNOSIS — Z825 Family history of asthma and other chronic lower respiratory diseases: Secondary | ICD-10-CM

## 2023-10-21 LAB — LACTIC ACID, PLASMA
Lactic Acid, Venous: 2.1 mmol/L (ref 0.5–1.9)
Lactic Acid, Venous: 1.3 mmol/L (ref 0.5–1.9)

## 2023-10-21 LAB — URINALYSIS, W/ REFLEX TO CULTURE (INFECTION SUSPECTED)
Bilirubin Urine: NEGATIVE
Glucose, UA: NEGATIVE mg/dL
Hgb urine dipstick: NEGATIVE
Ketones, ur: 5 mg/dL — AB
Leukocytes,Ua: NEGATIVE
Nitrite: NEGATIVE
Protein, ur: 30 mg/dL — AB
Specific Gravity, Urine: 1.02 (ref 1.005–1.030)
pH: 5 (ref 5.0–8.0)

## 2023-10-21 LAB — CBC
HCT: 45.4 % (ref 39.0–52.0)
Hemoglobin: 15.4 g/dL (ref 13.0–17.0)
MCH: 33.1 pg (ref 26.0–34.0)
MCHC: 33.9 g/dL (ref 30.0–36.0)
MCV: 97.6 fL (ref 80.0–100.0)
Platelets: 223 K/uL (ref 150–400)
RBC: 4.65 MIL/uL (ref 4.22–5.81)
RDW: 12.2 % (ref 11.5–15.5)
WBC: 14.7 K/uL — ABNORMAL HIGH (ref 4.0–10.5)
nRBC: 0 % (ref 0.0–0.2)

## 2023-10-21 LAB — RESP PANEL BY RT-PCR (RSV, FLU A&B, COVID)  RVPGX2
Influenza A by PCR: NEGATIVE
Influenza B by PCR: NEGATIVE
Resp Syncytial Virus by PCR: NEGATIVE
SARS Coronavirus 2 by RT PCR: NEGATIVE

## 2023-10-21 LAB — COMPREHENSIVE METABOLIC PANEL WITH GFR
ALT: 7 U/L (ref 0–44)
AST: 51 U/L — ABNORMAL HIGH (ref 15–41)
Albumin: 4.4 g/dL (ref 3.5–5.0)
Alkaline Phosphatase: 67 U/L (ref 38–126)
Anion gap: 13 (ref 5–15)
BUN: 15 mg/dL (ref 8–23)
CO2: 26 mmol/L (ref 22–32)
Calcium: 9.7 mg/dL (ref 8.9–10.3)
Chloride: 103 mmol/L (ref 98–111)
Creatinine, Ser: 1.15 mg/dL (ref 0.61–1.24)
GFR, Estimated: >60 mL/min (ref >60)
Glucose, Bld: 121 mg/dL — ABNORMAL HIGH (ref 70–99)
Potassium: 3.9 mmol/L (ref 3.5–5.1)
Sodium: 142 mmol/L (ref 135–145)
Total Bilirubin: 1.1 mg/dL (ref 0.0–1.2)
Total Protein: 7.2 g/dL (ref 6.5–8.1)

## 2023-10-21 LAB — PROTIME-INR
INR: 1.1 (ref 0.8–1.2)
Prothrombin Time: 14.6 s (ref 11.4–15.2)

## 2023-10-21 MED ORDER — LISINOPRIL 20 MG PO TABS
20.0000 mg | ORAL_TABLET | Freq: Every day | ORAL | Status: DC
Start: 2023-10-22 — End: 2023-10-22
  Administered 2023-10-22: 20 mg via ORAL
  Filled 2023-10-21: qty 1

## 2023-10-21 MED ORDER — CARBIDOPA-LEVODOPA 25-100 MG PO TABS
3.0000 | ORAL_TABLET | Freq: Three times a day (TID) | ORAL | Status: DC
  Administered 2023-10-21 – 2023-10-26 (×15): 3 via ORAL
  Filled 2023-10-21 (×17): qty 3

## 2023-10-21 MED ORDER — PROPRANOLOL HCL 10 MG PO TABS
5.0000 mg | ORAL_TABLET | Freq: Two times a day (BID) | ORAL | Status: DC
Start: 2023-10-21 — End: 2023-10-26
  Administered 2023-10-21 – 2023-10-26 (×10): 5 mg via ORAL
  Filled 2023-10-21 (×12): qty 0.5

## 2023-10-21 MED ORDER — CLOPIDOGREL BISULFATE 75 MG PO TABS: 75.0000 mg | ORAL_TABLET | Freq: Every day | ORAL | Status: DC

## 2023-10-21 MED ORDER — DONEPEZIL HCL 5 MG PO TABS
5.0000 mg | ORAL_TABLET | Freq: Every day | ORAL | Status: DC
Start: 2023-10-21 — End: 2023-10-26
  Administered 2023-10-21 – 2023-10-25 (×5): 5 mg via ORAL
  Filled 2023-10-21 (×6): qty 1

## 2023-10-21 MED ORDER — SODIUM CHLORIDE 0.9 % IV SOLN
2.0000 g | INTRAVENOUS | Status: AC
  Administered 2023-10-22 – 2023-10-25 (×4): 2 g via INTRAVENOUS
  Filled 2023-10-21 (×4): qty 20

## 2023-10-21 MED ORDER — SERTRALINE HCL 50 MG PO TABS
200.0000 mg | ORAL_TABLET | Freq: Every day | ORAL | Status: DC
  Administered 2023-10-22 – 2023-10-26 (×5): 200 mg via ORAL
  Filled 2023-10-21 (×5): qty 4

## 2023-10-21 MED ORDER — TRAZODONE HCL 50 MG PO TABS
50.0000 mg | ORAL_TABLET | Freq: Every day | ORAL | Status: DC
Start: 2023-10-21 — End: 2023-10-26
  Administered 2023-10-21 – 2023-10-25 (×5): 50 mg via ORAL
  Filled 2023-10-21 (×6): qty 1

## 2023-10-21 MED ORDER — LORAZEPAM 2 MG/ML IJ SOLN
0.5000 mg | Freq: Once | INTRAMUSCULAR | Status: AC
  Administered 2023-10-21: 0.5 mg via INTRAVENOUS
  Filled 2023-10-21: qty 1

## 2023-10-21 MED ORDER — LEVETIRACETAM (KEPPRA) 500 MG/5 ML ADULT IV PUSH
1500.0000 mg | Freq: Once | INTRAVENOUS | Status: AC
  Administered 2023-10-21: 1500 mg via INTRAVENOUS
  Filled 2023-10-21: qty 15

## 2023-10-21 MED ORDER — FOLIC ACID 1 MG PO TABS
1.0000 mg | ORAL_TABLET | Freq: Every day | ORAL | Status: DC
  Administered 2023-10-22 – 2023-10-26 (×5): 1 mg via ORAL
  Filled 2023-10-21 (×5): qty 1

## 2023-10-21 MED ORDER — ONDANSETRON HCL 4 MG PO TABS: 4.0000 mg | ORAL_TABLET | Freq: Four times a day (QID) | ORAL | Status: DC | PRN

## 2023-10-21 MED ORDER — SODIUM CHLORIDE 0.9 % IV SOLN
500.0000 mg | INTRAVENOUS | Status: DC
  Administered 2023-10-22 – 2023-10-23 (×2): 500 mg via INTRAVENOUS
  Filled 2023-10-21 (×2): qty 5

## 2023-10-21 MED ORDER — LEVETIRACETAM 750 MG PO TABS
750.0000 mg | ORAL_TABLET | Freq: Two times a day (BID) | ORAL | Status: DC
Start: 2023-10-21 — End: 2023-10-26
  Administered 2023-10-21 – 2023-10-26 (×10): 750 mg via ORAL
  Filled 2023-10-21 (×10): qty 1

## 2023-10-21 MED ORDER — ENOXAPARIN SODIUM 60 MG/0.6ML IJ SOSY
50.0000 mg | PREFILLED_SYRINGE | INTRAMUSCULAR | Status: DC
  Administered 2023-10-21 – 2023-10-25 (×5): 50 mg via SUBCUTANEOUS
  Filled 2023-10-21 (×5): qty 0.6

## 2023-10-21 MED ORDER — LORAZEPAM 2 MG/ML IJ SOLN
2.0000 mg | Freq: Once | INTRAMUSCULAR | Status: AC
  Administered 2023-10-21: 2 mg via INTRAVENOUS
  Filled 2023-10-21: qty 1

## 2023-10-21 MED ORDER — LAMOTRIGINE 100 MG PO TABS
150.0000 mg | ORAL_TABLET | Freq: Two times a day (BID) | ORAL | Status: DC
  Administered 2023-10-21 – 2023-10-26 (×10): 150 mg via ORAL
  Filled 2023-10-21 (×11): qty 2

## 2023-10-21 MED ORDER — SODIUM CHLORIDE 0.9 % IV SOLN
500.0000 mg | Freq: Once | INTRAVENOUS | Status: AC
  Administered 2023-10-21: 500 mg via INTRAVENOUS
  Filled 2023-10-21: qty 5

## 2023-10-21 MED ORDER — SODIUM CHLORIDE 0.9 % IV SOLN
2.0000 g | Freq: Once | INTRAVENOUS | Status: AC
  Administered 2023-10-21: 2 g via INTRAVENOUS
  Filled 2023-10-21: qty 20

## 2023-10-21 MED ORDER — ONDANSETRON HCL 4 MG/2ML IJ SOLN: 4.0000 mg | Freq: Four times a day (QID) | INTRAMUSCULAR | Status: DC | PRN

## 2023-10-21 MED ORDER — POLYETHYLENE GLYCOL 3350 17 G PO PACK: 17.0000 g | PACK | Freq: Every day | ORAL | Status: DC | PRN

## 2023-10-21 MED ORDER — AMLODIPINE BESYLATE 10 MG PO TABS
10.0000 mg | ORAL_TABLET | Freq: Every day | ORAL | Status: DC
Start: 2023-10-22 — End: 2023-10-22
  Administered 2023-10-22: 10 mg via ORAL
  Filled 2023-10-21: qty 1

## 2023-10-21 MED ORDER — ATORVASTATIN CALCIUM 20 MG PO TABS
20.0000 mg | ORAL_TABLET | Freq: Every day | ORAL | Status: DC
Start: 2023-10-21 — End: 2023-10-26
  Administered 2023-10-21 – 2023-10-25 (×5): 20 mg via ORAL
  Filled 2023-10-21 (×6): qty 1

## 2023-10-21 MED ORDER — IPRATROPIUM-ALBUTEROL 0.5-2.5 (3) MG/3ML IN SOLN: 3.0000 mL | Freq: Four times a day (QID) | RESPIRATORY_TRACT | Status: DC | PRN

## 2023-10-21 MED ORDER — ACETAMINOPHEN 325 MG PO TABS
650.0000 mg | ORAL_TABLET | Freq: Four times a day (QID) | ORAL | Status: DC | PRN
  Administered 2023-10-22 – 2023-10-25 (×5): 650 mg via ORAL
  Filled 2023-10-21 (×6): qty 2

## 2023-10-21 MED ORDER — ACETAMINOPHEN 650 MG RE SUPP: 650.0000 mg | Freq: Four times a day (QID) | RECTAL | Status: DC | PRN

## 2023-10-21 NOTE — ED Notes (Signed)
 Patient to CT scan at this time

## 2023-10-21 NOTE — TOC CM/SW Note (Addendum)
.  Transition of Care [facility]) - Inpatient Brief Assessment   Patient Details  Name: Ian Gill MRN: [id number] Date of Birth: 1945/12/07  [facility] CM/SW Contact:    Edsel DELENA Fischer, LCSW Phone Number: 10/21/2023, 1:14 PM   Clinical Narrative:  TOC received call from [facility].  Pt is a resident in ALF and facility would like pt to go to rehab.  TOC notified MD.  Transition of Care Asessment:

## 2023-10-21 NOTE — Consult Note (Signed)
 CODE SEPSIS - PHARMACY COMMUNICATION  **Broad Spectrum Antibiotics should be administered within 1 hour of Sepsis diagnosis**  Time Code Sepsis Called/Page Received: 0926  Antibiotics Ordered: ceftriaxone , azithromycin   Time of 1st antibiotic administration: 1004  Additional action taken by pharmacy: n/a  If necessary, Name of Provider/Nurse Contacted: n/a    Lain Tetterton A Zaina Jenkin ,PharmD Clinical Pharmacist  10/21/2023  10:11 AM

## 2023-10-21 NOTE — Progress Notes (Signed)
 PHARMACIST - PHYSICIAN COMMUNICATION  CONCERNING:  Enoxaparin  (Lovenox ) for DVT Prophylaxis    RECOMMENDATION: Patient was prescribed enoxaprin 40mg  q24 hours for VTE prophylaxis.   Filed Weights   10/21/23 0917  Weight: 97.5 kg (215 lb 0.6 oz)    Body mass index is 29.99 kg/m.  Estimated Creatinine Clearance: 62 mL/min (by C-G formula based on SCr of 1.15 mg/dL).   Based on [facility] policy patient is candidate for enoxaparin  0.5mg /kg TBW SQ every 24 hours based on BMI being >30.  DESCRIPTION: Pharmacy has adjusted enoxaparin  dose per [facility] policy.  Patient is now receiving enoxaparin  0.5 mg/kg every 24 hours   Rankin CANDIE Dills, PharmD, [facility] 10/21/2023 11:56 AM

## 2023-10-21 NOTE — ED Notes (Signed)
 Reported increasing gurgle and agitation with increased need for O2 @ 6L to MD

## 2023-10-21 NOTE — ED Triage Notes (Signed)
 Patient to ED via [facility] from [facility] for AMS and an unwitnessed fall that occurred yesterday. Pt has a hematoma above right eye. Pt initially 88% on RA- placed on O2 by EMS. Productive cough noted.  163/82 116 cbg 97.9 97 HR

## 2023-10-21 NOTE — H&P (Signed)
 History and Physical    Ian Gill:[id number] DOB: 05-25-1945 DOA: 10/21/2023  DOS: the patient was seen and examined on 10/21/2023  PCP: Lenon Layman ORN, MD   Patient coming from: ALF  I have personally briefly reviewed patient's old medical records in [facility]  Chief Complaint: Altered mental status  HPI: Ian Gill is a pleasant 78 y.o. male with medical history significant for Parkinson's disease, COPD, history of seizure disorder on antiseizure medication, anxiety/depression, history of stroke who was brought in to [facility] ED for altered mental status.  Patient had productive cough with yellow sputum no blood.  Patient had a fall at the facility yesterday with possible mild head injury but did not seek care at that time.  There is no complaint of fever, chills, nausea, vomiting, dysuria.  Patient has dementia and his history is limited due to his inability to provide accurate history.  Patient's son was at bedside he lives in [place] and he did not know the detail.  ED Course: Upon arrival to the ED, patient is found to be hypoxemic requiring oxygen, hypertensive around 154/84, tachycardic around 99, tachypneic around 20, 99.8 F, COVID respiratory panels were negative blood cultures were sent comprehensive metabolic panel was checked.  Chest x-ray showed possible left-sided pneumonia.  Patient was given ceftriaxone  and azithromycin  and hospitalist service was consulted for evaluation for admission.  Review of Systems:  ROS  All other systems negative except as noted in the HPI.  Past Medical History:  Diagnosis Date   Anxiety    COPD (chronic obstructive pulmonary disease) (HCC)    Depression    Parkinson's disease (HCC)    Seizures (HCC)    Stroke [facility])     Past Surgical History:  Procedure Laterality Date   ANKLE CLOSED REDUCTION Left 09/11/2019   Procedure: CLOSED REDUCTION 2nd toe;  Surgeon: Ashley Soulier, DPM;  Location: [facility] ORS;  Service:  Podiatry;  Laterality: Left;   ORIF ANKLE FRACTURE Right 09/11/2019   Procedure: OPEN REDUCTION INTERNAL FIXATION (ORIF) ANKLE FRACTURE;  Surgeon: Ashley Soulier, DPM;  Location: [facility] ORS;  Service: Podiatry;  Laterality: Right;   Shoulder surgery     SPINAL FUSION     cervical   TOTAL KNEE ARTHROPLASTY       reports that he has quit smoking. He has never used smokeless tobacco. He reports current alcohol use of about 3.0 standard drinks of alcohol per week. He reports that he does not use drugs.  Allergies  Allergen Reactions   Bupropion Other (See Comments)    Seizures  Seizure seizures Seizure    Dilantin  [Phenytoin  Sodium Extended]     Past issues with toxicity per pt's son   Iodinated Contrast Media Swelling    Family History  Problem Relation Age of Onset   COPD Father     Prior to Admission medications   Medication Sig Start Date End Date Taking? Authorizing Provider  amLODipine  (NORVASC ) 10 MG tablet Take 10 mg by mouth daily. 11/13/22  Yes [provider]  atorvastatin  (LIPITOR) 20 MG tablet Take 1 tablet (20 mg total) by mouth daily. 07/13/20  Yes Jens Durand, MD  carbidopa -levodopa  (SINEMET  IR) 25-100 MG tablet Take 3 tablets by mouth 3 (three) times daily. 03/29/21  Yes [provider]  Cholecalciferol  125 MCG (5000 UT) capsule Take 5,000 Units by mouth daily.   Yes [provider]  clopidogrel  (PLAVIX ) 75 MG tablet Take 1 tablet (75 mg total) by mouth daily. 07/11/15  Yes Viviann Pastor, MD  donepezil  (ARICEPT ) 5 MG tablet Take 5 mg by mouth daily. 01/13/20  Yes [provider]  folic acid  (FOLVITE ) 1 MG tablet Take 1 tablet (1 mg total) by mouth daily. 09/15/19  Yes Drusilla Sabas RAMAN, MD  lamoTRIgine  (LAMICTAL ) 150 MG tablet Take 1 tablet by mouth 2 (two) times daily. 12/13/21  Yes [provider]  levETIRAcetam  (KEPPRA ) 750 MG tablet Take 750 mg by mouth 2 (two) times daily. 12/13/21 10/21/23 Yes [provider]   lisinopril  (ZESTRIL ) 20 MG tablet Take 20 mg by mouth daily. 11/20/22  Yes [provider]  mirabegron  ER (MYRBETRIQ ) 50 MG TB24 tablet Take 1 tablet (50 mg total) by mouth daily. 12/11/22  Yes Stoioff, Glendia BROCKS, MD  Multiple Vitamins-Minerals (CEROVITE SENIOR) TABS Take by mouth. 0.4 mg-300 mg-250 mg   Yes [provider]  Omega-3 Fatty Acids (RA FISH OIL) 1000 MG CAPS Take 1 capsule by mouth daily.   Yes [provider]  propranolol  (INDERAL ) 10 MG tablet Take 0.5 tablets by mouth 2 (two) times daily. 12/13/21  Yes [provider]  sertraline  (ZOLOFT ) 100 MG tablet Take 200 mg by mouth daily. 10/28/18  Yes [provider]  silodosin  (RAPAFLO ) 8 MG CAPS capsule Take 1 capsule (8 mg total) by mouth daily with breakfast. 12/11/22  Yes Stoioff, Glendia BROCKS, MD  traZODone  (DESYREL ) 50 MG tablet Take 50 mg by mouth at bedtime. 03/22/21  Yes [provider]  furosemide  (LASIX ) 20 MG tablet Take 0.5 tablets (10 mg total) by mouth daily for 4 days. 06/04/23 06/08/23  Fisher, Devere ORN, PA-C  ibuprofen (ADVIL) 200 MG tablet Take 200 mg by mouth every 8 (eight) hours as needed.    [provider]  loperamide (IMODIUM) 2 MG capsule Take by mouth.  Take 2 mg by mouth 4 (four) times daily as needed for Diarrhea Patient not taking: Reported on 10/21/2023    [provider]  propranolol  (INDERAL ) 20 MG tablet Take 10 mg by mouth 2 (two) times daily. Patient not taking: Reported on 10/21/2023    [provider]    Physical Exam: Vitals:   10/21/23 0920 10/21/23 1000 10/21/23 1030 10/21/23 1100  BP: (!) 154/84 (!) 140/81 (!) 142/75 128/74  Pulse: 99 97 97 95  Resp: 20 18 (!) 27 14  Temp: 99.8 F (37.7 C)     TempSrc: Oral     SpO2: 96% 96% 91% (!) 89%  Weight:      Height:        Physical Exam   Constitutional: Alert, awake very confused HEENT: Neck supple Respiratory: Bilateral extensive wheezing and transmitted  sounds Cardiovascular: Regular rate and rhythm, no murmurs / rubs / gallops. No extremity edema. 2+ pedal pulses. No carotid bruits.  Abdomen: Soft, no tenderness, Bowel sounds positive.  Musculoskeletal: no clubbing / cyanosis. Good ROM, no contractures. Normal muscle tone.  Skin: no rashes, lesions, ulcers. Neurologic: CN 2-12 grossly intact. Sensation intact, No focal deficit identified Psychiatric: Alert awake and confused  Labs on Admission: I have personally reviewed following labs and imaging studies  CBC: Recent Labs  Lab 10/21/23 0936  WBC 14.7*  HGB 15.4  HCT 45.4  MCV 97.6  PLT 223   Basic Metabolic Panel: Recent Labs  Lab 10/21/23 0936  NA 142  K 3.9  CL 103  CO2 26  GLUCOSE 121*  BUN 15  CREATININE 1.15  CALCIUM  9.7   GFR: Estimated Creatinine Clearance: 62  mL/min (by C-G formula based on SCr of 1.15 mg/dL). Liver Function Tests: Recent Labs  Lab 10/21/23 0936  AST 51*  ALT 7  ALKPHOS 67  BILITOT 1.1  PROT 7.2  ALBUMIN 4.4   No results for input(s): LIPASE, AMYLASE in the last 168 hours. No results for input(s): AMMONIA in the last 168 hours. Coagulation Profile: Recent Labs  Lab 10/21/23 0936  INR 1.1   Cardiac Enzymes: No results for input(s): CKTOTAL, CKMB, CKMBINDEX, TROPONINI, TROPONINIHS in the last 168 hours. BNP (last 3 results) Recent Labs    06/04/23 1707  BNP 172.4*   HbA1C: No results for input(s): HGBA1C in the last 72 hours. CBG: No results for input(s): GLUCAP in the last 168 hours. Lipid Profile: No results for input(s): CHOL, HDL, LDLCALC, TRIG, CHOLHDL, LDLDIRECT in the last 72 hours. Thyroid  Function Tests: No results for input(s): TSH, T4TOTAL, FREET4, T3FREE, THYROIDAB in the last 72 hours. Anemia Panel: No results for input(s): VITAMINB12, FOLATE, FERRITIN, TIBC, IRON, RETICCTPCT in the last 72 hours. Urine analysis:    Component Value Date/Time    COLORURINE YELLOW (A) 10/21/2023 0936   APPEARANCEUR HAZY (A) 10/21/2023 0936   APPEARANCEUR Clear 04/01/2021 1333   LABSPEC 1.020 10/21/2023 0936   LABSPEC 1.004 02/04/2014 1838   PHURINE 5.0 10/21/2023 0936   GLUCOSEU NEGATIVE 10/21/2023 0936   GLUCOSEU Negative 02/04/2014 1838   HGBUR NEGATIVE 10/21/2023 0936   BILIRUBINUR NEGATIVE 10/21/2023 0936   BILIRUBINUR Negative 04/01/2021 1333   BILIRUBINUR Negative 02/04/2014 1838   KETONESUR 5 (A) 10/21/2023 0936   PROTEINUR 30 (A) 10/21/2023 0936   NITRITE NEGATIVE 10/21/2023 0936   LEUKOCYTESUR NEGATIVE 10/21/2023 0936   LEUKOCYTESUR Negative 02/04/2014 1838    Radiological Exams on Admission: I have personally reviewed images DG Chest Port 1 View Result Date: 10/21/2023 CLINICAL DATA:  Questionable sepsis. Unwitnessed fall. Hematoma above right eye. Decreased O2 sats. EXAM: PORTABLE CHEST 1 VIEW COMPARISON:  06/04/2023. FINDINGS: Trachea is midline. Heart is enlarged, stable. Thoracic aorta is calcified. Streaky densities in the lingula and left lower lobe. No dense airspace consolidation. No pleural fluid. Right shoulder arthroplasty. Left shoulder degenerative changes. Old lateral left clavicle fracture. IMPRESSION: Lingular and left lower lobe streaky densities may be due to atelectasis. Difficult to exclude aspiration or bronchopneumonia. Electronically Signed   By: Newell Eke M.D.   On: 10/21/2023 10:32    EKG: My personal interpretation of EKG shows: His EKG has a transmitted artifact?  A-fib but no change from previous one    Assessment/Plan Principal Problem:   CAP (community acquired pneumonia) Active Problems:   Seizure (HCC)   COPD (chronic obstructive pulmonary disease) (HCC)   HLD (hyperlipidemia)   GERD (gastroesophageal reflux disease)   HTN (hypertension)   Acute metabolic encephalopathy    Assessment and Plan:  78 year old male from assisted living facility with multiple medical problems including but  not limited to seizure disorder, Parkinson's disease, dementia, HTN, HLD, COPD who was brought in for acute change in mental status.  1.  Acute change in mental status/sepsis/pneumonia - He will be admitted to hospital as inpatient - He received ceftriaxone  and azithromycin  in the emergency room. - Cultures have been sent. - He will be continued on ceftriaxone  and azithromycin . - Will continue to monitor his mental status - He is confused - We may need to give him chemical and physical restraints.  That was discussed with the son who was at bedside.  2.  Seizure disorder - Patient's  son stated that sometimes post seizure he will have those kind of activities. - Will continue his seizure medications Keppra  and lamotrigine  at home dose. - Seizure precaution - Fall precaution  3.  Parkinson's disease - Continue carbidopa /levodopa  at home dose - Continue Inderal   4.  HTN - Blood pressure is stable - Continue lisinopril  and amlodipine . - Continue to monitor blood pressure  5.  Hyperlipidemia - Continue Lipitor 20 mg at bedtime  6.  History of fall at assisted living facility - Will get a CT scan of the brain to make sure he does not have bleeding - Fall precaution   DVT prophylaxis: Lovenox  Code Status: DNR/DNI(Do NOT Intubate) Family Communication: Son at beside  Disposition Plan: ALF  Consults called: None  Admission status: Inpatient, Telemetry bed   Nena Rebel, MD [facility] 10/21/2023, 12:35 PM

## 2023-10-21 NOTE — Sepsis Progress Note (Signed)
 Sepsis protocol monitored by eLink

## 2023-10-21 NOTE — ED Provider Notes (Signed)
 [facility] Provider Note    Event Date/Time   First MD Initiated Contact with Patient 10/21/23 0915     (approximate)   History   Altered Mental Status  Patient unable to provide significant history HPI  Ian Gill is a 78 y.o. male with history of COPD, Parkinson's disease, seizures presents with altered mental status.  Patient has productive cough, reports fall yesterday with possible mild head injury, did not seek care at that time.     Physical Exam   Triage Vital Signs: ED Triage Vitals  Encounter Vitals Group     BP 10/21/23 0920 (!) 154/84     Girls Systolic BP Percentile --      Girls Diastolic BP Percentile --      Boys Systolic BP Percentile --      Boys Diastolic BP Percentile --      Pulse Rate 10/21/23 0920 99     Resp 10/21/23 0920 20     Temp 10/21/23 0920 99.8 F (37.7 C)     Temp Source 10/21/23 0920 Oral     SpO2 10/21/23 0920 96 %     Weight 10/21/23 0917 97.5 kg (215 lb 0.6 oz)     Height 10/21/23 0917 1.803 m (5' 11)     Head Circumference --      Peak Flow --      Pain Score --      Pain Loc --      Pain Education --      Exclude from Growth Chart --     Most recent vital signs: Vitals:   10/21/23 1230 10/21/23 1300  BP: 117/66 91/70  Pulse: 98 83  Resp:    Temp:    SpO2: 97% (!) 79%     General: Awake, ill-appearing CV:  Good peripheral perfusion.  Resp:  Normal effort, bibasilar rales, productive junky cough Abd:  No distention.  Soft, nontender Other:  No pain with axial load on both hips   ED Results / Procedures / Treatments   Labs (all labs ordered are listed, but only abnormal results are displayed) Labs Reviewed  COMPREHENSIVE METABOLIC PANEL WITH GFR - Abnormal; Notable for the following components:      Result Value   Glucose, Bld 121 (*)    AST 51 (*)    All other components within normal limits  CBC - Abnormal; Notable for the following components:   WBC 14.7 (*)    All other  components within normal limits  LACTIC ACID, PLASMA - Abnormal; Notable for the following components:   Lactic Acid, Venous 2.1 (*)    All other components within normal limits  URINALYSIS, W/ REFLEX TO CULTURE (INFECTION SUSPECTED) - Abnormal; Notable for the following components:   Color, Urine YELLOW (*)    APPearance HAZY (*)    Ketones, ur 5 (*)    Protein, ur 30 (*)    Bacteria, UA RARE (*)    All other components within normal limits  RESP PANEL BY RT-PCR (RSV, FLU A&B, COVID)  [id number]  CULTURE, BLOOD (ROUTINE X 2)  CULTURE, BLOOD (ROUTINE X 2)  PROTIME-INR  LACTIC ACID, PLASMA  STREP PNEUMONIAE URINARY ANTIGEN  LEGIONELLA PNEUMOPHILA SEROGP 1 UR AG  CBC  CREATININE, SERUM  CBG MONITORING, ED     EKG  ED ECG REPORT I, Lamar Price, the attending physician, personally viewed and interpreted this ECG.  Date: 10/21/2023  Rhythm: Atrial fibrillation QRS Axis: normal Intervals:  Abnormal ST/T Wave abnormalities: normal Narrative Interpretation: no evidence of acute ischemia    RADIOLOGY Chest x-ray consistent with pneumonia    PROCEDURES:  Critical Care performed: yes  CRITICAL CARE Performed by: Lamar Price   Total critical care time: 30 minutes  Critical care time was exclusive of separately billable procedures and treating other patients.  Critical care was necessary to treat or prevent imminent or life-threatening deterioration.  Critical care was time spent personally by me on the following activities: development of treatment plan with patient and/or surrogate as well as nursing, discussions with consultants, evaluation of patient's response to treatment, examination of patient, obtaining history from patient or surrogate, ordering and performing treatments and interventions, ordering and review of laboratory studies, ordering and review of radiographic studies, pulse oximetry and re-evaluation of patient's  condition.   Procedures   MEDICATIONS ORDERED IN ED: Medications  cefTRIAXone  (ROCEPHIN ) 2 g in sodium chloride  0.9 % 100 mL IVPB (has no administration in time range)  azithromycin  (ZITHROMAX ) 500 mg in sodium chloride  0.9 % 250 mL IVPB (has no administration in time range)  enoxaparin  (LOVENOX ) injection 50 mg (has no administration in time range)  acetaminophen  (TYLENOL ) tablet 650 mg (has no administration in time range)    Or  acetaminophen  (TYLENOL ) suppository 650 mg (has no administration in time range)  polyethylene glycol (MIRALAX  / GLYCOLAX ) packet 17 g (has no administration in time range)  ondansetron  (ZOFRAN ) tablet 4 mg (has no administration in time range)    Or  ondansetron  (ZOFRAN ) injection 4 mg (has no administration in time range)  ipratropium-albuterol  (DUONEB) 0.5-2.5 (3) MG/3ML nebulizer solution 3 mL (has no administration in time range)  carbidopa -levodopa  (SINEMET  IR) 25-100 MG per tablet immediate release 3 tablet (has no administration in time range)  amLODipine  (NORVASC ) tablet 10 mg (has no administration in time range)  atorvastatin  (LIPITOR) tablet 20 mg (has no administration in time range)  clopidogrel  (PLAVIX ) tablet 75 mg (has no administration in time range)  donepezil  (ARICEPT ) tablet 5 mg (has no administration in time range)  folic acid  (FOLVITE ) tablet 1 mg (has no administration in time range)  lamoTRIgine  (LAMICTAL ) tablet 150 mg (has no administration in time range)  levETIRAcetam  (KEPPRA ) tablet 750 mg (has no administration in time range)  lisinopril  (ZESTRIL ) tablet 20 mg (has no administration in time range)  propranolol  (INDERAL ) tablet 5 mg (has no administration in time range)  sertraline  (ZOLOFT ) tablet 200 mg (has no administration in time range)  traZODone  (DESYREL ) tablet 50 mg (has no administration in time range)  cefTRIAXone  (ROCEPHIN ) 2 g in sodium chloride  0.9 % 100 mL IVPB (0 g Intravenous Stopped 10/21/23 1034)   azithromycin  (ZITHROMAX ) 500 mg in sodium chloride  0.9 % 250 mL IVPB (0 mg Intravenous Stopped 10/21/23 1225)  LORazepam  (ATIVAN ) injection 0.5 mg (0.5 mg Intravenous Given 10/21/23 1133)  levETIRAcetam  (KEPPRA ) undiluted injection 1,500 mg (1,500 mg Intravenous Given 10/21/23 1322)  LORazepam  (ATIVAN ) injection 2 mg (2 mg Intravenous Given 10/21/23 1322)     IMPRESSION / MDM / ASSESSMENT AND PLAN / ED COURSE  I reviewed the triage vital signs and the nursing notes. Patient's presentation is most consistent with acute presentation with potential threat to life or bodily function.  Patient presents with altered mental status, found to be febrile here highly suspicious for sepsis, infection  Given junky cough, low oxygen saturations will start nasal cannula oxygen as needed, broad-spectrum antibiotics, code sepsis,   Lab work notable for elevated lactic acid  of 2.1, white blood cell count of 14.7, chest x-ray consistent with pneumonia.  COVID test negative  Have consulted the hospitalist for admission      FINAL CLINICAL IMPRESSION(S) / ED DIAGNOSES   Final diagnoses:  Sepsis, due to unspecified organism, unspecified whether acute organ dysfunction present (HCC)  Pneumonia due to infectious organism, unspecified laterality, unspecified part of lung     Rx / DC Orders   ED Discharge Orders     None        Note:  This document was prepared using Dragon voice recognition software and may include unintentional dictation errors.   Arlander Charleston, MD 10/21/23 1331

## 2023-10-22 DIAGNOSIS — J189 Pneumonia, unspecified organism: Secondary | ICD-10-CM | POA: Diagnosis not present

## 2023-10-22 LAB — RESPIRATORY PANEL BY PCR

## 2023-10-22 LAB — COMPREHENSIVE METABOLIC PANEL WITH GFR
ALT: 6 U/L (ref 0–44)
AST: 42 U/L — ABNORMAL HIGH (ref 15–41)
Albumin: 3.8 g/dL (ref 3.5–5.0)
Alkaline Phosphatase: 57 U/L (ref 38–126)
Anion gap: 9 (ref 5–15)
BUN: 20 mg/dL (ref 8–23)
CO2: 26 mmol/L (ref 22–32)
Calcium: 9.1 mg/dL (ref 8.9–10.3)
Chloride: 105 mmol/L (ref 98–111)
Creatinine, Ser: 1.08 mg/dL (ref 0.61–1.24)
GFR, Estimated: >60 mL/min (ref >60)
Glucose, Bld: 100 mg/dL — ABNORMAL HIGH (ref 70–99)
Potassium: 3.6 mmol/L (ref 3.5–5.1)
Sodium: 140 mmol/L (ref 135–145)
Total Bilirubin: 1.4 mg/dL — ABNORMAL HIGH (ref 0.0–1.2)
Total Protein: 6.6 g/dL (ref 6.5–8.1)

## 2023-10-22 LAB — CBC
HCT: 39.9 % (ref 39.0–52.0)
Hemoglobin: 13.5 g/dL (ref 13.0–17.0)
MCH: 33.5 pg (ref 26.0–34.0)
MCHC: 33.8 g/dL (ref 30.0–36.0)
MCV: 99 fL (ref 80.0–100.0)
Platelets: 165 K/uL (ref 150–400)
RBC: 4.03 MIL/uL — ABNORMAL LOW (ref 4.22–5.81)
RDW: 12.3 % (ref 11.5–15.5)
WBC: 16.2 K/uL — ABNORMAL HIGH (ref 4.0–10.5)
nRBC: 0 % (ref 0.0–0.2)

## 2023-10-22 LAB — STREP PNEUMONIAE URINARY ANTIGEN: Strep Pneumo Urinary Antigen: NEGATIVE

## 2023-10-22 LAB — PROTIME-INR
INR: 1.4 — ABNORMAL HIGH (ref 0.8–1.2)
Prothrombin Time: 18.2 s — ABNORMAL HIGH (ref 11.4–15.2)

## 2023-10-22 MED ORDER — TAMSULOSIN HCL 0.4 MG PO CAPS
0.4000 mg | ORAL_CAPSULE | Freq: Every day | ORAL | Status: DC
  Administered 2023-10-22 – 2023-10-25 (×4): 0.4 mg via ORAL
  Filled 2023-10-22 (×4): qty 1

## 2023-10-22 MED ORDER — MIRABEGRON ER 50 MG PO TB24
50.0000 mg | ORAL_TABLET | Freq: Every day | ORAL | Status: DC
  Administered 2023-10-22 – 2023-10-26 (×5): 50 mg via ORAL
  Filled 2023-10-22 (×5): qty 1

## 2023-10-22 MED ORDER — SODIUM CHLORIDE 0.9 % IV SOLN: INTRAVENOUS | Status: DC

## 2023-10-22 NOTE — Plan of Care (Signed)
  Problem: Health Behavior/Discharge Planning: Goal: Ability to manage health-related needs will improve Outcome: Progressing   Problem: Elimination: Goal: Will not experience complications related to bowel motility Outcome: Progressing Goal: Will not experience complications related to urinary retention Outcome: Progressing   Problem: Clinical Measurements: Goal: Ability to maintain clinical measurements within normal limits will improve Outcome: Not Progressing Goal: Will remain free from infection Outcome: Not Progressing Goal: Respiratory complications will improve Outcome: Not Progressing   Problem: Activity: Goal: Risk for activity intolerance will decrease Outcome: Not Progressing   Problem: Nutrition: Goal: Adequate nutrition will be maintained Outcome: Not Progressing

## 2023-10-22 NOTE — Progress Notes (Signed)
 PROGRESS NOTE    [facility]  FMW:[id number] DOB: [birth year] DOA: 10/21/2023 PCP: [facility] ORN, MD  Outpatient Specialists: neurology    Brief Narrative:   From admission h and p   I[facility] Gill is a pleasant 78 y.o. male with medical history significant for Parkinson's disease, COPD, history of seizure disorder on antiseizure medication, anxiety/depression, history of stroke who was brought in to [facility] for altered mental status.  Patient had productive cough with yellow sputum no blood.  Patient had a fall at the facility yesterday with possible mild head injury but did not seek care at that time.  There is no complaint of fever, chills, nausea, vomiting, dysuria.  Patient has dementia and his history is limited due to his inability to provide accurate history.  Patient's son was at bedside he lives in [place] and he did not know the detail.   Assessment & Plan:   Principal Problem:   CAP (community acquired pneumonia) Active Problems:   Seizure (HCC)   COPD (chronic obstructive pulmonary disease) (HCC)   Stroke (HCC)   HLD (hyperlipidemia)   GERD (gastroesophageal reflux disease)   HTN (hypertension)   Acute metabolic encephalopathy   Alcohol dependence (HCC)   BPH (benign prostatic hyperplasia)   OSA (obstructive sleep apnea)   Parkinson disease (HCC)  # CAP With fever, LLL infiltrate, cough, hypoxia. Covid neg, strep pneumo antigen neg - continue ceftriaxone /azithromycin  - follow legionella antigen, rvp, blood cultures - consider anaerobic coverage if fails to respond to the above - swallow eval  # Debility # Fall Resides in assited living at [facility]. Unwitnessed fall few days ago, ct head/neck neg, no apparent injury - PT eval tomorrow, today mental status will preclude accurate assessment  # Acute metabolic encephalopathy May be secondary to infection, may also be post-ictal as son thinks acting like he normally does after seizure. Could have  both - infection lowers seizure threshold. - treat as above  # Parkinson's disease - home sinemet   # History CVA - home statin - hold home plavix   # Hypertension Bp controlled - home lisinopril , amlodipine   # Seizure disorder Seizure concern as above. Follows w/ neurology. Did get keppra  load in ER - seizure precautions - home lamictal , keppra , consider increase in keppra  dose  # GAD - home sertraline   # BPH - flomax  for home rapaflo  - home myrbetriq   DVT prophylaxis: lovenox  Code Status: dnr/dni Family Communication: son [facility] updated telephonically 9/25  Level of care: Telemetry Medical Status is: Inpatient Remains inpatient appropriate because: severity of illness    Consultants:  none  Procedures: none  Antimicrobials:  Ceftriaxone /azithromycin     Subjective:  No complaints, denies pain, no n/v/d, denies dysuria. Coughs several times.  Objective: Vitals:   10/21/23 2142 10/22/23 0027 10/22/23 0504 10/22/23 0834  BP:  120/60 114/76 124/64  Pulse:  93 99 (!) 104  Resp:  18 18 19   Temp:  99.1 F (37.3 C) 98.7 F (37.1 C) 98.3 F (36.8 C)  TempSrc: Oral Oral  Oral  SpO2:  95% 98% 97%  Weight:      Height:        Intake/Output Summary (Last 24 hours) at 10/22/2023 0905 Last data filed at 10/22/2023 0209 Gross per 24 hour  Intake 250 ml  Output 401 ml  Net -151 ml   Filed Weights   10/21/23 0917  Weight: 97.5 kg    Examination:  General exam: Appears calm and comfortable  Respiratory system: rales left  lung Cardiovascular system: S1 & S2 heard, RRR.   Gastrointestinal system: Abdomen is nondistended, soft and nontender.   Central nervous system: lethargic, answer simple questions, non-focal Extremities: warm, no edema Skin: No visible rashes, lesions or ulcers Psychiatry: some confusion, slow to respond    Data Reviewed: I have personally reviewed following labs and imaging studies  CBC: Recent Labs  Lab 10/21/23 0936  10/22/23 0521  WBC 14.7* 16.2*  HGB 15.4 13.5  HCT 45.4 39.9  MCV 97.6 99.0  PLT 223 165   Basic Metabolic Panel: Recent Labs  Lab 10/21/23 0936 10/22/23 0521  NA 142 140  K 3.9 3.6  CL 103 105  CO2 26 26  GLUCOSE 121* 100*  BUN 15 20  CREATININE 1.15 1.08  CALCIUM  9.7 9.1   GFR: Estimated Creatinine Clearance: 66.1 mL/min (by C-G formula based on SCr of 1.08 mg/dL). Liver Function Tests: Recent Labs  Lab 10/21/23 0936 10/22/23 0521  AST 51* 42*  ALT 7 6  ALKPHOS 67 57  BILITOT 1.1 1.4*  PROT 7.2 6.6  ALBUMIN 4.4 3.8   No results for input(s): LIPASE, AMYLASE in the last 168 hours. No results for input(s): AMMONIA in the last 168 hours. Coagulation Profile: Recent Labs  Lab 10/21/23 0936 10/22/23 0521  INR 1.1 1.4*   Cardiac Enzymes: No results for input(s): CKTOTAL, CKMB, CKMBINDEX, TROPONINI in the last 168 hours. BNP (last 3 results) No results for input(s): PROBNP in the last 8760 hours. HbA1C: No results for input(s): HGBA1C in the last 72 hours. CBG: No results for input(s): GLUCAP in the last 168 hours. Lipid Profile: No results for input(s): CHOL, HDL, LDLCALC, TRIG, CHOLHDL, LDLDIRECT in the last 72 hours. Thyroid  Function Tests: No results for input(s): TSH, T4TOTAL, FREET4, T3FREE, THYROIDAB in the last 72 hours. Anemia Panel: No results for input(s): VITAMINB12, FOLATE, FERRITIN, TIBC, IRON, RETICCTPCT in the last 72 hours. Urine analysis:    Component Value Date/Time   COLORURINE YELLOW (A) 10/21/2023 0936   APPEARANCEUR HAZY (A) 10/21/2023 0936   APPEARANCEUR Clear 04/01/2021 1333   LABSPEC 1.020 10/21/2023 0936   LABSPEC 1.004 02/04/2014 1838   PHURINE 5.0 10/21/2023 0936   GLUCOSEU NEGATIVE 10/21/2023 0936   GLUCOSEU Negative 02/04/2014 1838   HGBUR NEGATIVE 10/21/2023 0936   BILIRUBINUR NEGATIVE 10/21/2023 0936   BILIRUBINUR Negative 04/01/2021 1333   BILIRUBINUR Negative  02/04/2014 1838   KETONESUR 5 (A) 10/21/2023 0936   PROTEINUR 30 (A) 10/21/2023 0936   NITRITE NEGATIVE 10/21/2023 0936   LEUKOCYTESUR NEGATIVE 10/21/2023 0936   LEUKOCYTESUR Negative 02/04/2014 1838   Sepsis Labs: @LABRCNTIP (procalcitonin:4,lacticidven:4)  ) Recent Results (from the past 240 hours)  Resp panel by RT-PCR (RSV, Flu A&B, Covid) Anterior Nasal Swab     Status: None   Collection Time: 10/21/23  9:36 AM   Specimen: Anterior Nasal Swab  Result Value Ref Range Status   SARS Coronavirus 2 by RT PCR NEGATIVE NEGATIVE Final    Comment: (NOTE) SARS-CoV-2 target nucleic acids are NOT DETECTED.  The SARS-CoV-2 RNA is generally detectable in upper respiratory specimens during the acute phase of infection. The lowest concentration of SARS-CoV-2 viral copies this assay can detect is 138 copies/mL. A negative result does not preclude SARS-Cov-2 infection and should not be used as the sole basis for treatment or other patient management decisions. A negative result may occur with  improper specimen collection/handling, submission of specimen other than nasopharyngeal swab, presence of viral mutation(s) within the areas targeted by this  assay, and inadequate number of viral copies(<138 copies/mL). A negative result must be combined with clinical observations, patient history, and epidemiological information. The expected result is Negative.  Fact Sheet for Patients:  BloggerCourse.com  Fact Sheet for Healthcare Providers:  SeriousBroker.it  This test is no t yet approved or cleared by the [place]  FDA and  has been authorized for detection and/or diagnosis of SARS-CoV-2 by FDA under an Emergency Use Authorization (EUA). This EUA will remain  in effect (meaning this test can be used) for the duration of the COVID-19 declaration under Section 564(b)(1) of the Act, 21 U.S.C.section 360bbb-3(b)(1), unless the authorization is  terminated  or revoked sooner.       Influenza A by PCR NEGATIVE NEGATIVE Final   Influenza B by PCR NEGATIVE NEGATIVE Final    Comment: (NOTE) The Xpert Xpress SARS-CoV-2/FLU/RSV plus assay is intended as an aid in the diagnosis of influenza from Nasopharyngeal swab specimens and should not be used as a sole basis for treatment. Nasal washings and aspirates are unacceptable for Xpert Xpress SARS-CoV-2/FLU/RSV testing.  Fact Sheet for Patients: BloggerCourse.com  Fact Sheet for Healthcare Providers: SeriousBroker.it  This test is not yet approved or cleared by the [place]  FDA and has been authorized for detection and/or diagnosis of SARS-CoV-2 by FDA under an Emergency Use Authorization (EUA). This EUA will remain in effect (meaning this test can be used) for the duration of the COVID-19 declaration under Section 564(b)(1) of the Act, 21 U.S.C. section 360bbb-3(b)(1), unless the authorization is terminated or revoked.     Resp Syncytial Virus by PCR NEGATIVE NEGATIVE Final    Comment: (NOTE) Fact Sheet for Patients: BloggerCourse.com  Fact Sheet for Healthcare Providers: SeriousBroker.it  This test is not yet approved or cleared by the [place]  FDA and has been authorized for detection and/or diagnosis of SARS-CoV-2 by FDA under an Emergency Use Authorization (EUA). This EUA will remain in effect (meaning this test can be used) for the duration of the COVID-19 declaration under Section 564(b)(1) of the Act, 21 U.S.C. section 360bbb-3(b)(1), unless the authorization is terminated or revoked.  Performed at [facility], [address] [place], [place] [address]   Blood Culture (routine x 2)     Status: None (Preliminary result)   Collection Time: 10/21/23  9:36 AM   Specimen: BLOOD  Result Value Ref Range Status   Specimen Description BLOOD BLOOD  RIGHT HAND  Final   Special Requests   Final    BOTTLES DRAWN AEROBIC AND ANAEROBIC Blood Culture adequate volume   Culture   Final    NO GROWTH < 24 HOURS Performed at [facility], [address] [place], [place] [address]    Report Status PENDING  Incomplete  Blood Culture (routine x 2)     Status: None (Preliminary result)   Collection Time: 10/21/23  9:36 AM   Specimen: BLOOD  Result Value Ref Range Status   Specimen Description BLOOD BLOOD LEFT FOREARM  Final   Special Requests   Final    BOTTLES DRAWN AEROBIC AND ANAEROBIC Blood Culture adequate volume   Culture   Final    NO GROWTH < 24 HOURS Performed at [facility], [address], [place], [place] [address]    Report Status PENDING  Incomplete         Radiology Studies: CT HEAD WO CONTRAST ( ) Result Date: 10/21/2023 CLINICAL DATA:  Provided history: Ataxia, acute, traumatic. Additional history provided: Fall. Altered. EXAM: CT  HEAD WITHOUT CONTRAST TECHNIQUE: Contiguous axial images were obtained from the base of the skull through the vertex without intravenous contrast. RADIATION DOSE REDUCTION: This exam was performed according to the departmental dose-optimization program which includes automated exposure control, adjustment of the mA and/or kV according to patient size and/or use of iterative reconstruction technique. COMPARISON:  Head CT 06/05/2023. FINDINGS: Brain: Generalized cerebral atrophy. There is no acute intracranial hemorrhage. No demarcated cortical infarct. No extra-axial fluid collection. No evidence of an intracranial mass. No midline shift. Vascular: No hyperdense vessel.  Atherosclerotic calcifications. Skull: No calvarial fracture or aggressive osseous lesion. Sinuses/Orbits: No mass or acute finding within the imaged orbits. Small mucous retention cyst within the left maxillary sinus. Other: Small foci of subcutaneous emphysema along the nasal bridge. IMPRESSION: 1. No evidence of  an acute intracranial abnormality. 2. Generalized cerebral atrophy. 3. Small foci of subcutaneous emphysema along the nasal bridge. Correlate with the physical exam findings for a laceration at this site. 4. Small left maxillary sinus mucous retention cyst. Electronically Signed   By: Rockey Childs D.O.   On: 10/21/2023 13:50   DG Chest Port 1 View Result Date: 10/21/2023 CLINICAL DATA:  Questionable sepsis. Unwitnessed fall. Hematoma above right eye. Decreased O2 sats. EXAM: PORTABLE CHEST 1 VIEW COMPARISON:  06/04/2023. FINDINGS: Trachea is midline. Heart is enlarged, stable. Thoracic aorta is calcified. Streaky densities in the lingula and left lower lobe. No dense airspace consolidation. No pleural fluid. Right shoulder arthroplasty. Left shoulder degenerative changes. Old lateral left clavicle fracture. IMPRESSION: Lingular and left lower lobe streaky densities may be due to atelectasis. Difficult to exclude aspiration or bronchopneumonia. Electronically Signed   By: Newell Eke M.D.   On: 10/21/2023 10:32        Scheduled Meds:  amLODipine   10 mg Oral Daily   atorvastatin   20 mg Oral QHS   carbidopa -levodopa   3 tablet Oral TID   clopidogrel   75 mg Oral Daily   donepezil   5 mg Oral QHS   enoxaparin  (LOVENOX ) injection  50 mg Subcutaneous Q24H   folic acid   1 mg Oral Daily   lamoTRIgine   150 mg Oral BID   levETIRAcetam   750 mg Oral BID   lisinopril   20 mg Oral Daily   propranolol   5 mg Oral BID   sertraline   200 mg Oral Daily   traZODone   50 mg Oral QHS   Continuous Infusions:  azithromycin      cefTRIAXone  (ROCEPHIN )  IV       LOS: 1 day     Devaughn KATHEE Ban, MD [facility]   If 7PM-7AM, please contact night-coverage [email or web address] Password [email or web address] 10/22/2023, 9:05 AM

## 2023-10-22 NOTE — Progress Notes (Signed)
 SLP Cancellation Note  Patient Details Name: WACO FOERSTER MRN: [id number] DOB: 1945-06-23   Cancelled treatment:       Reason Eval/Treat Not Completed: Medical issues which prohibited therapy;Fatigue/lethargy limiting ability to participate   SLP attempted Bedside Swallow Evaluation. Upon therapist entrance, pt lethargic despite verbal/tactile cues, repositioning, and completion of oral care. Pt with mouth open, snoring. O2 checked, with pt O2 saturations at 88-89 on 2L nasal canula. Pt bumped to 3L and nurse notified of low saturations. Will hold bedside swallow evaluation pending increased alertness and medical stability. MD and RN aware.   [place] Wyoma Genson Clapp, MS, CCC-SLP Speech Language Pathologist Rehab Services; [facility] [phone] (ascom)    [place] J Clapp 10/22/2023, 2:25 PM

## 2023-10-22 NOTE — Progress Notes (Signed)
 BP 90/52, P 97 T 100.3 and 90% on 3L [place]. MD notified.

## 2023-10-22 NOTE — NC FL2 (Addendum)
 [place]  MEDICAID FL2 LEVEL OF CARE FORM     IDENTIFICATION  Patient Name: [facility] Birthdate: 1946/01/23 Sex: male Admission Date (Current Location): 10/21/2023  [facility] and [place] Number:  Chiropodist and Address:  [facility], [address], [place], [place] [address]      Provider Number: [id number]  Attending Physician Name and Address:  Kandis Devaughn Sayres, MD  Relative Name and Phone Number:  Hedden,Brent Vidal)  [phone]    Current Level of Care: Hospital Recommended Level of Care: Skilled Nursing Facility Prior Approval Number:    Date Approved/Denied:   PASRR Number:  [id number]   Discharge Plan: SNF    Current Diagnoses: Patient Active Problem List   Diagnosis Date Noted   CAP (community acquired pneumonia) 10/21/2023   Parkinson disease (HCC) 05/06/2023   Physical deconditioning 07/06/2020   COVID-19    Weakness    Hypertensive urgency 09/08/2019   Fall 09/08/2019   COPD (chronic obstructive pulmonary disease) (HCC)    Depression    Stroke (HCC)    HLD (hyperlipidemia)    GERD (gastroesophageal reflux disease)    HTN (hypertension)    Acute metabolic encephalopathy    Postictal state (HCC) 05/31/2019   Seizure (HCC) 05/30/2019   Alcohol use 05/30/2019   Post-ictal state (HCC) 05/30/2019   OSA (obstructive sleep apnea) 11/28/2013   Alcohol dependence (HCC) 02/01/2009   BPH (benign prostatic hyperplasia) 08/29/2008    Orientation RESPIRATION BLADDER Height & Weight     Self    Incontinent Weight: 97.5 kg Height:  5' 11 (180.3 cm)  BEHAVIORAL SYMPTOMS/MOOD NEUROLOGICAL BOWEL NUTRITION STATUS      Incontinent Diet (DYSPHAGIA 3)  AMBULATORY STATUS COMMUNICATION OF NEEDS Skin   Limited Assist Verbally                         Personal Care Assistance Level of Assistance  Bathing, Feeding, Dressing Bathing Assistance: Limited assistance Feeding assistance: Limited assistance Dressing  Assistance: Limited assistance     Functional Limitations Info             SPECIAL CARE FACTORS FREQUENCY  PT (By licensed PT), OT (By licensed OT)     PT Frequency: 5 x week OT Frequency: 5 x week            Contractures      Additional Factors Info  Allergies, Code Status Code Status Info: DNR Allergies Info: Bupropion, Dilantin , Iodine  Contrast Media           Current Medications (10/22/2023):  This is the current hospital active medication list Current Facility-Administered Medications  Medication Dose Route Frequency Provider Last Rate Last Admin   0.9 %  sodium chloride  infusion   Intravenous Continuous Kandis Devaughn Sayres, MD 125 mL/hr at 10/22/23 1219 New Bag at 10/22/23 1219   acetaminophen  (TYLENOL ) tablet 650 mg  650 mg Oral Q6H PRN Paudel, Keshab, MD   650 mg at 10/22/23 1212   Or   acetaminophen  (TYLENOL ) suppository 650 mg  650 mg Rectal Q6H PRN Roann Gouty, MD       atorvastatin  (LIPITOR) tablet 20 mg  20 mg Oral QHS Paudel, Keshab, MD   20 mg at 10/21/23 2145   azithromycin  (ZITHROMAX ) 500 mg in sodium chloride  0.9 % 250 mL IVPB  500 mg Intravenous Q24H Paudel, Gouty, MD 250 mL/hr at 10/22/23 1037 500 mg at 10/22/23 1037   carbidopa -levodopa  (SINEMET  IR) 25-100 MG per  tablet immediate release 3 tablet  3 tablet Oral TID Paudel, Keshab, MD   3 tablet at 10/22/23 1009   cefTRIAXone  (ROCEPHIN ) 2 g in sodium chloride  0.9 % 100 mL IVPB  2 g Intravenous Q24H Paudel, Nena, MD 200 mL/hr at 10/22/23 1148 2 g at 10/22/23 1148   donepezil  (ARICEPT ) tablet 5 mg  5 mg Oral QHS Paudel, Nena, MD   5 mg at 10/21/23 2145   enoxaparin  (LOVENOX ) injection 50 mg  50 mg Subcutaneous Q24H Paudel, Nena, MD   50 mg at 10/21/23 2146   folic acid  (FOLVITE ) tablet 1 mg  1 mg Oral Daily Paudel, Keshab, MD   1 mg at 10/22/23 1013   ipratropium-albuterol  (DUONEB) 0.5-2.5 (3) MG/3ML nebulizer solution 3 mL  3 mL Nebulization Q6H PRN Roann Nena, MD       lamoTRIgine   (LAMICTAL ) tablet 150 mg  150 mg Oral BID Paudel, Keshab, MD   150 mg at 10/22/23 1012   levETIRAcetam  (KEPPRA ) tablet 750 mg  750 mg Oral BID Paudel, Keshab, MD   750 mg at 10/22/23 1015   mirabegron  ER (MYRBETRIQ ) tablet 50 mg  50 mg Oral Daily Wouk, Devaughn Sayres, MD   50 mg at 10/22/23 1009   ondansetron  (ZOFRAN ) tablet 4 mg  4 mg Oral Q6H PRN Roann Nena, MD       Or   ondansetron  (ZOFRAN ) injection 4 mg  4 mg Intravenous Q6H PRN Paudel, Keshab, MD       polyethylene glycol (MIRALAX  / GLYCOLAX ) packet 17 g  17 g Oral Daily PRN Paudel, Nena, MD       propranolol  (INDERAL ) tablet 5 mg  5 mg Oral BID Paudel, Keshab, MD   5 mg at 10/22/23 1014   sertraline  (ZOLOFT ) tablet 200 mg  200 mg Oral Daily Paudel, Keshab, MD   200 mg at 10/22/23 1010   tamsulosin  (FLOMAX ) capsule 0.4 mg  0.4 mg Oral QPC supper Wouk, Devaughn Sayres, MD       traZODone  (DESYREL ) tablet 50 mg  50 mg Oral QHS Paudel, Keshab, MD   50 mg at 10/21/23 2145     Discharge Medications: Please see discharge summary for a list of discharge medications.  Relevant Imaging Results:  Relevant Lab Results:   Additional Information SSN  [id number]  Dalia GORMAN Fuse, RN

## 2023-10-23 DIAGNOSIS — J189 Pneumonia, unspecified organism: Secondary | ICD-10-CM | POA: Diagnosis not present

## 2023-10-23 LAB — CBC
HCT: 39 % (ref 39.0–52.0)
Hemoglobin: 13 g/dL (ref 13.0–17.0)
MCH: 33.1 pg (ref 26.0–34.0)
MCHC: 33.3 g/dL (ref 30.0–36.0)
MCV: 99.2 fL (ref 80.0–100.0)
Platelets: 143 K/uL — ABNORMAL LOW (ref 150–400)
RBC: 3.93 MIL/uL — ABNORMAL LOW (ref 4.22–5.81)
RDW: 12.1 % (ref 11.5–15.5)
WBC: 11.5 K/uL — ABNORMAL HIGH (ref 4.0–10.5)
nRBC: 0 % (ref 0.0–0.2)

## 2023-10-23 LAB — BASIC METABOLIC PANEL WITH GFR
Anion gap: 12 (ref 5–15)
BUN: 21 mg/dL (ref 8–23)
CO2: 23 mmol/L (ref 22–32)
Calcium: 8.6 mg/dL — ABNORMAL LOW (ref 8.9–10.3)
Chloride: 109 mmol/L (ref 98–111)
Creatinine, Ser: 1.16 mg/dL (ref 0.61–1.24)
GFR, Estimated: >60 mL/min (ref >60)
Glucose, Bld: 86 mg/dL (ref 70–99)
Potassium: 3.5 mmol/L (ref 3.5–5.1)
Sodium: 144 mmol/L (ref 135–145)

## 2023-10-23 LAB — LEGIONELLA PNEUMOPHILA SEROGP 1 UR AG: L. pneumophila Serogp 1 Ur Ag: NEGATIVE

## 2023-10-23 MED ORDER — METRONIDAZOLE 500 MG/100ML IV SOLN
500.0000 mg | Freq: Two times a day (BID) | INTRAVENOUS | Status: DC
  Administered 2023-10-23 – 2023-10-25 (×5): 500 mg via INTRAVENOUS
  Filled 2023-10-23 (×5): qty 100

## 2023-10-23 MED ORDER — AZITHROMYCIN 500 MG PO TABS
500.0000 mg | ORAL_TABLET | Freq: Every day | ORAL | Status: AC
Start: 2023-10-24 — End: 2023-10-25
  Administered 2023-10-24 – 2023-10-25 (×2): 500 mg via ORAL
  Filled 2023-10-23 (×2): qty 1

## 2023-10-23 NOTE — Plan of Care (Signed)

## 2023-10-23 NOTE — Progress Notes (Signed)
 PROGRESS NOTE    Ian Gill  FMW:[id number] DOB: 22-Jun-1945 DOA: 10/21/2023 PCP: Lenon Layman ORN, MD  Outpatient Specialists: neurology    Brief Narrative:   From admission h and p   Ian Gill is a pleasant 78 y.o. male with medical history significant for Parkinson's disease, COPD, history of seizure disorder on antiseizure medication, anxiety/depression, history of stroke who was brought in to [facility] ED for altered mental status.  Patient had productive cough with yellow sputum no blood.  Patient had a fall at the facility yesterday with possible mild head injury but did not seek care at that time.  There is no complaint of fever, chills, nausea, vomiting, dysuria.  Patient has dementia and his history is limited due to his inability to provide accurate history.  Patient's son was at bedside he lives in [place] and he did not know the detail.   Assessment & Plan:   Principal Problem:   CAP (community acquired pneumonia) Active Problems:   Seizure (HCC)   COPD (chronic obstructive pulmonary disease) (HCC)   Stroke (HCC)   HLD (hyperlipidemia)   GERD (gastroesophageal reflux disease)   HTN (hypertension)   Acute metabolic encephalopathy   Alcohol dependence (HCC)   BPH (benign prostatic hyperplasia)   OSA (obstructive sleep apnea)   Parkinson disease (HCC)  # CAP With fever, LLL infiltrate, cough, hypoxia. Covid neg, strep pneumo antigen neg. Improving. Rvp neg - continue ceftriaxone /azithromycin , add flagyl  as stronger concern for aspiration - follow legionella antigen, blood cultures  # Acute hypoxic respiratory failure Weaned to 1 liter today. 2/2 pna  # Dysphagia SLP following, cleared for dysphagia 1 diet currently  # Debility # Fall Resides in assited living at [facility]. Unwitnessed fall few days ago, ct head/neck neg, no apparent injury - PT eval today   # Acute metabolic encephalopathy May be secondary to infection, may also be post-ictal as  son thinks acting like he normally does after seizure. Could have both - infection lowers seizure threshold. Much improved today - d/c tele monitoring  # Parkinson's disease - home sinemet   # History CVA - home statin - hold home plavix  while inpt  # Hypertension Bp controlled - home lisinopril , amlodipine   # Seizure disorder Seizure concern as above. Follows w/ neurology. Did get keppra  load in ER - seizure precautions - home lamictal , keppra , neuro advises no changes to meds - reports slurred speech today, tongue laceration likely contributing, if no improvement consider mri of brain  # GAD - home sertraline   # BPH - flomax  for home rapaflo  - home myrbetriq   DVT prophylaxis: lovenox  Code Status: dnr/dni Family Communication: son Thresa updated telephonically 9/26  Level of care: Telemetry Medical Status is: Inpatient Remains inpatient appropriate because: severity of illness    Consultants:  none  Procedures: none  Antimicrobials:  Ceftriaxone /azithromycin     Subjective:  No complaints, confusion resolved, reports coughing less. No pain  Objective: Vitals:   10/22/23 2354 10/23/23 0441 10/23/23 0918 10/23/23 0934  BP: 105/61 125/60 (!) 140/67 (!) 140/67  Pulse: 90 92 93   Resp: 16 16 18    Temp: 98.3 F (36.8 C) 98.3 F (36.8 C)    TempSrc: Oral Oral    SpO2: 96% 98% 96%   Weight:      Height:        Intake/Output Summary (Last 24 hours) at 10/23/2023 1236 Last data filed at 10/23/2023 1211 Gross per 24 hour  Intake 2213.61 ml  Output 400 ml  Net 1813.61 ml   Filed Weights   10/21/23 0917  Weight: 97.5 kg    Examination:  General exam: Appears calm and comfortable  Respiratory system: rales left lung Cardiovascular system: S1 & S2 heard, RRR.   Gastrointestinal system: Abdomen is nondistended, soft and nontender.   Central nervous system: aaox4, moves all r Extremities: warm, no edema Skin: No visible rashes, lesions or  ulcers Psychiatry: calm    Data Reviewed: I have personally reviewed following labs and imaging studies  CBC: Recent Labs  Lab 10/21/23 0936 10/22/23 0521 10/23/23 0507  WBC 14.7* 16.2* 11.5*  HGB 15.4 13.5 13.0  HCT 45.4 39.9 39.0  MCV 97.6 99.0 99.2  PLT 223 165 143*   Basic Metabolic Panel: Recent Labs  Lab 10/21/23 0936 10/22/23 0521 10/23/23 0641  NA 142 140 144  K 3.9 3.6 3.5  CL 103 105 109  CO2 26 26 23   GLUCOSE 121* 100* 86  BUN 15 20 21   CREATININE 1.15 1.08 1.16  CALCIUM  9.7 9.1 8.6*   GFR: Estimated Creatinine Clearance: 61.5 mL/min (by C-G formula based on SCr of 1.16 mg/dL). Liver Function Tests: Recent Labs  Lab 10/21/23 0936 10/22/23 0521  AST 51* 42*  ALT 7 6  ALKPHOS 67 57  BILITOT 1.1 1.4*  PROT 7.2 6.6  ALBUMIN 4.4 3.8   No results for input(s): LIPASE, AMYLASE in the last 168 hours. No results for input(s): AMMONIA in the last 168 hours. Coagulation Profile: Recent Labs  Lab 10/21/23 0936 10/22/23 0521  INR 1.1 1.4*   Cardiac Enzymes: No results for input(s): CKTOTAL, CKMB, CKMBINDEX, TROPONINI in the last 168 hours. BNP (last 3 results) No results for input(s): PROBNP in the last 8760 hours. HbA1C: No results for input(s): HGBA1C in the last 72 hours. CBG: No results for input(s): GLUCAP in the last 168 hours. Lipid Profile: No results for input(s): CHOL, HDL, LDLCALC, TRIG, CHOLHDL, LDLDIRECT in the last 72 hours. Thyroid  Function Tests: No results for input(s): TSH, T4TOTAL, FREET4, T3FREE, THYROIDAB in the last 72 hours. Anemia Panel: No results for input(s): VITAMINB12, FOLATE, FERRITIN, TIBC, IRON, RETICCTPCT in the last 72 hours. Urine analysis:    Component Value Date/Time   COLORURINE YELLOW (A) 10/21/2023 0936   APPEARANCEUR HAZY (A) 10/21/2023 0936   APPEARANCEUR Clear 04/01/2021 1333   LABSPEC 1.020 10/21/2023 0936   LABSPEC 1.004 02/04/2014 1838    PHURINE 5.0 10/21/2023 0936   GLUCOSEU NEGATIVE 10/21/2023 0936   GLUCOSEU Negative 02/04/2014 1838   HGBUR NEGATIVE 10/21/2023 0936   BILIRUBINUR NEGATIVE 10/21/2023 0936   BILIRUBINUR Negative 04/01/2021 1333   BILIRUBINUR Negative 02/04/2014 1838   KETONESUR 5 (A) 10/21/2023 0936   PROTEINUR 30 (A) 10/21/2023 0936   NITRITE NEGATIVE 10/21/2023 0936   LEUKOCYTESUR NEGATIVE 10/21/2023 0936   LEUKOCYTESUR Negative 02/04/2014 1838   Sepsis Labs: @LABRCNTIP (procalcitonin:4,lacticidven:4)  ) Recent Results (from the past 240 hours)  Resp panel by RT-PCR (RSV, Flu A&B, Covid) Anterior Nasal Swab     Status: None   Collection Time: 10/21/23  9:36 AM   Specimen: Anterior Nasal Swab  Result Value Ref Range Status   SARS Coronavirus 2 by RT PCR NEGATIVE NEGATIVE Final    Comment: (NOTE) SARS-CoV-2 target nucleic acids are NOT DETECTED.  The SARS-CoV-2 RNA is generally detectable in upper respiratory specimens during the acute phase of infection. The lowest concentration of SARS-CoV-2 viral copies this assay can detect is 138 copies/mL. A negative result does not preclude SARS-Cov-2 infection  and should not be used as the sole basis for treatment or other patient management decisions. A negative result may occur with  improper specimen collection/handling, submission of specimen other than nasopharyngeal swab, presence of viral mutation(s) within the areas targeted by this assay, and inadequate number of viral copies(<138 copies/mL). A negative result must be combined with clinical observations, patient history, and epidemiological information. The expected result is Negative.  Fact Sheet for Patients:  BloggerCourse.com  Fact Sheet for Healthcare Providers:  SeriousBroker.it  This test is no t yet approved or cleared by the [place]  FDA and  has been authorized for detection and/or diagnosis of SARS-CoV-2 by FDA under an  Emergency Use Authorization (EUA). This EUA will remain  in effect (meaning this test can be used) for the duration of the COVID-19 declaration under Section 564(b)(1) of the Act, 21 U.S.C.section 360bbb-3(b)(1), unless the authorization is terminated  or revoked sooner.       Influenza A by PCR NEGATIVE NEGATIVE Final   Influenza B by PCR NEGATIVE NEGATIVE Final    Comment: (NOTE) The Xpert Xpress SARS-CoV-2/FLU/RSV plus assay is intended as an aid in the diagnosis of influenza from Nasopharyngeal swab specimens and should not be used as a sole basis for treatment. Nasal washings and aspirates are unacceptable for Xpert Xpress SARS-CoV-2/FLU/RSV testing.  Fact Sheet for Patients: BloggerCourse.com  Fact Sheet for Healthcare Providers: SeriousBroker.it  This test is not yet approved or cleared by the [place]  FDA and has been authorized for detection and/or diagnosis of SARS-CoV-2 by FDA under an Emergency Use Authorization (EUA). This EUA will remain in effect (meaning this test can be used) for the duration of the COVID-19 declaration under Section 564(b)(1) of the Act, 21 U.S.C. section 360bbb-3(b)(1), unless the authorization is terminated or revoked.     Resp Syncytial Virus by PCR NEGATIVE NEGATIVE Final    Comment: (NOTE) Fact Sheet for Patients: BloggerCourse.com  Fact Sheet for Healthcare Providers: SeriousBroker.it  This test is not yet approved or cleared by the [place]  FDA and has been authorized for detection and/or diagnosis of SARS-CoV-2 by FDA under an Emergency Use Authorization (EUA). This EUA will remain in effect (meaning this test can be used) for the duration of the COVID-19 declaration under Section 564(b)(1) of the Act, 21 U.S.C. section 360bbb-3(b)(1), unless the authorization is terminated or revoked.  Performed at [facility], [address] [place], [place] [address]   Blood Culture (routine x 2)     Status: None (Preliminary result)   Collection Time: 10/21/23  9:36 AM   Specimen: BLOOD  Result Value Ref Range Status   Specimen Description BLOOD BLOOD RIGHT HAND  Final   Special Requests   Final    BOTTLES DRAWN AEROBIC AND ANAEROBIC Blood Culture adequate volume   Culture   Final    NO GROWTH 2 DAYS Performed at [facility], [address] [place], [place] [address]    Report Status PENDING  Incomplete  Blood Culture (routine x 2)     Status: None (Preliminary result)   Collection Time: 10/21/23  9:36 AM   Specimen: BLOOD  Result Value Ref Range Status   Specimen Description BLOOD BLOOD LEFT FOREARM  Final   Special Requests   Final    BOTTLES DRAWN AEROBIC AND ANAEROBIC Blood Culture adequate volume   Culture   Final    NO GROWTH 2 DAYS Performed at [facility], [address], [place], [place] [address]  Report Status PENDING  Incomplete  Respiratory (~20 pathogens) panel by PCR     Status: None   Collection Time: 10/22/23 10:44 AM   Specimen: Nasopharyngeal Swab; Respiratory  Result Value Ref Range Status   Adenovirus NOT DETECTED NOT DETECTED Final   Coronavirus 229E NOT DETECTED NOT DETECTED Final    Comment: (NOTE) The Coronavirus on the Respiratory Panel, DOES NOT test for the novel  Coronavirus (2019 nCoV)    Coronavirus HKU1 NOT DETECTED NOT DETECTED Final   Coronavirus NL63 NOT DETECTED NOT DETECTED Final   Coronavirus OC43 NOT DETECTED NOT DETECTED Final   Metapneumovirus NOT DETECTED NOT DETECTED Final   Rhinovirus / Enterovirus NOT DETECTED NOT DETECTED Final   Influenza A NOT DETECTED NOT DETECTED Final   Influenza B NOT DETECTED NOT DETECTED Final   Parainfluenza Virus 1 NOT DETECTED NOT DETECTED Final   Parainfluenza Virus 2 NOT DETECTED NOT DETECTED Final   Parainfluenza Virus 3 NOT DETECTED NOT DETECTED Final   Parainfluenza Virus 4 NOT  DETECTED NOT DETECTED Final   Respiratory Syncytial Virus NOT DETECTED NOT DETECTED Final   Bordetella pertussis NOT DETECTED NOT DETECTED Final   Bordetella Parapertussis NOT DETECTED NOT DETECTED Final   Chlamydophila pneumoniae NOT DETECTED NOT DETECTED Final   Mycoplasma pneumoniae NOT DETECTED NOT DETECTED Final    Comment: Performed at [facility], [address] [place], [place] [address]         Radiology Studies: CT HEAD WO CONTRAST ( ) Result Date: 10/21/2023 CLINICAL DATA:  Provided history: Ataxia, acute, traumatic. Additional history provided: Fall. Altered. EXAM: CT HEAD WITHOUT CONTRAST TECHNIQUE: Contiguous axial images were obtained from the base of the skull through the vertex without intravenous contrast. RADIATION DOSE REDUCTION: This exam was performed according to the departmental dose-optimization program which includes automated exposure control, adjustment of the mA and/or kV according to patient size and/or use of iterative reconstruction technique. COMPARISON:  Head CT 06/05/2023. FINDINGS: Brain: Generalized cerebral atrophy. There is no acute intracranial hemorrhage. No demarcated cortical infarct. No extra-axial fluid collection. No evidence of an intracranial mass. No midline shift. Vascular: No hyperdense vessel.  Atherosclerotic calcifications. Skull: No calvarial fracture or aggressive osseous lesion. Sinuses/Orbits: No mass or acute finding within the imaged orbits. Small mucous retention cyst within the left maxillary sinus. Other: Small foci of subcutaneous emphysema along the nasal bridge. IMPRESSION: 1. No evidence of an acute intracranial abnormality. 2. Generalized cerebral atrophy. 3. Small foci of subcutaneous emphysema along the nasal bridge. Correlate with the physical exam findings for a laceration at this site. 4. Small left maxillary sinus mucous retention cyst. Electronically Signed   By: Rockey Childs D.O.   On: 10/21/2023 13:50         Scheduled Meds:  atorvastatin   20 mg Oral QHS   carbidopa -levodopa   3 tablet Oral TID   donepezil   5 mg Oral QHS   enoxaparin  (LOVENOX ) injection  50 mg Subcutaneous Q24H   folic acid   1 mg Oral Daily   lamoTRIgine   150 mg Oral BID   levETIRAcetam   750 mg Oral BID   mirabegron  ER  50 mg Oral Daily   propranolol   5 mg Oral BID   sertraline   200 mg Oral Daily   tamsulosin   0.4 mg Oral QPC supper   traZODone   50 mg Oral QHS   Continuous Infusions:  sodium chloride  125 mL/hr at 10/23/23 0524   azithromycin  500 mg (10/23/23 0945)   cefTRIAXone  (ROCEPHIN )  IV  2 g (10/23/23 1110)     LOS: 2 days     Devaughn KATHEE Ban, MD [facility]   If 7PM-7AM, please contact night-coverage [email or web address] Password [facility] 10/23/2023, 12:36 PM

## 2023-10-23 NOTE — Progress Notes (Signed)
 PHARMACIST - PHYSICIAN COMMUNICATION CONCERNING: Antibiotic IV to Oral Route Change Policy  RECOMMENDATION: This patient is receiving azithromycin  intravenously. Based on criteria approved by the Pharmacy and Therapeutics Committee, the antibiotic(s) is/are being converted to the equivalent dose of an oral formulation.   DESCRIPTION: These criteria include: Patient being treated for a respiratory tract infection, urinary tract infection, cellulitis or Clostridioides difficile-associated diarrhea if on metronidazole . The patient is not neutropenic and does not exhibit a malabsorptive GI state. The patient is eating (either orally or via tube) and/or has been taking other orally administered medications for at least 24 hours. The patient is improving clinically and has a 24-hour Tmax of <100.5 F.  If you have questions about this conversion, please contact the Pharmacy Department:  [x]   [phone] )  [facility] []   [phone] )  [facility] []   [phone] )  [facility]  []   [phone] )  [facility]   Will M. Lenon, PharmD, BCPS Clinical Pharmacist 10/23/2023 1:37 PM

## 2023-10-23 NOTE — Evaluation (Signed)
 Occupational Therapy Evaluation Patient Details Name: Ian Gill MRN: [id number] DOB: 05-Apr-1945 Today's Date: 10/23/2023   History of Present Illness   Pt is a 78 y/o M presenting to ED from [facility] for AMS, unwitnessed fall, productive cough. Imaging negative. PMH significant for COPD, PD, seizures, anxiety, depression, dementia, hx of stroke.     Clinical Impressions Pt was seen for OT evaluation this date. Prior to hospital admission, pt was residing at [facility] at [place]. Pt is unable to report what part of this facility he lives in and no family is available to provide PLOF. Pt presents with deficits in strength, cognition, safety awareness, and activity tolerance, affecting safe and optimal ADL completion. Pt currently requires MAX A +2 to perform STS t/fs at EOB and to maintain standing balance. MAX A to maintain sitting balance at EOB with +2 assist to perform seated ADL management.  Pt would benefit from skilled OT services to address noted impairments and functional limitations (see below for any additional details) in order to maximize safety and independence while minimizing future risk of falls, injury, and readmission. Anticipate the need for follow up OT services upon acute hospital DC.      If plan is discharge home, recommend the following:   Two people to help with walking and/or transfers;A lot of help with bathing/dressing/bathroom;Help with stairs or ramp for entrance;Assist for transportation;Supervision due to cognitive status     Functional Status Assessment   Patient has had a recent decline in their functional status and demonstrates the ability to make significant improvements in function in a reasonable and predictable amount of time.     Equipment Recommendations   Other (comment) (defer)     Recommendations for Other Services         Precautions/Restrictions   Precautions Precautions: Fall Recall of  Precautions/Restrictions: Impaired Restrictions Weight Bearing Restrictions Per Provider Order: No     Mobility Bed Mobility Overal bed mobility: Needs Assistance Bed Mobility: Supine to Sit, Sit to Supine     Supine to sit: Max assist, +2 for physical assistance, HOB elevated Sit to supine: Max assist, +2 for physical assistance   General bed mobility comments: pt able to initiate bed mobility, maxAx2 for trunk/BLE assist    Transfers Overall transfer level: Needs assistance Equipment used: 2 person hand held assist Transfers: Sit to/from Stand Sit to Stand: Max assist, +2 physical assistance           General transfer comment: STS from EOB with maxAx2      Balance Overall balance assessment: Needs assistance Sitting-balance support: Feet supported Sitting balance-Leahy Scale: Poor Sitting balance - Comments: requires maxA to maintain upright in sitting   Standing balance support: Bilateral upper extremity supported, Reliant on assistive device for balance Standing balance-Leahy Scale: Zero Standing balance comment: heavy +2 to maintain standing                           ADL either performed or assessed with clinical judgement   ADL Overall ADL's : Needs assistance/impaired                                       General ADL Comments: MAX A to don bilat hospital socks at EOB, MAX A +2 for STS t/fs and to take side steps at EOB.     Vision Ability  to See in Adequate Light: 1 Impaired Patient Visual Report: No change from baseline       Perception         Praxis         Pertinent Vitals/Pain Pain Assessment Pain Assessment: Faces Faces Pain Scale: No hurt     Extremity/Trunk Assessment Upper Extremity Assessment Upper Extremity Assessment: Generalized weakness   Lower Extremity Assessment Lower Extremity Assessment: Generalized weakness       Communication Communication Communication: Impaired Factors Affecting  Communication: Reduced clarity of speech   Cognition Arousal: Alert Behavior During Therapy: Flat affect Cognition: Cognition impaired, No family/caregiver present to determine baseline, History of cognitive impairments             OT - Cognition Comments: Oriented to self and place, limited situation. But generally confused t/o sesison.                 Following commands: Impaired Following commands impaired: Follows one step commands inconsistently, Follows one step commands with increased time     Cueing  General Comments   Cueing Techniques: Verbal cues;Tactile cues;Visual cues      Exercises Other Exercises Other Exercises: Pt education limited 2/2 cognition.   Shoulder Instructions      Home Living Family/patient expects to be discharged to:: Assisted living                                 Additional Comments: difficult to assess home layout/equipment 2/2 cognition      Prior Functioning/Environment Prior Level of Function : Patient poor historian/Family not available             Mobility Comments: difficult to assess PLOF due to cognitive deficits ADLs Comments: from assisted living per chart    OT Problem List: Decreased strength;Decreased coordination;Decreased activity tolerance;Decreased safety awareness;Impaired balance (sitting and/or standing);Decreased knowledge of use of DME or AE;Decreased cognition   OT Treatment/Interventions: Self-care/ADL training;Therapeutic exercise;Therapeutic activities;Balance training;DME and/or AE instruction;Patient/family education;Energy conservation      OT Goals(Current goals can be found in the care plan section)   Acute Rehab OT Goals Patient Stated Goal: To rest my eyes OT Goal Formulation: With patient Time For Goal Achievement: 11/06/23 Potential to Achieve Goals: Good ADL Goals Pt Will Perform Grooming: sitting;with set-up;with supervision Pt Will Perform Lower Body Dressing:  sit to/from stand;with min assist;with adaptive equipment Pt Will Transfer to Toilet: bedside commode;stand pivot transfer;with set-up;with supervision Pt Will Perform Toileting - Clothing Manipulation and hygiene: sit to/from stand;with set-up;with supervision   OT Frequency:  Min 1X/week    Co-evaluation PT/OT/SLP Co-Evaluation/Treatment: Yes Reason for Co-Treatment: Complexity of the patient's impairments (multi-system involvement);For patient/therapist safety PT goals addressed during session: Mobility/safety with mobility;Balance OT goals addressed during session: ADL's and self-care;Proper use of Adaptive equipment and DME      AM-PAC OT 6 Clicks Daily Activity     Outcome Measure Help from another person eating meals?: A Little Help from another person taking care of personal grooming?: A Little Help from another person toileting, which includes using toliet, bedpan, or urinal?: A Lot Help from another person bathing (including washing, rinsing, drying)?: A Lot Help from another person to put on and taking off regular upper body clothing?: A Little Help from another person to put on and taking off regular lower body clothing?: A Lot 6 Click Score: 15   End of Session Nurse Communication: Mobility status  Activity Tolerance: Patient tolerated treatment well Patient left: with call bell/phone within reach;in bed;with bed alarm set  OT Visit Diagnosis: Other abnormalities of gait and mobility (R26.89);Muscle weakness (generalized) (M62.81)                Time: 8573-8554 OT Time Calculation (min): 19 min Charges:  OT General Charges $OT Visit: 1 Visit OT Evaluation $OT Eval Moderate Complexity: 1 Mod OT Treatments $Self Care/Home Management : 8-22 mins  Jhonny Pelton, M.S., OTR/L 10/23/23, 3:50 PM

## 2023-10-23 NOTE — Evaluation (Signed)
 Physical Therapy Evaluation Patient Details Name: Ian Gill MRN: [id number] DOB: 13-Dec-1945 Today's Date: 10/23/2023  History of Present Illness  Pt is a 78 y/o M presenting to ED from [facility] for AMS, unwitnessed fall, productive cough. Imaging negative. PMH significant for COPD, PD, seizures, anxiety, depression, dementia, hx of stroke.  Clinical Impression  Pt alert, difficult to assess baseline due to cognitive deficits, agreeable to participate in PT/OT co-evaluation. Pt was met supine in bed, maxAx2 trunk/BLE assist for bed mobility. Pt maintained sitting EOB for ~10 minutes during session, maxA at trunk to remain upright. Pt able to stand and side-step toward HOB x3 with BUE HHA- maxAx2 for facilitation of steps, no LOB. Pt was left supine in bed at end of session, all needs in reach. Pt would benefit from skilled PT intervention to address listed deficits (see PT Problem List) and improve independence with mobility/ADLs as able.         If plan is discharge home, recommend the following: Two people to help with walking and/or transfers;Two people to help with bathing/dressing/bathroom;Assistance with cooking/housework;Direct supervision/assist for medications management;Assist for transportation;Supervision due to cognitive status;Direct supervision/assist for financial management   Can travel by private vehicle   No    Equipment Recommendations Other (comment) (TBD)  Recommendations for Other Services       Functional Status Assessment Patient has had a recent decline in their functional status and demonstrates the ability to make significant improvements in function in a reasonable and predictable amount of time.     Precautions / Restrictions Precautions Precautions: Fall Recall of Precautions/Restrictions: Impaired Restrictions Weight Bearing Restrictions Per Provider Order: No      Mobility  Bed Mobility Overal bed mobility: Needs Assistance Bed Mobility:  Supine to Sit, Sit to Supine     Supine to sit: Max assist, +2 for physical assistance, HOB elevated Sit to supine: Max assist, +2 for physical assistance   General bed mobility comments: pt able to initiate bed mobility, maxAx2 for trunk/BLE assist    Transfers Overall transfer level: Needs assistance Equipment used: 2 person hand held assist Transfers: Sit to/from Stand Sit to Stand: Max assist, +2 physical assistance           General transfer comment: STS from EOB with maxAx2    Ambulation/Gait Ambulation/Gait assistance: Max assist, +2 physical assistance   Assistive device: 2 person hand held assist         General Gait Details: pt took ~3 side-steps toward [facility] with maxAx2 to faciliate movement, very narrow BOS and feet externally rotated throughout, no LOB  Stairs            Wheelchair Mobility     Tilt Bed    Modified Rankin (Stroke Patients Only)       Balance Overall balance assessment: Needs assistance Sitting-balance support: Feet supported Sitting balance-Leahy Scale: Poor Sitting balance - Comments: requires maxA to maintain upright in sitting   Standing balance support: Bilateral upper extremity supported, Reliant on assistive device for balance Standing balance-Leahy Scale: Zero Standing balance comment: heavy +2 to maintain standing                             Pertinent Vitals/Pain Pain Assessment Pain Assessment: Faces Faces Pain Scale: No hurt Pain Intervention(s): Monitored during session, Repositioned    Home Living Family/patient expects to be discharged to:: Assisted living  Additional Comments: difficult to assess home layout/equipment    Prior Function Prior Level of Function : Patient poor historian/Family not available             Mobility Comments: difficult to assess PLOF due to cognitive deficits       Extremity/Trunk Assessment   Upper Extremity Assessment Upper  Extremity Assessment: Defer to OT evaluation    Lower Extremity Assessment Lower Extremity Assessment: Generalized weakness       Communication   Communication Communication: Impaired Factors Affecting Communication: Reduced clarity of speech (very soft spoken)    Cognition Arousal: Alert Behavior During Therapy: Flat affect   PT - Cognitive impairments: History of cognitive impairments, No family/caregiver present to determine baseline                       PT - Cognition Comments: pt able to state that he is in [employer], says he is in hospital because of possible stroke Following commands: Impaired Following commands impaired: Follows one step commands inconsistently, Follows one step commands with increased time     Cueing Cueing Techniques: Verbal cues, Tactile cues, Visual cues     General Comments      Exercises     Assessment/Plan    PT Assessment Patient needs continued PT services  PT Problem List Decreased strength;Decreased activity tolerance;Decreased balance;Decreased mobility;Decreased coordination;Decreased cognition       PT Treatment Interventions DME instruction;Gait training;Functional mobility training;Therapeutic activities;Therapeutic exercise;Balance training;Neuromuscular re-education;Cognitive remediation;Patient/family education;Wheelchair mobility training    PT Goals (Current goals can be found in the Care Plan section)  Acute Rehab PT Goals Patient Stated Goal: none stated PT Goal Formulation: Patient unable to participate in goal setting Time For Goal Achievement: 11/06/23 Potential to Achieve Goals: Fair    Frequency Min 2X/week     Co-evaluation PT/OT/SLP Co-Evaluation/Treatment: Yes Reason for Co-Treatment: Complexity of the patient's impairments (multi-system involvement);For patient/therapist safety PT goals addressed during session: Mobility/safety with mobility;Balance         AM-PAC PT 6 Clicks  Mobility  Outcome Measure Help needed turning from your back to your side while in a flat bed without using bedrails?: A Little Help needed moving from lying on your back to sitting on the side of a flat bed without using bedrails?: A Lot Help needed moving to and from a bed to a chair (including a wheelchair)?: Total Help needed standing up from a chair using your arms (e.g., wheelchair or bedside chair)?: A Lot Help needed to walk in hospital room?: Total Help needed climbing 3-5 steps with a railing? : Total 6 Click Score: 10    End of Session Equipment Utilized During Treatment: Oxygen Activity Tolerance: Patient tolerated treatment well Patient left: in bed;with call bell/phone within reach;with bed alarm set Nurse Communication: Mobility status PT Visit Diagnosis: Unsteadiness on feet (R26.81);Other abnormalities of gait and mobility (R26.89);Muscle weakness (generalized) (M62.81);History of falling (Z91.81);Difficulty in walking, not elsewhere classified (R26.2)    Time: 8573-8554 PT Time Calculation (min) (ACUTE ONLY): 19 min   Charges:   PT Evaluation $PT Eval Low Complexity: 1 Low   PT General Charges $$ ACUTE PT VISIT: 1 Visit         Janell Axe, SPT

## 2023-10-23 NOTE — TOC Progression Note (Addendum)
 Transition of Care [facility]) - Progression Note    Patient Details  Name: Ian Gill MRN: [id number] Date of Birth: Jan 21, 1946  [facility] CM/SW Contact  Dalia GORMAN Fuse, RN Phone Number: 10/23/2023, 9:28 AM  Clinical Narrative:    TOC received a message from [facility] [phone], they can offer a SNF bed. [employer] to start ins authorization. Luke will be out of the office. Please call Holley Lawman [phone] when the patient is ready to discharge.  Pending Certification#:[id number]                    Expected Discharge Plan and Services                                               Social Drivers of Health (SDOH) Interventions SDOH Screenings   Food Insecurity: No Food Insecurity (10/21/2023)  Housing: Low Risk  (10/21/2023)  Transportation Needs: No Transportation Needs (10/21/2023)  Utilities: Not At Risk (10/21/2023)  Financial Resource Strain: Medium Risk (06/10/2023)   Received from [facility]  Social Connections: Socially Isolated (10/21/2023)  Tobacco Use: Medium Risk (10/21/2023)    Readmission Risk Interventions     No data to display

## 2023-10-23 NOTE — Evaluation (Addendum)
 Clinical/Bedside Swallow Evaluation Patient Details  Name: Ian Gill MRN: [id number] Date of Birth: Oct 15, 1945  Today's Date: 10/23/2023 Time: SLP Start Time (ACUTE ONLY): 0940 SLP Stop Time (ACUTE ONLY): 1045 SLP Time Calculation (min) (ACUTE ONLY): 65 min  Past Medical History:  Past Medical History:  Diagnosis Date   Anxiety    COPD (chronic obstructive pulmonary disease) (HCC)    Depression    Parkinson's disease (HCC)    Seizures (HCC)    Stroke [facility])    Past Surgical History:  Past Surgical History:  Procedure Laterality Date   ANKLE CLOSED REDUCTION Left 09/11/2019   Procedure: CLOSED REDUCTION 2nd toe;  Surgeon: Ashley Soulier, DPM;  Location: [facility] ORS;  Service: Podiatry;  Laterality: Left;   ORIF ANKLE FRACTURE Right 09/11/2019   Procedure: OPEN REDUCTION INTERNAL FIXATION (ORIF) ANKLE FRACTURE;  Surgeon: Ashley Soulier, DPM;  Location: [facility] ORS;  Service: Podiatry;  Laterality: Right;   Shoulder surgery     SPINAL FUSION     cervical   TOTAL KNEE ARTHROPLASTY     HPI: (per admission H&P) Ian Gill is a pleasant 78 y.o. male with medical history significant for Parkinson's disease, COPD, history of seizure disorder on antiseizure medication, anxiety/depression, history of stroke who was brought in to [facility] ED for altered mental status.  Patient had productive cough with yellow sputum no blood.  Patient had a fall at the facility yesterday with possible mild head injury but did not seek care at that time.  There is no complaint of fever, chills, nausea, vomiting, dysuria.  Patient has dementia and his history is limited due to his inability to provide accurate history.    CXR IMPRESSION (10/21/23): Lingular and left lower lobe streaky densities may be due to atelectasis. Difficult to exclude aspiration or bronchopneumonia.  Assessment / Plan / Recommendation  Clinical Impression  Mr. Sebald presents with a suspected acute oropharyngeal dysphagia in setting of  recent acute metabolic encephalopathy related to CAP which negatively impacts his safety and efficiency with unrestricted diet texture. Additionally, query possible post-seizure effects contributing toward current presentation as son Alonso) reports notably changed from baseline. Overt clinical indicators suggestive of aspiration observed with thin and regular texture solids. Additional dysphagia risk factors include neurogenic components (Parkinson's disease), feeding dependence, acute reduced intelligibility (judged to be ~30% conversationally) and cognition with AMS. Anticipate swallow function should continue to improve as mentation does; however, presently unsafe for unrestricted oral diet at this time. Instrumental assessment recommended to further inform dysphagia management and guide plan of care. Discussed options including benefits/burdens of continuance of oral diet in interim. Patient and son verbalized understanding of associated risks and amenable to modified diet to potentially lessen aspiration risk. Close monitoring of pulmonary status, feeding assistance and strict aspiration precautions recommended. SLP to follow.  SLP Visit Diagnosis: Dysphagia, oropharyngeal phase (R13.12);Dysphagia, pharyngoesophageal phase (R13.14)    Aspiration Risk  Moderate aspiration risk;Risk for inadequate nutrition/hydration    Diet Recommendation Dysphagia 1 (Puree);Nectar-thick liquid    Liquid Administration via: No straw Medication Administration: Crushed with puree Supervision: Staff to assist with self feeding Compensations: Minimize environmental distractions;Slow rate;Small sips/bites;Monitor for anterior loss Postural Changes: Remain upright for at least 30 minutes after po intake    Other  Recommendations Recommended Consults: Other (Comment) (RD consult for nutritional supplements; OT consult for adaptive feeding needs) Oral Care Recommendations: Oral care BID Caregiver Recommendations: Avoid  jello, ice cream, thin soups, popsicles;Remove water pitcher;Have oral suction available  Assistance Recommended at Discharge  Anticipate skilled SLP services needed  Functional Status Assessment Patient has had a recent decline in their functional status and demonstrates the ability to make significant improvements in function in a reasonable and predictable amount of time.  Frequency and Duration min 5x/week  2 weeks       Prognosis Prognosis for improved oropharyngeal function: Good Barriers to Reach Goals: Cognitive deficits      Swallow Study   General Date of Onset: 10/23/23 Type of Study: Bedside Swallow Evaluation Previous Swallow Assessment: 09/09/2019: Patient presents with no oral dysphagia and no s/s of pharyngeal dysphagia. Patient with noted poor/missing dentition, however did not affect bolus containment in oral cavity and/or mastication. D/t medical history and suspected cognitive/behavioral deficits (I.e. noted confusion and tangential speech), patients demonstrates mild risk for aspiration with possible risk of malnutrition/dehyration. However, no s/s of aspiration noted during the bedside swallowing examination today. In turn, recommended diet includes Regular and Thin liquid diet - May require feeding assistance. Patient denied current or previous history of swallowing issues, however report recent coughing episode when trying to take multiple whole pills at one time. SLP recommend patient to consider taking 1-2 whole pills at a time and/or taking whole pills in puree consistency to decrease risk of aspiration/choking with pills. Pt agreed to consider it. Education provided regarding diet recommendation and safe swallowing strategies to implement during meals: sit upright, slow rate, small bites/sips, consistent oral care, feeding assistance if needed. Pt verbalized understanding and agreement to the above recommendations. D/t noted confusion/tangential speech during BSE  today, MD to consider placing ST order for cognitive-communication evaluation if appropriate for current hospital stay. Diet Prior to this Study: Regular;Thin liquids (Level 0) Respiratory Status: Room air History of Recent Intubation: No Behavior/Cognition: Confused;Lethargic/Drowsy Oral Cavity Assessment: Dry Oral Cavity - Dentition: Poor condition Vision: Impaired for self-feeding Self-Feeding Abilities: Total assist Patient Positioning: Upright in bed Baseline Vocal Quality: Low vocal intensity; significantly reduced intelligibility Volitional Cough: Weak;Wet;Congested Volitional Swallow: Able to elicit    Oral/Motor/Sensory Function Overall Oral Motor/Sensory Function: Mild impairment Facial ROM: Within Functional Limits Facial Symmetry: Within Functional Limits Facial Strength: Reduced right Facial Sensation: Within Functional Limits Lingual ROM: Within Functional Limits Lingual Symmetry: Within Functional Limits Lingual Strength: Within Functional Limits Lingual Sensation: Within Functional Limits; small sore lesion noted on L tongue tip Velum: Within Functional Limits Mandible: Within Functional Limits   Ice Chips Ice chips: Not tested   Thin Liquid Thin Liquid: Impaired Presentation: Cup;Straw;Spoon Oral Phase Impairments: Reduced labial seal Oral Phase Functional Implications: Right anterior spillage;Prolonged oral transit;Oral holding Pharyngeal  Phase Impairments: Suspected delayed Swallow;Multiple swallows;Wet Vocal Quality;Throat Clearing - Delayed    Nectar Thick Nectar Thick Liquid: Within functional limits Presentation: Cup   Honey Thick Honey Thick Liquid: Not tested   Puree Puree: Within functional limits Presentation: Spoon   Solid     Solid: Impaired Oral Phase Impairments: Impaired mastication Oral Phase Functional Implications: Oral residue;Oral holding;Impaired mastication;Prolonged oral transit Pharyngeal Phase Impairments: Suspected delayed  Swallow;Multiple swallows;Throat Clearing - Delayed     Rosaline HERO. Krystal, MA, CCC-SLP Speech Language Pathologist [facility]  Rosaline HERO Krystal 10/23/2023,10:46 AM

## 2023-10-24 ENCOUNTER — Inpatient Hospital Stay

## 2023-10-24 DIAGNOSIS — J189 Pneumonia, unspecified organism: Secondary | ICD-10-CM | POA: Diagnosis not present

## 2023-10-24 LAB — CBC
HCT: 36.1 % — ABNORMAL LOW (ref 39.0–52.0)
Hemoglobin: 12.5 g/dL — ABNORMAL LOW (ref 13.0–17.0)
MCH: 33.3 pg (ref 26.0–34.0)
MCHC: 34.6 g/dL (ref 30.0–36.0)
MCV: 96.3 fL (ref 80.0–100.0)
Platelets: 149 K/uL — ABNORMAL LOW (ref 150–400)
RBC: 3.75 MIL/uL — ABNORMAL LOW (ref 4.22–5.81)
RDW: 12 % (ref 11.5–15.5)
WBC: 8.7 K/uL (ref 4.0–10.5)
nRBC: 0 % (ref 0.0–0.2)

## 2023-10-24 LAB — BASIC METABOLIC PANEL WITH GFR
Anion gap: 9 (ref 5–15)
BUN: 20 mg/dL (ref 8–23)
CO2: 24 mmol/L (ref 22–32)
Calcium: 8.8 mg/dL — ABNORMAL LOW (ref 8.9–10.3)
Chloride: 111 mmol/L (ref 98–111)
Creatinine, Ser: 0.8 mg/dL (ref 0.61–1.24)
GFR, Estimated: >60 mL/min (ref >60)
Glucose, Bld: 107 mg/dL — ABNORMAL HIGH (ref 70–99)
Potassium: 3.4 mmol/L — ABNORMAL LOW (ref 3.5–5.1)
Sodium: 144 mmol/L (ref 135–145)

## 2023-10-24 MED ORDER — MELATONIN 5 MG PO TABS
5.0000 mg | ORAL_TABLET | Freq: Every evening | ORAL | Status: DC | PRN
  Administered 2023-10-24: 5 mg via ORAL
  Filled 2023-10-24: qty 1

## 2023-10-24 NOTE — Plan of Care (Signed)
  Problem: Clinical Measurements: Goal: Ability to maintain clinical measurements within normal limits will improve Outcome: Progressing Goal: Will remain free from infection Outcome: Progressing Goal: Diagnostic test results will improve Outcome: Progressing  CT Cervical Spine wo Contrast performed this shift Problem: Activity: Goal: Risk for activity intolerance will decrease Outcome: Progressing   Problem: Nutrition: Goal: Adequate nutrition will be maintained Outcome: Progressing  Pt unable to feed self without spilling and dropping it after set up performed by staff this shift Problem: Elimination: Goal: Will not experience complications related to bowel motility Outcome: Progressing Goal: Will not experience complications related to urinary retention Outcome: Progressing   Problem: Pain Managment: Goal: General experience of comfort will improve and/or be controlled Outcome: Progressing   Problem: Safety: Goal: Ability to remain free from injury will improve Outcome: Progressing   Problem: Skin Integrity: Goal: Risk for impaired skin integrity will decrease Outcome: Progressing   Problem: Activity: Goal: Ability to tolerate increased activity will improve Outcome: Progressing   Problem: Clinical Measurements: Goal: Ability to maintain a body temperature in the normal range will improve Outcome: Progressing   Problem: Respiratory: Goal: Ability to maintain adequate ventilation will improve Outcome: Progressing Pt continues to cough after intake of dysphagia and nectar thick liquids; medications crushed in magic cup to date this shift. Goal: Ability to maintain a clear airway will improve Outcome: Progressing

## 2023-10-24 NOTE — Progress Notes (Addendum)
 Spoke with pt's son [name] at the bedside; [name] informed this Clinical research associate that the pt will usually just have one eye closed because he has double/blurred vision at baseline, in the past the pt has been seen by a Neurologist and an eye doctor for this; it is not unusual for him to have one or the other eye closed when he is looking at something; [name] said that having both eyes closed is something new for him; also the pt will ask for a straw, [name] knows he can not use a straw to drink out of per his current diet order (Dysphagia I, Nectar Thick, No Straws) but at [facility] they let him chew on a straw (prior to the pt living at [facility], the pt always had a toothpick in his mouth, [facility] substituted a straw for a toothpick); advised [name] that this Clinical research associate would document this information for future need/use

## 2023-10-24 NOTE — Progress Notes (Signed)
 PROGRESS NOTE    Ian Gill  FMW:[id number] DOB: 03-19-1945 DOA: 10/21/2023 PCP: Lenon Layman ORN, MD  Outpatient Specialists: neurology    Brief Narrative:   From admission h and p   Ian Gill is a pleasant 78 y.o. male with medical history significant for Parkinson's disease, COPD, history of seizure disorder on antiseizure medication, anxiety/depression, history of stroke who was brought in to [facility] ED for altered mental status.  Patient had productive cough with yellow sputum no blood.  Patient had a fall at the facility yesterday with possible mild head injury but did not seek care at that time.  There is no complaint of fever, chills, nausea, vomiting, dysuria.  Patient has dementia and his history is limited due to his inability to provide accurate history.  Patient's son was at bedside he lives in [place] and he did not know the detail.   Assessment & Plan:   Principal Problem:   CAP (community acquired pneumonia) Active Problems:   Seizure (HCC)   COPD (chronic obstructive pulmonary disease) (HCC)   Stroke (HCC)   HLD (hyperlipidemia)   GERD (gastroesophageal reflux disease)   HTN (hypertension)   Acute metabolic encephalopathy   Alcohol dependence (HCC)   BPH (benign prostatic hyperplasia)   OSA (obstructive sleep apnea)   Parkinson disease (HCC)  # CAP With fever, LLL infiltrate, cough, hypoxia. Covid neg, strep pneumo antigen neg. Improving. Rvp neg - continue ceftriaxone /azithromycin , added flagyl  as concern for aspiration - follow legionella antigen, blood cultures  # Acute hypoxic respiratory failure Weaned to room air today  # Neck pain New today, with improvement otherwise and no fever or headache, do not think meningitis, will check CT to r/o acute pathology  # Dysphagia SLP following, cleared for dysphagia 1 diet currently  # Debility # Fall Resides in assited living at [facility]. Unwitnessed fall few days ago, ct head/neck neg, no  apparent injury - PT advising snf, TOC to see of facility can accomodate  # Acute metabolic encephalopathy May be secondary to infection, may also be post-ictal as son thinks acting like he normally does after seizure. Could have both - infection lowers seizure threshold. Much improved yesterday and today  # Parkinson's disease - home sinemet   # History CVA - home statin - hold home plavix  while inpt  # Hypertension Bp controlled - home lisinopril , amlodipine   # Seizure disorder Seizure concern as above. Follows w/ neurology. Did get keppra  load in ER - seizure precautions - home lamictal , keppra , neuro advises no changes to meds  # GAD - home sertraline   # BPH - flomax  for home rapaflo  - home myrbetriq   DVT prophylaxis: lovenox  Code Status: dnr/dni Family Communication: son Thresa updated telephonically 9/26  Level of care: Telemetry Medical Status is: Inpatient Remains inpatient appropriate because: severity of illness, pending placement    Consultants:  none  Procedures: none  Antimicrobials:  Ceftriaxone /azithromycin /flagyl    Subjective:  No complaints, sitting up in bed eating, cough improved, reports neck   Objective: Vitals:   10/23/23 1718 10/23/23 2113 10/24/23 0444 10/24/23 0724  BP: (!) 134/59 126/62 (!) 149/80 (!) 148/88  Pulse: 89 91 84 96  Resp: 20 17 19 18   Temp: 97.8 F (36.6 C) 98.4 F (36.9 C) 98.4 F (36.9 C) 98.4 F (36.9 C)  TempSrc:      SpO2: 91% 95% 96% 94%  Weight:      Height:        Intake/Output Summary (Last 24 hours) at  10/24/2023 1555 Last data filed at 10/24/2023 1350 Gross per 24 hour  Intake 437.26 ml  Output --  Net 437.26 ml   Filed Weights   10/21/23 0917  Weight: 97.5 kg    Examination:  General exam: Appears calm and comfortable  Respiratory system: rales left lung Cardiovascular system: S1 & S2 heard, RRR.   Gastrointestinal system: Abdomen is nondistended, soft and nontender.   Central  nervous system: aaox4, moves all r Extremities: warm, no edema Skin: No visible rashes, lesions or ulcers Psychiatry: calm    Data Reviewed: I have personally reviewed following labs and imaging studies  CBC: Recent Labs  Lab 10/21/23 0936 10/22/23 0521 10/23/23 0507 10/24/23 0604  WBC 14.7* 16.2* 11.5* 8.7  HGB 15.4 13.5 13.0 12.5*  HCT 45.4 39.9 39.0 36.1*  MCV 97.6 99.0 99.2 96.3  PLT 223 165 143* 149*   Basic Metabolic Panel: Recent Labs  Lab 10/21/23 0936 10/22/23 0521 10/23/23 0641 10/24/23 0604  NA 142 140 144 144  K 3.9 3.6 3.5 3.4*  CL 103 105 109 111  CO2 26 26 23 24   GLUCOSE 121* 100* 86 107*  BUN 15 20 21 20   CREATININE 1.15 1.08 1.16 0.80  CALCIUM  9.7 9.1 8.6* 8.8*   GFR: Estimated Creatinine Clearance: 89.2 mL/min (by C-G formula based on SCr of 0.8 mg/dL). Liver Function Tests: Recent Labs  Lab 10/21/23 0936 10/22/23 0521  AST 51* 42*  ALT 7 6  ALKPHOS 67 57  BILITOT 1.1 1.4*  PROT 7.2 6.6  ALBUMIN 4.4 3.8   No results for input(s): LIPASE, AMYLASE in the last 168 hours. No results for input(s): AMMONIA in the last 168 hours. Coagulation Profile: Recent Labs  Lab 10/21/23 0936 10/22/23 0521  INR 1.1 1.4*   Cardiac Enzymes: No results for input(s): CKTOTAL, CKMB, CKMBINDEX, TROPONINI in the last 168 hours. BNP (last 3 results) No results for input(s): PROBNP in the last 8760 hours. HbA1C: No results for input(s): HGBA1C in the last 72 hours. CBG: No results for input(s): GLUCAP in the last 168 hours. Lipid Profile: No results for input(s): CHOL, HDL, LDLCALC, TRIG, CHOLHDL, LDLDIRECT in the last 72 hours. Thyroid  Function Tests: No results for input(s): TSH, T4TOTAL, FREET4, T3FREE, THYROIDAB in the last 72 hours. Anemia Panel: No results for input(s): VITAMINB12, FOLATE, FERRITIN, TIBC, IRON, RETICCTPCT in the last 72 hours. Urine analysis:    Component Value Date/Time    COLORURINE YELLOW (A) 10/21/2023 0936   APPEARANCEUR HAZY (A) 10/21/2023 0936   APPEARANCEUR Clear 04/01/2021 1333   LABSPEC 1.020 10/21/2023 0936   LABSPEC 1.004 02/04/2014 1838   PHURINE 5.0 10/21/2023 0936   GLUCOSEU NEGATIVE 10/21/2023 0936   GLUCOSEU Negative 02/04/2014 1838   HGBUR NEGATIVE 10/21/2023 0936   BILIRUBINUR NEGATIVE 10/21/2023 0936   BILIRUBINUR Negative 04/01/2021 1333   BILIRUBINUR Negative 02/04/2014 1838   KETONESUR 5 (A) 10/21/2023 0936   PROTEINUR 30 (A) 10/21/2023 0936   NITRITE NEGATIVE 10/21/2023 0936   LEUKOCYTESUR NEGATIVE 10/21/2023 0936   LEUKOCYTESUR Negative 02/04/2014 1838   Sepsis Labs: @LABRCNTIP (procalcitonin:4,lacticidven:4)  ) Recent Results (from the past 240 hours)  Resp panel by RT-PCR (RSV, Flu A&B, Covid) Anterior Nasal Swab     Status: None   Collection Time: 10/21/23  9:36 AM   Specimen: Anterior Nasal Swab  Result Value Ref Range Status   SARS Coronavirus 2 by RT PCR NEGATIVE NEGATIVE Final    Comment: (NOTE) SARS-CoV-2 target nucleic acids are NOT DETECTED.  The  SARS-CoV-2 RNA is generally detectable in upper respiratory specimens during the acute phase of infection. The lowest concentration of SARS-CoV-2 viral copies this assay can detect is 138 copies/mL. A negative result does not preclude SARS-Cov-2 infection and should not be used as the sole basis for treatment or other patient management decisions. A negative result may occur with  improper specimen collection/handling, submission of specimen other than nasopharyngeal swab, presence of viral mutation(s) within the areas targeted by this assay, and inadequate number of viral copies(<138 copies/mL). A negative result must be combined with clinical observations, patient history, and epidemiological information. The expected result is Negative.  Fact Sheet for Patients:  BloggerCourse.com  Fact Sheet for Healthcare Providers:   SeriousBroker.it  This test is no t yet approved or cleared by the [place]  FDA and  has been authorized for detection and/or diagnosis of SARS-CoV-2 by FDA under an Emergency Use Authorization (EUA). This EUA will remain  in effect (meaning this test can be used) for the duration of the COVID-19 declaration under Section 564(b)(1) of the Act, 21 U.S.C.section 360bbb-3(b)(1), unless the authorization is terminated  or revoked sooner.       Influenza A by PCR NEGATIVE NEGATIVE Final   Influenza B by PCR NEGATIVE NEGATIVE Final    Comment: (NOTE) The Xpert Xpress SARS-CoV-2/FLU/RSV plus assay is intended as an aid in the diagnosis of influenza from Nasopharyngeal swab specimens and should not be used as a sole basis for treatment. Nasal washings and aspirates are unacceptable for Xpert Xpress SARS-CoV-2/FLU/RSV testing.  Fact Sheet for Patients: BloggerCourse.com  Fact Sheet for Healthcare Providers: SeriousBroker.it  This test is not yet approved or cleared by the [place]  FDA and has been authorized for detection and/or diagnosis of SARS-CoV-2 by FDA under an Emergency Use Authorization (EUA). This EUA will remain in effect (meaning this test can be used) for the duration of the COVID-19 declaration under Section 564(b)(1) of the Act, 21 U.S.C. section 360bbb-3(b)(1), unless the authorization is terminated or revoked.     Resp Syncytial Virus by PCR NEGATIVE NEGATIVE Final    Comment: (NOTE) Fact Sheet for Patients: BloggerCourse.com  Fact Sheet for Healthcare Providers: SeriousBroker.it  This test is not yet approved or cleared by the [place]  FDA and has been authorized for detection and/or diagnosis of SARS-CoV-2 by FDA under an Emergency Use Authorization (EUA). This EUA will remain in effect (meaning this test can be used) for  the duration of the COVID-19 declaration under Section 564(b)(1) of the Act, 21 U.S.C. section 360bbb-3(b)(1), unless the authorization is terminated or revoked.  Performed at [facility], [address] [place], [place] [address]   Blood Culture (routine x 2)     Status: None (Preliminary result)   Collection Time: 10/21/23  9:36 AM   Specimen: BLOOD  Result Value Ref Range Status   Specimen Description BLOOD BLOOD RIGHT HAND  Final   Special Requests   Final    BOTTLES DRAWN AEROBIC AND ANAEROBIC Blood Culture adequate volume   Culture   Final    NO GROWTH 3 DAYS Performed at [facility], [address], [place], [place] [address]    Report Status PENDING  Incomplete  Blood Culture (routine x 2)     Status: None (Preliminary result)   Collection Time: 10/21/23  9:36 AM   Specimen: BLOOD  Result Value Ref Range Status   Specimen Description BLOOD BLOOD LEFT FOREARM  Final   Special Requests   Final  BOTTLES DRAWN AEROBIC AND ANAEROBIC Blood Culture adequate volume   Culture   Final    NO GROWTH 3 DAYS Performed at [facility], [address] [place], [place] [address]    Report Status PENDING  Incomplete  Respiratory (~20 pathogens) panel by PCR     Status: None   Collection Time: 10/22/23 10:44 AM   Specimen: Nasopharyngeal Swab; Respiratory  Result Value Ref Range Status   Adenovirus NOT DETECTED NOT DETECTED Final   Coronavirus 229E NOT DETECTED NOT DETECTED Final    Comment: (NOTE) The Coronavirus on the Respiratory Panel, DOES NOT test for the novel  Coronavirus (2019 nCoV)    Coronavirus HKU1 NOT DETECTED NOT DETECTED Final   Coronavirus NL63 NOT DETECTED NOT DETECTED Final   Coronavirus OC43 NOT DETECTED NOT DETECTED Final   Metapneumovirus NOT DETECTED NOT DETECTED Final   Rhinovirus / Enterovirus NOT DETECTED NOT DETECTED Final   Influenza A NOT DETECTED NOT DETECTED Final   Influenza B NOT DETECTED NOT DETECTED Final    Parainfluenza Virus 1 NOT DETECTED NOT DETECTED Final   Parainfluenza Virus 2 NOT DETECTED NOT DETECTED Final   Parainfluenza Virus 3 NOT DETECTED NOT DETECTED Final   Parainfluenza Virus 4 NOT DETECTED NOT DETECTED Final   Respiratory Syncytial Virus NOT DETECTED NOT DETECTED Final   Bordetella pertussis NOT DETECTED NOT DETECTED Final   Bordetella Parapertussis NOT DETECTED NOT DETECTED Final   Chlamydophila pneumoniae NOT DETECTED NOT DETECTED Final   Mycoplasma pneumoniae NOT DETECTED NOT DETECTED Final    Comment: Performed at [facility], [address] [place], [place] [address]         Radiology Studies: CT CERVICAL SPINE WO CONTRAST Result Date: 10/24/2023 EXAM: CT CERVICAL SPINE WITHOUT CONTRAST 10/24/2023 02:20:08 PM TECHNIQUE: CT of the cervical spine was performed without the administration of intravenous contrast. Multiplanar reformatted images are provided for review. Automated exposure control, iterative reconstruction, and/or weight based adjustment of the mA/kV was utilized to reduce the radiation dose to as low as reasonably achievable. COMPARISON: None available. CLINICAL HISTORY: Neck pain. Recent unwitnessed fall, patient complains of neck pain. Repeat portion of c-spine due to breathing motion. FINDINGS: CERVICAL SPINE: BONES AND ALIGNMENT: ACDF noted at C3-C4. Straightening of the normal cervical lordosis is present. Slight degenerative anterolisthesis is present at C4-C5 and C5-C6. No acute fracture or traumatic malalignment. DEGENERATIVE CHANGES: Chronic loss of disc height and endplate change with uncovertebral spurring results in moderate foraminal stenosis bilaterally at C6-C7. Severe right foraminal narrowing is present at C4-C5. Moderate-to-severe left foraminal narrowing is present at C5-C6. SOFT TISSUES: No prevertebral soft tissue swelling. Atherosclerotic calcifications are present in the proximal left subclavian artery without significant stenosis.  Calcifications are present at the carotid bifurcations bilaterally without definite stenosis. IMPRESSION: 1. No acute abnormality of the cervical spine related to the recent unwitnessed fall. 2. Straightening of the normal cervical lordosis. 3. Slight degenerative anterolisthesis at C4-5 and C5-6. 4. Moderate foraminal stenosis bilaterally at C6-7. 5. Severe right foraminal narrowing at C4-5. 6. Moderate-to-severe left foraminal narrowing at C5-6. Electronically signed by: Lonni Necessary MD 10/24/2023 02:28 PM EDT RP Workstation: [id number]        Scheduled Meds:  atorvastatin   20 mg Oral QHS   azithromycin   500 mg Oral Daily   carbidopa -levodopa   3 tablet Oral TID   donepezil   5 mg Oral QHS   enoxaparin  (LOVENOX ) injection  50 mg Subcutaneous Q24H   folic acid   1 mg Oral  Daily   lamoTRIgine   150 mg Oral BID   levETIRAcetam   750 mg Oral BID   mirabegron  ER  50 mg Oral Daily   propranolol   5 mg Oral BID   sertraline   200 mg Oral Daily   tamsulosin   0.4 mg Oral QPC supper   traZODone   50 mg Oral QHS   Continuous Infusions:  cefTRIAXone  (ROCEPHIN )  IV Stopped (10/24/23 0920)   metronidazole  Stopped (10/24/23 1350)     LOS: 3 days     Devaughn KATHEE Ban, MD [facility]   If 7PM-7AM, please contact night-coverage [email or web address] Password [email or web address] 10/24/2023, 3:55 PM

## 2023-10-24 NOTE — Plan of Care (Signed)

## 2023-10-25 DIAGNOSIS — J189 Pneumonia, unspecified organism: Secondary | ICD-10-CM | POA: Diagnosis not present

## 2023-10-25 LAB — CBC
HCT: 36.1 % — ABNORMAL LOW (ref 39.0–52.0)
Hemoglobin: 12 g/dL — ABNORMAL LOW (ref 13.0–17.0)
MCH: 32.5 pg (ref 26.0–34.0)
MCHC: 33.2 g/dL (ref 30.0–36.0)
MCV: 97.8 fL (ref 80.0–100.0)
Platelets: 169 K/uL (ref 150–400)
RBC: 3.69 MIL/uL — ABNORMAL LOW (ref 4.22–5.81)
RDW: 11.9 % (ref 11.5–15.5)
WBC: 6.5 K/uL (ref 4.0–10.5)
nRBC: 0 % (ref 0.0–0.2)

## 2023-10-25 LAB — MAGNESIUM: Magnesium: 2 mg/dL (ref 1.7–2.4)

## 2023-10-25 LAB — BASIC METABOLIC PANEL WITH GFR
Anion gap: 6 (ref 5–15)
BUN: 21 mg/dL (ref 8–23)
CO2: 27 mmol/L (ref 22–32)
Calcium: 8.6 mg/dL — ABNORMAL LOW (ref 8.9–10.3)
Chloride: 112 mmol/L — ABNORMAL HIGH (ref 98–111)
Creatinine, Ser: 0.84 mg/dL (ref 0.61–1.24)
GFR, Estimated: >60 mL/min (ref >60)
Glucose, Bld: 108 mg/dL — ABNORMAL HIGH (ref 70–99)
Potassium: 3.2 mmol/L — ABNORMAL LOW (ref 3.5–5.1)
Sodium: 145 mmol/L (ref 135–145)

## 2023-10-25 MED ORDER — POTASSIUM CHLORIDE 20 MEQ PO PACK
60.0000 meq | PACK | Freq: Once | ORAL | Status: AC
  Administered 2023-10-25: 60 meq via ORAL
  Filled 2023-10-25: qty 3

## 2023-10-25 MED ORDER — CLOPIDOGREL BISULFATE 75 MG PO TABS
75.0000 mg | ORAL_TABLET | Freq: Every day | ORAL | Status: DC
  Administered 2023-10-26: 75 mg via ORAL
  Filled 2023-10-25: qty 1

## 2023-10-25 NOTE — TOC Progression Note (Signed)
 Transition of Care [facility]) - Progression Note    Patient Details  Name: Ian Gill MRN: [id number] Date of Birth: 06-20-1945  [facility] CM/SW Contact  [place] Brennen [place], [place] Phone Number: 10/25/2023, 2:03 PM  Clinical Narrative:      CSW followed up with [facility]. CSW spoke with Sammy who advised that the facility has not completed the appropriate documentation on their end to complete discharge. Sammy also advised that the facility does not have transportation today.   Plan for discharge tomorrow during regular business hours. Medical team notified.                    Expected Discharge Plan and Services                                               Social Drivers of Health (SDOH) Interventions SDOH Screenings   Food Insecurity: No Food Insecurity (10/21/2023)  Housing: Low Risk  (10/21/2023)  Transportation Needs: No Transportation Needs (10/21/2023)  Utilities: Not At Risk (10/21/2023)  Financial Resource Strain: Medium Risk (06/10/2023)   Received from [facility]  Social Connections: Socially Isolated (10/21/2023)  Tobacco Use: Medium Risk (10/21/2023)    Readmission Risk Interventions     No data to display

## 2023-10-25 NOTE — Plan of Care (Signed)
  Problem: Education: Goal: Knowledge of General Education information will improve Description: Including pain rating scale, medication(s)/side effects and non-pharmacologic comfort measures Outcome: Progressing   Problem: Health Behavior/Discharge Planning: Goal: Ability to manage health-related needs will improve Outcome: Progressing   Problem: Clinical Measurements: Goal: Ability to maintain clinical measurements within normal limits will improve Outcome: Progressing Goal: Will remain free from infection Outcome: Progressing Goal: Diagnostic test results will improve Outcome: Progressing Goal: Respiratory complications will improve Outcome: Progressing   Problem: Activity: Goal: Risk for activity intolerance will decrease Outcome: Progressing   Problem: Nutrition: Goal: Adequate nutrition will be maintained Outcome: Progressing   Problem: Coping: Goal: Level of anxiety will decrease Outcome: Progressing   Problem: Elimination: Goal: Will not experience complications related to bowel motility Outcome: Progressing Goal: Will not experience complications related to urinary retention Outcome: Progressing   Problem: Pain Managment: Goal: General experience of comfort will improve and/or be controlled Outcome: Progressing   Problem: Safety: Goal: Ability to remain free from injury will improve Outcome: Progressing   Problem: Skin Integrity: Goal: Risk for impaired skin integrity will decrease Outcome: Progressing   Problem: Activity: Goal: Ability to tolerate increased activity will improve Outcome: Progressing   Problem: Clinical Measurements: Goal: Ability to maintain a body temperature in the normal range will improve Outcome: Progressing   Problem: Respiratory: Goal: Ability to maintain adequate ventilation will improve Outcome: Progressing Goal: Ability to maintain a clear airway will improve Outcome: Progressing

## 2023-10-25 NOTE — Progress Notes (Signed)
 PROGRESS NOTE    Ian Gill  FMW:[id number] DOB: 02-Dec-1945 DOA: 10/21/2023 PCP: Lenon Layman ORN, MD  Outpatient Specialists: neurology    Brief Narrative:   From admission h and p   Ian Gill is a pleasant 78 y.o. male with medical history significant for Parkinson's disease, COPD, history of seizure disorder on antiseizure medication, anxiety/depression, history of stroke who was brought in to [facility] ED for altered mental status.  Patient had productive cough with yellow sputum no blood.  Patient had a fall at the facility yesterday with possible mild head injury but did not seek care at that time.  There is no complaint of fever, chills, nausea, vomiting, dysuria.  Patient has dementia and his history is limited due to his inability to provide accurate history.  Patient's son was at bedside he lives in [place] and he did not know the detail.   Assessment & Plan:   Principal Problem:   CAP (community acquired pneumonia) Active Problems:   Seizure (HCC)   COPD (chronic obstructive pulmonary disease) (HCC)   Stroke (HCC)   HLD (hyperlipidemia)   GERD (gastroesophageal reflux disease)   HTN (hypertension)   Acute metabolic encephalopathy   Alcohol dependence (HCC)   BPH (benign prostatic hyperplasia)   OSA (obstructive sleep apnea)   Parkinson disease (HCC)  # CAP, aspiration With fever, LLL infiltrate, cough, hypoxia. Covid neg, strep pneumo antigen neg. Improving. Rvp neg. Now s/p 5 days ceftriaxone /azithromycin /flagyl . Resolved.  # Acute hypoxic respiratory failure Weaned to room air 9/27  # Neck pain Acute on chronic, CT neck shows degenerative disease, nothing acute. Improving.  # Dysphagia SLP following, cleared for dysphagia 1 diet currently  # Debility # Fall Resides in assited living at [place]. Unwitnessed fall few days ago, ct head/neck neg, no apparent injury - PT advising snf, per TOC [facility] has accepted for snf but no response from them  past two days  # Acute metabolic encephalopathy May be secondary to infection, may also be post-ictal as son thinks acting like he normally does after seizure. Could have both - infection lowers seizure threshold. Improving.  # Parkinson's disease - home sinemet   # History CVA - home statin - resume home plavix   # Hypertension Bp controlled - home lisinopril , amlodipine   # Seizure disorder Seizure concern as above. Follows w/ neurology. Did get keppra  load in ER - seizure precautions - home lamictal , keppra , neuro advises no changes to meds  # GAD - home sertraline   # BPH - flomax  for home rapaflo  - home myrbetriq   DVT prophylaxis: lovenox  Code Status: dnr/dni Family Communication: son Ian Gill updated at bedside 9/28  Level of care: Telemetry Medical Status is: Inpatient Remains inpatient appropriate because: pending snf    Consultants:  none  Procedures: none  Antimicrobials:  S/p Ceftriaxone /azithromycin /flagyl    Subjective:  Improving neck pain, no cough, no dyspnea  Objective: Vitals:   10/24/23 2010 10/25/23 0017 10/25/23 0407 10/25/23 0910  BP: 123/67 113/64 136/77 (!) 156/99  Pulse: (!) 107 83 83 90  Resp:    17  Temp: 98.1 F (36.7 C) 98.6 F (37 C) 98.3 F (36.8 C) 97.9 F (36.6 C)  TempSrc: Oral Oral Oral   SpO2: 94% 90% 96% 97%  Weight:      Height:        Intake/Output Summary (Last 24 hours) at 10/25/2023 1359 Last data filed at 10/25/2023 0900 Gross per 24 hour  Intake 360 ml  Output 200 ml  Net 160  ml   Filed Weights   10/21/23 0917  Weight: 97.5 kg    Examination:  General exam: Appears calm and comfortable  Respiratory system: rales left lung improved Cardiovascular system: S1 & S2 heard, RRR.   Gastrointestinal system: Abdomen is nondistended, soft and nontender.   Central nervous system: aaox4, moves all r Extremities: warm, no edema Skin: No visible rashes, lesions or ulcers Psychiatry: calm    Data Reviewed:  I have personally reviewed following labs and imaging studies  CBC: Recent Labs  Lab 10/21/23 0936 10/22/23 0521 10/23/23 0507 10/24/23 0604 10/25/23 0442  WBC 14.7* 16.2* 11.5* 8.7 6.5  HGB 15.4 13.5 13.0 12.5* 12.0*  HCT 45.4 39.9 39.0 36.1* 36.1*  MCV 97.6 99.0 99.2 96.3 97.8  PLT 223 165 143* 149* 169   Basic Metabolic Panel: Recent Labs  Lab 10/21/23 0936 10/22/23 0521 10/23/23 0641 10/24/23 0604 10/25/23 0442  NA 142 140 144 144 145  K 3.9 3.6 3.5 3.4* 3.2*  CL 103 105 109 111 112*  CO2 26 26 23 24 27   GLUCOSE 121* 100* 86 107* 108*  BUN 15 20 21 20 21   CREATININE 1.15 1.08 1.16 0.80 0.84  CALCIUM  9.7 9.1 8.6* 8.8* 8.6*  MG  --   --   --   --  2.0   GFR: Estimated Creatinine Clearance: 84.9 mL/min (by C-G formula based on SCr of 0.84 mg/dL). Liver Function Tests: Recent Labs  Lab 10/21/23 0936 10/22/23 0521  AST 51* 42*  ALT 7 6  ALKPHOS 67 57  BILITOT 1.1 1.4*  PROT 7.2 6.6  ALBUMIN 4.4 3.8   No results for input(s): LIPASE, AMYLASE in the last 168 hours. No results for input(s): AMMONIA in the last 168 hours. Coagulation Profile: Recent Labs  Lab 10/21/23 0936 10/22/23 0521  INR 1.1 1.4*   Cardiac Enzymes: No results for input(s): CKTOTAL, CKMB, CKMBINDEX, TROPONINI in the last 168 hours. BNP (last 3 results) No results for input(s): PROBNP in the last 8760 hours. HbA1C: No results for input(s): HGBA1C in the last 72 hours. CBG: No results for input(s): GLUCAP in the last 168 hours. Lipid Profile: No results for input(s): CHOL, HDL, LDLCALC, TRIG, CHOLHDL, LDLDIRECT in the last 72 hours. Thyroid  Function Tests: No results for input(s): TSH, T4TOTAL, FREET4, T3FREE, THYROIDAB in the last 72 hours. Anemia Panel: No results for input(s): VITAMINB12, FOLATE, FERRITIN, TIBC, IRON, RETICCTPCT in the last 72 hours. Urine analysis:    Component Value Date/Time   COLORURINE YELLOW (A)  10/21/2023 0936   APPEARANCEUR HAZY (A) 10/21/2023 0936   APPEARANCEUR Clear 04/01/2021 1333   LABSPEC 1.020 10/21/2023 0936   LABSPEC 1.004 02/04/2014 1838   PHURINE 5.0 10/21/2023 0936   GLUCOSEU NEGATIVE 10/21/2023 0936   GLUCOSEU Negative 02/04/2014 1838   HGBUR NEGATIVE 10/21/2023 0936   BILIRUBINUR NEGATIVE 10/21/2023 0936   BILIRUBINUR Negative 04/01/2021 1333   BILIRUBINUR Negative 02/04/2014 1838   KETONESUR 5 (A) 10/21/2023 0936   PROTEINUR 30 (A) 10/21/2023 0936   NITRITE NEGATIVE 10/21/2023 0936   LEUKOCYTESUR NEGATIVE 10/21/2023 0936   LEUKOCYTESUR Negative 02/04/2014 1838   Sepsis Labs: @LABRCNTIP (procalcitonin:4,lacticidven:4)  ) Recent Results (from the past 240 hours)  Resp panel by RT-PCR (RSV, Flu A&B, Covid) Anterior Nasal Swab     Status: None   Collection Time: 10/21/23  9:36 AM   Specimen: Anterior Nasal Swab  Result Value Ref Range Status   SARS Coronavirus 2 by RT PCR NEGATIVE NEGATIVE Final  Comment: (NOTE) SARS-CoV-2 target nucleic acids are NOT DETECTED.  The SARS-CoV-2 RNA is generally detectable in upper respiratory specimens during the acute phase of infection. The lowest concentration of SARS-CoV-2 viral copies this assay can detect is 138 copies/mL. A negative result does not preclude SARS-Cov-2 infection and should not be used as the sole basis for treatment or other patient management decisions. A negative result may occur with  improper specimen collection/handling, submission of specimen other than nasopharyngeal swab, presence of viral mutation(s) within the areas targeted by this assay, and inadequate number of viral copies(<138 copies/mL). A negative result must be combined with clinical observations, patient history, and epidemiological information. The expected result is Negative.  Fact Sheet for Patients:  BloggerCourse.com  Fact Sheet for Healthcare Providers:   SeriousBroker.it  This test is no t yet approved or cleared by the [place]  FDA and  has been authorized for detection and/or diagnosis of SARS-CoV-2 by FDA under an Emergency Use Authorization (EUA). This EUA will remain  in effect (meaning this test can be used) for the duration of the COVID-19 declaration under Section 564(b)(1) of the Act, 21 U.S.C.section 360bbb-3(b)(1), unless the authorization is terminated  or revoked sooner.       Influenza A by PCR NEGATIVE NEGATIVE Final   Influenza B by PCR NEGATIVE NEGATIVE Final    Comment: (NOTE) The Xpert Xpress SARS-CoV-2/FLU/RSV plus assay is intended as an aid in the diagnosis of influenza from Nasopharyngeal swab specimens and should not be used as a sole basis for treatment. Nasal washings and aspirates are unacceptable for Xpert Xpress SARS-CoV-2/FLU/RSV testing.  Fact Sheet for Patients: BloggerCourse.com  Fact Sheet for Healthcare Providers: SeriousBroker.it  This test is not yet approved or cleared by the [place]  FDA and has been authorized for detection and/or diagnosis of SARS-CoV-2 by FDA under an Emergency Use Authorization (EUA). This EUA will remain in effect (meaning this test can be used) for the duration of the COVID-19 declaration under Section 564(b)(1) of the Act, 21 U.S.C. section 360bbb-3(b)(1), unless the authorization is terminated or revoked.     Resp Syncytial Virus by PCR NEGATIVE NEGATIVE Final    Comment: (NOTE) Fact Sheet for Patients: BloggerCourse.com  Fact Sheet for Healthcare Providers: SeriousBroker.it  This test is not yet approved or cleared by the [place]  FDA and has been authorized for detection and/or diagnosis of SARS-CoV-2 by FDA under an Emergency Use Authorization (EUA). This EUA will remain in effect (meaning this test can be used) for  the duration of the COVID-19 declaration under Section 564(b)(1) of the Act, 21 U.S.C. section 360bbb-3(b)(1), unless the authorization is terminated or revoked.  Performed at [facility], [address] [place], [place] [address]   Blood Culture (routine x 2)     Status: None (Preliminary result)   Collection Time: 10/21/23  9:36 AM   Specimen: BLOOD  Result Value Ref Range Status   Specimen Description BLOOD BLOOD RIGHT HAND  Final   Special Requests   Final    BOTTLES DRAWN AEROBIC AND ANAEROBIC Blood Culture adequate volume   Culture   Final    NO GROWTH 4 DAYS Performed at [facility], [address] [place], [place] [address]    Report Status PENDING  Incomplete  Blood Culture (routine x 2)     Status: None (Preliminary result)   Collection Time: 10/21/23  9:36 AM   Specimen: BLOOD  Result Value Ref Range Status   Specimen Description BLOOD BLOOD  LEFT FOREARM  Final   Special Requests   Final    BOTTLES DRAWN AEROBIC AND ANAEROBIC Blood Culture adequate volume   Culture   Final    NO GROWTH 4 DAYS Performed at [facility], [address] [place], [place] [address]    Report Status PENDING  Incomplete  Respiratory (~20 pathogens) panel by PCR     Status: None   Collection Time: 10/22/23 10:44 AM   Specimen: Nasopharyngeal Swab; Respiratory  Result Value Ref Range Status   Adenovirus NOT DETECTED NOT DETECTED Final   Coronavirus 229E NOT DETECTED NOT DETECTED Final    Comment: (NOTE) The Coronavirus on the Respiratory Panel, DOES NOT test for the novel  Coronavirus (2019 nCoV)    Coronavirus HKU1 NOT DETECTED NOT DETECTED Final   Coronavirus NL63 NOT DETECTED NOT DETECTED Final   Coronavirus OC43 NOT DETECTED NOT DETECTED Final   Metapneumovirus NOT DETECTED NOT DETECTED Final   Rhinovirus / Enterovirus NOT DETECTED NOT DETECTED Final   Influenza A NOT DETECTED NOT DETECTED Final   Influenza B NOT DETECTED NOT DETECTED Final    Parainfluenza Virus 1 NOT DETECTED NOT DETECTED Final   Parainfluenza Virus 2 NOT DETECTED NOT DETECTED Final   Parainfluenza Virus 3 NOT DETECTED NOT DETECTED Final   Parainfluenza Virus 4 NOT DETECTED NOT DETECTED Final   Respiratory Syncytial Virus NOT DETECTED NOT DETECTED Final   Bordetella pertussis NOT DETECTED NOT DETECTED Final   Bordetella Parapertussis NOT DETECTED NOT DETECTED Final   Chlamydophila pneumoniae NOT DETECTED NOT DETECTED Final   Mycoplasma pneumoniae NOT DETECTED NOT DETECTED Final    Comment: Performed at [facility], [address] [place], [place] [address]         Radiology Studies: CT CERVICAL SPINE WO CONTRAST Result Date: 10/24/2023 EXAM: CT CERVICAL SPINE WITHOUT CONTRAST 10/24/2023 02:20:08 PM TECHNIQUE: CT of the cervical spine was performed without the administration of intravenous contrast. Multiplanar reformatted images are provided for review. Automated exposure control, iterative reconstruction, and/or weight based adjustment of the mA/kV was utilized to reduce the radiation dose to as low as reasonably achievable. COMPARISON: None available. CLINICAL HISTORY: Neck pain. Recent unwitnessed fall, patient complains of neck pain. Repeat portion of c-spine due to breathing motion. FINDINGS: CERVICAL SPINE: BONES AND ALIGNMENT: ACDF noted at C3-C4. Straightening of the normal cervical lordosis is present. Slight degenerative anterolisthesis is present at C4-C5 and C5-C6. No acute fracture or traumatic malalignment. DEGENERATIVE CHANGES: Chronic loss of disc height and endplate change with uncovertebral spurring results in moderate foraminal stenosis bilaterally at C6-C7. Severe right foraminal narrowing is present at C4-C5. Moderate-to-severe left foraminal narrowing is present at C5-C6. SOFT TISSUES: No prevertebral soft tissue swelling. Atherosclerotic calcifications are present in the proximal left subclavian artery without significant stenosis.  Calcifications are present at the carotid bifurcations bilaterally without definite stenosis. IMPRESSION: 1. No acute abnormality of the cervical spine related to the recent unwitnessed fall. 2. Straightening of the normal cervical lordosis. 3. Slight degenerative anterolisthesis at C4-5 and C5-6. 4. Moderate foraminal stenosis bilaterally at C6-7. 5. Severe right foraminal narrowing at C4-5. 6. Moderate-to-severe left foraminal narrowing at C5-6. Electronically signed by: Lonni Necessary MD 10/24/2023 02:28 PM EDT RP Workstation: [id number]        Scheduled Meds:  atorvastatin   20 mg Oral QHS   carbidopa -levodopa   3 tablet Oral TID   donepezil   5 mg Oral QHS   enoxaparin  (LOVENOX ) injection  50 mg Subcutaneous Q24H  folic acid   1 mg Oral Daily   lamoTRIgine   150 mg Oral BID   levETIRAcetam   750 mg Oral BID   mirabegron  ER  50 mg Oral Daily   potassium chloride  60 mEq Oral Once   propranolol   5 mg Oral BID   sertraline   200 mg Oral Daily   tamsulosin   0.4 mg Oral QPC supper   traZODone   50 mg Oral QHS   Continuous Infusions:     LOS: 4 days     Devaughn KATHEE Ban, MD [facility]   If 7PM-7AM, please contact night-coverage [email or web address] Password [facility] 10/25/2023, 1:59 PM

## 2023-10-26 DIAGNOSIS — J189 Pneumonia, unspecified organism: Secondary | ICD-10-CM | POA: Diagnosis not present

## 2023-10-26 LAB — CULTURE, BLOOD (ROUTINE X 2)
Culture: NO GROWTH
Culture: NO GROWTH
Special Requests: ADEQUATE
Special Requests: ADEQUATE

## 2023-10-26 LAB — BASIC METABOLIC PANEL WITH GFR
Anion gap: 8 (ref 5–15)
BUN: 17 mg/dL (ref 8–23)
CO2: 26 mmol/L (ref 22–32)
Calcium: 8.8 mg/dL — ABNORMAL LOW (ref 8.9–10.3)
Chloride: 111 mmol/L (ref 98–111)
Creatinine, Ser: 0.78 mg/dL (ref 0.61–1.24)
GFR, Estimated: >60 mL/min (ref >60)
Glucose, Bld: 111 mg/dL — ABNORMAL HIGH (ref 70–99)
Potassium: 3.6 mmol/L (ref 3.5–5.1)
Sodium: 145 mmol/L (ref 135–145)

## 2023-10-26 NOTE — Discharge Instructions (Signed)

## 2023-10-26 NOTE — Plan of Care (Signed)
  Problem: Education: Goal: Knowledge of General Education information will improve Description: Including pain rating scale, medication(s)/side effects and non-pharmacologic comfort measures Outcome: Progressing   Problem: Health Behavior/Discharge Planning: Goal: Ability to manage health-related needs will improve Outcome: Progressing   Problem: Clinical Measurements: Goal: Ability to maintain clinical measurements within normal limits will improve Outcome: Progressing Goal: Will remain free from infection Outcome: Progressing Goal: Diagnostic test results will improve Outcome: Progressing Goal: Respiratory complications will improve Outcome: Progressing   Problem: Activity: Goal: Risk for activity intolerance will decrease Outcome: Progressing   Problem: Nutrition: Goal: Adequate nutrition will be maintained Outcome: Progressing   Problem: Coping: Goal: Level of anxiety will decrease Outcome: Progressing   Problem: Elimination: Goal: Will not experience complications related to bowel motility Outcome: Progressing Goal: Will not experience complications related to urinary retention Outcome: Progressing   Problem: Pain Managment: Goal: General experience of comfort will improve and/or be controlled Outcome: Progressing   Problem: Safety: Goal: Ability to remain free from injury will improve Outcome: Progressing   Problem: Skin Integrity: Goal: Risk for impaired skin integrity will decrease Outcome: Progressing   Problem: Activity: Goal: Ability to tolerate increased activity will improve Outcome: Progressing   Problem: Clinical Measurements: Goal: Ability to maintain a body temperature in the normal range will improve Outcome: Progressing   Problem: Respiratory: Goal: Ability to maintain adequate ventilation will improve Outcome: Progressing Goal: Ability to maintain a clear airway will improve Outcome: Progressing

## 2023-10-26 NOTE — TOC Progression Note (Signed)
 Transition of Care [facility]) - Progression Note    Patient Details  Name: Ian Gill MRN: [id number] Date of Birth: 1945-03-30  [facility] CM/SW Contact  Lauraine [name], LCSW Phone Number: 10/26/2023, 9:46 AM  Clinical Narrative:   [facility] approved: [id number]. [facility]F can accept patient today and will need discharge summary before 1:30. Sent secure chat to MD and RN to notify.  Expected Discharge Plan and Services                                               Social Drivers of Health (SDOH) Interventions SDOH Screenings   Food Insecurity: No Food Insecurity (10/21/2023)  Housing: Low Risk  (10/21/2023)  Transportation Needs: No Transportation Needs (10/21/2023)  Utilities: Not At Risk (10/21/2023)  Financial Resource Strain: Medium Risk (06/10/2023)   Received from [facility]  Social Connections: Socially Isolated (10/21/2023)  Tobacco Use: Medium Risk (10/21/2023)    Readmission Risk Interventions     No data to display

## 2023-10-26 NOTE — Discharge Summary (Addendum)
 Ian Gill Ian Gill:[id number] DOB: [birth year] DOA: 10/21/2023  PCP: Lenon Layman ORN, MD  Admit date: 10/21/2023 Discharge date: 10/26/2023  Time spent: 35 minutes  Recommendations for Outpatient Follow-up:  Pcp and neurology f/u     Discharge Diagnoses:  Principal Problem:   CAP (community acquired pneumonia) Active Problems:   Seizure (HCC)   COPD (chronic obstructive pulmonary disease) (HCC)   Stroke (HCC)   HLD (hyperlipidemia)   GERD (gastroesophageal reflux disease)   HTN (hypertension)   Acute metabolic encephalopathy   Alcohol dependence (HCC)   BPH (benign prostatic hyperplasia)   OSA (obstructive sleep apnea)   Parkinson disease (HCC)   Discharge Condition: stable  Diet recommendation: heart healthy dysphagia 1  Filed Weights   10/21/23 0917  Weight: 97.5 kg    History of present illness:  From admission h and p  Ian Gill is a pleasant 78 y.o. male with medical history significant for Parkinson's disease, COPD, history of seizure disorder on antiseizure medication, anxiety/depression, history of stroke who was brought in to [facility] ED for altered mental status.  Patient had productive cough with yellow sputum no blood.  Patient had a fall at the facility yesterday with possible mild head injury but did not seek care at that time.  There is no complaint of fever, chills, nausea, vomiting, dysuria.  Patient has dementia and his history is limited due to his inability to provide accurate history.  Patient's son was at bedside he lives in [place] and he did not know the detail.   Hospital Course:   # CAP, aspiration With fever, LLL infiltrate, cough, hypoxia. Covid neg, strep pneumo antigen neg. Improving. Rvp neg. Now s/p 5 days ceftriaxone /azithromycin /flagyl . Resolved. D/c plan reviewed with son brent who is in agreement.   # Acute hypoxic respiratory failure Weaned to room air 9/27, breathing comfortably   # Neck pain Acute on chronic, CT neck  shows degenerative disease, nothing acute. Improving.   # Dysphagia SLP following, cleared for dysphagia 1 diet currently   # Debility # Fall Resides in assited living at [facility]. Unwitnessed fall few days ago, ct head/neck neg, no apparent injury - accepted to [facility]   # Acute metabolic encephalopathy May be secondary to infection, may also be post-ictal as son thinks acting like he normally does after seizure. Could have both - infection lowers seizure threshold. Much improved.   # Parkinson's disease stable - home sinemet    # History CVA   # Hypertension Bp controlled - home lisinopril , amlodipine    # Seizure disorder Seizure concern as above. Follows w/ neurology. Did get keppra  load in ER - home lamictal , keppra , neuro advises no changes to meds - f/u neurology   # GAD - home sertraline    # BPH - flomax  for home rapaflo  - home myrbetriq   Procedures: none   Consultations: none  Discharge Exam: Vitals:   10/26/23 0359 10/26/23 0810  BP: (!) 143/90 (!) 148/86  Pulse: 95   Resp:  18  Temp: 98.9 F (37.2 C) 98.2 F (36.8 C)  SpO2: 94% 94%    General exam: Appears calm and comfortable  Respiratory system: rales left lung improved Cardiovascular system: S1 & S2 heard, RRR.   Gastrointestinal system: Abdomen is nondistended, soft and nontender.   Central nervous system: aaox4, moves all r Extremities: warm, no edema Skin: No visible rashes, lesions or ulcers Psychiatry: calm    Discharge Instructions   Discharge Instructions     Diet - low sodium  heart healthy   Complete by: As directed    Dysphagia 1   Increase activity slowly   Complete by: As directed       Allergies as of 10/26/2023       Reactions   Bupropion Other (See Comments)   Seizures Seizure seizures Seizure   Dilantin  [phenytoin  Sodium Extended]    Past issues with toxicity per pt's son   Iodinated Contrast Media Swelling        Medication List     STOP  taking these medications    furosemide  20 MG tablet Commonly known as: Lasix        TAKE these medications    amLODipine  10 MG tablet Commonly known as: NORVASC  Take 10 mg by mouth daily.   atorvastatin  20 MG tablet Commonly known as: LIPITOR Take 1 tablet (20 mg total) by mouth daily.   carbidopa -levodopa  25-100 MG tablet Commonly known as: SINEMET  IR Take 3 tablets by mouth 3 (three) times daily.   Cerovite Senior Tabs Take by mouth. 0.4 mg-300 mg-250 mg   Cholecalciferol  125 MCG (5000 UT) capsule Take 5,000 Units by mouth daily.   clopidogrel  75 MG tablet Commonly known as: PLAVIX  Take 1 tablet (75 mg total) by mouth daily.   donepezil  5 MG tablet Commonly known as: ARICEPT  Take 5 mg by mouth daily.   folic acid  1 MG tablet Commonly known as: FOLVITE  Take 1 tablet (1 mg total) by mouth daily.   ibuprofen 200 MG tablet Commonly known as: ADVIL Take 200 mg by mouth every 8 (eight) hours as needed.   lamoTRIgine  150 MG tablet Commonly known as: LAMICTAL  Take 1 tablet by mouth 2 (two) times daily.   levETIRAcetam  750 MG tablet Commonly known as: KEPPRA  Take 750 mg by mouth 2 (two) times daily.   lisinopril  20 MG tablet Commonly known as: ZESTRIL  Take 20 mg by mouth daily.   loperamide 2 MG capsule Commonly known as: IMODIUM Take by mouth.  Take 2 mg by mouth 4 (four) times daily as needed for Diarrhea   mirabegron  ER 50 MG Tb24 tablet Commonly known as: MYRBETRIQ  Take 1 tablet (50 mg total) by mouth daily.   propranolol  10 MG tablet Commonly known as: INDERAL  Take 0.5 tablets by mouth 2 (two) times daily. What changed: Another medication with the same name was removed. Continue taking this medication, and follow the directions you see here.   RA Fish Oil 1000 MG Caps Take 1 capsule by mouth daily.   sertraline  100 MG tablet Commonly known as: ZOLOFT  Take 200 mg by mouth daily.   silodosin  8 MG Caps capsule Commonly known as: RAPAFLO  Take 1  capsule (8 mg total) by mouth daily with breakfast.   traZODone  50 MG tablet Commonly known as: DESYREL  Take 50 mg by mouth at bedtime.       Allergies  Allergen Reactions   Bupropion Other (See Comments)    Seizures  Seizure seizures Seizure    Dilantin  [Phenytoin  Sodium Extended]     Past issues with toxicity per pt's son   Iodinated Contrast Media Swelling    Contact information for follow-up providers     Lenon Layman ORN, MD Follow up.   Specialty: Internal Medicine Why: hospital follow up Contact information: [address] [facility] [place] [address] [phone]         Maree Jannett POUR, MD Follow up.   Specialty: Neurology Contact information: [address] [facility]  [place] [address] [phone]              Contact information for after-discharge care     Destination     [facility] .   Service: Skilled Nursing Contact information: [address] [place]  [address] [phone]                      The results of significant diagnostics from this hospitalization (including imaging, microbiology, ancillary and laboratory) are listed below for reference.    Significant Diagnostic Studies: CT CERVICAL SPINE WO CONTRAST Result Date: 10/24/2023 EXAM: CT CERVICAL SPINE WITHOUT CONTRAST 10/24/2023 02:20:08 PM TECHNIQUE: CT of the cervical spine was performed without the administration of intravenous contrast. Multiplanar reformatted images are provided for review. Automated exposure control, iterative reconstruction, and/or weight based adjustment of the mA/kV was utilized to reduce the radiation dose to as low as reasonably achievable. COMPARISON: None available. CLINICAL HISTORY: Neck pain. Recent unwitnessed fall, patient complains of neck pain. Repeat portion of c-spine due to breathing motion. FINDINGS: CERVICAL SPINE: BONES AND ALIGNMENT: ACDF  noted at C3-C4. Straightening of the normal cervical lordosis is present. Slight degenerative anterolisthesis is present at C4-C5 and C5-C6. No acute fracture or traumatic malalignment. DEGENERATIVE CHANGES: Chronic loss of disc height and endplate change with uncovertebral spurring results in moderate foraminal stenosis bilaterally at C6-C7. Severe right foraminal narrowing is present at C4-C5. Moderate-to-severe left foraminal narrowing is present at C5-C6. SOFT TISSUES: No prevertebral soft tissue swelling. Atherosclerotic calcifications are present in the proximal left subclavian artery without significant stenosis. Calcifications are present at the carotid bifurcations bilaterally without definite stenosis. IMPRESSION: 1. No acute abnormality of the cervical spine related to the recent unwitnessed fall. 2. Straightening of the normal cervical lordosis. 3. Slight degenerative anterolisthesis at C4-5 and C5-6. 4. Moderate foraminal stenosis bilaterally at C6-7. 5. Severe right foraminal narrowing at C4-5. 6. Moderate-to-severe left foraminal narrowing at C5-6. Electronically signed by: Lonni Necessary MD 10/24/2023 02:28 PM EDT RP Workstation: [id number]   CT HEAD WO CONTRAST ( ) Result Date: 10/21/2023 CLINICAL DATA:  Provided history: Ataxia, acute, traumatic. Additional history provided: Fall. Altered. EXAM: CT HEAD WITHOUT CONTRAST TECHNIQUE: Contiguous axial images were obtained from the base of the skull through the vertex without intravenous contrast. RADIATION DOSE REDUCTION: This exam was performed according to the departmental dose-optimization program which includes automated exposure control, adjustment of the mA and/or kV according to patient size and/or use of iterative reconstruction technique. COMPARISON:  Head CT 06/05/2023. FINDINGS: Brain: Generalized cerebral atrophy. There is no acute intracranial hemorrhage. No demarcated cortical infarct. No extra-axial fluid collection. No  evidence of an intracranial mass. No midline shift. Vascular: No hyperdense vessel.  Atherosclerotic calcifications. Skull: No calvarial fracture or aggressive osseous lesion. Sinuses/Orbits: No mass or acute finding within the imaged orbits. Small mucous retention cyst within the left maxillary sinus. Other: Small foci of subcutaneous emphysema along the nasal bridge. IMPRESSION: 1. No evidence of an acute intracranial abnormality. 2. Generalized cerebral atrophy. 3. Small foci of subcutaneous emphysema along the nasal bridge. Correlate with the physical exam findings for a laceration at this site. 4. Small left maxillary sinus mucous retention cyst. Electronically Signed   By: Rockey Childs D.O.   On: 10/21/2023 13:50   DG Chest Port 1 View Result Date: 10/21/2023 CLINICAL DATA:  Questionable sepsis. Unwitnessed fall. Hematoma above right eye. Decreased O2 sats. EXAM: PORTABLE CHEST 1 VIEW COMPARISON:  06/04/2023. FINDINGS: Trachea is midline. Heart is  enlarged, stable. Thoracic aorta is calcified. Streaky densities in the lingula and left lower lobe. No dense airspace consolidation. No pleural fluid. Right shoulder arthroplasty. Left shoulder degenerative changes. Old lateral left clavicle fracture. IMPRESSION: Lingular and left lower lobe streaky densities may be due to atelectasis. Difficult to exclude aspiration or bronchopneumonia. Electronically Signed   By: Newell Eke M.D.   On: 10/21/2023 10:32    Microbiology: Recent Results (from the past 240 hours)  Resp panel by RT-PCR (RSV, Flu A&B, Covid) Anterior Nasal Swab     Status: None   Collection Time: 10/21/23  9:36 AM   Specimen: Anterior Nasal Swab  Result Value Ref Range Status   SARS Coronavirus 2 by RT PCR NEGATIVE NEGATIVE Final    Comment: (NOTE) SARS-CoV-2 target nucleic acids are NOT DETECTED.  The SARS-CoV-2 RNA is generally detectable in upper respiratory specimens during the acute phase of infection. The lowest concentration  of SARS-CoV-2 viral copies this assay can detect is 138 copies/mL. A negative result does not preclude SARS-Cov-2 infection and should not be used as the sole basis for treatment or other patient management decisions. A negative result may occur with  improper specimen collection/handling, submission of specimen other than nasopharyngeal swab, presence of viral mutation(s) within the areas targeted by this assay, and inadequate number of viral copies(<138 copies/mL). A negative result must be combined with clinical observations, patient history, and epidemiological information. The expected result is Negative.  Fact Sheet for Patients:  BloggerCourse.com  Fact Sheet for Healthcare Providers:  SeriousBroker.it  This test is no t yet approved or cleared by the [place]  FDA and  has been authorized for detection and/or diagnosis of SARS-CoV-2 by FDA under an Emergency Use Authorization (EUA). This EUA will remain  in effect (meaning this test can be used) for the duration of the COVID-19 declaration under Section 564(b)(1) of the Act, 21 U.S.C.section 360bbb-3(b)(1), unless the authorization is terminated  or revoked sooner.       Influenza A by PCR NEGATIVE NEGATIVE Final   Influenza B by PCR NEGATIVE NEGATIVE Final    Comment: (NOTE) The Xpert Xpress SARS-CoV-2/FLU/RSV plus assay is intended as an aid in the diagnosis of influenza from Nasopharyngeal swab specimens and should not be used as a sole basis for treatment. Nasal washings and aspirates are unacceptable for Xpert Xpress SARS-CoV-2/FLU/RSV testing.  Fact Sheet for Patients: BloggerCourse.com  Fact Sheet for Healthcare Providers: SeriousBroker.it  This test is not yet approved or cleared by the [place]  FDA and has been authorized for detection and/or diagnosis of SARS-CoV-2 by FDA under an Emergency Use  Authorization (EUA). This EUA will remain in effect (meaning this test can be used) for the duration of the COVID-19 declaration under Section 564(b)(1) of the Act, 21 U.S.C. section 360bbb-3(b)(1), unless the authorization is terminated or revoked.     Resp Syncytial Virus by PCR NEGATIVE NEGATIVE Final    Comment: (NOTE) Fact Sheet for Patients: BloggerCourse.com  Fact Sheet for Healthcare Providers: SeriousBroker.it  This test is not yet approved or cleared by the [place]  FDA and has been authorized for detection and/or diagnosis of SARS-CoV-2 by FDA under an Emergency Use Authorization (EUA). This EUA will remain in effect (meaning this test can be used) for the duration of the COVID-19 declaration under Section 564(b)(1) of the Act, 21 U.S.C. section 360bbb-3(b)(1), unless the authorization is terminated or revoked.  Performed at [facility], [address], [place], [place] [address]   Blood  Culture (routine x 2)     Status: None   Collection Time: 10/21/23  9:36 AM   Specimen: BLOOD  Result Value Ref Range Status   Specimen Description BLOOD BLOOD RIGHT HAND  Final   Special Requests   Final    BOTTLES DRAWN AEROBIC AND ANAEROBIC Blood Culture adequate volume   Culture   Final    NO GROWTH 5 DAYS Performed at [facility], [address] [place], [place] [address]    Report Status 10/26/2023 FINAL  Final  Blood Culture (routine x 2)     Status: None   Collection Time: 10/21/23  9:36 AM   Specimen: BLOOD  Result Value Ref Range Status   Specimen Description BLOOD BLOOD LEFT FOREARM  Final   Special Requests   Final    BOTTLES DRAWN AEROBIC AND ANAEROBIC Blood Culture adequate volume   Culture   Final    NO GROWTH 5 DAYS Performed at [facility], [address] [place], [place] [address]    Report Status 10/26/2023 FINAL  Final  Respiratory (~20 pathogens) panel by PCR      Status: None   Collection Time: 10/22/23 10:44 AM   Specimen: Nasopharyngeal Swab; Respiratory  Result Value Ref Range Status   Adenovirus NOT DETECTED NOT DETECTED Final   Coronavirus 229E NOT DETECTED NOT DETECTED Final    Comment: (NOTE) The Coronavirus on the Respiratory Panel, DOES NOT test for the novel  Coronavirus (2019 nCoV)    Coronavirus HKU1 NOT DETECTED NOT DETECTED Final   Coronavirus NL63 NOT DETECTED NOT DETECTED Final   Coronavirus OC43 NOT DETECTED NOT DETECTED Final   Metapneumovirus NOT DETECTED NOT DETECTED Final   Rhinovirus / Enterovirus NOT DETECTED NOT DETECTED Final   Influenza A NOT DETECTED NOT DETECTED Final   Influenza B NOT DETECTED NOT DETECTED Final   Parainfluenza Virus 1 NOT DETECTED NOT DETECTED Final   Parainfluenza Virus 2 NOT DETECTED NOT DETECTED Final   Parainfluenza Virus 3 NOT DETECTED NOT DETECTED Final   Parainfluenza Virus 4 NOT DETECTED NOT DETECTED Final   Respiratory Syncytial Virus NOT DETECTED NOT DETECTED Final   Bordetella pertussis NOT DETECTED NOT DETECTED Final   Bordetella Parapertussis NOT DETECTED NOT DETECTED Final   Chlamydophila pneumoniae NOT DETECTED NOT DETECTED Final   Mycoplasma pneumoniae NOT DETECTED NOT DETECTED Final    Comment: Performed at [facility], [address] [place], [place] [address]     Labs: Basic Metabolic Panel: Recent Labs  Lab 10/22/23 0521 10/23/23 0641 10/24/23 0604 10/25/23 0442 10/26/23 0534  NA 140 144 144 145 145  K 3.6 3.5 3.4* 3.2* 3.6  CL 105 109 111 112* 111  CO2 26 23 24 27 26   GLUCOSE 100* 86 107* 108* 111*  BUN 20 21 20 21 17   CREATININE 1.08 1.16 0.80 0.84 0.78  CALCIUM  9.1 8.6* 8.8* 8.6* 8.8*  MG  --   --   --  2.0  --    Liver Function Tests: Recent Labs  Lab 10/21/23 0936 10/22/23 0521  AST 51* 42*  ALT 7 6  ALKPHOS 67 57  BILITOT 1.1 1.4*  PROT 7.2 6.6  ALBUMIN 4.4 3.8   No results for input(s): LIPASE, AMYLASE in the last 168 hours. No  results for input(s): AMMONIA in the last 168 hours. CBC: Recent Labs  Lab 10/21/23 0936 10/22/23 0521 10/23/23 0507 10/24/23 0604 10/25/23 0442  WBC 14.7* 16.2* 11.5* 8.7 6.5  HGB  15.4 13.5 13.0 12.5* 12.0*  HCT 45.4 39.9 39.0 36.1* 36.1*  MCV 97.6 99.0 99.2 96.3 97.8  PLT 223 165 143* 149* 169   Cardiac Enzymes: No results for input(s): CKTOTAL, CKMB, CKMBINDEX, TROPONINI in the last 168 hours. BNP: BNP (last 3 results) Recent Labs    06/04/23 1707  BNP 172.4*    ProBNP (last 3 results) No results for input(s): PROBNP in the last 8760 hours.  CBG: No results for input(s): GLUCAP in the last 168 hours.     Signed:  Devaughn KATHEE Ban MD.  [facility] 10/26/2023, 10:11 AM

## 2023-10-26 NOTE — TOC Transition Note (Signed)
 Transition of Care [facility]) - Discharge Note   Patient Details  Name: Ian Gill MRN: [id number] Date of Birth: Dec 01, 1945  Transition of Care [facility]) CM/SW Contact:  Lauraine [name], LCSW Phone Number: 10/26/2023, 11:36 AM   Clinical Narrative:  Patient has orders to discharge to [facility] today. RN has already called report. [facility] has been arranged and he is 3rd on the list. No further concerns. CSW signing off.   Final next level of care: Skilled Nursing Facility Barriers to Discharge: Barriers Resolved   Patient Goals and CMS Choice            Discharge Placement   Existing PASRR number confirmed : 10/22/23          Patient chooses bed at: Other - please specify in the comment section below: ([facility]) Patient to be transferred to facility by: [facility] Name of family member notified: Thresa Farone Patient and family notified of of transfer: 10/26/23  Discharge Plan and Services Additional resources added to the After Visit Summary for                                       Social Drivers of Health (SDOH) Interventions SDOH Screenings   Food Insecurity: No Food Insecurity (10/21/2023)  Housing: Low Risk  (10/21/2023)  Transportation Needs: No Transportation Needs (10/21/2023)  Utilities: Not At Risk (10/21/2023)  Financial Resource Strain: Medium Risk (06/10/2023)   Received from [facility]  Social Connections: Socially Isolated (10/21/2023)  Tobacco Use: Medium Risk (10/21/2023)     Readmission Risk Interventions     No data to display

## 2023-10-26 NOTE — Progress Notes (Signed)
 Speech Language Pathology Treatment: Dysphagia  Patient Details Name: Ian Gill MRN: [id number] DOB: [birth year] Today's Date: 10/26/2023 Time: 9094-9064 SLP Time Calculation (min) (ACUTE ONLY): 30 min  Assessment / Plan / Recommendation Clinical Impression  Pt seen for follow up dysphagia intervention. Pt alert, feeding self with min assist for set up. Pt demonstrating intermittent throat clear/cough in the absence and presence of PO. Now on room air, afebrile, and WBC WNL. O2 saturations maintained at 94-97 for duration of trials. Pt seen with trials of puree, thin, and nectar thick liquids. Moderate impulsivity noted with straw/cup sips of liquids. Thin liquid resultant in immediate cough, though not consistently. Overtly pt demonstrates increased oral control of thickened liquids. Timely and efficient oral manipulation noted with puree solids.   Plan for potential discharge today. Overall pt demonstrating stability with current diet. Recommend nectar thick liquids and puree solids with aspiration precautions (slow rate, monitor impulsivity, small bites, elevated HOB, and alert for PO intake). Recommend follow up SLP services at next level of care, with potential need for OP MBSS. RN aware of recommendations .   HPI HPI: Ian Gill is a pleasant 78 y.o. male with medical history significant for Parkinson's disease, COPD, history of seizure disorder on antiseizure medication, anxiety/depression, history of stroke who was brought in to [facility] ED for altered mental status.  Patient had productive cough with yellow sputum no blood.  Patient had a fall at the facility yesterday with possible mild head injury but did not seek care at that time.  There is no complaint of fever, chills, nausea, vomiting, dysuria.  Patient has dementia and his history is limited due to his inability to provide accurate history.      SLP Plan  Continue with current plan of care (recommend follow up SLP serivces at  next level of care)          Recommendations  Diet recommendations: Dysphagia 1 (puree);Nectar-thick liquid Liquids provided via: Cup;Straw Medication Administration: Crushed with puree Supervision: Patient able to self feed;Intermittent supervision to cue for compensatory strategies Compensations: Minimize environmental distractions;Slow rate;Small sips/bites;Monitor for anterior loss Postural Changes and/or Swallow Maneuvers: Seated upright 90 degrees;Upright 30-60 min after meal                  Oral care BID;Staff/trained caregiver to provide oral care   Set up Supervision/Assistance Dysphagia, oropharyngeal phase (R13.12)     Continue with current plan of care (recommend follow up SLP serivces at next level of care)    [place] Ian Kugler Clapp, MS, CCC-SLP Speech Language Pathologist Rehab Services; [facility] [phone] (ascom)   [place] J Gill  10/26/2023, 10:43 AM

## 2023-10-26 NOTE — Care Management Important Message (Signed)
 Important Message  Patient Details  Name: Ian Gill MRN: [id number] Date of Birth: [birth year]   Important Message Given:  Yes - Medicare IM     Markeese Boyajian W, CMA 10/26/2023, 2:20 PM

## 2023-12-09 ENCOUNTER — Ambulatory Visit: Payer: Self-pay | Admitting: Urology

## 2023-12-23 ENCOUNTER — Ambulatory Visit: Admitting: Urology

## 2023-12-23 ENCOUNTER — Encounter: Payer: Self-pay | Admitting: Urology

## 2023-12-23 VITALS — BP 147/74 | HR 82 | Ht 71.0 in | Wt 205.0 lb

## 2023-12-23 DIAGNOSIS — N3941 Urge incontinence: Secondary | ICD-10-CM | POA: Diagnosis not present

## 2023-12-23 DIAGNOSIS — N401 Enlarged prostate with lower urinary tract symptoms: Secondary | ICD-10-CM | POA: Diagnosis not present

## 2023-12-23 DIAGNOSIS — R3914 Feeling of incomplete bladder emptying: Secondary | ICD-10-CM

## 2023-12-23 LAB — BLADDER SCAN AMB NON-IMAGING

## 2023-12-23 MED ORDER — FINASTERIDE 5 MG PO TABS: 5.0000 mg | ORAL_TABLET | Freq: Every day | ORAL | 11 refills | Status: AC

## 2023-12-23 NOTE — Progress Notes (Signed)
 12/23/2023 1:58 PM   Ian Gill [birth year] [id number]  Referring provider: Sadie Manna, MD [address] [facility] [place],  [place] [address]  Chief Complaint  Patient presents with   Follow-up   Urologic History: 1. BPH Incomplete bladder emptying, PVR 150-300 mL. Silodosin  8 mg daily.   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 Beta-3 agonist therapy, Myrbetriq  50 mg daily   HPI: Ian Gill is a 78 y.o. male presents for annual follow-up.  His son was with him today.  No significant changes since last year's visit Remains on silodosin  and Myrbetriq  States silodosin  is one of the most expensive medications.  They are unable to obtain through GoodRx or local pharmacy and have to go to through a contracted pharmacy at his assisted living facility No dysuria or gross hematuria No flank, abdominal or pelvic pain   PMH: Past Medical History:  Diagnosis Date   Anxiety    COPD (chronic obstructive pulmonary disease) (HCC)    Depression    Parkinson's disease (HCC)    Seizures (HCC)    Stroke [facility])     Surgical History: Past Surgical History:  Procedure Laterality Date   ANKLE CLOSED REDUCTION Left 09/11/2019   Procedure: CLOSED REDUCTION 2nd toe;  Surgeon: Ashley Soulier, DPM;  Location: [facility] ORS;  Service: Podiatry;  Laterality: Left;   ORIF ANKLE FRACTURE Right 09/11/2019   Procedure: OPEN REDUCTION INTERNAL FIXATION (ORIF) ANKLE FRACTURE;  Surgeon: Ashley Soulier, DPM;  Location: [facility] OR[facility];  Service: Podiatry;  Laterality: Right;   Shoulder surgery     SPINAL FUSION     cervical   TOTAL KNEE ARTHROPLASTY      Home Medications:  Allergies as of 12/23/2023       Reactions   Bupropion Other (See Comments)   Seizures Seizure seizures Seizure   Dilantin  [phenytoin  Sodium Extended]    Past issues with toxicity per pt's son   Iodinated Contrast Media Swelling         Medication List        Accurate as of December 23, 2023  1:58 PM. If you have any questions, ask your nurse or doctor.          STOP taking these medications    loperamide 2 MG capsule Commonly known as: IMODIUM Stopped by: Glendia JAYSON Barba       TAKE these medications    amLODipine  10 MG tablet Commonly known as: NORVASC  Take 10 mg by mouth daily.   atorvastatin  20 MG tablet Commonly known as: LIPITOR Take 1 tablet (20 mg total) by mouth daily.   carbidopa -levodopa  25-100 MG tablet Commonly known as: SINEMET  IR Take 3 tablets by mouth 3 (three) times daily.   Cerovite Senior Tabs Take by mouth. 0.4 mg-300 mg-250 mg   Cholecalciferol  125 MCG (5000 UT) capsule Take 5,000 Units by mouth daily.   clopidogrel  75 MG tablet Commonly known as: PLAVIX  Take 1 tablet (75 mg total) by mouth daily.   donepezil  5 MG tablet Commonly known as: ARICEPT  Take 5 mg by mouth daily.   folic acid  1 MG tablet Commonly known as: FOLVITE  Take 1 tablet (1 mg total) by mouth daily.   ibuprofen 200 MG tablet Commonly known as: ADVIL Take 200 mg by mouth every 8 (eight) hours as needed.   lamoTRIgine  150 MG tablet Commonly known as: LAMICTAL  Take 1 tablet by mouth 2 (two) times daily.   levETIRAcetam  750 MG tablet Commonly known  as: KEPPRA  Take 750 mg by mouth 2 (two) times daily.   lisinopril  20 MG tablet Commonly known as: ZESTRIL  Take 20 mg by mouth daily.   mirabegron  ER 50 MG Tb24 tablet Commonly known as: MYRBETRIQ  Take 1 tablet (50 mg total) by mouth daily.   propranolol  10 MG tablet Commonly known as: INDERAL  Take 0.5 tablets by mouth 2 (two) times daily.   RA Fish Oil 1000 MG Caps Take 1 capsule by mouth daily.   sertraline  100 MG tablet Commonly known as: ZOLOFT  Take 200 mg by mouth daily.   silodosin  8 MG Caps capsule Commonly known as: RAPAFLO  Take 1 capsule (8 mg total) by mouth daily with breakfast.   traZODone  50 MG tablet Commonly known as:  DESYREL  Take 50 mg by mouth at bedtime.        Allergies:  Allergies  Allergen Reactions   Bupropion Other (See Comments)    Seizures  Seizure seizures Seizure    Dilantin  [Phenytoin  Sodium Extended]     Past issues with toxicity per pt's son   Iodinated Contrast Media Swelling    Family History: Family History  Problem Relation Age of Onset   COPD Father     Social History:  reports that he has quit smoking. He has never used smokeless tobacco. He reports current alcohol use of about 3.0 standard drinks of alcohol per week. He reports that he does not use drugs.   Physical Exam: BP (!) 147/74   Pulse 82   Ht 5' 11 (1.803 m)   Wt 205 lb (93 kg)   BMI 28.59 kg/m   Constitutional:  Alert, No acute distress. HEENT: [place] AT Respiratory: Normal respiratory effort, no increased work of breathing.  Psychiatric: Normal mood and affect.   Assessment & Plan:    1.  BPH with LUTS PVR today 83 mL Continue silodosin  Will add finasteride  5 mg daily Follow-up 6 months and for recheck; due to silodosin  expense will try to discontinue at that time 1 year follow-up with PVR  2.  Urge incontinence Parkinson's and prior CVA Most likely secondary to neurogenic detrusor overactivity Continue Myrbetriq    Glendia JAYSON Barba, MD  [facility] [address], [address] [place], [place] [address] [phone]

## 2024-06-21 ENCOUNTER — Ambulatory Visit: Admitting: Physician Assistant
